# Patient Record
Sex: Male | Born: 1945 | Race: White | Hispanic: No | Marital: Married | Smoking: Current every day smoker
Health system: Southern US, Community
[De-identification: ages and names within clinical notes are randomized; demographics above are authoritative.]

## PROBLEM LIST (undated history)

## (undated) DIAGNOSIS — R160 Hepatomegaly, not elsewhere classified: Secondary | ICD-10-CM

## (undated) DIAGNOSIS — E785 Hyperlipidemia, unspecified: Secondary | ICD-10-CM

## (undated) DIAGNOSIS — F1910 Other psychoactive substance abuse, uncomplicated: Secondary | ICD-10-CM

## (undated) DIAGNOSIS — E119 Type 2 diabetes mellitus without complications: Secondary | ICD-10-CM

## (undated) DIAGNOSIS — I10 Essential (primary) hypertension: Secondary | ICD-10-CM

## (undated) HISTORY — DX: Other psychoactive substance abuse, uncomplicated: F19.10

## (undated) HISTORY — DX: Essential (primary) hypertension: I10

## (undated) HISTORY — DX: Hepatomegaly, not elsewhere classified: R16.0

## (undated) HISTORY — PX: CARDIAC SURGERY: SHX584

## (undated) HISTORY — DX: Type 2 diabetes mellitus without complications: E11.9

## (undated) HISTORY — DX: Hyperlipidemia, unspecified: E78.5

## (undated) HISTORY — PX: ANGIOPLASTY: SHX39

## (undated) HISTORY — PX: BONE MARROW TRANSPLANT: SHX200

## (undated) HISTORY — PX: APPENDECTOMY: SHX54

---

## 1980-05-05 HISTORY — PX: FRACTURE SURGERY: SHX138

## 2006-08-02 ENCOUNTER — Emergency Department: Payer: Self-pay | Admitting: Unknown Physician Specialty

## 2006-08-02 ENCOUNTER — Other Ambulatory Visit: Payer: Self-pay

## 2006-08-03 ENCOUNTER — Other Ambulatory Visit: Payer: Self-pay

## 2006-08-03 ENCOUNTER — Ambulatory Visit: Payer: Self-pay | Admitting: Unknown Physician Specialty

## 2008-12-05 ENCOUNTER — Emergency Department: Payer: Self-pay | Admitting: Emergency Medicine

## 2010-05-05 HISTORY — PX: COLONOSCOPY: SHX174

## 2010-10-11 ENCOUNTER — Ambulatory Visit: Payer: Self-pay | Admitting: Gastroenterology

## 2010-10-18 LAB — PATHOLOGY REPORT

## 2010-10-30 ENCOUNTER — Ambulatory Visit: Payer: Self-pay | Admitting: Gastroenterology

## 2013-12-27 ENCOUNTER — Ambulatory Visit: Payer: Self-pay | Admitting: Family Medicine

## 2014-06-05 DIAGNOSIS — Z125 Encounter for screening for malignant neoplasm of prostate: Secondary | ICD-10-CM | POA: Diagnosis not present

## 2014-06-05 DIAGNOSIS — Z0000 Encounter for general adult medical examination without abnormal findings: Secondary | ICD-10-CM | POA: Diagnosis not present

## 2014-06-05 DIAGNOSIS — I10 Essential (primary) hypertension: Secondary | ICD-10-CM | POA: Diagnosis not present

## 2014-06-05 DIAGNOSIS — E119 Type 2 diabetes mellitus without complications: Secondary | ICD-10-CM | POA: Diagnosis not present

## 2014-06-05 DIAGNOSIS — Z23 Encounter for immunization: Secondary | ICD-10-CM | POA: Diagnosis not present

## 2014-06-05 DIAGNOSIS — E785 Hyperlipidemia, unspecified: Secondary | ICD-10-CM | POA: Diagnosis not present

## 2014-10-23 ENCOUNTER — Other Ambulatory Visit: Payer: Self-pay | Admitting: Family Medicine

## 2014-11-20 DIAGNOSIS — E119 Type 2 diabetes mellitus without complications: Secondary | ICD-10-CM

## 2014-11-20 DIAGNOSIS — N183 Chronic kidney disease, stage 3 unspecified: Secondary | ICD-10-CM | POA: Insufficient documentation

## 2014-11-20 DIAGNOSIS — E1122 Type 2 diabetes mellitus with diabetic chronic kidney disease: Secondary | ICD-10-CM | POA: Insufficient documentation

## 2014-11-20 DIAGNOSIS — E785 Hyperlipidemia, unspecified: Secondary | ICD-10-CM | POA: Insufficient documentation

## 2014-11-20 DIAGNOSIS — I129 Hypertensive chronic kidney disease with stage 1 through stage 4 chronic kidney disease, or unspecified chronic kidney disease: Secondary | ICD-10-CM | POA: Insufficient documentation

## 2014-11-21 ENCOUNTER — Encounter: Payer: Self-pay | Admitting: Family Medicine

## 2014-11-21 ENCOUNTER — Ambulatory Visit (INDEPENDENT_AMBULATORY_CARE_PROVIDER_SITE_OTHER): Payer: Self-pay | Admitting: Family Medicine

## 2014-11-21 ENCOUNTER — Other Ambulatory Visit: Payer: Self-pay | Admitting: Family Medicine

## 2014-11-21 VITALS — BP 123/52 | HR 51 | Temp 98.2°F | Ht 70.0 in | Wt 182.0 lb

## 2014-11-21 DIAGNOSIS — E119 Type 2 diabetes mellitus without complications: Secondary | ICD-10-CM

## 2014-11-21 DIAGNOSIS — E785 Hyperlipidemia, unspecified: Secondary | ICD-10-CM | POA: Diagnosis not present

## 2014-11-21 DIAGNOSIS — I10 Essential (primary) hypertension: Secondary | ICD-10-CM

## 2014-11-21 DIAGNOSIS — Z0000 Encounter for general adult medical examination without abnormal findings: Secondary | ICD-10-CM | POA: Diagnosis not present

## 2014-11-21 MED ORDER — METFORMIN HCL 500 MG PO TABS: 250.0000 mg | ORAL_TABLET | Freq: Every day | ORAL | Status: DC

## 2014-11-21 MED ORDER — LISINOPRIL 40 MG PO TABS: 40.0000 mg | ORAL_TABLET | Freq: Every day | ORAL | Status: DC

## 2014-11-21 MED ORDER — METOPROLOL SUCCINATE ER 25 MG PO TB24: 25.0000 mg | ORAL_TABLET | Freq: Every day | ORAL | Status: DC

## 2014-11-21 MED ORDER — MELOXICAM 15 MG PO TABS: 15.0000 mg | ORAL_TABLET | Freq: Every day | ORAL | Status: DC

## 2014-11-21 MED ORDER — METFORMIN HCL ER (MOD) 500 MG PO TB24: 250.0000 mg | ORAL_TABLET | Freq: Every day | ORAL | Status: DC

## 2014-11-21 MED ORDER — CHLORTHALIDONE 25 MG PO TABS: 25.0000 mg | ORAL_TABLET | Freq: Every day | ORAL | Status: DC

## 2014-11-21 MED ORDER — AMLODIPINE BESYLATE 10 MG PO TABS: 10.0000 mg | ORAL_TABLET | Freq: Every day | ORAL | Status: DC

## 2014-11-21 NOTE — Assessment & Plan Note (Signed)
The current medical regimen is effective;  continue present plan and medications.  

## 2014-11-21 NOTE — Progress Notes (Signed)
BP 123/52 mmHg  Pulse 51  Temp(Src) 98.2 F (36.8 C)  Ht 5\' 10"  (1.778 m)  Wt 182 lb (82.555 kg)  BMI 26.11 kg/m2  SpO2 95%   Subjective:    Patient ID: William Ball, male    DOB: 19-Apr-1946, 69 y.o.   MRN: [id number]  HPI: William Ball is a 69 y.o. male  Chief Complaint  Patient presents with  . Diabetes  . Hypertension   patient recheck diabetes hypertension doing well no low blood sugar spells no issues with medications taking medications faithfully. Also checking lipids today A she is not on any medications Meloxicam doing okay for arthritis  Relevant past medical, surgical, family and social history reviewed and updated as indicated. Interim medical history since our last visit reviewed. Allergies and medications reviewed and updated.  Review of Systems  Constitutional: Negative.   Respiratory: Negative.   Cardiovascular: Negative.     Per HPI unless specifically indicated above     Objective:    BP 123/52 mmHg  Pulse 51  Temp(Src) 98.2 F (36.8 C)  Ht 5\' 10"  (1.778 m)  Wt 182 lb (82.555 kg)  BMI 26.11 kg/m2  SpO2 95%  Wt Readings from Last 3 Encounters:  11/21/14 182 lb (82.555 kg)  06/05/14 179 lb (81.194 kg)    Physical Exam  Constitutional: He is oriented to person, place, and time. He appears well-developed and well-nourished. No distress.  HENT:  Head: Normocephalic and atraumatic.  Right Ear: Hearing normal.  Left Ear: Hearing normal.  Nose: Nose normal.  Eyes: Conjunctivae and lids are normal. Right eye exhibits no discharge. Left eye exhibits no discharge. No scleral icterus.  Cardiovascular: Normal rate, regular rhythm and normal heart sounds.   Pulmonary/Chest: Effort normal and breath sounds normal. No respiratory distress.  Musculoskeletal: Normal range of motion.  Neurological: He is alert and oriented to person, place, and time.  Skin: Skin is intact. No rash noted.  Psychiatric: He has a normal mood and affect. His speech is  normal and behavior is normal. Judgment and thought content normal. Cognition and memory are normal.        Assessment & Plan:   Problem List Items Addressed This Visit      Cardiovascular and Mediastinum   Hypertension    The current medical regimen is effective;  continue present plan and medications.       Relevant Medications   amLODipine (NORVASC) 10 MG tablet   chlorthalidone (HYGROTON) 25 MG tablet   lisinopril (PRINIVIL,ZESTRIL) 40 MG tablet   metoprolol succinate (TOPROL-XL) 25 MG 24 hr tablet     Endocrine   Diabetes mellitus without complication - Primary    .....Marland KitchenMarland KitchenThe current medical regimen is effective;  continue present plan and medications.       Relevant Medications   lisinopril (PRINIVIL,ZESTRIL) 40 MG tablet   metFORMIN (GLUMETZA) 500 MG (MOD) 24 hr tablet   Other Relevant Orders   Bayer DCA Hb A1c Waived   Bayer DCA Hb A1c Waived     Other   Hyperlipidemia    The current medical regimen is effective;  continue present plan and medications.       Relevant Medications   amLODipine (NORVASC) 10 MG tablet   chlorthalidone (HYGROTON) 25 MG tablet   lisinopril (PRINIVIL,ZESTRIL) 40 MG tablet   metoprolol succinate (TOPROL-XL) 25 MG 24 hr tablet    Other Visit Diagnoses    Essential hypertension, benign  Relevant Medications    amLODipine (NORVASC) 10 MG tablet    chlorthalidone (HYGROTON) 25 MG tablet    lisinopril (PRINIVIL,ZESTRIL) 40 MG tablet    metoprolol succinate (TOPROL-XL) 25 MG 24 hr tablet    Other Relevant Orders    Basic metabolic panel    Hyperlipemia        Relevant Medications    amLODipine (NORVASC) 10 MG tablet    chlorthalidone (HYGROTON) 25 MG tablet    lisinopril (PRINIVIL,ZESTRIL) 40 MG tablet    metoprolol succinate (TOPROL-XL) 25 MG 24 hr tablet    Other Relevant Orders    Lipid Panel Piccolo, Waived    PE (physical exam), annual        Relevant Orders    Comprehensive metabolic panel    CBC with  Differential/Platelet    Urinalysis, Routine w reflex microscopic (not at [facility])    TSH    PSA    Lipid panel        Follow up plan: Return in about 6 months (around 05/24/2015) for Physical Exam.

## 2014-11-22 LAB — BASIC METABOLIC PANEL WITH GFR
BUN/Creatinine Ratio: 27 — ABNORMAL HIGH (ref 10–22)
BUN: 28 mg/dL — ABNORMAL HIGH (ref 8–27)
CO2: 21 mmol/L (ref 18–29)
Calcium: 9.9 mg/dL (ref 8.6–10.2)
Chloride: 101 mmol/L (ref 97–108)
Creatinine, Ser: 1.05 mg/dL (ref 0.76–1.27)
GFR calc Af Amer: 83 mL/min/1.73
GFR calc non Af Amer: 72 mL/min/1.73
Glucose: 147 mg/dL — ABNORMAL HIGH (ref 65–99)
Potassium: 4.5 mmol/L (ref 3.5–5.2)
Sodium: 140 mmol/L (ref 134–144)

## 2014-11-22 LAB — BAYER DCA HB A1C WAIVED: HB A1C (BAYER DCA - WAIVED): 6.4 %

## 2014-11-22 LAB — LIPID PANEL W/O CHOL/HDL RATIO
Cholesterol, Total: 150 mg/dL (ref 100–199)
HDL: 30 mg/dL — ABNORMAL LOW
LDL Calculated: 41 mg/dL (ref 0–99)
Triglycerides: 393 mg/dL — ABNORMAL HIGH (ref 0–149)
VLDL Cholesterol Cal: 79 mg/dL — ABNORMAL HIGH (ref 5–40)

## 2015-01-14 DIAGNOSIS — K469 Unspecified abdominal hernia without obstruction or gangrene: Secondary | ICD-10-CM | POA: Diagnosis not present

## 2015-01-14 DIAGNOSIS — R109 Unspecified abdominal pain: Secondary | ICD-10-CM | POA: Diagnosis not present

## 2015-01-22 ENCOUNTER — Ambulatory Visit (INDEPENDENT_AMBULATORY_CARE_PROVIDER_SITE_OTHER): Payer: Medicare Other | Admitting: Family Medicine

## 2015-01-22 ENCOUNTER — Encounter: Payer: Self-pay | Admitting: Family Medicine

## 2015-01-22 VITALS — BP 135/58 | HR 55 | Temp 98.2°F | Ht 70.0 in | Wt 178.0 lb

## 2015-01-22 DIAGNOSIS — K219 Gastro-esophageal reflux disease without esophagitis: Secondary | ICD-10-CM | POA: Diagnosis not present

## 2015-01-22 NOTE — Assessment & Plan Note (Signed)
Discuss with relief of symptoms with Prilosec and reflux is his diagnosis. Patient though may have some gallstone issues will observe for further abdominal pain complaints Discuss ventral hernia to leave it alone We'll continue Prilosec

## 2015-01-22 NOTE — Progress Notes (Signed)
BP 135/58 mmHg  Pulse 55  Temp(Src) 98.2 F (36.8 C)  Ht 5\' 10"  (1.778 m)  Wt 178 lb (80.74 kg)  BMI 25.54 kg/m2  SpO2 96%   Subjective:    Patient ID: William Ball, male    DOB: 02/06/1946, 69 y.o.   MRN: [id number]  HPI: William Ball is a 69 y.o. male  Chief Complaint  Patient presents with  . Hernia    Rt side    Patient for follow-up from fast med office visit for right upper quadrant abdominal pain is treated with Prilosec and is significantly better with no further symptoms. Reviewed patient's notes from past med indicating also a hernia. Patient with no nausea vomiting diarrhea no blood in stool or urine. Is feeling well at this point    Relevant past medical, surgical, family and social history reviewed and updated as indicated. Interim medical history since our last visit reviewed. Allergies and medications reviewed and updated.  Review of Systems  Constitutional: Negative.   Respiratory: Negative.   Cardiovascular: Negative.   Gastrointestinal: Negative for nausea, abdominal pain, diarrhea, constipation, blood in stool, abdominal distention and anal bleeding.    Per HPI unless specifically indicated above     Objective:    BP 135/58 mmHg  Pulse 55  Temp(Src) 98.2 F (36.8 C)  Ht 5\' 10"  (1.778 m)  Wt 178 lb (80.74 kg)  BMI 25.54 kg/m2  SpO2 96%  Wt Readings from Last 3 Encounters:  01/22/15 178 lb (80.74 kg)  11/21/14 182 lb (82.555 kg)  06/05/14 179 lb (81.194 kg)    Physical Exam  Constitutional: He is oriented to person, place, and time. He appears well-developed and well-nourished. No distress.  HENT:  Head: Normocephalic and atraumatic.  Right Ear: Hearing normal.  Left Ear: Hearing normal.  Nose: Nose normal.  Eyes: Conjunctivae and lids are normal. Right eye exhibits no discharge. Left eye exhibits no discharge. No scleral icterus.  Cardiovascular: Normal rate, regular rhythm and normal heart sounds.   Pulmonary/Chest: Effort normal  and breath sounds normal. No respiratory distress.  Abdominal: Soft. Bowel sounds are normal. He exhibits no distension and no mass. There is no tenderness. There is no rebound and no guarding.  Ventral hernia present painless only present with Valsalva maneuver  Musculoskeletal: Normal range of motion.  Neurological: He is alert and oriented to person, place, and time.  Skin: Skin is intact. No rash noted.  Psychiatric: He has a normal mood and affect. His speech is normal and behavior is normal. Judgment and thought content normal. Cognition and memory are normal.    Results for orders placed or performed in visit on 32/95/18  Basic metabolic panel  Result Value Ref Range   Glucose 147 (H) 65 - 99 mg/dL   BUN 28 (H) 8 - 27 mg/dL   Creatinine, Ser 1.05 0.76 - 1.27 mg/dL   GFR calc non Af Amer 72 >59 mL/min/1.73   GFR calc Af Amer 83 >59 mL/min/1.73   BUN/Creatinine Ratio 27 (H) 10 - 22   Sodium 140 134 - 144 mmol/L   Potassium 4.5 3.5 - 5.2 mmol/L   Chloride 101 97 - 108 mmol/L   CO2 21 18 - 29 mmol/L   Calcium 9.9 8.6 - 10.2 mg/dL  Bayer DCA Hb A1c Waived  Result Value Ref Range   Bayer DCA Hb A1c Waived 6.4 <7.0 %      Assessment & Plan:   Problem List Items Addressed This  Visit      Digestive   Gastroesophageal reflux - Primary    Discuss with relief of symptoms with Prilosec and reflux is his diagnosis. Patient though may have some gallstone issues will observe for further abdominal pain complaints Discuss ventral hernia to leave it alone We'll continue Prilosec          Follow up plan: Return if symptoms worsen or fail to improve, for For regularly scheduled visit.

## 2015-06-07 ENCOUNTER — Ambulatory Visit (INDEPENDENT_AMBULATORY_CARE_PROVIDER_SITE_OTHER): Payer: PPO | Admitting: Family Medicine

## 2015-06-07 ENCOUNTER — Encounter: Payer: Self-pay | Admitting: Family Medicine

## 2015-06-07 VITALS — BP 138/64 | HR 45 | Temp 97.7°F | Ht 69.4 in | Wt 176.0 lb

## 2015-06-07 DIAGNOSIS — M1610 Unilateral primary osteoarthritis, unspecified hip: Secondary | ICD-10-CM

## 2015-06-07 DIAGNOSIS — E119 Type 2 diabetes mellitus without complications: Secondary | ICD-10-CM | POA: Diagnosis not present

## 2015-06-07 DIAGNOSIS — Z0000 Encounter for general adult medical examination without abnormal findings: Secondary | ICD-10-CM

## 2015-06-07 DIAGNOSIS — I10 Essential (primary) hypertension: Secondary | ICD-10-CM

## 2015-06-07 DIAGNOSIS — N400 Enlarged prostate without lower urinary tract symptoms: Secondary | ICD-10-CM | POA: Insufficient documentation

## 2015-06-07 DIAGNOSIS — E785 Hyperlipidemia, unspecified: Secondary | ICD-10-CM | POA: Diagnosis not present

## 2015-06-07 DIAGNOSIS — Z113 Encounter for screening for infections with a predominantly sexual mode of transmission: Secondary | ICD-10-CM

## 2015-06-07 MED ORDER — AMLODIPINE BESYLATE 10 MG PO TABS: 10.0000 mg | ORAL_TABLET | Freq: Every day | ORAL | Status: DC

## 2015-06-07 MED ORDER — METOPROLOL SUCCINATE ER 25 MG PO TB24: 25.0000 mg | ORAL_TABLET | Freq: Every day | ORAL | Status: DC

## 2015-06-07 MED ORDER — LISINOPRIL 40 MG PO TABS: 40.0000 mg | ORAL_TABLET | Freq: Every day | ORAL | Status: DC

## 2015-06-07 MED ORDER — MELOXICAM 15 MG PO TABS: 15.0000 mg | ORAL_TABLET | Freq: Every day | ORAL | Status: DC

## 2015-06-07 MED ORDER — METFORMIN HCL 500 MG PO TABS: 250.0000 mg | ORAL_TABLET | Freq: Every day | ORAL | Status: DC

## 2015-06-07 NOTE — Assessment & Plan Note (Signed)
The current medical regimen is effective;  continue present plan and medications.  

## 2015-06-07 NOTE — Assessment & Plan Note (Signed)
The current medical regimen is effective;  continue present plan and medications. Discussed occasional holidays

## 2015-06-07 NOTE — Progress Notes (Signed)
BP 138/64 mmHg  Pulse 45  Temp(Src) 97.7 F (36.5 C)  Ht 5' 9.4" (1.763 m)  Wt 176 lb (79.833 kg)  BMI 25.68 kg/m2  SpO2 97%   Subjective:    Patient ID: William Ball, male    DOB: [birth year], 70 y.o.   MRN: [id number]  HPI: William Ball is a 70 y.o. male  Chief Complaint  Patient presents with  . Annual Exam   Patient doing well with blood pressure no complaints from medications having good control Blood sugar doing well with no low blood sugar spells no issues from metformin Patient with bilateral hip pain discomfort relieved by taking meloxicam which he takes every day no stomach distress or other issues. Relevant past medical, surgical, family and social history reviewed and updated as indicated. Interim medical history since our last visit reviewed. Allergies and medications reviewed and updated.  Review of Systems  Constitutional: Negative.   HENT: Negative.   Eyes: Negative.   Respiratory: Negative.   Cardiovascular: Negative.   Gastrointestinal: Negative.   Endocrine: Negative.   Genitourinary: Negative.   Musculoskeletal: Negative.   Skin: Negative.   Allergic/Immunologic: Negative.   Neurological: Negative.   Hematological: Negative.   Psychiatric/Behavioral: Negative.     Per HPI unless specifically indicated above     Objective:    BP 138/64 mmHg  Pulse 45  Temp(Src) 97.7 F (36.5 C)  Ht 5' 9.4" (1.763 m)  Wt 176 lb (79.833 kg)  BMI 25.68 kg/m2  SpO2 97%  Wt Readings from Last 3 Encounters:  06/07/15 176 lb (79.833 kg)  01/22/15 178 lb (80.74 kg)  11/21/14 182 lb (82.555 kg)    Physical Exam  Constitutional: He is oriented to person, place, and time. He appears well-developed and well-nourished.  HENT:  Head: Normocephalic and atraumatic.  Right Ear: External ear normal.  Left Ear: External ear normal.  Eyes: Conjunctivae and EOM are normal. Pupils are equal, round, and reactive to light.  Neck: Normal range of motion. Neck supple.   Cardiovascular: Normal rate, regular rhythm, normal heart sounds and intact distal pulses.   Pulmonary/Chest: Effort normal and breath sounds normal.  Abdominal: Soft. Bowel sounds are normal. There is no splenomegaly or hepatomegaly.  Genitourinary: Rectum normal and penis normal.  Enlarged prostate  Musculoskeletal: Normal range of motion.  Neurological: He is alert and oriented to person, place, and time. He has normal reflexes.  Skin: No rash noted. No erythema.  Psychiatric: He has a normal mood and affect. His behavior is normal. Judgment and thought content normal.    Results for orders placed or performed in visit on [id number]  Basic metabolic panel  Result Value Ref Range   Glucose 147 (H) 65 - 99 mg/dL   BUN 28 (H) 8 - 27 mg/dL   Creatinine, Ser 1.05 0.76 - 1.27 mg/dL   GFR calc non Af Amer 72 >59 mL/min/1.73   GFR calc Af Amer 83 >59 mL/min/1.73   BUN/Creatinine Ratio 27 (H) 10 - 22   Sodium 140 134 - 144 mmol/L   Potassium 4.5 3.5 - 5.2 mmol/L   Chloride 101 97 - 108 mmol/L   CO2 21 18 - 29 mmol/L   Calcium 9.9 8.6 - 10.2 mg/dL  Bayer DCA Hb A1c Waived  Result Value Ref Range   Bayer DCA Hb A1c Waived 6.4 <7.0 %      Assessment & Plan:   Problem List Items Addressed This Visit  Cardiovascular and Mediastinum   Hypertension    The current medical regimen is effective;  continue present plan and medications.       Relevant Medications   amLODipine (NORVASC) 10 MG tablet   lisinopril (PRINIVIL,ZESTRIL) 40 MG tablet   metoprolol succinate (TOPROL-XL) 25 MG 24 hr tablet     Endocrine   Diabetes mellitus without complication (HCC)    The current medical regimen is effective;  continue present plan and medications.       Relevant Medications   lisinopril (PRINIVIL,ZESTRIL) 40 MG tablet   metFORMIN (GLUCOPHAGE) 500 MG tablet   Other Relevant Orders   Bayer DCA Hb A1c Waived   Comprehensive metabolic panel   CBC with Differential/Platelet    Urinalysis, Routine w reflex microscopic (not at [facility])   TSH     Musculoskeletal and Integument   Hip arthritis    The current medical regimen is effective;  continue present plan and medications. Discussed occasional holidays      Relevant Medications   meloxicam (MOBIC) 15 MG tablet   Other Relevant Orders   TSH     Genitourinary   BPH (benign prostatic hyperplasia)   Relevant Orders   PSA     Other   Hyperlipidemia    The current medical regimen is effective;  continue present plan and medications.       Relevant Medications   amLODipine (NORVASC) 10 MG tablet   lisinopril (PRINIVIL,ZESTRIL) 40 MG tablet   metoprolol succinate (TOPROL-XL) 25 MG 24 hr tablet   Other Relevant Orders   Comprehensive metabolic panel   Lipid panel   CBC with Differential/Platelet   Urinalysis, Routine w reflex microscopic (not at [facility])   TSH    Other Visit Diagnoses    Routine screening for STI (sexually transmitted infection)    -  Primary    Relevant Orders    Hepatitis C Antibody    PE (physical exam), annual        Essential hypertension, benign        Relevant Medications    amLODipine (NORVASC) 10 MG tablet    lisinopril (PRINIVIL,ZESTRIL) 40 MG tablet    metoprolol succinate (TOPROL-XL) 25 MG 24 hr tablet    Other Relevant Orders    Comprehensive metabolic panel    CBC with Differential/Platelet    Urinalysis, Routine w reflex microscopic (not at [facility])    TSH        Follow up plan: Return in about 6 months (around 12/05/2015), or if symptoms worsen or fail to improve, for bmp and A1C.

## 2015-06-08 LAB — CBC WITH DIFFERENTIAL/PLATELET
Basophils Absolute: 0.1 x10E3/uL (ref 0.0–0.2)
Basos: 1 %
EOS (ABSOLUTE): 0.4 x10E3/uL (ref 0.0–0.4)
Eos: 6 %
Hematocrit: 43.2 % (ref 37.5–51.0)
Hemoglobin: 14.7 g/dL (ref 12.6–17.7)
Immature Grans (Abs): 0 x10E3/uL (ref 0.0–0.1)
Immature Granulocytes: 0 %
Lymphocytes Absolute: 2.9 x10E3/uL (ref 0.7–3.1)
Lymphs: 42 %
MCH: 31 pg (ref 26.6–33.0)
MCHC: 34 g/dL (ref 31.5–35.7)
MCV: 91 fL (ref 79–97)
Monocytes Absolute: 0.6 x10E3/uL (ref 0.1–0.9)
Monocytes: 9 %
Neutrophils Absolute: 2.9 x10E3/uL (ref 1.4–7.0)
Neutrophils: 42 %
Platelets: 281 x10E3/uL (ref 150–379)
RBC: 4.74 x10E6/uL (ref 4.14–5.80)
RDW: 13.3 % (ref 12.3–15.4)
WBC: 6.8 x10E3/uL (ref 3.4–10.8)

## 2015-06-08 LAB — URINALYSIS, ROUTINE W REFLEX MICROSCOPIC
Bilirubin, UA: NEGATIVE
Glucose, UA: NEGATIVE
Ketones, UA: NEGATIVE
Leukocytes, UA: NEGATIVE
Nitrite, UA: NEGATIVE
Protein, UA: NEGATIVE
RBC, UA: NEGATIVE
Specific Gravity, UA: 1.01 (ref 1.005–1.030)
Urobilinogen, Ur: 0.2 mg/dL (ref 0.2–1.0)
pH, UA: 7 (ref 5.0–7.5)

## 2015-06-08 LAB — COMPREHENSIVE METABOLIC PANEL WITH GFR
ALT: 48 IU/L — ABNORMAL HIGH (ref 0–44)
AST: 33 IU/L (ref 0–40)
Albumin/Globulin Ratio: 1.8 (ref 1.1–2.5)
Albumin: 4.7 g/dL (ref 3.5–4.8)
Alkaline Phosphatase: 75 IU/L (ref 39–117)
BUN/Creatinine Ratio: 20 (ref 10–22)
BUN: 23 mg/dL (ref 8–27)
Bilirubin Total: 0.4 mg/dL (ref 0.0–1.2)
CO2: 21 mmol/L (ref 18–29)
Calcium: 9.7 mg/dL (ref 8.6–10.2)
Chloride: 99 mmol/L (ref 96–106)
Creatinine, Ser: 1.13 mg/dL (ref 0.76–1.27)
GFR calc Af Amer: 76 mL/min/1.73
GFR calc non Af Amer: 65 mL/min/1.73
Globulin, Total: 2.6 g/dL (ref 1.5–4.5)
Glucose: 83 mg/dL (ref 65–99)
Potassium: 5 mmol/L (ref 3.5–5.2)
Sodium: 139 mmol/L (ref 134–144)
Total Protein: 7.3 g/dL (ref 6.0–8.5)

## 2015-06-08 LAB — LIPID PANEL
Chol/HDL Ratio: 3.9 ratio (ref 0.0–5.0)
Cholesterol, Total: 155 mg/dL (ref 100–199)
HDL: 40 mg/dL
LDL Calculated: 84 mg/dL (ref 0–99)
Triglycerides: 155 mg/dL — ABNORMAL HIGH (ref 0–149)
VLDL Cholesterol Cal: 31 mg/dL (ref 5–40)

## 2015-06-08 LAB — PSA: Prostate Specific Ag, Serum: 0.6 ng/mL (ref 0.0–4.0)

## 2015-06-08 LAB — HEPATITIS C ANTIBODY: Hep C Virus Ab: <0.1 {s_co_ratio} (ref 0.0–0.9)

## 2015-06-08 LAB — BAYER DCA HB A1C WAIVED: HB A1C (BAYER DCA - WAIVED): 6.5 %

## 2015-06-08 LAB — TSH: TSH: 4.51 u[IU]/mL — ABNORMAL HIGH (ref 0.450–4.500)

## 2015-06-11 ENCOUNTER — Encounter: Payer: Self-pay | Admitting: Family Medicine

## 2015-06-12 ENCOUNTER — Other Ambulatory Visit: Payer: Self-pay | Admitting: Family Medicine

## 2015-07-03 ENCOUNTER — Other Ambulatory Visit: Payer: Self-pay | Admitting: Family Medicine

## 2015-07-03 ENCOUNTER — Telehealth: Payer: Self-pay

## 2015-07-03 DIAGNOSIS — E119 Type 2 diabetes mellitus without complications: Secondary | ICD-10-CM

## 2015-07-03 MED ORDER — METFORMIN HCL ER 500 MG PO TB24: 250.0000 mg | ORAL_TABLET | Freq: Every day | ORAL | Status: DC

## 2015-07-03 NOTE — Telephone Encounter (Signed)
Apt in 6 mo

## 2015-07-03 NOTE — Telephone Encounter (Signed)
Pharmacy states historically patient has been on Metformin XR, please verify you wanted to change to regular Metformin  [facility]

## 2015-07-23 ENCOUNTER — Encounter: Payer: Self-pay | Admitting: Family Medicine

## 2015-07-23 NOTE — Telephone Encounter (Signed)
Letter sent for pt to call and schedule appt in September 2017.

## 2015-09-03 LAB — HM DIABETES EYE EXAM

## 2015-10-03 ENCOUNTER — Other Ambulatory Visit: Payer: Self-pay | Admitting: Family Medicine

## 2015-10-21 ENCOUNTER — Other Ambulatory Visit: Payer: Self-pay | Admitting: Family Medicine

## 2015-11-13 ENCOUNTER — Encounter: Payer: Self-pay | Admitting: *Deleted

## 2015-11-13 ENCOUNTER — Encounter
Admission: RE | Disposition: A | Discharge status: Home / Self Care | Payer: Self-pay | Source: Ambulatory Visit | Attending: Ophthalmology

## 2015-11-13 ENCOUNTER — Ambulatory Visit
Admission: RE | Admit: 2015-11-13 | Discharge: 2015-11-13 | Disposition: A | Discharge status: Home / Self Care | Payer: PPO | Source: Ambulatory Visit | Attending: Ophthalmology | Admitting: Ophthalmology

## 2015-11-13 ENCOUNTER — Ambulatory Visit: Payer: PPO | Admitting: Anesthesiology

## 2015-11-13 DIAGNOSIS — F17200 Nicotine dependence, unspecified, uncomplicated: Secondary | ICD-10-CM | POA: Insufficient documentation

## 2015-11-13 DIAGNOSIS — K219 Gastro-esophageal reflux disease without esophagitis: Secondary | ICD-10-CM | POA: Insufficient documentation

## 2015-11-13 DIAGNOSIS — E119 Type 2 diabetes mellitus without complications: Secondary | ICD-10-CM | POA: Diagnosis not present

## 2015-11-13 DIAGNOSIS — M1990 Unspecified osteoarthritis, unspecified site: Secondary | ICD-10-CM | POA: Diagnosis not present

## 2015-11-13 DIAGNOSIS — R05 Cough: Secondary | ICD-10-CM | POA: Insufficient documentation

## 2015-11-13 DIAGNOSIS — H2512 Age-related nuclear cataract, left eye: Secondary | ICD-10-CM | POA: Insufficient documentation

## 2015-11-13 HISTORY — PX: CATARACT EXTRACTION W/PHACO: SHX586

## 2015-11-13 LAB — GLUCOSE, CAPILLARY: Glucose-Capillary: 151 mg/dL — ABNORMAL HIGH (ref 65–99)

## 2015-11-13 SURGERY — CATARACT EXTRACTION PHACO AND INTRAOCULAR LENS PLACEMENT (IOC)
Anesthesia: Monitor Anesthesia Care | Site: Eye | Laterality: Left | Wound class: Clean

## 2015-11-13 MED ORDER — CARBACHOL 0.01 % IO SOLN
INTRAOCULAR | Status: DC | PRN
  Administered 2015-11-13: 0.5 mL via INTRAOCULAR

## 2015-11-13 MED ORDER — NA CHONDROIT SULF-NA HYALURON 40-17 MG/ML IO SOLN
INTRAOCULAR | Status: DC | PRN
  Administered 2015-11-13: 1 mL via INTRAOCULAR

## 2015-11-13 MED ORDER — MOXIFLOXACIN HCL 0.5 % OP SOLN: 1.0000 [drp] | OPHTHALMIC | Status: DC | PRN

## 2015-11-13 MED ORDER — ARMC OPHTHALMIC DILATING GEL
1.0000 "application " | OPHTHALMIC | Status: DC | PRN
  Administered 2015-11-13: 1 via OPHTHALMIC

## 2015-11-13 MED ORDER — EPINEPHRINE HCL 1 MG/ML IJ SOLN
INTRAMUSCULAR | Status: AC
  Filled 2015-11-13: qty 1

## 2015-11-13 MED ORDER — EPINEPHRINE HCL 1 MG/ML IJ SOLN
INTRAOCULAR | Status: DC | PRN
  Administered 2015-11-13: 07:00:00 via OPHTHALMIC

## 2015-11-13 MED ORDER — CEFUROXIME OPHTHALMIC INJECTION 1 MG/0.1 ML
INJECTION | OPHTHALMIC | Status: DC | PRN
  Administered 2015-11-13: 0.1 mL via INTRACAMERAL

## 2015-11-13 MED ORDER — FENTANYL CITRATE (PF) 100 MCG/2ML IJ SOLN
INTRAMUSCULAR | Status: DC | PRN
  Administered 2015-11-13: 50 ug via INTRAVENOUS

## 2015-11-13 MED ORDER — POVIDONE-IODINE 5 % OP SOLN
1.0000 "application " | Freq: Once | OPHTHALMIC | Status: AC
  Administered 2015-11-13: 1 via OPHTHALMIC

## 2015-11-13 MED ORDER — NA CHONDROIT SULF-NA HYALURON 40-17 MG/ML IO SOLN
INTRAOCULAR | Status: AC
  Filled 2015-11-13: qty 1

## 2015-11-13 MED ORDER — SODIUM CHLORIDE 0.9 % IV SOLN
INTRAVENOUS | Status: DC
  Administered 2015-11-13: 07:00:00 via INTRAVENOUS
  Administered 2015-11-13: 50 mL/h via INTRAVENOUS

## 2015-11-13 MED ORDER — FENTANYL CITRATE (PF) 100 MCG/2ML IJ SOLN: 25.0000 ug | INTRAMUSCULAR | Status: DC | PRN

## 2015-11-13 MED ORDER — CEFUROXIME OPHTHALMIC INJECTION 1 MG/0.1 ML
INJECTION | OPHTHALMIC | Status: AC
  Filled 2015-11-13: qty 0.1

## 2015-11-13 MED ORDER — ONDANSETRON HCL 4 MG/2ML IJ SOLN: 4.0000 mg | Freq: Once | INTRAMUSCULAR | Status: DC | PRN

## 2015-11-13 MED ORDER — MOXIFLOXACIN HCL 0.5 % OP SOLN
OPHTHALMIC | Status: DC | PRN
  Administered 2015-11-13: 1 [drp] via OPHTHALMIC

## 2015-11-13 MED ORDER — MIDAZOLAM HCL 2 MG/2ML IJ SOLN
INTRAMUSCULAR | Status: DC | PRN
  Administered 2015-11-13 (×2): 1 mg via INTRAVENOUS

## 2015-11-13 MED ORDER — POVIDONE-IODINE 5 % OP SOLN
OPHTHALMIC | Status: AC
  Administered 2015-11-13: 1 "application " via OPHTHALMIC
  Filled 2015-11-13: qty 30

## 2015-11-13 MED ORDER — ARMC OPHTHALMIC DILATING GEL
OPHTHALMIC | Status: AC
  Administered 2015-11-13: 1 "application " via OPHTHALMIC
  Filled 2015-11-13: qty 0.25

## 2015-11-13 MED ORDER — MOXIFLOXACIN HCL 0.5 % OP SOLN
OPHTHALMIC | Status: AC
  Filled 2015-11-13: qty 3

## 2015-11-13 MED ORDER — TETRACAINE HCL 0.5 % OP SOLN
OPHTHALMIC | Status: AC
  Administered 2015-11-13: 1 [drp] via OPHTHALMIC
  Filled 2015-11-13: qty 2

## 2015-11-13 MED ORDER — TETRACAINE HCL 0.5 % OP SOLN
1.0000 [drp] | Freq: Once | OPHTHALMIC | Status: AC
  Administered 2015-11-13: 1 [drp] via OPHTHALMIC

## 2015-11-13 SURGICAL SUPPLY — 19 items
CANNULA ANT/CHMB 27GA (MISCELLANEOUS) ×2 IMPLANT
CUP MEDICINE 2OZ PLAST GRAD ST (MISCELLANEOUS) ×2
GLOVE BIO SURGEON STRL SZ8 (GLOVE) ×2
GLOVE BIOGEL M 6.5 STRL (GLOVE) ×2
GLOVE SURG LX 8.0 MICRO (GLOVE) ×1
GLOVE SURG LX STRL 8.0 MICRO (GLOVE) ×1
GOWN STRL REUS W/ TWL LRG LVL3 (GOWN DISPOSABLE) ×2
GOWN STRL REUS W/TWL LRG LVL3 (GOWN DISPOSABLE) ×2
LENS IOL TECNIS ITEC 21.0 (Intraocular Lens) ×2 IMPLANT
PACK CATARACT (MISCELLANEOUS) ×2
PACK CATARACT BRASINGTON LX (MISCELLANEOUS) ×2
PACK EYE AFTER SURG (MISCELLANEOUS) ×2
SOL BSS BAG (MISCELLANEOUS) ×2
SOL PREP PVP 2OZ (MISCELLANEOUS) ×2
SYR 3ML LL SCALE MARK (SYRINGE) ×2
SYR 5ML LL (SYRINGE) ×2
SYR TB 1ML 27GX1/2 LL (SYRINGE) ×2
WATER STERILE IRR 1000ML POUR (IV SOLUTION) ×2
WIPE NON LINTING 3.25X3.25 (MISCELLANEOUS) ×2

## 2015-11-13 NOTE — Anesthesia Preprocedure Evaluation (Signed)
Anesthesia Evaluation  Patient identified by MRN, date of birth, ID band Patient awake    Reviewed: Allergy & Precautions, NPO status , Patient's Chart, lab work & pertinent test results  Airway Mallampati: II  TM Distance: >3 FB     Dental   Pulmonary Current Smoker,    Pulmonary exam normal        Cardiovascular hypertension, Pt. on medications Normal cardiovascular exam     Neuro/Psych negative neurological ROS  negative psych ROS   GI/Hepatic GERD  Medicated,  Endo/Other  diabetes, Well Controlled, Type 2  Renal/GU   negative genitourinary   Musculoskeletal  (+) Arthritis , Osteoarthritis,    Abdominal Normal abdominal exam  (+)   Peds negative pediatric ROS (+)  Hematology negative hematology ROS (+)   Anesthesia Other Findings   Reproductive/Obstetrics                             Anesthesia Physical Anesthesia Plan  ASA: II  Anesthesia Plan: MAC   Post-op Pain Management:    Induction: Intravenous  Airway Management Planned: Nasal Cannula  Additional Equipment:   Intra-op Plan:   Post-operative Plan:   Informed Consent: I have reviewed the patients History and Physical, chart, labs and discussed the procedure including the risks, benefits and alternatives for the proposed anesthesia with the patient or authorized representative who has indicated his/her understanding and acceptance.   Dental advisory given  Plan Discussed with: CRNA and Surgeon  Anesthesia Plan Comments:         Anesthesia Quick Evaluation

## 2015-11-13 NOTE — Transfer of Care (Signed)
Immediate Anesthesia Transfer of Care Note  Patient: William Ball  Procedure(s) Performed: Procedure(s) with comments: CATARACT EXTRACTION PHACO AND INTRAOCULAR LENS PLACEMENT (IOC) (Left) - [place] 1.06 AP% 23.9 CDE 15.81 Fluid pack lot # [id number]  Patient Location: PACU  Anesthesia Type:MAC  Level of Consciousness: awake, alert  and oriented  Airway & Oxygen Therapy: Patient Spontanous Breathing  Post-op Assessment: Report given to RN and Post -op Vital signs reviewed and stable  Post vital signs: Reviewed and stable  Last Vitals:  Filed Vitals:   11/13/15 0615  BP: 162/72  Pulse: 51  Temp: 35.6 C  Resp: 18    Last Pain: There were no vitals filed for this visit.    Patients Stated Pain Goal: 0 ([id number])  Complications: No apparent anesthesia complications

## 2015-11-13 NOTE — H&P (Signed)
  All labs reviewed. Abnormal studies sent to patients PCP when indicated.  Previous H&P reviewed, patient examined, there are NO CHANGES.  William Ball LOUIS7/11/20177:18 AM

## 2015-11-13 NOTE — Op Note (Signed)
PREOPERATIVE DIAGNOSIS:  Nuclear sclerotic cataract of the left eye.   POSTOPERATIVE DIAGNOSIS:  NUCLEAR SCLEROTIC CATARACT LEFT EYE   OPERATIVE PROCEDURE:  Procedure(s): CATARACT EXTRACTION PHACO AND INTRAOCULAR LENS PLACEMENT (IOC)   SURGEON:  [name], MD.   ANESTHESIA:   Anesthesiologist: Alvin Critchley, MD CRNA: Jenetta Downer, CRNA  1.      Managed anesthesia care. 2.      Topical tetracaine drops followed by 2% Xylocaine jelly applied in the preoperative holding area.   COMPLICATIONS:  None.   TECHNIQUE:   Stop and chop   DESCRIPTION OF PROCEDURE:  The patient was examined and consented in the preoperative holding area where the aforementioned topical anesthesia was applied to the left eye and then brought back to the Operating Room where the left eye was prepped and draped in the usual sterile ophthalmic fashion and a lid speculum was placed. A paracentesis was created with the side port blade and the anterior chamber was filled with viscoelastic. A near clear corneal incision was performed with the steel keratome. A continuous curvilinear capsulorrhexis was performed with a cystotome followed by the capsulorrhexis forceps. Hydrodissection and hydrodelineation were carried out with BSS on a blunt cannula. The lens was removed in a stop and chop  technique and the remaining cortical material was removed with the irrigation-aspiration handpiece. The capsular bag was inflated with viscoelastic and the Technis [id number] lens was placed in the capsular bag without complication. The remaining viscoelastic was removed from the eye with the irrigation-aspiration handpiece. The wounds were hydrated. The anterior chamber was flushed with Miostat and the eye was inflated to physiologic pressure. 0.1 mL of cefuroxime concentration 10 mg/mL was placed in the anterior chamber. The wounds were found to be water tight. The eye was dressed with Vigamox. The patient was given protective glasses  to wear throughout the day and a shield with which to sleep tonight. The patient was also given drops with which to begin a drop regimen today and will follow-up with me in one day.  Implant Name Type Inv. Item Serial No. Manufacturer Lot No. LRB No. Used  LENS IOL DIOP 21.0 - [id number] Intraocular Lens LENS IOL DIOP 21.0 [id number] AMO   Left 1   Procedure(s) with comments: CATARACT EXTRACTION PHACO AND INTRAOCULAR LENS PLACEMENT (IOC) (Left) - [place] 1.06 AP% 23.9 CDE 15.81 Fluid pack lot # [id number]  Electronically signed: [place] 11/13/2015 7:42 AM

## 2015-11-13 NOTE — Discharge Instructions (Signed)
Eye Surgery Discharge Instructions  Expect mild scratchy sensation or mild soreness. DO NOT RUB YOUR EYE!  The day of surgery:  Minimal physical activity, but bed rest is not required  No reading, computer work, or close hand work  No bending, lifting, or straining.  May watch TV  For 24 hours:  No driving, legal decisions, or alcoholic beverages  Safety precautions  Eat anything you prefer: It is better to start with liquids, then soup then solid foods.  _____ Eye patch should be worn until postoperative exam tomorrow.  ____ Solar shield eyeglasses should be worn for comfort in the sunlight/patch while sleeping  Resume all regular medications including aspirin or Coumadin if these were discontinued prior to surgery. You may shower, bathe, shave, or wash your hair. Tylenol may be taken for mild discomfort.  Call your doctor if you experience significant pain, nausea, or vomiting, fever > 101 or other signs of infection. [phone] or [phone] Specific instructions:  Follow-up Information    Follow up with Tim Lair, MD On 11/14/2015.   Specialty:  Ophthalmology   Why:  9:20   Contact information:   [address] [place] [address] [phone]

## 2015-11-14 NOTE — Anesthesia Postprocedure Evaluation (Signed)
Anesthesia Post Note  Patient: William Ball  Procedure(s) Performed: Procedure(s) (LRB): CATARACT EXTRACTION PHACO AND INTRAOCULAR LENS PLACEMENT (IOC) (Left)  Patient location during evaluation: PACU Anesthesia Type: MAC Level of consciousness: awake and alert and oriented Pain management: pain level controlled Vital Signs Assessment: post-procedure vital signs reviewed and stable Respiratory status: spontaneous breathing Cardiovascular status: blood pressure returned to baseline Anesthetic complications: no    Last Vitals:  Filed Vitals:   11/13/15 0755 11/13/15 0758  BP: 144/69 123/58  Pulse: 45 45  Temp: 36.6 C   Resp: 16 16    Last Pain:  Filed Vitals:   11/14/15 0814  PainSc: 0-No pain                 Deiondra Denley

## 2015-11-19 ENCOUNTER — Encounter: Payer: Self-pay | Admitting: *Deleted

## 2015-11-27 ENCOUNTER — Ambulatory Visit
Admission: RE | Admit: 2015-11-27 | Discharge: 2015-11-27 | Disposition: A | Discharge status: Home / Self Care | Payer: PPO | Source: Ambulatory Visit | Attending: Ophthalmology | Admitting: Ophthalmology

## 2015-11-27 ENCOUNTER — Ambulatory Visit: Payer: PPO | Admitting: Anesthesiology

## 2015-11-27 ENCOUNTER — Encounter
Admission: RE | Disposition: A | Discharge status: Home / Self Care | Payer: Self-pay | Source: Ambulatory Visit | Attending: Ophthalmology

## 2015-11-27 ENCOUNTER — Encounter: Payer: Self-pay | Admitting: *Deleted

## 2015-11-27 DIAGNOSIS — M1990 Unspecified osteoarthritis, unspecified site: Secondary | ICD-10-CM | POA: Diagnosis not present

## 2015-11-27 DIAGNOSIS — K219 Gastro-esophageal reflux disease without esophagitis: Secondary | ICD-10-CM | POA: Insufficient documentation

## 2015-11-27 DIAGNOSIS — F17200 Nicotine dependence, unspecified, uncomplicated: Secondary | ICD-10-CM | POA: Diagnosis not present

## 2015-11-27 DIAGNOSIS — E1136 Type 2 diabetes mellitus with diabetic cataract: Secondary | ICD-10-CM | POA: Insufficient documentation

## 2015-11-27 DIAGNOSIS — R05 Cough: Secondary | ICD-10-CM | POA: Diagnosis not present

## 2015-11-27 DIAGNOSIS — I10 Essential (primary) hypertension: Secondary | ICD-10-CM | POA: Diagnosis not present

## 2015-11-27 DIAGNOSIS — H2511 Age-related nuclear cataract, right eye: Secondary | ICD-10-CM | POA: Diagnosis present

## 2015-11-27 HISTORY — PX: CATARACT EXTRACTION W/PHACO: SHX586

## 2015-11-27 LAB — GLUCOSE, CAPILLARY: Glucose-Capillary: 114 mg/dL — ABNORMAL HIGH (ref 65–99)

## 2015-11-27 SURGERY — CATARACT EXTRACTION PHACO AND INTRAOCULAR LENS PLACEMENT (IOC)
Anesthesia: Monitor Anesthesia Care | Site: Eye | Laterality: Right | Wound class: Clean

## 2015-11-27 MED ORDER — CEFUROXIME OPHTHALMIC INJECTION 1 MG/0.1 ML
INJECTION | OPHTHALMIC | Status: AC
  Filled 2015-11-27: qty 0.1

## 2015-11-27 MED ORDER — ARMC OPHTHALMIC DILATING GEL
1.0000 "application " | OPHTHALMIC | Status: AC | PRN
  Administered 2015-11-27 (×2): 1 "application " via OPHTHALMIC

## 2015-11-27 MED ORDER — NA CHONDROIT SULF-NA HYALURON 40-17 MG/ML IO SOLN
INTRAOCULAR | Status: AC
  Filled 2015-11-27: qty 1

## 2015-11-27 MED ORDER — MOXIFLOXACIN HCL 0.5 % OP SOLN
OPHTHALMIC | Status: DC | PRN
  Administered 2015-11-27: 1 [drp] via OPHTHALMIC

## 2015-11-27 MED ORDER — FENTANYL CITRATE (PF) 100 MCG/2ML IJ SOLN
INTRAMUSCULAR | Status: DC | PRN
  Administered 2015-11-27: 50 ug via INTRAVENOUS

## 2015-11-27 MED ORDER — LIDOCAINE HCL (PF) 1 % IJ SOLN
INTRAMUSCULAR | Status: AC
  Filled 2015-11-27: qty 2

## 2015-11-27 MED ORDER — NA CHONDROIT SULF-NA HYALURON 40-17 MG/ML IO SOLN
INTRAOCULAR | Status: DC | PRN
  Administered 2015-11-27: 1 mL via INTRAOCULAR

## 2015-11-27 MED ORDER — POVIDONE-IODINE 5 % OP SOLN
1.0000 "application " | Freq: Once | OPHTHALMIC | Status: AC
  Administered 2015-11-27: 1 "application " via OPHTHALMIC

## 2015-11-27 MED ORDER — ATROPINE SULFATE 0.4 MG/ML IJ SOLN
INTRAMUSCULAR | Status: DC | PRN
  Administered 2015-11-27: .12 mg via INTRAVENOUS

## 2015-11-27 MED ORDER — MOXIFLOXACIN HCL 0.5 % OP SOLN: 1.0000 [drp] | OPHTHALMIC | Status: DC | PRN

## 2015-11-27 MED ORDER — CARBACHOL 0.01 % IO SOLN
INTRAOCULAR | Status: DC | PRN
  Administered 2015-11-27: 0.5 mL via INTRAOCULAR

## 2015-11-27 MED ORDER — POVIDONE-IODINE 5 % OP SOLN
OPHTHALMIC | Status: AC
  Filled 2015-11-27: qty 30

## 2015-11-27 MED ORDER — EPINEPHRINE HCL 1 MG/ML IJ SOLN
INTRAMUSCULAR | Status: AC
  Filled 2015-11-27: qty 1

## 2015-11-27 MED ORDER — TETRACAINE HCL 0.5 % OP SOLN
1.0000 [drp] | Freq: Once | OPHTHALMIC | Status: AC
  Administered 2015-11-27: 1 [drp] via OPHTHALMIC

## 2015-11-27 MED ORDER — MIDAZOLAM HCL 2 MG/2ML IJ SOLN
INTRAMUSCULAR | Status: DC | PRN
  Administered 2015-11-27: 1 mg via INTRAVENOUS

## 2015-11-27 MED ORDER — ARMC OPHTHALMIC DILATING GEL
OPHTHALMIC | Status: AC
  Filled 2015-11-27: qty 0.25

## 2015-11-27 MED ORDER — MOXIFLOXACIN HCL 0.5 % OP SOLN
OPHTHALMIC | Status: AC
  Filled 2015-11-27: qty 3

## 2015-11-27 MED ORDER — TETRACAINE HCL 0.5 % OP SOLN
OPHTHALMIC | Status: AC
  Filled 2015-11-27: qty 2

## 2015-11-27 MED ORDER — SODIUM CHLORIDE 0.9 % IV SOLN
INTRAVENOUS | Status: DC
  Administered 2015-11-27: 13:00:00 via INTRAVENOUS

## 2015-11-27 MED ORDER — CEFUROXIME OPHTHALMIC INJECTION 1 MG/0.1 ML
INJECTION | OPHTHALMIC | Status: DC | PRN
  Administered 2015-11-27: 0.1 mL via INTRACAMERAL

## 2015-11-27 MED ORDER — EPINEPHRINE HCL 1 MG/ML IJ SOLN
INTRAOCULAR | Status: DC | PRN
  Administered 2015-11-27: 1 mL via OPHTHALMIC

## 2015-11-27 SURGICAL SUPPLY — 19 items
CANNULA ANT/CHMB 27GA (MISCELLANEOUS) ×2 IMPLANT
CUP MEDICINE 2OZ PLAST GRAD ST (MISCELLANEOUS) ×2
GLOVE BIO SURGEON STRL SZ8 (GLOVE) ×2
GLOVE BIOGEL M 6.5 STRL (GLOVE) ×2
GLOVE SURG LX 8.0 MICRO (GLOVE) ×1
GLOVE SURG LX STRL 8.0 MICRO (GLOVE) ×1
GOWN STRL REUS W/ TWL LRG LVL3 (GOWN DISPOSABLE) ×2
GOWN STRL REUS W/TWL LRG LVL3 (GOWN DISPOSABLE) ×2
LENS IOL TECNIS ITEC 21.5 (Intraocular Lens) ×2 IMPLANT
PACK CATARACT (MISCELLANEOUS) ×2
PACK CATARACT BRASINGTON LX (MISCELLANEOUS) ×2
PACK EYE AFTER SURG (MISCELLANEOUS) ×2
SOL BSS BAG (MISCELLANEOUS) ×2
SOL PREP PVP 2OZ (MISCELLANEOUS) ×2
SYR 3ML LL SCALE MARK (SYRINGE) ×2
SYR 5ML LL (SYRINGE) ×2
SYR TB 1ML 27GX1/2 LL (SYRINGE) ×2
WATER STERILE IRR 1000ML POUR (IV SOLUTION) ×2
WIPE NON LINTING 3.25X3.25 (MISCELLANEOUS) ×2

## 2015-11-27 NOTE — H&P (Signed)
  All labs reviewed. Abnormal studies sent to patients PCP when indicated.  Previous H&P reviewed, patient examined, there are NO CHANGES.  William Ball LOUIS7/25/20172:36 PM

## 2015-11-27 NOTE — Anesthesia Postprocedure Evaluation (Signed)
Anesthesia Post Note  Patient: William Ball Anes Skeens  Procedure(s) Performed: Procedure(s) (LRB): CATARACT EXTRACTION PHACO AND INTRAOCULAR LENS PLACEMENT (IOC) (Right)  Patient location during evaluation: PACU Anesthesia Type: MAC Level of consciousness: awake, awake and alert, oriented and patient cooperative Pain management: pain level controlled Vital Signs Assessment: post-procedure vital signs reviewed and stable Respiratory status: spontaneous breathing Cardiovascular status: blood pressure returned to baseline Postop Assessment: no headache Anesthetic complications: no    Last Vitals:  Vitals:   11/27/15 1256  BP: (!) 157/67  Pulse: (!) 48  Resp: 16  Temp: 37.1 C    Last Pain:  Vitals:   11/27/15 1256  TempSrc: Oral                 Bernardo Heater

## 2015-11-27 NOTE — Discharge Instructions (Signed)

## 2015-11-27 NOTE — Transfer of Care (Signed)
Immediate Anesthesia Transfer of Care Note  Patient: Lillard Anes Marriott  Procedure(s) Performed: Procedure(s) with comments: CATARACT EXTRACTION PHACO AND INTRAOCULAR LENS PLACEMENT (IOC) (Right) - [place] 1.06 AP% 23.8 CDE 15.80 Fluid pack lot # [id number]  Patient Location: PACU  Anesthesia Type:MAC  Level of Consciousness: awake, alert , oriented and patient cooperative  Airway & Oxygen Therapy: Patient Spontanous Breathing  Post-op Assessment: Report given to RN and Post -op Vital signs reviewed and stable  Post vital signs: Reviewed and stable  Last Vitals:  Vitals:   11/27/15 1256  BP: (!) 157/67  Pulse: (!) 48  Resp: 16  Temp: 37.1 C    Last Pain:  Vitals:   11/27/15 1256  TempSrc: Oral         Complications: No apparent anesthesia complications

## 2015-11-27 NOTE — Anesthesia Preprocedure Evaluation (Signed)
Anesthesia Evaluation  Patient identified by MRN, date of birth, ID band Patient awake    Reviewed: Allergy & Precautions, NPO status , Patient's Chart, lab work & pertinent test results  Airway Mallampati: II  TM Distance: >3 FB Neck ROM: limited    Dental   Pulmonary Current Smoker,    Pulmonary exam normal breath sounds clear to auscultation       Cardiovascular hypertension, Pt. on medications Normal cardiovascular exam Rhythm:regular Rate:Normal     Neuro/Psych negative neurological ROS  negative psych ROS   GI/Hepatic GERD  Medicated,  Endo/Other  diabetes, Well Controlled, Type 2  Renal/GU   negative genitourinary   Musculoskeletal  (+) Arthritis , Osteoarthritis,    Abdominal Normal abdominal exam  (+)   Peds negative pediatric ROS (+)  Hematology negative hematology ROS (+)   Anesthesia Other Findings Past Medical History: No date: Cough     Comment: CHRONIC DRY No date: Diabetes mellitus without complication (HCC)     Comment: borderline No date: GERD (gastroesophageal reflux disease) No date: Hepatomegaly No date: Hyperlipidemia No date: Hypertension No date: Substance abuse   Reproductive/Obstetrics                             Anesthesia Physical  Anesthesia Plan  ASA: III  Anesthesia Plan: MAC   Post-op Pain Management:    Induction: Intravenous  Airway Management Planned: Nasal Cannula  Additional Equipment:   Intra-op Plan:   Post-operative Plan:   Informed Consent: I have reviewed the patients History and Physical, chart, labs and discussed the procedure including the risks, benefits and alternatives for the proposed anesthesia with the patient or authorized representative who has indicated his/her understanding and acceptance.   Dental advisory given  Plan Discussed with: CRNA and Surgeon  Anesthesia Plan Comments:         Anesthesia Quick  Evaluation

## 2015-11-27 NOTE — Op Note (Signed)
PREOPERATIVE DIAGNOSIS:  Nuclear sclerotic cataract of the right eye.   POSTOPERATIVE DIAGNOSIS: NUCLEAR SCLERTOIC CATARACT RIGHT EYE   OPERATIVE PROCEDURE:  Procedure(s): CATARACT EXTRACTION PHACO AND INTRAOCULAR LENS PLACEMENT (IOC)   SURGEON:  [name], MD.   ANESTHESIA:  Anesthesiologist: Andria Frames, MD CRNA: Bernardo Heater, CRNA  1.      Managed anesthesia care. 2.      Topical tetracaine drops followed by 2% Xylocaine jelly applied in the preoperative holding area.   COMPLICATIONS:  None.   TECHNIQUE:   Stop and chop   DESCRIPTION OF PROCEDURE:  The patient was examined and consented in the preoperative holding area where the aforementioned topical anesthesia was applied to the right eye and then brought back to the Operating Room where the right eye was prepped and draped in the usual sterile ophthalmic fashion and a lid speculum was placed. A paracentesis was created with the side port blade and the anterior chamber was filled with viscoelastic. A near clear corneal incision was performed with the steel keratome. A continuous curvilinear capsulorrhexis was performed with a cystotome followed by the capsulorrhexis forceps. Hydrodissection and hydrodelineation were carried out with BSS on a blunt cannula. The lens was removed in a stop and chop  technique and the remaining cortical material was removed with the irrigation-aspiration handpiece. The capsular bag was inflated with viscoelastic and the Technis [id number]  lens was placed in the capsular bag without complication. The remaining viscoelastic was removed from the eye with the irrigation-aspiration handpiece. The wounds were hydrated. The anterior chamber was flushed with Miostat and the eye was inflated to physiologic pressure. 0.1 mL of cefuroxime concentration 10 mg/mL was placed in the anterior chamber. The wounds were found to be water tight. The eye was dressed with Vigamox. The patient was given protective glasses  to wear throughout the day and a shield with which to sleep tonight. The patient was also given drops with which to begin a drop regimen today and will follow-up with me in one day.  Implant Name Type Inv. Item Serial No. Manufacturer Lot No. LRB No. Used  LENS IOL DIOP 21.5 - [id number] Intraocular Lens LENS IOL DIOP 21.5 [id number]  Right 1   Procedure(s) with comments: CATARACT EXTRACTION PHACO AND INTRAOCULAR LENS PLACEMENT (IOC) (Right) - [place] 1.06 AP% 23.8 CDE 15.80 Fluid pack lot # :[id number]  Electronically signed: [place] 11/27/2015 3:05 PM

## 2015-12-05 ENCOUNTER — Encounter: Payer: Self-pay | Admitting: Family Medicine

## 2015-12-05 ENCOUNTER — Ambulatory Visit (INDEPENDENT_AMBULATORY_CARE_PROVIDER_SITE_OTHER): Payer: PPO | Admitting: Family Medicine

## 2015-12-05 VITALS — BP 135/60 | HR 48 | Temp 97.9°F | Ht 70.7 in | Wt 181.0 lb

## 2015-12-05 DIAGNOSIS — M1610 Unilateral primary osteoarthritis, unspecified hip: Secondary | ICD-10-CM

## 2015-12-05 DIAGNOSIS — D692 Other nonthrombocytopenic purpura: Secondary | ICD-10-CM | POA: Insufficient documentation

## 2015-12-05 DIAGNOSIS — M1990 Unspecified osteoarthritis, unspecified site: Secondary | ICD-10-CM | POA: Diagnosis not present

## 2015-12-05 DIAGNOSIS — E119 Type 2 diabetes mellitus without complications: Secondary | ICD-10-CM

## 2015-12-05 DIAGNOSIS — I10 Essential (primary) hypertension: Secondary | ICD-10-CM | POA: Diagnosis not present

## 2015-12-05 DIAGNOSIS — I739 Peripheral vascular disease, unspecified: Secondary | ICD-10-CM

## 2015-12-05 DIAGNOSIS — Z23 Encounter for immunization: Secondary | ICD-10-CM

## 2015-12-05 DIAGNOSIS — F17210 Nicotine dependence, cigarettes, uncomplicated: Secondary | ICD-10-CM | POA: Insufficient documentation

## 2015-12-05 LAB — HEMOGLOBIN A1C: Hemoglobin A1C: 6.7

## 2015-12-05 LAB — BAYER DCA HB A1C WAIVED: HB A1C (BAYER DCA - WAIVED): 6.7 %

## 2015-12-05 NOTE — Assessment & Plan Note (Signed)
The current medical regimen is effective;  continue present plan and medications.  

## 2015-12-05 NOTE — Assessment & Plan Note (Signed)
Claudication symptoms left leg will refer to vascular to further evaluate Diminished pulses left foot but history of old trauma

## 2015-12-05 NOTE — Patient Instructions (Addendum)
Tdap Vaccine (Tetanus, Diphtheria and Pertussis): What You Need to Know 1. Why get vaccinated? Tetanus, diphtheria and pertussis are very serious diseases. Tdap vaccine can protect us from these diseases. And, Tdap vaccine given to pregnant women can protect newborn babies against pertussis. TETANUS (Lockjaw) is rare in the [place] today. It causes painful muscle tightening and stiffness, usually all over the body.  It can lead to tightening of muscles in the head and neck so you can't open your mouth, swallow, or sometimes even breathe. Tetanus kills about 1 out of 10 people who are infected even after receiving the best medical care. DIPHTHERIA is also rare in the [place] today. It can cause a thick coating to form in the back of the throat.  It can lead to breathing problems, heart failure, paralysis, and death. PERTUSSIS (Whooping Cough) causes severe coughing spells, which can cause difficulty breathing, vomiting and disturbed sleep.  It can also lead to weight loss, incontinence, and rib fractures. Up to 2 in 100 adolescents and 5 in 100 adults with pertussis are hospitalized or have complications, which could include pneumonia or death. These diseases are caused by bacteria. Diphtheria and pertussis are spread from person to person through secretions from coughing or sneezing. Tetanus enters the body through cuts, scratches, or wounds. Before vaccines, as many as 200,000 cases of diphtheria, 200,000 cases of pertussis, and hundreds of cases of tetanus, were reported in the [place] each year. Since vaccination began, reports of cases for tetanus and diphtheria have dropped by about 99% and for pertussis by about 80%. 2. Tdap vaccine Tdap vaccine can protect adolescents and adults from tetanus, diphtheria, and pertussis. One dose of Tdap is routinely given at age 11 or 12. People who did not get Tdap at that age should get it as soon as possible. Tdap is especially important  for healthcare professionals and anyone having close contact with a baby younger than 12 months. Pregnant women should get a dose of Tdap during every pregnancy, to protect the newborn from pertussis. Infants are most at risk for severe, life-threatening complications from pertussis. Another vaccine, called Td, protects against tetanus and diphtheria, but not pertussis. A Td booster should be given every 10 years. Tdap may be given as one of these boosters if you have never gotten Tdap before. Tdap may also be given after a severe cut or burn to prevent tetanus infection. Your doctor or the person giving you the vaccine can give you more information. Tdap may safely be given at the same time as other vaccines. 3. Some people should not get this vaccine  A person who has ever had a life-threatening allergic reaction after a previous dose of any diphtheria, tetanus or pertussis containing vaccine, OR has a severe allergy to any part of this vaccine, should not get Tdap vaccine. Tell the person giving the vaccine about any severe allergies.  Anyone who had coma or long repeated seizures within 7 days after a childhood dose of DTP or DTaP, or a previous dose of Tdap, should not get Tdap, unless a cause other than the vaccine was found. They can still get Td.  Talk to your doctor if you:  have seizures or another nervous system problem,  had severe pain or swelling after any vaccine containing diphtheria, tetanus or pertussis,  ever had a condition called Guillain-Barr Syndrome (GBS),  aren't feeling well on the day the shot is scheduled. 4. Risks With any medicine, including vaccines, there is   a chance of side effects. These are usually mild and go away on their own. Serious reactions are also possible but are rare. Most people who get Tdap vaccine do not have any problems with it. Mild problems following Tdap (Did not interfere with activities)  Pain where the shot was given (about 3 in 4  adolescents or 2 in 3 adults)  Redness or swelling where the shot was given (about 1 person in 5)  Mild fever of at least 100.4F (up to about 1 in 25 adolescents or 1 in 100 adults)  Headache (about 3 or 4 people in 10)  Tiredness (about 1 person in 3 or 4)  Nausea, vomiting, diarrhea, stomach ache (up to 1 in 4 adolescents or 1 in 10 adults)  Chills, sore joints (about 1 person in 10)  Body aches (about 1 person in 3 or 4)  Rash, swollen glands (uncommon) Moderate problems following Tdap (Interfered with activities, but did not require medical attention)  Pain where the shot was given (up to 1 in 5 or 6)  Redness or swelling where the shot was given (up to about 1 in 16 adolescents or 1 in 12 adults)  Fever over 102F (about 1 in 100 adolescents or 1 in 250 adults)  Headache (about 1 in 7 adolescents or 1 in 10 adults)  Nausea, vomiting, diarrhea, stomach ache (up to 1 or 3 people in 100)  Swelling of the entire arm where the shot was given (up to about 1 in 500). Severe problems following Tdap (Unable to perform usual activities; required medical attention)  Swelling, severe pain, bleeding and redness in the arm where the shot was given (rare). Problems that could happen after any vaccine:  People sometimes faint after a medical procedure, including vaccination. Sitting or lying down for about 15 minutes can help prevent fainting, and injuries caused by a fall. Tell your doctor if you feel dizzy, or have vision changes or ringing in the ears.  Some people get severe pain in the shoulder and have difficulty moving the arm where a shot was given. This happens very rarely.  Any medication can cause a severe allergic reaction. Such reactions from a vaccine are very rare, estimated at fewer than 1 in a million doses, and would happen within a few minutes to a few hours after the vaccination. As with any medicine, there is a very remote chance of a vaccine causing a serious  injury or death. The safety of vaccines is always being monitored. For more information, visit: www.cdc.gov/vaccinesafety/ 5. What if there is a serious problem? What should I look for?  Look for anything that concerns you, such as signs of a severe allergic reaction, very high fever, or unusual behavior.  Signs of a severe allergic reaction can include hives, swelling of the face and throat, difficulty breathing, a fast heartbeat, dizziness, and weakness. These would usually start a few minutes to a few hours after the vaccination. What should I do?  If you think it is a severe allergic reaction or other emergency that can't wait, call 9-1-1 or get the person to the nearest hospital. Otherwise, call your doctor.  Afterward, the reaction should be reported to the Vaccine Adverse Event Reporting System (VAERS). Your doctor might file this report, or you can do it yourself through the VAERS web site at www.vaers.hhs.gov, or by calling 1-800-822-7967. VAERS does not give medical advice.  6. The National Vaccine Injury Compensation Program The National Vaccine Injury Compensation Program (  VICP) is a federal program that was created to compensate people who may have been injured by certain vaccines. Persons who believe they may have been injured by a vaccine can learn about the program and about filing a claim by calling 1-800-338-2382 or visiting the VICP website at www.hrsa.gov/vaccinecompensation. There is a time limit to file a claim for compensation. 7. How can I learn more?  Ask your doctor. He or she can give you the vaccine package insert or suggest other sources of information.  Call your local or state health department.  Contact the Centers for Disease Control and Prevention (CDC):  Call 1-800-232-4636 (1-800-CDC-INFO) or  Visit CDC's website at www.cdc.gov/vaccines CDC Tdap Vaccine VIS (06/28/13)   This information is not intended to replace advice given to you by your health care  provider. Make sure you discuss any questions you have with your health care provider.   Document Released: 10/21/2011 Document Revised: 05/12/2014 Document Reviewed: 08/03/2013 Elsevier Interactive Patient Education 2016 Elsevier Inc.  

## 2015-12-05 NOTE — Progress Notes (Signed)
BP 135/60 (BP Location: Left Arm, Patient Position: Sitting, Cuff Size: Normal)   Pulse (!) 48   Temp 97.9 F (36.6 C)   Ht 5' 10.7" (1.796 m) Comment: with shoes  Wt 181 lb (82.1 kg) Comment: with shoes  SpO2 97%   BMI 25.46 kg/m    Subjective:    Patient ID: William Ball, male    DOB: 16-Feb-1946, 70 y.o.   MRN: [id number]  HPI: William Ball is a 70 y.o. male  Chief Complaint  Patient presents with  . Diabetes  Doing well with diabetes no complaints no problems noted low blood sugar spells or issues. Takes medications faithfully  Patient's biggest problem today is with walking even short distances gets bilateral hip pain but also significant discomfort in left quadriceps and hamstring muscles to the point he has to stop walking and rest then is able to go again. No leg cramping symptoms. This problems been ongoing for 10 years or so but getting worse to the point it's bothering him every day now.  Chronic senile purpura changes right forearm comes and goes but mostly comes no known trauma or irritation  Blood pressure doing good with no complaints from medications good control no side effects taking meds every day  Relevant past medical, surgical, family and social history reviewed and updated as indicated. Interim medical history since our last visit reviewed. Allergies and medications reviewed and updated.  Review of Systems  Constitutional: Negative.   Respiratory: Negative.   Cardiovascular: Negative.     Per HPI unless specifically indicated above     Objective:    BP 135/60 (BP Location: Left Arm, Patient Position: Sitting, Cuff Size: Normal)   Pulse (!) 48   Temp 97.9 F (36.6 C)   Ht 5' 10.7" (1.796 m) Comment: with shoes  Wt 181 lb (82.1 kg) Comment: with shoes  SpO2 97%   BMI 25.46 kg/m   Wt Readings from Last 3 Encounters:  12/05/15 181 lb (82.1 kg)  11/27/15 174 lb (78.9 kg)  11/13/15 173 lb (78.5 kg)    Physical Exam  Constitutional: He is  oriented to person, place, and time. He appears well-developed and well-nourished. No distress.  HENT:  Head: Normocephalic and atraumatic.  Right Ear: Hearing normal.  Left Ear: Hearing normal.  Nose: Nose normal.  Eyes: Conjunctivae and lids are normal. Right eye exhibits no discharge. Left eye exhibits no discharge. No scleral icterus.  Cardiovascular: Normal rate, regular rhythm and normal heart sounds.   Pulmonary/Chest: Effort normal and breath sounds normal. No respiratory distress.  Musculoskeletal: Normal range of motion.  Right foot strong anterior tibial diminished posterior tibial left foot no pulses palpated patient with multiple old trauma to his left leg from accident  Neurological: He is alert and oriented to person, place, and time.  Skin: Skin is intact. No rash noted.  Psychiatric: He has a normal mood and affect. His speech is normal and behavior is normal. Judgment and thought content normal. Cognition and memory are normal.    Results for orders placed or performed in visit on 12/05/15  Bayer DCA Hb A1c Waived  Result Value Ref Range   Bayer DCA Hb A1c Waived 6.7 <7.0 %  Hemoglobin A1c  Result Value Ref Range   Hemoglobin A1C 6.7       Assessment & Plan:   Problem List Items Addressed This Visit      Cardiovascular and Mediastinum   Hypertension    The current  medical regimen is effective;  continue present plan and medications.       Senile purpura ([place])     Endocrine   Diabetes mellitus without complication ([place]) - Primary    The current medical regimen is effective;  continue present plan and medications.       Relevant Orders   Bayer DCA Hb A1c Waived (Completed)     Musculoskeletal and Integument   Hip arthritis    Hip arthritis wants to try some arthritis medicine Reviewed patient already taking meloxicam Will add on Tylenol discuss if problems persist and after vascular workup will consider orthopedic referral.        Other    Claudication [facility])    Claudication symptoms left leg will refer to vascular to further evaluate Diminished pulses left foot but history of old trauma      Relevant Orders   Ambulatory referral to Vascular Surgery   Smoking greater than 40 pack years    Other Visit Diagnoses    Need for Tdap vaccination       Relevant Orders   Tdap vaccine greater than or equal to 7yo IM (Completed)       Follow up plan: Return in about 3 months (around 03/06/2016) for A1C, LIPIDS, ALT, AST, BMP.

## 2015-12-05 NOTE — Assessment & Plan Note (Addendum)
Hip arthritis wants to try some arthritis medicine Reviewed patient already taking meloxicam Will add on Tylenol discuss if problems persist and after vascular workup will consider orthopedic referral.

## 2015-12-20 ENCOUNTER — Other Ambulatory Visit: Payer: Self-pay | Admitting: Family Medicine

## 2015-12-25 ENCOUNTER — Other Ambulatory Visit: Payer: Self-pay | Admitting: Vascular Surgery

## 2015-12-26 ENCOUNTER — Other Ambulatory Visit
Admission: RE | Admit: 2015-12-26 | Discharge: 2015-12-26 | Disposition: A | Discharge status: Home / Self Care | Payer: PPO | Source: Ambulatory Visit | Attending: Vascular Surgery | Admitting: Vascular Surgery

## 2015-12-26 DIAGNOSIS — I739 Peripheral vascular disease, unspecified: Secondary | ICD-10-CM | POA: Diagnosis present

## 2015-12-26 LAB — CREATININE, SERUM
Creatinine, Ser: 1.15 mg/dL (ref 0.61–1.24)
GFR calc Af Amer: >60 mL/min
GFR calc non Af Amer: >60 mL/min

## 2015-12-26 LAB — BUN: BUN: 28 mg/dL — ABNORMAL HIGH (ref 6–20)

## 2015-12-27 ENCOUNTER — Ambulatory Visit
Admission: RE | Admit: 2015-12-27 | Discharge: 2015-12-27 | Disposition: A | Discharge status: Home / Self Care | Payer: PPO | Source: Ambulatory Visit | Attending: Vascular Surgery | Admitting: Vascular Surgery

## 2015-12-27 ENCOUNTER — Encounter: Payer: Self-pay | Admitting: *Deleted

## 2015-12-27 ENCOUNTER — Encounter
Admission: RE | Disposition: A | Discharge status: Home / Self Care | Payer: Self-pay | Source: Ambulatory Visit | Attending: Vascular Surgery

## 2015-12-27 DIAGNOSIS — Z7984 Long term (current) use of oral hypoglycemic drugs: Secondary | ICD-10-CM | POA: Diagnosis not present

## 2015-12-27 DIAGNOSIS — F1010 Alcohol abuse, uncomplicated: Secondary | ICD-10-CM | POA: Diagnosis not present

## 2015-12-27 DIAGNOSIS — Z7982 Long term (current) use of aspirin: Secondary | ICD-10-CM | POA: Diagnosis not present

## 2015-12-27 DIAGNOSIS — E119 Type 2 diabetes mellitus without complications: Secondary | ICD-10-CM | POA: Insufficient documentation

## 2015-12-27 DIAGNOSIS — Z809 Family history of malignant neoplasm, unspecified: Secondary | ICD-10-CM | POA: Diagnosis not present

## 2015-12-27 DIAGNOSIS — F17210 Nicotine dependence, cigarettes, uncomplicated: Secondary | ICD-10-CM | POA: Insufficient documentation

## 2015-12-27 DIAGNOSIS — I10 Essential (primary) hypertension: Secondary | ICD-10-CM | POA: Insufficient documentation

## 2015-12-27 DIAGNOSIS — I70213 Atherosclerosis of native arteries of extremities with intermittent claudication, bilateral legs: Secondary | ICD-10-CM | POA: Diagnosis present

## 2015-12-27 HISTORY — PX: PERIPHERAL VASCULAR CATHETERIZATION: SHX172C

## 2015-12-27 LAB — GLUCOSE, CAPILLARY: Glucose-Capillary: 135 mg/dL — ABNORMAL HIGH (ref 65–99)

## 2015-12-27 SURGERY — LOWER EXTREMITY ANGIOGRAPHY
Anesthesia: Moderate Sedation | Laterality: Left

## 2015-12-27 MED ORDER — FENTANYL CITRATE (PF) 100 MCG/2ML IJ SOLN
INTRAMUSCULAR | Status: AC
  Filled 2015-12-27: qty 2

## 2015-12-27 MED ORDER — HEPARIN (PORCINE) IN NACL 2-0.9 UNIT/ML-% IJ SOLN
INTRAMUSCULAR | Status: AC
  Filled 2015-12-27: qty 1000

## 2015-12-27 MED ORDER — FENTANYL CITRATE (PF) 100 MCG/2ML IJ SOLN
INTRAMUSCULAR | Status: DC | PRN
  Administered 2015-12-27 (×3): 50 ug via INTRAVENOUS

## 2015-12-27 MED ORDER — HEPARIN SODIUM (PORCINE) 1000 UNIT/ML IJ SOLN
INTRAMUSCULAR | Status: DC | PRN
  Administered 2015-12-27 (×2): 2500 [IU] via INTRAVENOUS

## 2015-12-27 MED ORDER — FAMOTIDINE 20 MG PO TABS: 40.0000 mg | ORAL_TABLET | ORAL | Status: DC | PRN

## 2015-12-27 MED ORDER — DEXTROSE 5 % IV SOLN: 1.5000 g | INTRAVENOUS | Status: DC

## 2015-12-27 MED ORDER — CLOPIDOGREL BISULFATE 75 MG PO TABS: 75.0000 mg | ORAL_TABLET | Freq: Every day | ORAL | 11 refills | Status: DC

## 2015-12-27 MED ORDER — ATORVASTATIN CALCIUM 20 MG PO TABS: 20.0000 mg | ORAL_TABLET | Freq: Every day | ORAL | 5 refills | Status: DC

## 2015-12-27 MED ORDER — MIDAZOLAM HCL 5 MG/5ML IJ SOLN
INTRAMUSCULAR | Status: AC
  Filled 2015-12-27: qty 5

## 2015-12-27 MED ORDER — MIDAZOLAM HCL 2 MG/2ML IJ SOLN
INTRAMUSCULAR | Status: DC | PRN
  Administered 2015-12-27: 2 mg via INTRAVENOUS
  Administered 2015-12-27: 1 mg via INTRAVENOUS
  Administered 2015-12-27: 2 mg via INTRAVENOUS

## 2015-12-27 MED ORDER — IOPAMIDOL (ISOVUE-300) INJECTION 61%
INTRAVENOUS | Status: DC | PRN
  Administered 2015-12-27: 85 mL via INTRAVENOUS

## 2015-12-27 MED ORDER — HEPARIN SODIUM (PORCINE) 1000 UNIT/ML IJ SOLN
INTRAMUSCULAR | Status: AC
  Filled 2015-12-27: qty 1

## 2015-12-27 MED ORDER — ONDANSETRON HCL 4 MG/2ML IJ SOLN: 4.0000 mg | Freq: Four times a day (QID) | INTRAMUSCULAR | Status: DC | PRN

## 2015-12-27 MED ORDER — METHYLPREDNISOLONE SODIUM SUCC 125 MG IJ SOLR: 125.0000 mg | INTRAMUSCULAR | Status: DC | PRN

## 2015-12-27 MED ORDER — LIDOCAINE-EPINEPHRINE (PF) 1 %-1:200000 IJ SOLN
INTRAMUSCULAR | Status: AC
  Filled 2015-12-27: qty 30

## 2015-12-27 MED ORDER — HYDROMORPHONE HCL 1 MG/ML IJ SOLN: 1.0000 mg | Freq: Once | INTRAMUSCULAR | Status: DC

## 2015-12-27 MED ORDER — SODIUM CHLORIDE 0.9 % IV SOLN
INTRAVENOUS | Status: DC
  Administered 2015-12-27: 12:00:00 via INTRAVENOUS

## 2015-12-27 SURGICAL SUPPLY — 17 items
BALLN DORADO 7X80X80 (BALLOONS) ×2
BALLN LUTONIX DCB 7X60X130 (BALLOONS) ×4
BALLN ULTRVRSE 5X60X75C (BALLOONS) ×2
CATH 5FR REUT (CATHETERS) ×2
CATH PIG 70CM (CATHETERS) ×2
CATH TORCON 5FR 0.38 (CATHETERS) ×2
DEVICE PRESTO INFLATION (MISCELLANEOUS) ×2
DEVICE STARCLOSE SE CLOSURE (Vascular Products) ×2 IMPLANT
GLIDECATH 4FR STR (CATHETERS) ×2
GLIDEWIRE ADV .035X260CM (WIRE) ×2
PACK ANGIOGRAPHY (CUSTOM PROCEDURE TRAY) ×2
SHEATH BRITE TIP 5FRX11 (SHEATH) ×2
SHEATH BRITE TIP 6FR X 23 (SHEATH) ×2
STENT LIFESTAR 8X80X80 (Permanent Stent) ×2 IMPLANT
SYR MEDRAD MARK V 150ML (SYRINGE) ×2
TUBING CONTRAST HIGH PRESS 72 (TUBING) ×2
WIRE J 3MM .035X145CM (WIRE) ×2

## 2015-12-27 NOTE — Op Note (Signed)
[facility] Percutaneous Study/Intervention Procedural Note   Date of Surgery: 12/27/2015  Surgeon(s):Rendon Howell   Assistants:none  Pre-operative Diagnosis: PAD with claudication  Post-operative diagnosis: Same  Procedure(s) Performed: 1. Ultrasound guidance for vascular access right femoral artery 2. Catheter placement into left SFA from right femoral approach 3. Aortogram and selective left lower extremity angiogram 4. Percutaneous transluminal angioplasty of right common iliac artery and proximal external iliac artery with 7 mm diameter x 6 cm length Lutonix drug-coated balloon 5. Percutaneous transluminal angioplasty of left distal common iliac artery and external iliac artery with 7 mm diameter by 6 cm length Lutonix drug-coated balloon  6.  Self-expanding stent placement to the left iliac arteries with 88m diameter by 8 cm length life Star stent 7. StarClose closure device right femoral artery  EBL: 25 cc  Contrast: 85 cc  Fluoro Time: 9.9 minutes  Moderate Conscious Sedation Time: approximately 40 minutes using 5 mg of Versed and 150 mcg of Fentanyl  Indications: Patient is a 70y.o.male with lifestyle limiting claudication. The patient has noninvasive study showing mildly reduced right ABI and a severely reduced left ABI. His left leg was worse than his right.. The patient is brought in for angiography for further evaluation and potential treatment. Risks and benefits are discussed and informed consent is obtained  Procedure: The patient was identified and appropriate procedural time out was performed. The patient was then placed supine on the table and prepped and draped in the usual sterile fashion.Moderate conscious sedation was administered during a face to face encounter with the patient throughout the procedure with my supervision of the RN  administering medicines and monitoring the patient's vital signs, pulse oximetry, telemetry and mental status throughout from the start of the procedure until the patient was taken to the recovery room. Ultrasound was used to evaluate the right common femoral artery. It was patent . A digital ultrasound image was acquired. A Seldinger needle was used to access the right common femoral artery under direct ultrasound guidance and a permanent image was performed. A 0.035 J wire was advanced without resistance and a 5Fr sheath was placed. Pigtail catheter was placed into the aorta and an AP aortogram was performed. This demonstrated normal left renal artery with mild stenosis of the right renal artery, and normal aorta and iliac segments with significant stenosis bilaterally. The right common iliac artery had about a 70-75% stenosis. The left common iliac artery had what appeared to be a moderate stenosis in the 60% range just above the bifurcation, but a few centimeters into the left external iliac artery was a high-grade near occlusive stenosis that was highly calcific. I then crossed the aortic bifurcation with a VS 1 catheter with a mild amount of difficulty and advanced to the left femoral head. With significant stored energy I had to advance a wire down into the distal SFA and was able to advance a 4 French glide catheter into the proximal SFA to evaluate the left lower extremity. Selective left lower extremity angiogram was then performed. This demonstrated normal common femoral artery, profunda femoris artery, and an SFA with only mild stenosis in the distal segment of about 20%. Distal runoff was difficult to opacify but there appeared to be two-vessel runoff. The high-grade near occlusive inflow stenosis made it difficult to see the distal runoff.. The patient was systemically heparinized and a 6 French 23 cm sheath was then placed over the Terumo Advantage wire. I treated the right common iliac artery  with a 7 mm diameter by 6 cm length Lutonix drug-coated angioplasty balloon. The distal extent of the balloon went in the first centimeter or 2 of the external iliac artery and the proximal extent of the balloon did not reach the aortic bifurcation. This was inflated to 10 atm for 1 minute. Completion angiogram showed about a 20-25% residual stenosis that was not flow limiting. I then addressed the left iliac artery. Again, a 7 mm diameter by 6 cm length Lutonix drug-coated angioplasty balloon was used to treat the iliac disease. 2 inflations were required to encompass the entirety of the disease. I actually predilated the highly calcific lesion with a 5 mm balloon initially but then inflated the 7 mm diameter drug-coated balloon to 12 atm for 1 minute. The balloon was pulled back to encompass the common iliac artery stenosis and inflated to 10 atm for 1 minute. There was still a greater than 50% residual stenosis after angioplasty in the left iliac system. I elected to place a self-expanding stent. I used an 8 mm diameter by 8 cm length self-expanding stent to cover the entirety of the area. This was postdilated with a 7 mm diameter high pressure balloon inflated to over 20 atm. Completion angiogram following this showed about a 20% residual stenosis in the common iliac artery and about a 30-35% residual stenosis in the external iliac artery, neither of which appeared flow limiting. I elected to terminate the procedure. The sheath was removed and StarClose closure device was deployed in the right femoral artery with excellent hemostatic result. The patient was taken to the recovery room in stable condition having tolerated the procedure well.  Findings:  Aortogram: This demonstrated normal left renal artery with mild stenosis of the right renal artery, and normal aorta and iliac segments with significant stenosis bilaterally. The right common iliac artery had about a 70-75% stenosis. The left  common iliac artery had what appeared to be a moderate stenosis in the 60% range just above the bifurcation, but a few centimeters into the left external iliac artery was a high-grade near occlusive stenosis that was highly calcific. Left Lower Extremity: Normal common femoral artery, profunda femoris artery, and an SFA with only mild stenosis in the distal segment of about 20%. Distal runoff was difficult to opacify but there appeared to be two-vessel runoff. The high-grade near occlusive inflow stenosis made it difficult to see the distal runoff.   Disposition: Patient was taken to the recovery room in stable condition having tolerated the procedure well.  Complications: None  Raylei Losurdo 12/27/2015 12:36 PM

## 2015-12-27 NOTE — H&P (Signed)
  [facility] History & Physical Update  The patient was interviewed and re-examined.  The patient's previous History and Physical has been reviewed and is unchanged.  There is no change in the plan of care. We plan to proceed with the scheduled procedure.  DEW,JASON, MD  12/27/2015, 9:42 AM

## 2015-12-27 NOTE — Progress Notes (Signed)
Patient tolerated PO liquids and solid without complication. Site is soft, warm with no swelling or bleeding noted.

## 2015-12-28 ENCOUNTER — Encounter: Payer: Self-pay | Admitting: Vascular Surgery

## 2016-01-07 ENCOUNTER — Other Ambulatory Visit: Payer: Self-pay | Admitting: Family Medicine

## 2016-03-10 ENCOUNTER — Encounter: Payer: Self-pay | Admitting: Family Medicine

## 2016-03-10 ENCOUNTER — Ambulatory Visit (INDEPENDENT_AMBULATORY_CARE_PROVIDER_SITE_OTHER): Payer: PPO | Admitting: Family Medicine

## 2016-03-10 VITALS — BP 136/68 | HR 64 | Temp 98.7°F | Wt 185.7 lb

## 2016-03-10 DIAGNOSIS — E785 Hyperlipidemia, unspecified: Secondary | ICD-10-CM

## 2016-03-10 DIAGNOSIS — E119 Type 2 diabetes mellitus without complications: Secondary | ICD-10-CM

## 2016-03-10 DIAGNOSIS — Z23 Encounter for immunization: Secondary | ICD-10-CM | POA: Diagnosis not present

## 2016-03-10 DIAGNOSIS — I10 Essential (primary) hypertension: Secondary | ICD-10-CM

## 2016-03-10 LAB — LP+ALT+AST PICCOLO, WAIVED
ALT (SGPT) Piccolo, Waived: 37 U/L (ref 10–47)
AST (SGOT) Piccolo, Waived: 42 U/L — ABNORMAL HIGH (ref 11–38)
Chol/HDL Ratio Piccolo,Waive: 2.9 mg/dL
Cholesterol Piccolo, Waived: 111 mg/dL
HDL Chol Piccolo, Waived: 39 mg/dL — ABNORMAL LOW
LDL Chol Calc Piccolo Waived: 2 mg/dL
Triglycerides Piccolo,Waived: 350 mg/dL — ABNORMAL HIGH
VLDL Chol Calc Piccolo,Waive: 70 mg/dL — ABNORMAL HIGH

## 2016-03-10 LAB — BAYER DCA HB A1C WAIVED: HB A1C (BAYER DCA - WAIVED): 7 % — ABNORMAL HIGH

## 2016-03-10 NOTE — Assessment & Plan Note (Signed)
The current medical regimen is effective;  continue present plan and medications.  

## 2016-03-10 NOTE — Progress Notes (Signed)
BP 136/68 (BP Location: Left Arm)   Pulse 64   Temp 98.7 F (37.1 C)   Wt 185 lb 11.2 oz (84.2 kg)   SpO2 95%   BMI 25.90 kg/m    Subjective:    Patient ID: William Ball, male    DOB: Jan 11, 1946, 70 y.o.   MRN: [id number]  HPI: William Ball is a 70 y.o. male  Chief Complaint  Patient presents with  . Diabetes  . Hyperlipidemia  . Hypertension   Patient follow-up has been switched to third shift over the last 4 days has been really tired and not sleeping well trying to adjust to the new change. had a tremor in his right hand yesterday never had tremor before hasn't had any since.  Diabetes no complaints of low blood sugar spells Blood pressure cholesterol no complaints from medications or conditions Takes medications faithfully with no side effects.  Relevant past medical, surgical, family and social history reviewed and updated as indicated. Interim medical history since our last visit reviewed. Allergies and medications reviewed and updated.  Review of Systems  Constitutional: Negative.   Respiratory: Negative.   Cardiovascular: Negative.     Per HPI unless specifically indicated above     Objective:    BP 136/68 (BP Location: Left Arm)   Pulse 64   Temp 98.7 F (37.1 C)   Wt 185 lb 11.2 oz (84.2 kg)   SpO2 95%   BMI 25.90 kg/m   Wt Readings from Last 3 Encounters:  03/10/16 185 lb 11.2 oz (84.2 kg)  12/27/15 181 lb (82.1 kg)  12/05/15 181 lb (82.1 kg)    Physical Exam  Constitutional: He is oriented to person, place, and time. He appears well-developed and well-nourished. No distress.  HENT:  Head: Normocephalic and atraumatic.  Right Ear: Hearing normal.  Left Ear: Hearing normal.  Nose: Nose normal.  Eyes: Conjunctivae and lids are normal. Right eye exhibits no discharge. Left eye exhibits no discharge. No scleral icterus.  Cardiovascular: Normal rate, regular rhythm and normal heart sounds.   Pulmonary/Chest: Effort normal and breath sounds  normal. No respiratory distress.  Musculoskeletal: Normal range of motion.  Neurological: He is alert and oriented to person, place, and time.  Skin: Skin is intact. No rash noted.  Psychiatric: He has a normal mood and affect. His speech is normal and behavior is normal. Judgment and thought content normal. Cognition and memory are normal.    Results for orders placed or performed during the hospital encounter of 12/27/15  Glucose, capillary  Result Value Ref Range   Glucose-Capillary 135 (H) 65 - 99 mg/dL      Assessment & Plan:   Problem List Items Addressed This Visit      Cardiovascular and Mediastinum   Hypertension    The current medical regimen is effective;  continue present plan and medications.         Endocrine   Diabetes mellitus without complication ([facility])    The current medical regimen is effective;  continue present plan and medications.       Relevant Orders   Bayer DCA Hb A1c Waived   Basic metabolic panel     Other   Hyperlipidemia - Primary    The current medical regimen is effective;  continue present plan and medications.       Relevant Orders   LP+ALT+AST Piccolo, Waived   Basic metabolic panel    Other Visit Diagnoses    Immunization due  Relevant Orders   Flu vaccine HIGH DOSE PF (Fluzone High dose) (Completed)       Follow up plan: Return in about 3 months (around 06/10/2016) for Physical Exam, Hemoglobin A1c.

## 2016-03-10 NOTE — Patient Instructions (Signed)

## 2016-03-11 LAB — BASIC METABOLIC PANEL WITH GFR
BUN/Creatinine Ratio: 16 (ref 10–24)
BUN: 22 mg/dL (ref 8–27)
CO2: 22 mmol/L (ref 18–29)
Calcium: 9.6 mg/dL (ref 8.6–10.2)
Chloride: 97 mmol/L (ref 96–106)
Creatinine, Ser: 1.39 mg/dL — ABNORMAL HIGH (ref 0.76–1.27)
GFR calc Af Amer: 59 mL/min/1.73 — ABNORMAL LOW
GFR calc non Af Amer: 51 mL/min/1.73 — ABNORMAL LOW
Glucose: 150 mg/dL — ABNORMAL HIGH (ref 65–99)
Potassium: 4.6 mmol/L (ref 3.5–5.2)
Sodium: 137 mmol/L (ref 134–144)

## 2016-03-20 ENCOUNTER — Encounter: Payer: Self-pay | Admitting: Family Medicine

## 2016-03-20 ENCOUNTER — Telehealth: Payer: Self-pay | Admitting: Family Medicine

## 2016-03-20 DIAGNOSIS — I10 Essential (primary) hypertension: Secondary | ICD-10-CM

## 2016-03-20 DIAGNOSIS — E119 Type 2 diabetes mellitus without complications: Secondary | ICD-10-CM

## 2016-03-20 NOTE — Telephone Encounter (Signed)
Phone call Discussed with patient's wife declining renal function patient will stop aspirin recheck BMP next office visit.

## 2016-03-20 NOTE — Telephone Encounter (Signed)
-----   Message from Moss Mc, [place] sent at 03/20/2016 11:44 AM EST ----- Phone call with wife

## 2016-06-09 ENCOUNTER — Encounter: Payer: PPO | Admitting: Family Medicine

## 2016-06-12 ENCOUNTER — Encounter: Payer: Self-pay | Admitting: Family Medicine

## 2016-06-12 ENCOUNTER — Ambulatory Visit (INDEPENDENT_AMBULATORY_CARE_PROVIDER_SITE_OTHER): Payer: Medicare HMO | Admitting: Family Medicine

## 2016-06-12 VITALS — BP 156/66 | HR 60 | Ht 70.87 in | Wt 184.0 lb

## 2016-06-12 DIAGNOSIS — R69 Illness, unspecified: Secondary | ICD-10-CM | POA: Diagnosis not present

## 2016-06-12 DIAGNOSIS — N400 Enlarged prostate without lower urinary tract symptoms: Secondary | ICD-10-CM

## 2016-06-12 DIAGNOSIS — F17210 Nicotine dependence, cigarettes, uncomplicated: Secondary | ICD-10-CM | POA: Diagnosis not present

## 2016-06-12 DIAGNOSIS — Z1211 Encounter for screening for malignant neoplasm of colon: Secondary | ICD-10-CM | POA: Diagnosis not present

## 2016-06-12 DIAGNOSIS — Z0000 Encounter for general adult medical examination without abnormal findings: Secondary | ICD-10-CM | POA: Diagnosis not present

## 2016-06-12 DIAGNOSIS — E119 Type 2 diabetes mellitus without complications: Secondary | ICD-10-CM | POA: Diagnosis not present

## 2016-06-12 DIAGNOSIS — Z1329 Encounter for screening for other suspected endocrine disorder: Secondary | ICD-10-CM

## 2016-06-12 DIAGNOSIS — M1610 Unilateral primary osteoarthritis, unspecified hip: Secondary | ICD-10-CM

## 2016-06-12 DIAGNOSIS — I739 Peripheral vascular disease, unspecified: Secondary | ICD-10-CM | POA: Diagnosis not present

## 2016-06-12 DIAGNOSIS — D692 Other nonthrombocytopenic purpura: Secondary | ICD-10-CM

## 2016-06-12 DIAGNOSIS — E785 Hyperlipidemia, unspecified: Secondary | ICD-10-CM

## 2016-06-12 DIAGNOSIS — I10 Essential (primary) hypertension: Secondary | ICD-10-CM | POA: Diagnosis not present

## 2016-06-12 LAB — BAYER DCA HB A1C WAIVED: HB A1C (BAYER DCA - WAIVED): 7 % — ABNORMAL HIGH

## 2016-06-12 LAB — URINALYSIS, ROUTINE W REFLEX MICROSCOPIC
Bilirubin, UA: NEGATIVE
Glucose, UA: NEGATIVE
Ketones, UA: NEGATIVE
Leukocytes, UA: NEGATIVE
Nitrite, UA: NEGATIVE
Protein, UA: NEGATIVE
RBC, UA: NEGATIVE
Specific Gravity, UA: 1.02 (ref 1.005–1.030)
Urobilinogen, Ur: 0.2 mg/dL (ref 0.2–1.0)
pH, UA: 5 (ref 5.0–7.5)

## 2016-06-12 MED ORDER — ATORVASTATIN CALCIUM 20 MG PO TABS: 20.0000 mg | ORAL_TABLET | Freq: Every day | ORAL | 4 refills | Status: DC

## 2016-06-12 MED ORDER — AMLODIPINE BESYLATE 10 MG PO TABS: 10.0000 mg | ORAL_TABLET | Freq: Every day | ORAL | 4 refills | Status: DC

## 2016-06-12 MED ORDER — METFORMIN HCL ER 500 MG PO TB24: 250.0000 mg | ORAL_TABLET | Freq: Every day | ORAL | 4 refills | Status: DC

## 2016-06-12 MED ORDER — MELOXICAM 15 MG PO TABS: 15.0000 mg | ORAL_TABLET | Freq: Every day | ORAL | 4 refills | Status: DC | PRN

## 2016-06-12 MED ORDER — CHLORTHALIDONE 25 MG PO TABS: 25.0000 mg | ORAL_TABLET | Freq: Every day | ORAL | 4 refills | Status: DC

## 2016-06-12 MED ORDER — LISINOPRIL 40 MG PO TABS: 40.0000 mg | ORAL_TABLET | Freq: Every day | ORAL | 4 refills | Status: DC

## 2016-06-12 MED ORDER — METOPROLOL SUCCINATE ER 50 MG PO TB24: 50.0000 mg | ORAL_TABLET | Freq: Every day | ORAL | 4 refills | Status: DC

## 2016-06-12 NOTE — Progress Notes (Signed)
BP (!) 156/66   Pulse 60   Ht 5' 10.87" (1.8 m)   Wt 184 lb (83.5 kg)   SpO2 98%   BMI 25.76 kg/m    Subjective:    Patient ID: William Ball, male    DOB: 14-Dec-1945, 71 y.o.   MRN: [id number]  HPI: William MATHIASON is a 71 y.o. male  Chief Complaint  Patient presents with  . Annual Exam  Patient's biggest concern today is having swelling of both legs feet which goes on during the day the longer he stands on the worse it gets. Is down overnight. Patient's working third shift. Is pretty much standing all night. This happens only on his right foot. Not his left. This is been ongoing pretty much all fall and winter. Was not going on prior to this. On review this problem started after having vascular surgery on his legs. And improve circulation especially in his right side.  Patient's blood pressure elevated today is been taking his medications faithfully and is taken today. Initially concerned about amlodipine and leg swelling but it's only his right leg.  Patient doing well with his cholesterol medicine taking faithfully.  Patient takes meloxicam for arthralgias which does okay. Metformin for diabetes seems to be doing okay no issues.  Patient's biggest problem is continues to smoke. He does remember problems with his legs and smoking cessation discussions from previous doctors and visits. Patient thinks it may be old. Here later this winter or early spring.  Relevant past medical, surgical, family and social history reviewed and updated as indicated. Interim medical history since our last visit reviewed. Allergies and medications reviewed and updated.  Other than above Review of Systems  Constitutional: Negative.   HENT: Negative.   Eyes: Negative.   Respiratory: Negative.   Cardiovascular: Negative.   Gastrointestinal: Negative.   Endocrine: Negative.   Genitourinary: Negative.   Musculoskeletal: Negative.   Skin: Negative.   Allergic/Immunologic: Negative.     Neurological: Negative.   Hematological: Negative.   Psychiatric/Behavioral: Negative.     Per HPI unless specifically indicated above     Objective:    BP (!) 156/66   Pulse 60   Ht 5' 10.87" (1.8 m)   Wt 184 lb (83.5 kg)   SpO2 98%   BMI 25.76 kg/m   Wt Readings from Last 3 Encounters:  06/12/16 184 lb (83.5 kg)  03/10/16 185 lb 11.2 oz (84.2 kg)  12/27/15 181 lb (82.1 kg)    Physical Exam  Constitutional: He is oriented to person, place, and time. He appears well-developed and well-nourished.  HENT:  Head: Normocephalic and atraumatic.  Right Ear: External ear normal.  Left Ear: External ear normal.  Eyes: Conjunctivae and EOM are normal. Pupils are equal, round, and reactive to light.  Neck: Normal range of motion. Neck supple.  Cardiovascular: Normal rate, regular rhythm, normal heart sounds and intact distal pulses.   Pulmonary/Chest: Effort normal and breath sounds normal.  Abdominal: Soft. Bowel sounds are normal. There is no splenomegaly or hepatomegaly.  Genitourinary: Rectum normal and penis normal.  Genitourinary Comments: BPH   Musculoskeletal: Normal range of motion.  Neurological: He is alert and oriented to person, place, and time. He has normal reflexes.  Skin: No rash noted. No erythema.  Psychiatric: He has a normal mood and affect. His behavior is normal. Judgment and thought content normal.    Results for orders placed or performed in visit on 03/10/16  Bayer DCA Hb A1c  Waived  Result Value Ref Range   Bayer DCA Hb A1c Waived 7.0 (H) <7.0 %  LP+ALT+AST Piccolo, Waived  Result Value Ref Range   ALT (SGPT) Piccolo, Waived 37 10 - 47 U/L   AST (SGOT) Piccolo, Waived 42 (H) 11 - 38 U/L   Cholesterol Piccolo, Waived 111 <200 mg/dL   HDL Chol Piccolo, Waived 39 (L) >59 mg/dL   Triglycerides Piccolo,Waived 350 (H) <150 mg/dL   Chol/HDL Ratio Piccolo,Waive 2.9 mg/dL   LDL Chol Calc Piccolo Waived 2 <100 mg/dL   VLDL Chol Calc Piccolo,Waive 70 (H)  <30 mg/dL  Basic metabolic panel  Result Value Ref Range   Glucose 150 (H) 65 - 99 mg/dL   BUN 22 8 - 27 mg/dL   Creatinine, Ser 1.39 (H) 0.76 - 1.27 mg/dL   GFR calc non Af Amer 51 (L) >59 mL/min/1.73   GFR calc Af Amer 59 (L) >59 mL/min/1.73   BUN/Creatinine Ratio 16 10 - 24   Sodium 137 134 - 144 mmol/L   Potassium 4.6 3.5 - 5.2 mmol/L   Chloride 97 96 - 106 mmol/L   CO2 22 18 - 29 mmol/L   Calcium 9.6 8.6 - 10.2 mg/dL      Assessment & Plan:   Problem List Items Addressed This Visit      Cardiovascular and Mediastinum   Hypertension    Discussed hypertension poor control patient taking medications faithfully Will increase metoprolol from 25 to 50 mg with cautions about low pulse rate. Will follow-up in about a month or so to reassess blood pressure control and pulse rate side effects.      Relevant Medications   metoprolol succinate (TOPROL-XL) 50 MG 24 hr tablet   amLODipine (NORVASC) 10 MG tablet   atorvastatin (LIPITOR) 20 MG tablet   chlorthalidone (HYGROTON) 25 MG tablet   lisinopril (PRINIVIL,ZESTRIL) 40 MG tablet   Other Relevant Orders   CBC with Differential/Platelet   Comprehensive metabolic panel   Lipid panel   Urinalysis, Routine w reflex microscopic   Bayer DCA Hb A1c Waived   Senile purpura (HCC)    Still on going but not worse      Relevant Medications   metoprolol succinate (TOPROL-XL) 50 MG 24 hr tablet   amLODipine (NORVASC) 10 MG tablet   atorvastatin (LIPITOR) 20 MG tablet   chlorthalidone (HYGROTON) 25 MG tablet   lisinopril (PRINIVIL,ZESTRIL) 40 MG tablet     Endocrine   Diabetes mellitus without complication (HCC)    The current medical regimen is effective;  continue present plan and medications.       Relevant Medications   atorvastatin (LIPITOR) 20 MG tablet   lisinopril (PRINIVIL,ZESTRIL) 40 MG tablet   metFORMIN (GLUCOPHAGE-XR) 500 MG 24 hr tablet   Other Relevant Orders   CBC with Differential/Platelet   Comprehensive  metabolic panel   Lipid panel   Urinalysis, Routine w reflex microscopic   Bayer DCA Hb A1c Waived     Musculoskeletal and Integument   Hip arthritis   Relevant Medications   meloxicam (MOBIC) 15 MG tablet     Genitourinary   BPH (benign prostatic hyperplasia)   Relevant Orders   PSA     Other   Hyperlipidemia    The current medical regimen is effective;  continue present plan and medications.       Relevant Medications   metoprolol succinate (TOPROL-XL) 50 MG 24 hr tablet   amLODipine (NORVASC) 10 MG tablet   atorvastatin (LIPITOR) 20  MG tablet   chlorthalidone (HYGROTON) 25 MG tablet   lisinopril (PRINIVIL,ZESTRIL) 40 MG tablet   Other Relevant Orders   CBC with Differential/Platelet   Comprehensive metabolic panel   Lipid panel   Urinalysis, Routine w reflex microscopic   Bayer DCA Hb A1c Waived   [facility])    Doing better after vascular intervention now with edema right leg discussed support hose and compression elevation etc.      Smoking greater than 40 pack years    Discussed CT screening will make referral.      Relevant Orders   CT CHEST LUNG CA SCREEN LOW DOSE W/O CM    Other Visit Diagnoses    Annual physical exam    -  Primary   Relevant Orders   CBC with Differential/Platelet   Comprehensive metabolic panel   Lipid panel   PSA   TSH   Urinalysis, Routine w reflex microscopic   Bayer DCA Hb A1c Waived   Thyroid disorder screen       Relevant Orders   TSH   Colon cancer screening       Relevant Orders   Ambulatory referral to Gastroenterology   Essential hypertension, benign       Relevant Medications   metoprolol succinate (TOPROL-XL) 50 MG 24 hr tablet   amLODipine (NORVASC) 10 MG tablet   atorvastatin (LIPITOR) 20 MG tablet   chlorthalidone (HYGROTON) 25 MG tablet   lisinopril (PRINIVIL,ZESTRIL) 40 MG tablet       Follow up plan: Return for BP check.

## 2016-06-12 NOTE — Assessment & Plan Note (Signed)
Discussed CT screening will make referral.

## 2016-06-12 NOTE — Assessment & Plan Note (Signed)
Doing better after vascular intervention now with edema right leg discussed support hose and compression elevation etc.

## 2016-06-12 NOTE — Assessment & Plan Note (Signed)
Discussed hypertension poor control patient taking medications faithfully Will increase metoprolol from 25 to 50 mg with cautions about low pulse rate. Will follow-up in about a month or so to reassess blood pressure control and pulse rate side effects.

## 2016-06-12 NOTE — Assessment & Plan Note (Signed)
Still on going but not worse

## 2016-06-12 NOTE — Assessment & Plan Note (Signed)
The current medical regimen is effective;  continue present plan and medications.  

## 2016-06-13 ENCOUNTER — Other Ambulatory Visit (INDEPENDENT_AMBULATORY_CARE_PROVIDER_SITE_OTHER): Payer: Self-pay | Admitting: Vascular Surgery

## 2016-06-13 DIAGNOSIS — E785 Hyperlipidemia, unspecified: Secondary | ICD-10-CM

## 2016-06-13 LAB — LIPID PANEL
Chol/HDL Ratio: 3.5 ratio (ref 0.0–5.0)
Cholesterol, Total: 111 mg/dL (ref 100–199)
HDL: 32 mg/dL — ABNORMAL LOW
LDL Calculated: 18 mg/dL (ref 0–99)
Triglycerides: 306 mg/dL — ABNORMAL HIGH (ref 0–149)
VLDL Cholesterol Cal: 61 mg/dL — ABNORMAL HIGH (ref 5–40)

## 2016-06-13 LAB — CBC WITH DIFFERENTIAL/PLATELET
Basophils Absolute: 0.1 x10E3/uL (ref 0.0–0.2)
Basos: 1 %
EOS (ABSOLUTE): 0.5 x10E3/uL — ABNORMAL HIGH (ref 0.0–0.4)
Eos: 8 %
Hematocrit: 41.1 % (ref 37.5–51.0)
Hemoglobin: 13.5 g/dL (ref 13.0–17.7)
Immature Grans (Abs): 0 x10E3/uL (ref 0.0–0.1)
Immature Granulocytes: 1 %
Lymphocytes Absolute: 2.2 x10E3/uL (ref 0.7–3.1)
Lymphs: 33 %
MCH: 30.7 pg (ref 26.6–33.0)
MCHC: 32.8 g/dL (ref 31.5–35.7)
MCV: 93 fL (ref 79–97)
Monocytes Absolute: 0.7 x10E3/uL (ref 0.1–0.9)
Monocytes: 11 %
Neutrophils Absolute: 3.1 x10E3/uL (ref 1.4–7.0)
Neutrophils: 46 %
Platelets: 243 x10E3/uL (ref 150–379)
RBC: 4.4 x10E6/uL (ref 4.14–5.80)
RDW: 13 % (ref 12.3–15.4)
WBC: 6.6 x10E3/uL (ref 3.4–10.8)

## 2016-06-13 LAB — COMPREHENSIVE METABOLIC PANEL WITH GFR
ALT: 32 IU/L (ref 0–44)
AST: 28 IU/L (ref 0–40)
Albumin/Globulin Ratio: 1.6 (ref 1.2–2.2)
Albumin: 4.5 g/dL (ref 3.5–4.8)
Alkaline Phosphatase: 95 IU/L (ref 39–117)
BUN/Creatinine Ratio: 28 — ABNORMAL HIGH (ref 10–24)
BUN: 29 mg/dL — ABNORMAL HIGH (ref 8–27)
Bilirubin Total: 0.3 mg/dL (ref 0.0–1.2)
CO2: 20 mmol/L (ref 18–29)
Calcium: 9.4 mg/dL (ref 8.6–10.2)
Chloride: 100 mmol/L (ref 96–106)
Creatinine, Ser: 1.05 mg/dL (ref 0.76–1.27)
GFR calc Af Amer: 82 mL/min/1.73
GFR calc non Af Amer: 71 mL/min/1.73
Globulin, Total: 2.8 g/dL (ref 1.5–4.5)
Glucose: 135 mg/dL — ABNORMAL HIGH (ref 65–99)
Potassium: 4.3 mmol/L (ref 3.5–5.2)
Sodium: 139 mmol/L (ref 134–144)
Total Protein: 7.3 g/dL (ref 6.0–8.5)

## 2016-06-13 LAB — PSA: Prostate Specific Ag, Serum: 0.5 ng/mL (ref 0.0–4.0)

## 2016-06-13 LAB — TSH: TSH: 4.75 u[IU]/mL — ABNORMAL HIGH (ref 0.450–4.500)

## 2016-06-16 ENCOUNTER — Telehealth: Payer: Self-pay | Admitting: *Deleted

## 2016-06-16 DIAGNOSIS — Z87891 Personal history of nicotine dependence: Secondary | ICD-10-CM

## 2016-06-16 NOTE — Telephone Encounter (Signed)
Received referral for initial lung cancer screening scan. Contacted patient and obtained smoking history,(current, 82.5 pack year) as well as answering questions related to screening process. Patient denies signs of lung cancer such as weight loss or hemoptysis. Patient denies comorbidity that would prevent curative treatment if lung cancer were found. Patient is tentatively scheduled for shared decision making visit and CT scan on 06/25/16, pending insurance approval from business office.

## 2016-06-17 ENCOUNTER — Telehealth: Payer: Self-pay | Admitting: Family Medicine

## 2016-06-17 DIAGNOSIS — R7989 Other specified abnormal findings of blood chemistry: Secondary | ICD-10-CM

## 2016-06-17 NOTE — Telephone Encounter (Signed)
Phone call Discussed with patient slight elevation TSH patient will come back in 2-3 months to recheck TSH.

## 2016-06-19 ENCOUNTER — Other Ambulatory Visit: Payer: Self-pay

## 2016-06-24 ENCOUNTER — Telehealth: Payer: Self-pay

## 2016-06-24 NOTE — Telephone Encounter (Signed)
Gastroenterology Pre-Procedure Review  Request Date:  Requesting Physician: Dr.   PATIENT REVIEW QUESTIONS: The patient responded to the following health history questions as indicated:    1. Are you having any GI issues? no 2. Do you have a personal history of Polyps? no 3. Do you have a family history of Colon Cancer or Polyps? no 4. Diabetes Mellitus? yes (Type 2) 5. Joint replacements in the past 12 months?no 6. Major health problems in the past 3 months?no 7. Any artificial heart valves, MVP, or defibrillator?yes (angioplasty by Dr. Lucky Cowboy on 12/2015)    MEDICATIONS & ALLERGIES:    Patient reports the following regarding taking any anticoagulation/antiplatelet therapy:   Plavix, Coumadin, Eliquis, Xarelto, Lovenox, Pradaxa, Brilinta, or Effient? yes (plavix 75mg ) Aspirin? no  Patient confirms/reports the following medications:  Current Outpatient Prescriptions  Medication Sig Dispense Refill  . amLODipine (NORVASC) 10 MG tablet Take 1 tablet (10 mg total) by mouth daily. 90 tablet 4  . atorvastatin (LIPITOR) 20 MG tablet Take 1 tablet (20 mg total) by mouth daily. 90 tablet 4  . chlorthalidone (HYGROTON) 25 MG tablet Take 1 tablet (25 mg total) by mouth daily. 90 tablet 4  . clopidogrel (PLAVIX) 75 MG tablet Take 1 tablet (75 mg total) by mouth daily. 30 tablet 11  . lisinopril (PRINIVIL,ZESTRIL) 40 MG tablet Take 1 tablet (40 mg total) by mouth daily. 90 tablet 4  . meloxicam (MOBIC) 15 MG tablet Take 1 tablet (15 mg total) by mouth daily as needed for pain. 90 tablet 4  . metFORMIN (GLUCOPHAGE-XR) 500 MG 24 hr tablet Take 0.5 tablets (250 mg total) by mouth daily. 45 tablet 4  . metoprolol succinate (TOPROL-XL) 50 MG 24 hr tablet Take 1 tablet (50 mg total) by mouth daily. 90 tablet 4   No current facility-administered medications for this visit.     Patient confirms/reports the following allergies:  No Known Allergies  No orders of the defined types were placed in this  encounter.   AUTHORIZATION INFORMATION Primary Insurance: 1D#: Group #:  Secondary Insurance: 1D#: Group #:  SCHEDULE INFORMATION: Date: 07/08/16 Time: Location: [place]

## 2016-06-25 ENCOUNTER — Ambulatory Visit
Admission: RE | Admit: 2016-06-25 | Discharge: 2016-06-25 | Disposition: A | Discharge status: Home / Self Care | Payer: Medicare HMO | Source: Ambulatory Visit | Attending: Oncology | Admitting: Oncology

## 2016-06-25 ENCOUNTER — Inpatient Hospital Stay: Payer: Medicare HMO | Attending: Oncology | Admitting: Oncology

## 2016-06-25 DIAGNOSIS — R69 Illness, unspecified: Secondary | ICD-10-CM | POA: Diagnosis not present

## 2016-06-25 DIAGNOSIS — I700 Atherosclerosis of aorta: Secondary | ICD-10-CM | POA: Insufficient documentation

## 2016-06-25 DIAGNOSIS — J439 Emphysema, unspecified: Secondary | ICD-10-CM | POA: Insufficient documentation

## 2016-06-25 DIAGNOSIS — I2510 Atherosclerotic heart disease of native coronary artery without angina pectoris: Secondary | ICD-10-CM | POA: Insufficient documentation

## 2016-06-25 DIAGNOSIS — Z87891 Personal history of nicotine dependence: Secondary | ICD-10-CM | POA: Diagnosis not present

## 2016-06-25 DIAGNOSIS — K760 Fatty (change of) liver, not elsewhere classified: Secondary | ICD-10-CM | POA: Insufficient documentation

## 2016-06-25 NOTE — Assessment & Plan Note (Signed)
In accordance with CMS guidelines, patient has met eligibility criteria including age, absence of signs or symptoms of lung cancer.  Social History  Substance Use Topics  . Smoking status: Current Every Day Smoker    Packs/day: 1.50    Years: 55.00    Types: Cigarettes  . Smokeless tobacco: Never Used  . Alcohol use Yes     Comment: 4+ daily beer     A shared decision-making session was conducted prior to the performance of CT scan. This includes one or more decision aids, includes benefits and harms of screening, follow-up diagnostic testing, over-diagnosis, false positive rate, and total radiation exposure.  Counseling on the importance of adherence to annual lung cancer LDCT screening, impact of co-morbidities, and ability or willingness to undergo diagnosis and treatment is imperative for compliance of the program.  Counseling on the importance of continued smoking cessation for former smokers; the importance of smoking cessation for current smokers, and information about tobacco cessation interventions have been given to patient including [place] and [facility] quit [place] programs.  Written order for lung cancer screening with LDCT has been given to the patient and any and all questions have been answered to the best of my abilities.   Yearly follow up will be coordinated by Burgess Estelle, Thoracic Navigator.

## 2016-06-25 NOTE — Progress Notes (Signed)
Personal history of tobacco use, presenting hazards to health In accordance with CMS guidelines, patient has met eligibility criteria including age, absence of signs or symptoms of lung cancer.  Social History  Substance Use Topics  . Smoking status: Current Every Day Smoker    Packs/day: 1.50    Years: 55.00    Types: Cigarettes  . Smokeless tobacco: Never Used  . Alcohol use Yes     Comment: 4+ daily beer     A shared decision-making session was conducted prior to the performance of CT scan. This includes one or more decision aids, includes benefits and harms of screening, follow-up diagnostic testing, over-diagnosis, false positive rate, and total radiation exposure.  Counseling on the importance of adherence to annual lung cancer LDCT screening, impact of co-morbidities, and ability or willingness to undergo diagnosis and treatment is imperative for compliance of the program.  Counseling on the importance of continued smoking cessation for former smokers; the importance of smoking cessation for current smokers, and information about tobacco cessation interventions have been given to patient including [place] and [facility] quit [place] programs.  Written order for lung cancer screening with LDCT has been given to the patient and any and all questions have been answered to the best of my abilities.   Yearly follow up will be coordinated by Burgess Estelle, Thoracic Navigator.  Lucendia Herrlich, NP  06/25/16 3:15 PM

## 2016-06-30 ENCOUNTER — Encounter: Payer: Self-pay | Admitting: *Deleted

## 2016-07-08 ENCOUNTER — Telehealth: Payer: Self-pay

## 2016-07-08 NOTE — Telephone Encounter (Signed)
Called to advise pt of blood thinner release instructions.   Pt cleared to stop blood thinner 5 days prior to procedure: 07/10/16.  Unable to leave voicemail.

## 2016-07-15 ENCOUNTER — Ambulatory Visit: Admission: RE | Admit: 2016-07-15 | Payer: Medicare HMO | Source: Ambulatory Visit | Admitting: Gastroenterology

## 2016-07-15 ENCOUNTER — Other Ambulatory Visit: Payer: Self-pay

## 2016-07-15 ENCOUNTER — Encounter: Admission: RE | Payer: Self-pay | Source: Ambulatory Visit

## 2016-07-15 DIAGNOSIS — Z1211 Encounter for screening for malignant neoplasm of colon: Secondary | ICD-10-CM

## 2016-07-15 SURGERY — COLONOSCOPY WITH PROPOFOL
Anesthesia: General

## 2016-07-17 ENCOUNTER — Ambulatory Visit
Admission: RE | Admit: 2016-07-17 | Discharge: 2016-07-17 | Disposition: A | Discharge status: Home / Self Care | Payer: Medicare HMO | Source: Ambulatory Visit | Attending: Gastroenterology | Admitting: Gastroenterology

## 2016-07-17 ENCOUNTER — Ambulatory Visit: Payer: Medicare HMO | Admitting: Anesthesiology

## 2016-07-17 ENCOUNTER — Encounter: Payer: Self-pay | Admitting: *Deleted

## 2016-07-17 ENCOUNTER — Encounter
Admission: RE | Disposition: A | Discharge status: Home / Self Care | Payer: Self-pay | Source: Ambulatory Visit | Attending: Gastroenterology

## 2016-07-17 DIAGNOSIS — K640 First degree hemorrhoids: Secondary | ICD-10-CM | POA: Diagnosis not present

## 2016-07-17 DIAGNOSIS — D124 Benign neoplasm of descending colon: Secondary | ICD-10-CM | POA: Diagnosis not present

## 2016-07-17 DIAGNOSIS — E785 Hyperlipidemia, unspecified: Secondary | ICD-10-CM | POA: Insufficient documentation

## 2016-07-17 DIAGNOSIS — Z7984 Long term (current) use of oral hypoglycemic drugs: Secondary | ICD-10-CM | POA: Insufficient documentation

## 2016-07-17 DIAGNOSIS — J449 Chronic obstructive pulmonary disease, unspecified: Secondary | ICD-10-CM | POA: Diagnosis not present

## 2016-07-17 DIAGNOSIS — Z79899 Other long term (current) drug therapy: Secondary | ICD-10-CM | POA: Insufficient documentation

## 2016-07-17 DIAGNOSIS — Z955 Presence of coronary angioplasty implant and graft: Secondary | ICD-10-CM | POA: Diagnosis not present

## 2016-07-17 DIAGNOSIS — I252 Old myocardial infarction: Secondary | ICD-10-CM | POA: Diagnosis not present

## 2016-07-17 DIAGNOSIS — Z1211 Encounter for screening for malignant neoplasm of colon: Secondary | ICD-10-CM | POA: Diagnosis not present

## 2016-07-17 DIAGNOSIS — D122 Benign neoplasm of ascending colon: Secondary | ICD-10-CM | POA: Diagnosis not present

## 2016-07-17 DIAGNOSIS — I10 Essential (primary) hypertension: Secondary | ICD-10-CM | POA: Insufficient documentation

## 2016-07-17 DIAGNOSIS — D125 Benign neoplasm of sigmoid colon: Secondary | ICD-10-CM

## 2016-07-17 DIAGNOSIS — E119 Type 2 diabetes mellitus without complications: Secondary | ICD-10-CM | POA: Diagnosis not present

## 2016-07-17 DIAGNOSIS — K649 Unspecified hemorrhoids: Secondary | ICD-10-CM | POA: Diagnosis not present

## 2016-07-17 DIAGNOSIS — K219 Gastro-esophageal reflux disease without esophagitis: Secondary | ICD-10-CM | POA: Diagnosis not present

## 2016-07-17 DIAGNOSIS — K635 Polyp of colon: Secondary | ICD-10-CM | POA: Diagnosis not present

## 2016-07-17 DIAGNOSIS — R69 Illness, unspecified: Secondary | ICD-10-CM | POA: Diagnosis not present

## 2016-07-17 DIAGNOSIS — Z7189 Other specified counseling: Secondary | ICD-10-CM

## 2016-07-17 DIAGNOSIS — F17210 Nicotine dependence, cigarettes, uncomplicated: Secondary | ICD-10-CM | POA: Insufficient documentation

## 2016-07-17 HISTORY — PX: COLONOSCOPY WITH PROPOFOL: SHX5780

## 2016-07-17 LAB — GLUCOSE, CAPILLARY: Glucose-Capillary: 131 mg/dL — ABNORMAL HIGH (ref 65–99)

## 2016-07-17 SURGERY — COLONOSCOPY WITH PROPOFOL
Anesthesia: General

## 2016-07-17 MED ORDER — SODIUM CHLORIDE 0.9 % IV SOLN
INTRAVENOUS | Status: DC
  Administered 2016-07-17: 10:00:00 via INTRAVENOUS

## 2016-07-17 MED ORDER — PROPOFOL 500 MG/50ML IV EMUL
INTRAVENOUS | Status: DC | PRN
  Administered 2016-07-17: 125 ug/kg/min via INTRAVENOUS

## 2016-07-17 MED ORDER — PROPOFOL 10 MG/ML IV BOLUS
INTRAVENOUS | Status: DC | PRN
  Administered 2016-07-17: 20 mg via INTRAVENOUS
  Administered 2016-07-17: 50 mg via INTRAVENOUS

## 2016-07-17 NOTE — Transfer of Care (Signed)
Immediate Anesthesia Transfer of Care Note  Patient: William Ball  Procedure(s) Performed: Procedure(s): COLONOSCOPY WITH PROPOFOL (N/A)  Patient Location: PACU  Anesthesia Type:General  Level of Consciousness: sedated  Airway & Oxygen Therapy: Patient Spontanous Breathing and Patient connected to nasal cannula oxygen  Post-op Assessment: Report given to RN and Post -op Vital signs reviewed and stable  Post vital signs: Reviewed and stable  Last Vitals:  Vitals:   07/17/16 0925 07/17/16 1115  BP: (!) 151/57 128/62  Pulse: (!) 56 60  Resp: 14 16  Temp: 36.8 C     Last Pain:  Vitals:   07/17/16 0925  TempSrc: Tympanic         Complications: No apparent anesthesia complications

## 2016-07-17 NOTE — Anesthesia Preprocedure Evaluation (Signed)
Anesthesia Evaluation  Patient identified by MRN, date of birth, ID band Patient awake    Reviewed: Allergy & Precautions, H&P , NPO status , Patient's Chart, lab work & pertinent test results, reviewed documented beta blocker date and time   History of Anesthesia Complications Negative for: history of anesthetic complications  Airway Mallampati: II  TM Distance: >3 FB Neck ROM: full    Dental  (+) Edentulous Upper, Edentulous Lower, Upper Dentures   Pulmonary neg shortness of breath, neg sleep apnea, COPD (mild), neg recent URI, Current Smoker,           Cardiovascular Exercise Tolerance: Good hypertension, (-) angina(-) CAD, (-) Past MI, (-) Cardiac Stents and (-) CABG (-) dysrhythmias (-) Valvular Problems/Murmurs     Neuro/Psych negative neurological ROS  negative psych ROS   GI/Hepatic Neg liver ROS, GERD  ,  Endo/Other  diabetes (borderline)  Renal/GU negative Renal ROS  negative genitourinary   Musculoskeletal   Abdominal   Peds  Hematology negative hematology ROS (+)   Anesthesia Other Findings Past Medical History: No date: Cough     Comment: CHRONIC DRY No date: Diabetes mellitus without complication (HCC)     Comment: borderline No date: GERD (gastroesophageal reflux disease) No date: Hepatomegaly No date: Hyperlipidemia No date: Hypertension No date: Substance abuse   Reproductive/Obstetrics negative OB ROS                             Anesthesia Physical Anesthesia Plan  ASA: II  Anesthesia Plan: General   Post-op Pain Management:    Induction:   Airway Management Planned:   Additional Equipment:   Intra-op Plan:   Post-operative Plan:   Informed Consent: I have reviewed the patients History and Physical, chart, labs and discussed the procedure including the risks, benefits and alternatives for the proposed anesthesia with the patient or authorized  representative who has indicated his/her understanding and acceptance.   Dental Advisory Given  Plan Discussed with: Anesthesiologist, CRNA and Surgeon  Anesthesia Plan Comments:         Anesthesia Quick Evaluation

## 2016-07-17 NOTE — H&P (Signed)
[name] MD [address] [place], [place] [address] Phone: [phone] Fax : [phone]  Primary Care Physician:  Golden Pop, MD Primary Gastroenterologist:  Dr. [name]   Pre-Procedure History & Physical: HPI:  William Ball is a 71 y.o. male is here for an colonoscopy.   Past Medical History:  Diagnosis Date  . Cough    CHRONIC DRY  . Diabetes mellitus without complication (HCC)    borderline  . GERD (gastroesophageal reflux disease)   . Hepatomegaly   . Hyperlipidemia   . Hypertension   . Substance abuse     Past Surgical History:  Procedure Laterality Date  . APPENDECTOMY    . BONE MARROW TRANSPLANT Left    ankle.  patient unsure of this but thinks this is what happened  . CATARACT EXTRACTION W/PHACO Left 11/13/2015   Procedure: CATARACT EXTRACTION PHACO AND INTRAOCULAR LENS PLACEMENT (IOC);  Surgeon: [name], MD;  Location: [facility];  Service: Ophthalmology;  Laterality: Left;  [place] 1.06 AP% 23.9 CDE 15.81 Fluid pack lot # [id number]  . CATARACT EXTRACTION W/PHACO Right 11/27/2015   Procedure: CATARACT EXTRACTION PHACO AND INTRAOCULAR LENS PLACEMENT (IOC);  Surgeon: [name], MD;  Location: [facility] OR[facility];  Service: Ophthalmology;  Laterality: Right;  [place] 1.06 AP% 23.8 CDE 15.80 Fluid pack lot # [id number]  . COLONOSCOPY  2012   polyps, diverticulosis , 98yr repeat  . FRACTURE SURGERY Left 1982   foot, leg.unsure if metal in these parts.possibly replaced ankle  . PERIPHERAL VASCULAR CATHETERIZATION Left 12/27/2015   Procedure: Lower Extremity Angiography;  Surgeon: Algernon Huxley, MD;  Location: [facility];  Service: Cardiovascular;  Laterality: Left;    Prior to Admission medications   Medication Sig Start Date End Date Taking? Authorizing Provider  amLODipine (NORVASC) 10 MG tablet Take 1 tablet (10 mg total) by mouth daily. 06/12/16  Yes Guadalupe Maple, MD  atorvastatin (LIPITOR) 20 MG tablet Take 1 tablet (20 mg total) by mouth  daily. 06/12/16  Yes Guadalupe Maple, MD  chlorthalidone (HYGROTON) 25 MG tablet Take 1 tablet (25 mg total) by mouth daily. 06/12/16  Yes Guadalupe Maple, MD  lisinopril (PRINIVIL,ZESTRIL) 40 MG tablet Take 1 tablet (40 mg total) by mouth daily. 06/12/16  Yes Guadalupe Maple, MD  meloxicam (MOBIC) 15 MG tablet Take 1 tablet (15 mg total) by mouth daily as needed for pain. 06/12/16  Yes Guadalupe Maple, MD  metFORMIN (GLUCOPHAGE-XR) 500 MG 24 hr tablet Take 0.5 tablets (250 mg total) by mouth daily. 06/12/16  Yes Guadalupe Maple, MD  metoprolol succinate (TOPROL-XL) 25 MG 24 hr tablet 50 mg.  05/18/16  Yes Historical Provider, MD  clopidogrel (PLAVIX) 75 MG tablet Take 1 tablet (75 mg total) by mouth daily. 12/27/15   Algernon Huxley, MD    Allergies as of 07/15/2016  . (No Known Allergies)    Family History  Problem Relation Age of Onset  . Cancer Mother   . Heart disease Mother   . Hyperlipidemia Mother   . Heart disease Father   . Hyperlipidemia Father     Social History   Social History  . Marital status: Married    Spouse name: N/A  . Number of children: N/A  . Years of education: N/A   Occupational History  . Not on file.   Social History Main Topics  . Smoking status: Current Every Day Smoker    Packs/day: 1.50    Years: 55.00  Types: Cigarettes  . Smokeless tobacco: Never Used  . Alcohol use Yes     Comment: 4+ daily beer  . Drug use: No  . Sexual activity: Not on file   Other Topics Concern  . Not on file   Social History Narrative  . No narrative on file    Review of Systems: See HPI, otherwise negative ROS  Physical Exam: BP (!) 151/57   Pulse (!) 56   Temp 98.3 F (36.8 C) (Tympanic)   Resp 14   Ht 5\' 11"  (1.803 m)   Wt 183 lb (83 kg)   SpO2 99%   BMI 25.52 kg/m  General:   Alert,  pleasant and cooperative in NAD Head:  Normocephalic and atraumatic. Neck:  Supple; no masses or thyromegaly. Lungs:  Clear throughout to auscultation.    Heart:  Regular  rate and rhythm. Abdomen:  Soft, nontender and nondistended. Normal bowel sounds, without guarding, and without rebound.   Neurologic:  Alert and  oriented x4;  grossly normal neurologically.  Impression/Plan: William Ball is here for an colonoscopy to be performed for Screening colonoscopy average risk    Risks, benefits, limitations, and alternatives regarding  colonoscopy have been reviewed with the patient.  Questions have been answered.  All parties agreeable.   [name], MD  07/17/2016, 10:43 AM

## 2016-07-17 NOTE — Anesthesia Postprocedure Evaluation (Signed)
Anesthesia Post Note  Patient: William Ball  Procedure(s) Performed: Procedure(s) (LRB): COLONOSCOPY WITH PROPOFOL (N/A)  Patient location during evaluation: Endoscopy Anesthesia Type: General Level of consciousness: awake and alert Pain management: pain level controlled Vital Signs Assessment: post-procedure vital signs reviewed and stable Respiratory status: spontaneous breathing, nonlabored ventilation, respiratory function stable and patient connected to nasal cannula oxygen Cardiovascular status: blood pressure returned to baseline and stable Postop Assessment: no signs of nausea or vomiting Anesthetic complications: no     Last Vitals:  Vitals:   07/17/16 1125 07/17/16 1135  BP: (!) 134/58 (!) 145/67  Pulse: (!) 45   Resp: (!) 23 16  Temp:      Last Pain:  Vitals:   07/17/16 1114  TempSrc: Tympanic                 Martha Clan

## 2016-07-17 NOTE — Anesthesia Procedure Notes (Signed)
Performed by: Geanine Vandekamp Pre-anesthesia Checklist: Patient identified, Emergency Drugs available, Suction available, Patient being monitored and Timeout performed Patient Re-evaluated:Patient Re-evaluated prior to inductionOxygen Delivery Method: Nasal cannula Preoxygenation: Pre-oxygenation with 100% oxygen Intubation Type: IV induction       

## 2016-07-17 NOTE — Anesthesia Post-op Follow-up Note (Cosign Needed)
Anesthesia QCDR form completed.        

## 2016-07-17 NOTE — Op Note (Signed)
[facility] Gastroenterology Patient Name: William Ball Procedure Date: 07/17/2016 10:35 AM MRN: [id number] Account #: [id number] Date of Birth: 1945-11-15 Admit Type: Outpatient Age: 71 Room: [facility] ENDO ROOM 4 Gender: Male Note Status: Finalized Procedure:            Colonoscopy Indications:          Screening for colorectal malignant neoplasm Providers:            [name] MD, MD Referring MD:         Guadalupe Maple, MD (Referring MD) Medicines:            Monitored Anesthesia Care Complications:        No immediate complications. Procedure:            Pre-Anesthesia Assessment:                       - Prior to the procedure, a History and Physical was                        performed, and patient medications, allergies and                        sensitivities were reviewed. The patient's tolerance of                        previous anesthesia was reviewed.                       - The risks and benefits of the procedure and the                        sedation options and risks were discussed with the                        patient. All questions were answered and informed                        consent was obtained.                       - ASA Grade Assessment: II - A patient with mild                        systemic disease.                       After obtaining informed consent, the colonoscope was                        passed under direct vision. Throughout the procedure,                        the patient's blood pressure, pulse, and oxygen                        saturations were monitored continuously. The                        Colonoscope was introduced through the anus and  advanced to the the cecum, identified by the                        appendiceal orifice, IC valve and transillumination.                        The colonoscopy was performed with ease. The patient                        tolerated the procedure well. The  quality of the bowel                        preparation was good. Findings:      The perianal and digital rectal examinations were normal.      Non-bleeding internal hemorrhoids were found during retroflexion. The       hemorrhoids were small and Grade I (internal hemorrhoids that do not       prolapse).      Four sessile polyps were found in the sigmoid colon and descending       colon. The polyps were 5 to 6 mm in size. These polyps were removed with       a cold snare. Resection and retrieval were complete.      A 6 mm polyp was found in the ascending colon. The polyp was sessile.       The polyp was removed with a cold snare. Resection and retrieval were       complete.      The exam was otherwise without abnormality on direct and retroflexion       views. Impression:           - Non-bleeding internal hemorrhoids.                       - Four 5 to 6 mm polyps in the sigmoid colon and in the                        descending colon, removed with a cold snare. Resected                        and retrieved.                       - One 6 mm polyp in the ascending colon, removed with a                        cold snare. Resected and retrieved.                       - The examination was otherwise normal on direct and                        retroflexion views. Recommendation:       - Discharge patient to home (with escort).                       - Resume previous diet.                       - Continue present medications.                       -  Await pathology results.                       - Repeat colonoscopy for surveillance based on                        pathology results. Procedure Code(s):    --- Professional ---                       [phone], Colonoscopy, flexible; with removal of tumor(s),                        polyp(s), or other lesion(s) by snare technique Diagnosis Code(s):    --- Professional ---                       Z12.11, Encounter for screening for malignant neoplasm                         of colon                       D12.5, Benign neoplasm of sigmoid colon                       D12.4, Benign neoplasm of descending colon                       D12.2, Benign neoplasm of ascending colon                       K64.0, First degree hemorrhoids CPT copyright 2016 American Medical Association. All rights reserved. The codes documented in this report are preliminary and upon coder review may  be revised to meet current compliance requirements. [name], MD [name] MD, MD 07/17/2016 11:13:40 AM This report has been signed electronically. Number of Addenda: 0 Note Initiated On: 07/17/2016 10:35 AM Scope Withdrawal Time: 0 hours 17 minutes 42 seconds  Total Procedure Duration: 0 hours 20 minutes 45 seconds       [facility]

## 2016-07-18 ENCOUNTER — Encounter: Payer: Self-pay | Admitting: Gastroenterology

## 2016-07-18 LAB — SURGICAL PATHOLOGY

## 2016-07-25 ENCOUNTER — Encounter (INDEPENDENT_AMBULATORY_CARE_PROVIDER_SITE_OTHER): Payer: Self-pay

## 2016-07-25 ENCOUNTER — Ambulatory Visit (INDEPENDENT_AMBULATORY_CARE_PROVIDER_SITE_OTHER): Payer: Self-pay | Admitting: Vascular Surgery

## 2016-07-28 ENCOUNTER — Encounter: Payer: Self-pay | Admitting: Family Medicine

## 2016-07-28 ENCOUNTER — Ambulatory Visit (INDEPENDENT_AMBULATORY_CARE_PROVIDER_SITE_OTHER): Payer: Medicare HMO | Admitting: Family Medicine

## 2016-07-28 DIAGNOSIS — F17210 Nicotine dependence, cigarettes, uncomplicated: Secondary | ICD-10-CM | POA: Diagnosis not present

## 2016-07-28 DIAGNOSIS — I10 Essential (primary) hypertension: Secondary | ICD-10-CM

## 2016-07-28 DIAGNOSIS — I700 Atherosclerosis of aorta: Secondary | ICD-10-CM | POA: Diagnosis not present

## 2016-07-28 DIAGNOSIS — I739 Peripheral vascular disease, unspecified: Secondary | ICD-10-CM | POA: Diagnosis not present

## 2016-07-28 DIAGNOSIS — R69 Illness, unspecified: Secondary | ICD-10-CM | POA: Diagnosis not present

## 2016-07-28 MED ORDER — METOPROLOL SUCCINATE ER 50 MG PO TB24: 50.0000 mg | ORAL_TABLET | Freq: Every day | ORAL | 4 refills | Status: DC

## 2016-07-28 NOTE — Assessment & Plan Note (Signed)
Discuss screening CT for lung cancer for smoking risk patient will be on yearly schedule for CT screening.

## 2016-07-28 NOTE — Assessment & Plan Note (Signed)
Claudication improved after surgery with continued right leg edema patient will use support hose when he is ready discussed that that's the main treatment for now.

## 2016-07-28 NOTE — Progress Notes (Signed)
   BP 139/62   Pulse (!) 58   Ht 5\' 11"  (1.803 m)   Wt 185 lb 9.6 oz (84.2 kg)   SpO2 98%   BMI 25.89 kg/m    Subjective:    Patient ID: William Ball, male    DOB: 1945/10/04, 71 y.o.   MRN: [id number]  HPI: William Ball is a 71 y.o. male  Chief Complaint  Patient presents with  . Follow-up  . Hypertension  Patient follow-up with changes in metoprolol from 25-50 blood pressure much better with no side effects. No complaints from medications. Is complaining that right leg still swells but not wearing support hose as recommended. On review and discussion it's doubtful patient will do that unless it becomes more bothersome. Reviewed CT scan which was normal and recommending yearly CT scan of note aortic atherosclerosis was noted.  Relevant past medical, surgical, family and social history reviewed and updated as indicated. Interim medical history since our last visit reviewed. Allergies and medications reviewed and updated.  Review of Systems  Constitutional: Negative.   Respiratory: Negative.   Cardiovascular: Negative.     Per HPI unless specifically indicated above     Objective:    BP 139/62   Pulse (!) 58   Ht 5\' 11"  (1.803 m)   Wt 185 lb 9.6 oz (84.2 kg)   SpO2 98%   BMI 25.89 kg/m   Wt Readings from Last 3 Encounters:  07/28/16 185 lb 9.6 oz (84.2 kg)  07/17/16 183 lb (83 kg)  06/25/16 183 lb (83 kg)    Physical Exam  Constitutional: He is oriented to person, place, and time. He appears well-developed and well-nourished.  HENT:  Head: Normocephalic and atraumatic.  Eyes: Conjunctivae and EOM are normal.  Neck: Normal range of motion.  Cardiovascular: Normal rate, regular rhythm and normal heart sounds.   Pulmonary/Chest: Effort normal and breath sounds normal.  Musculoskeletal: Normal range of motion.  Neurological: He is alert and oriented to person, place, and time.  Skin: No erythema.  Psychiatric: He has a normal mood and affect. His behavior is  normal. Judgment and thought content normal.        Assessment & Plan:   Problem List Items Addressed This Visit      Cardiovascular and Mediastinum   Hypertension    The current medical regimen is effective;  continue present plan and medications.       Relevant Medications   metoprolol succinate (TOPROL-XL) 50 MG 24 hr tablet   Aortic atherosclerosis (HCC)   Relevant Medications   metoprolol succinate (TOPROL-XL) 50 MG 24 hr tablet     Other   Claudication ([place])    Claudication improved after surgery with continued right leg edema patient will use support hose when he is ready discussed that that's the main treatment for now.      Smoking greater than 40 pack years    Discuss screening CT for lung cancer for smoking risk patient will be on yearly schedule for CT screening.          Follow up plan: Return in about 2 months (around 09/27/2016) for Hemoglobin A1c.

## 2016-07-28 NOTE — Assessment & Plan Note (Signed)
The current medical regimen is effective;  continue present plan and medications.  

## 2016-08-12 ENCOUNTER — Other Ambulatory Visit: Payer: Self-pay | Admitting: Family Medicine

## 2016-08-12 DIAGNOSIS — I10 Essential (primary) hypertension: Secondary | ICD-10-CM

## 2016-08-12 NOTE — Telephone Encounter (Signed)
Last OV: 07/28/16 Next OV: 10/30/16  BMP Latest Ref Rng & Units 06/12/2016 03/10/2016 12/26/2015  Glucose 65 - 99 mg/dL 135(H) 150(H) -  BUN 8 - 27 mg/dL 29(H) 22 28(H)  Creatinine 0.76 - 1.27 mg/dL 1.05 1.39(H) 1.15  BUN/Creat Ratio 10 - 24 28(H) 16 -  Sodium 134 - 144 mmol/L 139 137 -  Potassium 3.5 - 5.2 mmol/L 4.3 4.6 -  Chloride 96 - 106 mmol/L 100 97 -  CO2 18 - 29 mmol/L 20 22 -  Calcium 8.6 - 10.2 mg/dL 9.4 9.6 -

## 2016-09-05 DIAGNOSIS — E119 Type 2 diabetes mellitus without complications: Secondary | ICD-10-CM | POA: Diagnosis not present

## 2016-09-05 LAB — HM DIABETES EYE EXAM

## 2016-09-16 ENCOUNTER — Other Ambulatory Visit (INDEPENDENT_AMBULATORY_CARE_PROVIDER_SITE_OTHER): Payer: Self-pay | Admitting: Vascular Surgery

## 2016-09-16 DIAGNOSIS — I739 Peripheral vascular disease, unspecified: Secondary | ICD-10-CM

## 2016-09-16 DIAGNOSIS — Z95828 Presence of other vascular implants and grafts: Secondary | ICD-10-CM

## 2016-09-17 ENCOUNTER — Ambulatory Visit (INDEPENDENT_AMBULATORY_CARE_PROVIDER_SITE_OTHER): Payer: Medicare HMO

## 2016-09-17 ENCOUNTER — Encounter (INDEPENDENT_AMBULATORY_CARE_PROVIDER_SITE_OTHER): Payer: Self-pay

## 2016-09-17 ENCOUNTER — Encounter (INDEPENDENT_AMBULATORY_CARE_PROVIDER_SITE_OTHER): Payer: Medicare HMO

## 2016-09-17 ENCOUNTER — Encounter (INDEPENDENT_AMBULATORY_CARE_PROVIDER_SITE_OTHER): Payer: Self-pay | Admitting: Vascular Surgery

## 2016-09-17 ENCOUNTER — Ambulatory Visit (INDEPENDENT_AMBULATORY_CARE_PROVIDER_SITE_OTHER): Payer: Medicare HMO | Admitting: Vascular Surgery

## 2016-09-17 VITALS — BP 161/74 | HR 53 | Resp 16 | Wt 184.0 lb

## 2016-09-17 DIAGNOSIS — E119 Type 2 diabetes mellitus without complications: Secondary | ICD-10-CM

## 2016-09-17 DIAGNOSIS — I739 Peripheral vascular disease, unspecified: Secondary | ICD-10-CM

## 2016-09-17 DIAGNOSIS — Z95828 Presence of other vascular implants and grafts: Secondary | ICD-10-CM

## 2016-09-17 DIAGNOSIS — E785 Hyperlipidemia, unspecified: Secondary | ICD-10-CM

## 2016-09-17 DIAGNOSIS — I700 Atherosclerosis of aorta: Secondary | ICD-10-CM | POA: Diagnosis not present

## 2016-09-17 NOTE — Progress Notes (Signed)
Subjective:    Patient ID: William Ball, male    DOB: [birth year], 71 y.o.   MRN: [id number] Chief Complaint  Patient presents with  . Follow-up   Patient presents for a six month PAD follow up. The patient underwent an ABI which showed no significant lower extremity PAD, toe-brachial indicis are normal (stable when compared to last exam ). An aorto-iliac arterial duplex is notable for patent left stent with biphasic flow. The patient denies any claudication like symptoms, rest pain or ulcers to his feet / toes.    Review of Systems  Constitutional: Negative.   HENT: Negative.   Eyes: Negative.   Respiratory: Negative.   Cardiovascular: Negative.   Gastrointestinal: Negative.   Endocrine: Negative.   Genitourinary: Negative.   Musculoskeletal: Negative.   Skin: Negative.   Allergic/Immunologic: Negative.   Neurological: Negative.   Hematological: Negative.   Psychiatric/Behavioral: Negative.       Objective:   Physical Exam  Constitutional: He is oriented to person, place, and time. He appears well-developed and well-nourished. No distress.  HENT:  Head: Normocephalic and atraumatic.  Eyes: Conjunctivae are normal. Pupils are equal, round, and reactive to light.  Neck: Normal range of motion.  Cardiovascular: Normal rate, regular rhythm, normal heart sounds and intact distal pulses.   Pulses:      Radial pulses are 2+ on the right side, and 2+ on the left side.       Dorsalis pedis pulses are 2+ on the right side, and 2+ on the left side.       Posterior tibial pulses are 2+ on the right side, and 2+ on the left side.  Pulmonary/Chest: Effort normal.  Musculoskeletal: Normal range of motion. He exhibits no edema.  Neurological: He is alert and oriented to person, place, and time.  Skin: Skin is warm and dry. He is not diaphoretic.  Psychiatric: He has a normal mood and affect. His behavior is normal. Judgment and thought content normal.  Vitals reviewed.  BP (!) 161/74    Pulse (!) 53   Resp 16   Wt 184 lb (83.5 kg)   BMI 25.66 kg/m   Past Medical History:  Diagnosis Date  . Diabetes mellitus without complication (HCC)    borderline  . Hepatomegaly   . Hyperlipidemia   . Hypertension   . Substance abuse    Social History   Social History  . Marital status: Married    Spouse name: N/A  . Number of children: N/A  . Years of education: N/A   Occupational History  . Not on file.   Social History Main Topics  . Smoking status: Current Every Day Smoker    Packs/day: 1.50    Years: 55.00    Types: Cigarettes  . Smokeless tobacco: Never Used  . Alcohol use Yes     Comment: 4+ daily beer  . Drug use: No  . Sexual activity: Not on file   Other Topics Concern  . Not on file   Social History Narrative  . No narrative on file   Past Surgical History:  Procedure Laterality Date  . APPENDECTOMY    . BONE MARROW TRANSPLANT Left    ankle.  patient unsure of this but thinks this is what happened  . CATARACT EXTRACTION W/PHACO Left 11/13/2015   Procedure: CATARACT EXTRACTION PHACO AND INTRAOCULAR LENS PLACEMENT (IOC);  Surgeon: [name], MD;  Location: [facility] OR[facility];  Service: Ophthalmology;  Laterality: Left;  [place] 1.06 AP% 23.9  CDE 15.81 Fluid pack lot # [id number]  . CATARACT EXTRACTION W/PHACO Right 11/27/2015   Procedure: CATARACT EXTRACTION PHACO AND INTRAOCULAR LENS PLACEMENT (IOC);  Surgeon: [name], MD;  Location: [facility];  Service: Ophthalmology;  Laterality: Right;  [place] 1.06 AP% 23.8 CDE 15.80 Fluid pack lot # [id number]  . COLONOSCOPY  2012   polyps, diverticulosis , 24yr repeat  . COLONOSCOPY WITH PROPOFOL N/A 07/17/2016   Procedure: COLONOSCOPY WITH PROPOFOL;  Surgeon: [name], MD;  Location: [facility] END[facility];  Service: Endoscopy;  Laterality: N/A;  . FRACTURE SURGERY Left 1982   foot, leg.unsure if metal in these parts.possibly replaced ankle  . PERIPHERAL VASCULAR CATHETERIZATION Left 12/27/2015   Procedure: Lower  Extremity Angiography;  Surgeon: Algernon Huxley, MD;  Location: [facility] [place] [facility];  Service: Cardiovascular;  Laterality: Left;   Family History  Problem Relation Age of Onset  . Cancer Mother   . Heart disease Mother   . Hyperlipidemia Mother   . Heart disease Father   . Hyperlipidemia Father    No Known Allergies     Assessment & Plan:  Patient presents for a six month PAD follow up. The patient underwent an ABI which showed no significant lower extremity PAD, toe-brachial indicis are normal (stable when compared to last exam ). An aorto-iliac arterial duplex is notable for patent left stent with biphasic flow. The patient denies any claudication like symptoms, rest pain or ulcers to his feet / toes.   1. Aortic atherosclerosis (HCC) - Stable Duplex and ABI stable Patient without complaint. No intervention indicated at this time. I have discussed with the patient at length the risk factors for and pathogenesis of atherosclerotic disease and encouraged a healthy diet, regular exercise regimen and blood pressure / glucose control.  The patient was encouraged to call the office in the interim if he experiences any claudication like symptoms, rest pain or ulcers to his feet / toes.  - VAS US AORTA/IVC/ILIACS; Future - VAS [place] ABI WITH/WO TBI; Future  2. Hyperlipidemia, unspecified hyperlipidemia type - Stable Encouraged good control as its slows the progression of atherosclerotic disease  3. Diabetes mellitus without complication ([place]) - Stable Encouraged good control as its slows the progression of atherosclerotic disease  Current Outpatient Prescriptions on File Prior to Visit  Medication Sig Dispense Refill  . amLODipine (NORVASC) 10 MG tablet Take 1 tablet (10 mg total) by mouth daily. 90 tablet 4  . atorvastatin (LIPITOR) 20 MG tablet Take 1 tablet (20 mg total) by mouth daily. 90 tablet 4  . chlorthalidone (HYGROTON) 25 MG tablet Take 1 tablet (25 mg total) by mouth daily. 90  tablet 4  . clopidogrel (PLAVIX) 75 MG tablet Take 1 tablet (75 mg total) by mouth daily. 30 tablet 11  . lisinopril (PRINIVIL,ZESTRIL) 40 MG tablet Take 1 tablet (40 mg total) by mouth daily. 90 tablet 4  . meloxicam (MOBIC) 15 MG tablet Take 1 tablet (15 mg total) by mouth daily as needed for pain. 90 tablet 4  . metFORMIN (GLUCOPHAGE-XR) 500 MG 24 hr tablet Take 0.5 tablets (250 mg total) by mouth daily. 45 tablet 4  . metoprolol succinate (TOPROL-XL) 50 MG 24 hr tablet Take 1 tablet (50 mg total) by mouth daily. 90 tablet 4  . Omega-3 Fatty Acids (FISH OIL) 1000 MG CAPS Take by mouth.     No current facility-administered medications on file prior to visit.     There are no Patient Instructions on file for this visit.  No Follow-up on file.   Asma Boldon A Jhase Creppel, PA-C

## 2016-10-30 ENCOUNTER — Encounter: Payer: Self-pay | Admitting: Family Medicine

## 2016-10-30 ENCOUNTER — Ambulatory Visit (INDEPENDENT_AMBULATORY_CARE_PROVIDER_SITE_OTHER): Payer: Medicare HMO | Admitting: Family Medicine

## 2016-10-30 VITALS — BP 136/67 | HR 60 | Ht 69.0 in | Wt 181.6 lb

## 2016-10-30 DIAGNOSIS — E119 Type 2 diabetes mellitus without complications: Secondary | ICD-10-CM | POA: Diagnosis not present

## 2016-10-30 DIAGNOSIS — I10 Essential (primary) hypertension: Secondary | ICD-10-CM

## 2016-10-30 DIAGNOSIS — I739 Peripheral vascular disease, unspecified: Secondary | ICD-10-CM | POA: Diagnosis not present

## 2016-10-30 DIAGNOSIS — E785 Hyperlipidemia, unspecified: Secondary | ICD-10-CM | POA: Diagnosis not present

## 2016-10-30 LAB — BAYER DCA HB A1C WAIVED: HB A1C (BAYER DCA - WAIVED): 7 % — ABNORMAL HIGH

## 2016-10-30 NOTE — Assessment & Plan Note (Signed)
The current medical regimen is effective;  continue present plan and medications.  

## 2016-10-30 NOTE — Progress Notes (Signed)
   BP 136/67   Pulse 60   Ht 5\' 9"  (1.753 m)   Wt 181 lb 9.6 oz (82.4 kg)   SpO2 97%   BMI 26.82 kg/m    Subjective:    Patient ID: William Ball, male    DOB: 25-Jan-1946, 71 y.o.   MRN: [id number]  HPI: William Ball is a 71 y.o. male  Diabetes follow-up Patient doing well with no complaints from diabetes noted low blood sugar spells. Good report from vascular about leg swelling with no abnormalities good circulation to his legs. Patient's going back to work next week will be doing a lot of walking discussed support hose. Other medications doing well with no complaints taking faithfully without problems.  Relevant past medical, surgical, family and social history reviewed and updated as indicated. Interim medical history since our last visit reviewed. Allergies and medications reviewed and updated.  Review of Systems  Constitutional: Negative.   Respiratory: Negative.   Cardiovascular: Negative.     Per HPI unless specifically indicated above     Objective:    BP 136/67   Pulse 60   Ht 5\' 9"  (1.753 m)   Wt 181 lb 9.6 oz (82.4 kg)   SpO2 97%   BMI 26.82 kg/m   Wt Readings from Last 3 Encounters:  10/30/16 181 lb 9.6 oz (82.4 kg)  09/17/16 184 lb (83.5 kg)  07/28/16 185 lb 9.6 oz (84.2 kg)    Physical Exam  Constitutional: He is oriented to person, place, and time. He appears well-developed and well-nourished.  HENT:  Head: Normocephalic and atraumatic.  Eyes: Conjunctivae and EOM are normal.  Neck: Normal range of motion.  Cardiovascular: Normal rate, regular rhythm and normal heart sounds.   Pulmonary/Chest: Effort normal and breath sounds normal.  Musculoskeletal: Normal range of motion.  Neurological: He is alert and oriented to person, place, and time.  Skin: No erythema.  Psychiatric: He has a normal mood and affect. His behavior is normal. Judgment and thought content normal.    Results for orders placed or performed in visit on 09/10/16  HM DIABETES  EYE EXAM  Result Value Ref Range   HM Diabetic Eye Exam No Retinopathy No Retinopathy      Assessment & Plan:   Problem List Items Addressed This Visit      Cardiovascular and Mediastinum   Hypertension    The current medical regimen is effective;  continue present plan and medications.         Endocrine   Diabetes mellitus without complication ([place]) - Primary    The current medical regimen is effective;  continue present plan and medications.       Relevant Orders   Bayer DCA Hb A1c Waived     Other   Hyperlipidemia    The current medical regimen is effective;  continue present plan and medications.       Relevant Orders   Bayer DCA Hb A1c Waived   Claudication [facility])    The current medical regimen is effective;  continue present plan and medications.        Other Visit Diagnoses    Essential hypertension, benign       Relevant Orders   Bayer DCA Hb A1c Waived       Follow up plan: Return in about 3 months (around 01/30/2017) for Hemoglobin A1c, BMP,  Lipids, ALT, AST.

## 2016-11-12 ENCOUNTER — Other Ambulatory Visit (INDEPENDENT_AMBULATORY_CARE_PROVIDER_SITE_OTHER): Payer: Self-pay | Admitting: Vascular Surgery

## 2016-11-12 ENCOUNTER — Encounter (INDEPENDENT_AMBULATORY_CARE_PROVIDER_SITE_OTHER): Payer: Self-pay

## 2016-11-20 DIAGNOSIS — Z0000 Encounter for general adult medical examination without abnormal findings: Secondary | ICD-10-CM | POA: Diagnosis not present

## 2016-11-20 DIAGNOSIS — M130 Polyarthritis, unspecified: Secondary | ICD-10-CM | POA: Diagnosis not present

## 2016-11-20 DIAGNOSIS — Z972 Presence of dental prosthetic device (complete) (partial): Secondary | ICD-10-CM | POA: Diagnosis not present

## 2016-11-20 DIAGNOSIS — I70299 Other atherosclerosis of native arteries of extremities, unspecified extremity: Secondary | ICD-10-CM | POA: Diagnosis not present

## 2016-11-20 DIAGNOSIS — I10 Essential (primary) hypertension: Secondary | ICD-10-CM | POA: Diagnosis not present

## 2016-11-20 DIAGNOSIS — R69 Illness, unspecified: Secondary | ICD-10-CM | POA: Diagnosis not present

## 2016-11-20 DIAGNOSIS — E7800 Pure hypercholesterolemia, unspecified: Secondary | ICD-10-CM | POA: Diagnosis not present

## 2016-11-20 DIAGNOSIS — R7303 Prediabetes: Secondary | ICD-10-CM | POA: Diagnosis not present

## 2016-11-20 DIAGNOSIS — Z6827 Body mass index (BMI) 27.0-27.9, adult: Secondary | ICD-10-CM | POA: Diagnosis not present

## 2017-01-27 ENCOUNTER — Encounter: Payer: Self-pay | Admitting: Emergency Medicine

## 2017-01-27 ENCOUNTER — Emergency Department: Payer: Medicare HMO

## 2017-01-27 ENCOUNTER — Emergency Department
Admission: EM | Admit: 2017-01-27 | Discharge: 2017-01-27 | Disposition: A | Discharge status: Home / Self Care | Payer: Medicare HMO | Attending: Emergency Medicine | Admitting: Emergency Medicine

## 2017-01-27 ENCOUNTER — Ambulatory Visit (INDEPENDENT_AMBULATORY_CARE_PROVIDER_SITE_OTHER)
Admission: EM | Admit: 2017-01-27 | Discharge: 2017-01-27 | Disposition: A | Discharge status: Other Acute Inpatient Hospital | Payer: Medicare HMO | Source: Home / Self Care | Attending: Emergency Medicine | Admitting: Emergency Medicine

## 2017-01-27 DIAGNOSIS — Z7984 Long term (current) use of oral hypoglycemic drugs: Secondary | ICD-10-CM

## 2017-01-27 DIAGNOSIS — Z8249 Family history of ischemic heart disease and other diseases of the circulatory system: Secondary | ICD-10-CM | POA: Insufficient documentation

## 2017-01-27 DIAGNOSIS — Z9842 Cataract extraction status, left eye: Secondary | ICD-10-CM | POA: Insufficient documentation

## 2017-01-27 DIAGNOSIS — I10 Essential (primary) hypertension: Secondary | ICD-10-CM | POA: Insufficient documentation

## 2017-01-27 DIAGNOSIS — R0781 Pleurodynia: Secondary | ICD-10-CM

## 2017-01-27 DIAGNOSIS — Z9841 Cataract extraction status, right eye: Secondary | ICD-10-CM | POA: Insufficient documentation

## 2017-01-27 DIAGNOSIS — Z79899 Other long term (current) drug therapy: Secondary | ICD-10-CM | POA: Insufficient documentation

## 2017-01-27 DIAGNOSIS — R1011 Right upper quadrant pain: Secondary | ICD-10-CM | POA: Diagnosis not present

## 2017-01-27 DIAGNOSIS — F17210 Nicotine dependence, cigarettes, uncomplicated: Secondary | ICD-10-CM | POA: Diagnosis not present

## 2017-01-27 DIAGNOSIS — K852 Alcohol induced acute pancreatitis without necrosis or infection: Secondary | ICD-10-CM | POA: Insufficient documentation

## 2017-01-27 DIAGNOSIS — R7303 Prediabetes: Secondary | ICD-10-CM

## 2017-01-27 DIAGNOSIS — Z961 Presence of intraocular lens: Secondary | ICD-10-CM

## 2017-01-27 DIAGNOSIS — K219 Gastro-esophageal reflux disease without esophagitis: Secondary | ICD-10-CM

## 2017-01-27 DIAGNOSIS — R109 Unspecified abdominal pain: Secondary | ICD-10-CM

## 2017-01-27 DIAGNOSIS — Z7902 Long term (current) use of antithrombotics/antiplatelets: Secondary | ICD-10-CM

## 2017-01-27 DIAGNOSIS — R69 Illness, unspecified: Secondary | ICD-10-CM | POA: Diagnosis not present

## 2017-01-27 DIAGNOSIS — R1010 Upper abdominal pain, unspecified: Secondary | ICD-10-CM

## 2017-01-27 DIAGNOSIS — E119 Type 2 diabetes mellitus without complications: Secondary | ICD-10-CM | POA: Insufficient documentation

## 2017-01-27 DIAGNOSIS — R001 Bradycardia, unspecified: Secondary | ICD-10-CM | POA: Insufficient documentation

## 2017-01-27 DIAGNOSIS — Z9862 Peripheral vascular angioplasty status: Secondary | ICD-10-CM

## 2017-01-27 DIAGNOSIS — I700 Atherosclerosis of aorta: Secondary | ICD-10-CM

## 2017-01-27 DIAGNOSIS — K760 Fatty (change of) liver, not elsewhere classified: Secondary | ICD-10-CM | POA: Diagnosis not present

## 2017-01-27 DIAGNOSIS — E785 Hyperlipidemia, unspecified: Secondary | ICD-10-CM

## 2017-01-27 DIAGNOSIS — R079 Chest pain, unspecified: Secondary | ICD-10-CM

## 2017-01-27 DIAGNOSIS — J439 Emphysema, unspecified: Secondary | ICD-10-CM | POA: Insufficient documentation

## 2017-01-27 LAB — URINALYSIS, COMPLETE (UACMP) WITH MICROSCOPIC
Bacteria, UA: NONE SEEN
Bilirubin Urine: NEGATIVE
Glucose, UA: NEGATIVE mg/dL
Hgb urine dipstick: NEGATIVE
Ketones, ur: NEGATIVE mg/dL
Leukocytes, UA: NEGATIVE
Nitrite: NEGATIVE
Protein, ur: NEGATIVE mg/dL
Specific Gravity, Urine: 1.009 (ref 1.005–1.030)
Squamous Epithelial / HPF: NONE SEEN
pH: 5 (ref 5.0–8.0)

## 2017-01-27 LAB — CBC
HCT: 41 % (ref 40.0–52.0)
Hemoglobin: 14 g/dL (ref 13.0–18.0)
MCH: 30.8 pg (ref 26.0–34.0)
MCHC: 34 g/dL (ref 32.0–36.0)
MCV: 90.7 fL (ref 80.0–100.0)
Platelets: 245 K/uL (ref 150–440)
RBC: 4.53 MIL/uL (ref 4.40–5.90)
RDW: 13.1 % (ref 11.5–14.5)
WBC: 7.3 K/uL (ref 3.8–10.6)

## 2017-01-27 LAB — LIPASE, BLOOD: Lipase: 130 U/L — ABNORMAL HIGH (ref 11–51)

## 2017-01-27 LAB — COMPREHENSIVE METABOLIC PANEL WITH GFR
ALT: 28 U/L (ref 17–63)
AST: 31 U/L (ref 15–41)
Albumin: 4.1 g/dL (ref 3.5–5.0)
Alkaline Phosphatase: 84 U/L (ref 38–126)
Anion gap: 10 (ref 5–15)
BUN: 36 mg/dL — ABNORMAL HIGH (ref 6–20)
CO2: 20 mmol/L — ABNORMAL LOW (ref 22–32)
Calcium: 9.7 mg/dL (ref 8.9–10.3)
Chloride: 105 mmol/L (ref 101–111)
Creatinine, Ser: 1.24 mg/dL (ref 0.61–1.24)
GFR calc Af Amer: >60 mL/min
GFR calc non Af Amer: 56 mL/min — ABNORMAL LOW
Glucose, Bld: 126 mg/dL — ABNORMAL HIGH (ref 65–99)
Potassium: 4.5 mmol/L (ref 3.5–5.1)
Sodium: 135 mmol/L (ref 135–145)
Total Bilirubin: 0.4 mg/dL (ref 0.3–1.2)
Total Protein: 7.1 g/dL (ref 6.5–8.1)

## 2017-01-27 LAB — TROPONIN I: Troponin I: <0.03 ng/mL

## 2017-01-27 MED ORDER — IOPAMIDOL (ISOVUE-300) INJECTION 61%
30.0000 mL | Freq: Once | INTRAVENOUS | Status: AC | PRN
  Administered 2017-01-27: 30 mL via ORAL

## 2017-01-27 MED ORDER — ONDANSETRON 4 MG PO TBDP: 4.0000 mg | ORAL_TABLET | Freq: Four times a day (QID) | ORAL | 0 refills | Status: DC | PRN

## 2017-01-27 MED ORDER — IOPAMIDOL (ISOVUE-300) INJECTION 61%
100.0000 mL | Freq: Once | INTRAVENOUS | Status: AC | PRN
  Administered 2017-01-27: 100 mL via INTRAVENOUS

## 2017-01-27 NOTE — ED Notes (Signed)
Patient transported to Ultrasound 

## 2017-01-27 NOTE — ED Notes (Signed)
Patient returned from radiology

## 2017-01-27 NOTE — ED Provider Notes (Signed)
HPI  SUBJECTIVE:  William Ball is a 71 y.o. male who presents with 5 days of midline upper abdominal/lower rib pain that was intermittent and became constant yesterday. Patient states it is becoming more intense. He has trouble describing what the pain feels like but says that it is like a pressure, heaviness. It is worse with bending forward, and temporarily better with belching. He tried Gas-X, coke. No radiation of his neck, through to his back or down his arm. No nausea, vomiting, diaphoresis. He is denies a change in his baseline cough. No wheezing, shortness of breath. No other abdominal pain, back pain. It does not seem to be associated with exertion, lying down, eating. He denies abdominal distention, belching. States he is water brash but this is not new. He denies trauma to his chest. No symptoms like this before. No change in his meds recently. Past medical history of diabetes, hypertension, smoking, emphysema, hypercholesterolemia, GERD, aortic atherosclerosis,, status post angioplasty on his left leg and is on Plavix. No history of cancer, bradycardia, known coronary artery disease. Family history significant for fatal MI in his father at age 23 and fatal MI in his mother at age 7. PMD: Dr. Janae Bridgeman.    Past Medical History:  Diagnosis Date  . Diabetes mellitus without complication (HCC)    borderline  . Hepatomegaly   . Hyperlipidemia   . Hypertension   . Substance abuse     Past Surgical History:  Procedure Laterality Date  . ANGIOPLASTY    . APPENDECTOMY    . BONE MARROW TRANSPLANT Left    ankle.  patient unsure of this but thinks this is what happened  . CARDIAC SURGERY    . CATARACT EXTRACTION W/PHACO Left 11/13/2015   Procedure: CATARACT EXTRACTION PHACO AND INTRAOCULAR LENS PLACEMENT (IOC);  Surgeon: [name], MD;  Location: [facility];  Service: Ophthalmology;  Laterality: Left;  [place] 1.06 AP% 23.9 CDE 15.81 Fluid pack lot # [id number]  . CATARACT EXTRACTION  W/PHACO Right 11/27/2015   Procedure: CATARACT EXTRACTION PHACO AND INTRAOCULAR LENS PLACEMENT (IOC);  Surgeon: [name], MD;  Location: [facility];  Service: Ophthalmology;  Laterality: Right;  [place] 1.06 AP% 23.8 CDE 15.80 Fluid pack lot # [id number]  . COLONOSCOPY  2012   polyps, diverticulosis , 47yr repeat  . COLONOSCOPY WITH PROPOFOL N/A 07/17/2016   Procedure: COLONOSCOPY WITH PROPOFOL;  Surgeon: [name], MD;  Location: [facility];  Service: Endoscopy;  Laterality: N/A;  . FRACTURE SURGERY Left 1982   foot, leg.unsure if metal in these parts.possibly replaced ankle  . PERIPHERAL VASCULAR CATHETERIZATION Left 12/27/2015   Procedure: Lower Extremity Angiography;  Surgeon: Algernon Huxley, MD;  Location: [place] [facility];  Service: Cardiovascular;  Laterality: Left;    Family History  Problem Relation Age of Onset  . Cancer Mother   . Heart disease Mother   . Hyperlipidemia Mother   . Heart disease Father   . Hyperlipidemia Father     Social History  Substance Use Topics  . Smoking status: Current Every Day Smoker    Packs/day: 1.50    Years: 55.00    Types: Cigarettes  . Smokeless tobacco: Never Used  . Alcohol use Yes     Comment: 4+ daily beer    No current facility-administered medications for this encounter.   Current Outpatient Prescriptions:  .  amLODipine (NORVASC) 10 MG tablet, Take 1 tablet (10 mg total) by mouth daily., Disp: 90 tablet, Rfl: 4 .  atorvastatin (LIPITOR)  20 MG tablet, Take 1 tablet (20 mg total) by mouth daily., Disp: 90 tablet, Rfl: 4 .  chlorthalidone (HYGROTON) 25 MG tablet, Take 1 tablet (25 mg total) by mouth daily., Disp: 90 tablet, Rfl: 4 .  clopidogrel (PLAVIX) 75 MG tablet, TAKE 1 TABLET BY MOUTH EVERY DAY, Disp: 30 tablet, Rfl: 6 .  lisinopril (PRINIVIL,ZESTRIL) 40 MG tablet, Take 1 tablet (40 mg total) by mouth daily., Disp: 90 tablet, Rfl: 4 .  meloxicam (MOBIC) 15 MG tablet, Take 1 tablet (15 mg total) by mouth daily as needed  for pain., Disp: 90 tablet, Rfl: 4 .  metFORMIN (GLUCOPHAGE-XR) 500 MG 24 hr tablet, Take 0.5 tablets (250 mg total) by mouth daily., Disp: 45 tablet, Rfl: 4 .  metoprolol succinate (TOPROL-XL) 50 MG 24 hr tablet, Take 1 tablet (50 mg total) by mouth daily., Disp: 90 tablet, Rfl: 4  No Known Allergies   ROS  As noted in HPI.   Physical Exam  BP (!) 164/61 (BP Location: Left Arm)   Pulse (!) 52   Temp 97.9 F (36.6 C) (Oral)   Resp 16   Ht 6' (1.829 m)   Wt 186 lb (84.4 kg)   SpO2 98%   BMI 25.23 kg/m   Wt Readings from Last 3 Encounters:  01/27/17 186 lb (84.4 kg)  10/30/16 181 lb 9.6 oz (82.4 kg)  09/17/16 184 lb (83.5 kg)   Temp Readings from Last 3 Encounters:  01/27/17 97.9 F (36.6 C) (Oral)  07/17/16 97.6 F (36.4 C) (Tympanic)  03/10/16 98.7 F (37.1 C)   BP Readings from Last 3 Encounters:  01/27/17 (!) 164/61  10/30/16 136/67  09/17/16 (!) 161/74   Pulse Readings from Last 3 Encounters:  01/27/17 (!) 52  10/30/16 60  09/17/16 (!) 53    Constitutional: Well developed, well nourished, no acute distress Eyes: PERRL, EOMI, conjunctiva normal bilaterally HENT: Normocephalic, atraumatic,mucus membranes moist Respiratory: Clear to auscultation bilaterally, no rales, no wheezing, no rhonchi Cardiovascular: Normal appearance, regular bradycardia  no murmurs, no gallops, no rubs GI: Normal appearance , mild subxiphoid/epigastric tenderness. Negative Murphy. Soft, nondistended, normal bowel sounds, otherwise nontender, no rebound, no guarding positive ventral hernia. Back: no CVAT skin: No rash, skin intact Musculoskeletal: No edema, no tenderness, no deformities RP , PT 2+ equal bilaterally Neurologic: Alert & oriented x 3, CN II-XII grossly intact, no motor deficits, sensation grossly intact Psychiatric: Speech and behavior appropriate   ED Course   Medications - No data to display  Orders Placed This Encounter  Procedures  . EKG 12-Lead     Standing Status:   Standing    Number of Occurrences:   1  . ED EKG    Standing Status:   Standing    Number of Occurrences:   1    Order Specific Question:   Reason for Exam    Answer:   Chest Pain   No results found for this or any previous visit (from the past 24 hour(s)). No results found.  ED Clinical Impression  Chest pain, unspecified type  Pain of upper abdomen  ED Assessment/Plan  Bradycardia is not new.  EKG: Rate 53. Normal axis, first-degree AV block, no hypertrophy. No ischemic ST-T wave changes. Unable to obtain previous EKG from 2008.  While patient does have some upper abdominal tenderness which could be gastritis, patient has multiple cardiac risk factors, so I believe that the patient would benefit from a comprehensive workup, serial EKGs and observation. Transferring to  the [place] ED via EMS. He is already on Plavix so aspirin was not given. Discussed medical decision-making, rationale for transfer with patient. He agrees with plan.   notified Materials engineer at [facility].     No orders of the defined types were placed in this encounter.   *This clinic note was created using Dragon dictation software. Therefore, there may be occasional mistakes despite careful proofreading.  ?   Melynda Ripple, MD 01/27/17 [id number]

## 2017-01-27 NOTE — Discharge Instructions (Signed)
You were seen in the emergency room for abdominal pain. It is important that you follow up closely with your primary care doctor in the next couple of days. ° ° °Please return to the emergency room right away if you are to develop a fever, severe nausea, your pain becomes severe or worsens, you are unable to keep food down, begin vomiting any dark or bloody fluid, you develop any dark or bloody stools, feel dehydrated, or other new concerns or symptoms arise. ° ° °

## 2017-01-27 NOTE — ED Triage Notes (Signed)
Pt in via [facility] from [facility] with complaints of RUQ abdominal pain x approximately 4-5 days.  Pt reports taking gas-x with some releif but with worsening pain today.  Pt denies any N/V/D.  NAD noted at this time.

## 2017-01-27 NOTE — ED Provider Notes (Signed)
[facility] Emergency Department Provider Note   ____________________________________________   First MD Initiated Contact with Patient 01/27/17 1022     (approximate)  I have reviewed the triage vital signs and the nursing notes.   HISTORY  Chief Complaint Abdominal Pain    HPI William Ball is a 71 y.o. male here for evaluation of pain in his upper abdomen.  Patient reports that for about 4-5 days been experiencing fairly steady discomfort in the right upper abdomen. It sometimes feels like it's just underneath the ribs on the right. No shortness of breath. Denies any chest pain. No nausea or vomiting. Continues to move his bowels normally. This morning he reports the pain seeming to steadily intensify, but then after taking Gas-X and belching his pain has now been completely relieved.  Patient reports he is no longer having any discomfort. Not associated with any exertion. Denies that the pain never even entered his chest. It is not radiating. No moving pain. All pain is presently resolved.   Past Medical History:  Diagnosis Date  . Diabetes mellitus without complication (HCC)    borderline  . Hepatomegaly   . Hyperlipidemia   . Hypertension   . Substance abuse     Patient Active Problem List   Diagnosis Date Noted  . Aortic atherosclerosis ([place]) 07/28/2016  . First degree hemorrhoids   . Personal history of tobacco use, presenting hazards to health 06/25/2016  . Claudication ([place]) 12/05/2015  . Smoking greater than 40 pack years 12/05/2015  . Senile purpura ([place]) 12/05/2015  . Hip arthritis 06/07/2015  . BPH (benign prostatic hyperplasia) 06/07/2015  . Gastroesophageal reflux 01/22/2015  . Hyperlipidemia   . Diabetes mellitus without complication ([place])   . Hypertension     Past Surgical History:  Procedure Laterality Date  . ANGIOPLASTY    . APPENDECTOMY    . BONE MARROW TRANSPLANT Left    ankle.  patient unsure of this but  thinks this is what happened  . CARDIAC SURGERY    . CATARACT EXTRACTION W/PHACO Left 11/13/2015   Procedure: CATARACT EXTRACTION PHACO AND INTRAOCULAR LENS PLACEMENT (IOC);  Surgeon: [name], MD;  Location: [facility];  Service: Ophthalmology;  Laterality: Left;  [place] 1.06 AP% 23.9 CDE 15.81 Fluid pack lot # [id number]  . CATARACT EXTRACTION W/PHACO Right 11/27/2015   Procedure: CATARACT EXTRACTION PHACO AND INTRAOCULAR LENS PLACEMENT (IOC);  Surgeon: [name], MD;  Location: [facility];  Service: Ophthalmology;  Laterality: Right;  [place] 1.06 AP% 23.8 CDE 15.80 Fluid pack lot # [id number]  . COLONOSCOPY  2012   polyps, diverticulosis , 68yr repeat  . COLONOSCOPY WITH PROPOFOL N/A 07/17/2016   Procedure: COLONOSCOPY WITH PROPOFOL;  Surgeon: [name], MD;  Location: [facility];  Service: Endoscopy;  Laterality: N/A;  . FRACTURE SURGERY Left 1982   foot, leg.unsure if metal in these parts.possibly replaced ankle  . PERIPHERAL VASCULAR CATHETERIZATION Left 12/27/2015   Procedure: Lower Extremity Angiography;  Surgeon: Algernon Huxley, MD;  Location: [place] [facility];  Service: Cardiovascular;  Laterality: Left;    Prior to Admission medications   Medication Sig Start Date End Date Taking? Authorizing Provider  amLODipine (NORVASC) 10 MG tablet Take 1 tablet (10 mg total) by mouth daily. 06/12/16   Guadalupe Maple, MD  atorvastatin (LIPITOR) 20 MG tablet Take 1 tablet (20 mg total) by mouth daily. 06/12/16   Guadalupe Maple, MD  chlorthalidone (HYGROTON) 25 MG tablet Take 1 tablet (25 mg  total) by mouth daily. 06/12/16   Guadalupe Maple, MD  clopidogrel (PLAVIX) 75 MG tablet TAKE 1 TABLET BY MOUTH EVERY DAY 11/12/16   Algernon Huxley, MD  lisinopril (PRINIVIL,ZESTRIL) 40 MG tablet Take 1 tablet (40 mg total) by mouth daily. 06/12/16   Guadalupe Maple, MD  meloxicam (MOBIC) 15 MG tablet Take 1 tablet (15 mg total) by mouth daily as needed for pain. 06/12/16   Guadalupe Maple, MD  metFORMIN  (GLUCOPHAGE-XR) 500 MG 24 hr tablet Take 0.5 tablets (250 mg total) by mouth daily. 06/12/16   Guadalupe Maple, MD  metoprolol succinate (TOPROL-XL) 50 MG 24 hr tablet Take 1 tablet (50 mg total) by mouth daily. 07/28/16   Guadalupe Maple, MD  ondansetron (ZOFRAN ODT) 4 MG disintegrating tablet Take 1 tablet (4 mg total) by mouth every 6 (six) hours as needed for nausea or vomiting. 01/27/17   Delman Kitten, MD    Allergies Patient has no known allergies.  Family History  Problem Relation Age of Onset  . Cancer Mother   . Heart disease Mother   . Hyperlipidemia Mother   . Heart disease Father   . Hyperlipidemia Father     Social History Social History  Substance Use Topics  . Smoking status: Current Every Day Smoker    Packs/day: 1.50    Years: 55.00    Types: Cigarettes  . Smokeless tobacco: Never Used  . Alcohol use Yes     Comment: 4+ daily beer    Review of Systems Constitutional: No fever/chills Eyes: No visual changes. ENT: No sore throat. Cardiovascular: Denies chest pain. Respiratory: Denies shortness of breath. Gastrointestinal:   No diarrhea.  No constipation.no pain in the lower abdomen. Reports his previously as appendix removed. No pain on the left. No pain in the back. Genitourinary: Negative for dysuria. Musculoskeletal: Negative for back pain. Skin: Negative for rash. Neurological: Negative for headaches, focal weakness or numbness.    ____________________________________________   PHYSICAL EXAM:  VITAL SIGNS: ED Triage Vitals  Enc Vitals Group     BP 01/27/17 1018 (!) 159/92     Pulse Rate 01/27/17 1018 (!) 52     Resp 01/27/17 1018 16     Temp 01/27/17 1018 97.8 F (36.6 C)     Temp Source 01/27/17 1018 Oral     SpO2 01/27/17 1018 98 %     Weight 01/27/17 1018 186 lb (84.4 kg)     Height 01/27/17 1018 6' (1.829 m)     Head Circumference --      Peak Flow --      Pain Score 01/27/17 1025 0     Pain Loc --      Pain Edu? --      Excl. in  [place]? --     Constitutional: Alert and oriented. Well appearing and in no acute distress. Eyes: Conjunctivae are normal. Head: Atraumatic. Nose: No congestion/rhinnorhea. Mouth/Throat: Mucous membranes are moist. Neck: No stridor.   Cardiovascular: Normal rate, regular rhythm. Grossly normal heart sounds.  Good peripheral circulation. Respiratory: Normal respiratory effort.  No retractions. Lungs CTAB. Gastrointestinal: Soft and nontenderexcept for minimal discomfort in the right upper quadrant without rebound or guarding. No distention. Musculoskeletal: No lower extremity tenderness nor edema. Neurologic:  Normal speech and language. No gross focal neurologic deficits are appreciated.  Skin:  Skin is warm, dry and intact. No rash noted. Psychiatric: Mood and affect are normal. Speech and behavior are normal.  ____________________________________________  LABS (all labs ordered are listed, but only abnormal results are displayed)  Labs Reviewed  LIPASE, BLOOD - Abnormal; Notable for the following:       Result Value   Lipase 130 (*)    All other components within normal limits  COMPREHENSIVE METABOLIC PANEL - Abnormal; Notable for the following:    CO2 20 (*)    Glucose, Bld 126 (*)    BUN 36 (*)    GFR calc non Af Amer 56 (*)    All other components within normal limits  URINALYSIS, COMPLETE (UACMP) WITH MICROSCOPIC - Abnormal; Notable for the following:    Color, Urine YELLOW (*)    APPearance CLEAR (*)    All other components within normal limits  CBC  TROPONIN I   ____________________________________________  EKG  ED ECG REPORT I, Theresia Pree, the attending physician, personally viewed and interpreted this ECG.  Date: 01/27/2017 EKG Time: 1030 Rate: 50 Rhythm: normal sinus rhythm QRS Axis: normal Intervals: normal ST/T Wave abnormalities: normal Narrative Interpretation: no evidence of acute  ischemia  ____________________________________________  RADIOLOGY  Dg Chest 2 View  Result Date: 01/27/2017 CLINICAL DATA:  Chest pain. EXAM: CHEST  2 VIEW COMPARISON:  Radiographs of December 27, 2013. CT scan of June 25, 2016. FINDINGS: The heart size and mediastinal contours are within normal limits. Atherosclerosis of thoracic aorta is noted. No pneumothorax or pleural effusion is noted. Right lung is clear. Stable calcified granuloma is noted in left lung base. Interval development of nodular density seen in left costophrenic sulcus. The visualized skeletal structures are unremarkable. IMPRESSION: Aortic atherosclerosis. Interval development of nodular density seen in left costophrenic sulcus which may represent overlying nipple shadow. Repeat radiograph with nipple markers is recommended for confirmation and to rule out the possibility of pulmonary nodule or mass. Electronically Signed   By: Marijo Conception, M.D.   On: 01/27/2017 11:28   Ct Abdomen Pelvis W Contrast  Result Date: 01/27/2017 CLINICAL DATA:  Worsening right upper quadrant pain for 5 days. EXAM: CT ABDOMEN AND PELVIS WITH CONTRAST TECHNIQUE: Multidetector CT imaging of the abdomen and pelvis was performed using the standard protocol following bolus administration of intravenous contrast. CONTRAST:  165mL ISOVUE-300 IOPAMIDOL (ISOVUE-300) INJECTION 61% COMPARISON:  08/02/2006 FINDINGS: Lower Chest: No acute findings. Hepatobiliary: No hepatic masses identified. Mild hepatic steatosis shows decrease since previous study. Gallbladder is unremarkable. Pancreas:  No mass or inflammatory changes. Spleen: Within normal limits in size and appearance. Adrenals/Urinary Tract: No masses identified. Tiny right renal cyst again noted. No evidence of hydronephrosis. Stomach/Bowel: No evidence of obstruction, inflammatory process or abnormal fluid collections. Vascular/Lymphatic: No pathologically enlarged lymph nodes. No abdominal aortic  aneurysm. Aortic atherosclerosis. Reproductive:  No mass or other significant abnormality. Other:  None. Musculoskeletal:  No suspicious bone lesions identified. IMPRESSION: No acute findings. Mild hepatic steatosis. Aortic atherosclerosis. Electronically Signed   By: Earle Gell M.D.   On: 01/27/2017 12:36   US Abdomen Limited Ruq  Result Date: 01/27/2017 CLINICAL DATA:  Abdominal pain for several days EXAM: ULTRASOUND ABDOMEN LIMITED RIGHT UPPER QUADRANT COMPARISON:  10/30/2010 FINDINGS: Gallbladder: No gallstones or wall thickening visualized. No sonographic Murphy sign noted by sonographer. Common bile duct: Diameter: 3 mm Liver: Mild fatty infiltration of the liver is noted. No focal mass lesion is seen. Some focal fatty sparing is noted adjacent to the gallbladder fossa. Portal vein is patent on color Doppler imaging with normal direction of blood flow towards the liver. IMPRESSION: Fatty liver. No  acute abnormality noted. Electronically Signed   By: Inez Catalina M.D.   On: 01/27/2017 11:15     CT reviewed, no acute. Right upper quadrant reviewed, no acute. ____________________________________________   PROCEDURES  Procedure(s) performed: None  Procedures  Critical Care performed: No  ____________________________________________   INITIAL IMPRESSION / ASSESSMENT AND PLAN / ED COURSE  Pertinent labs & imaging results that were available during my care of the patient were reviewed by me and considered in my medical decision making (see chart for details).  Differential diagnosis includes but is not limited to, abdominal perforation, aortic dissection, cholecystitis, appendicitis, diverticulitis, colitis, esophagitis/gastritis, kidney stone, pyelonephritis, urinary tract infection, aortic aneurysm. All are considered in decision and treatment plan. he denies any chest pain, his EKG and troponin are very reassuring. I doubt acute coronary syndrome based on the descriptor and clinical  examBased upon the patient's presentation and risk factors, proceed with right upper quadrant ultrasound to further evaluate for pain right upper abdomen. Denies any pulmonary symptoms.   ----------------------------------------- 1:23 PM on 01/27/2017 -----------------------------------------  CT negative for acute. Patient ambulatory, reports he feels well without pain or discomfort. Discussed the recommended that he reduce or discontinue his alcohol            alcohol use for which he states he drinks about 2-3 beers a day, and is not a history of withdrawals.     Also advised him very strict and careful return precautions  Return precautions and treatment recommendations and follow-up discussed with the patient who is agreeable with the plan.        ____________________________________________   FINAL CLINICAL IMPRESSION(S) / ED DIAGNOSES  Final diagnoses:  Abdominal pain  Alcohol-induced acute pancreatitis without infection or necrosis      NEW MEDICATIONS STARTED DURING THIS VISIT:  New Prescriptions   ONDANSETRON (ZOFRAN ODT) 4 MG DISINTEGRATING TABLET    Take 1 tablet (4 mg total) by mouth every 6 (six) hours as needed for nausea or vomiting.     Note:  This document was prepared using Dragon voice recognition software and may include unintentional dictation errors.     Delman Kitten, MD 01/27/17 1324

## 2017-01-27 NOTE — ED Notes (Signed)
EMS called to transport patient to [facility] ED 

## 2017-01-27 NOTE — ED Triage Notes (Signed)
Patient c/o chest pain that started Thursday.  Patient denies SOB.  Patient denies N/V.

## 2017-02-09 ENCOUNTER — Ambulatory Visit (INDEPENDENT_AMBULATORY_CARE_PROVIDER_SITE_OTHER): Payer: Medicare HMO | Admitting: Family Medicine

## 2017-02-09 ENCOUNTER — Encounter: Payer: Self-pay | Admitting: Family Medicine

## 2017-02-09 VITALS — BP 119/66 | HR 54 | Wt 179.0 lb

## 2017-02-09 DIAGNOSIS — E785 Hyperlipidemia, unspecified: Secondary | ICD-10-CM | POA: Diagnosis not present

## 2017-02-09 DIAGNOSIS — E119 Type 2 diabetes mellitus without complications: Secondary | ICD-10-CM

## 2017-02-09 DIAGNOSIS — R69 Illness, unspecified: Secondary | ICD-10-CM | POA: Diagnosis not present

## 2017-02-09 DIAGNOSIS — K852 Alcohol induced acute pancreatitis without necrosis or infection: Secondary | ICD-10-CM | POA: Insufficient documentation

## 2017-02-09 DIAGNOSIS — Z23 Encounter for immunization: Secondary | ICD-10-CM | POA: Diagnosis not present

## 2017-02-09 DIAGNOSIS — I700 Atherosclerosis of aorta: Secondary | ICD-10-CM | POA: Diagnosis not present

## 2017-02-09 DIAGNOSIS — I10 Essential (primary) hypertension: Secondary | ICD-10-CM

## 2017-02-09 NOTE — Assessment & Plan Note (Signed)
Still present.

## 2017-02-09 NOTE — Assessment & Plan Note (Signed)
The current medical regimen is effective;  continue present plan and medications.  

## 2017-02-09 NOTE — Assessment & Plan Note (Signed)
Patient's pancreatitis apparently resolved patient's largely cut back drinking and doing well. No further abdominal pain

## 2017-02-09 NOTE — Assessment & Plan Note (Signed)
The current medical regimen is effective;  continue present plan and medications.  

## 2017-02-09 NOTE — Progress Notes (Signed)
BP 119/66   Pulse (!) 54   Wt 179 lb (81.2 kg)   SpO2 98%   BMI 24.28 kg/m    Subjective:    Patient ID: William Ball, male    DOB: 1945-09-10, 71 y.o.   MRN: [id number]  HPI: William Ball is a 71 y.o. male  F/U pancreatitis Patient feeling better is largely quit drinking still has an occasional beer. Reviewed notes and x-rays and imaging studies showing stable atherosclerosis of aorta and otherwise stable. Patient's lipase was markedly elevated. Patient's stop metformin for a few days during pancreatitis spells and is back on it blood sugars been doing okay. This is presumed as patient hasn't been checking his blood sugars. Otherwise seems to be doing well. Feeling better and energy and strength back.  Relevant past medical, surgical, family and social history reviewed and updated as indicated. Interim medical history since our last visit reviewed. Allergies and medications reviewed and updated.  Review of Systems  Constitutional: Negative.   Respiratory: Negative.   Cardiovascular: Negative.     Per HPI unless specifically indicated above     Objective:    BP 119/66   Pulse (!) 54   Wt 179 lb (81.2 kg)   SpO2 98%   BMI 24.28 kg/m   Wt Readings from Last 3 Encounters:  02/09/17 179 lb (81.2 kg)  01/27/17 186 lb (84.4 kg)  01/27/17 186 lb (84.4 kg)    Physical Exam  Constitutional: He is oriented to person, place, and time. He appears well-developed and well-nourished.  HENT:  Head: Normocephalic and atraumatic.  Eyes: Conjunctivae and EOM are normal.  Neck: Normal range of motion.  Cardiovascular: Normal rate, regular rhythm and normal heart sounds.   Pulmonary/Chest: Effort normal and breath sounds normal.  Musculoskeletal: Normal range of motion.  Neurological: He is alert and oriented to person, place, and time.  Skin: No erythema.  Psychiatric: He has a normal mood and affect. His behavior is normal. Judgment and thought content normal.    Results  for orders placed or performed during the hospital encounter of 01/27/17  Lipase, blood  Result Value Ref Range   Lipase 130 (H) 11 - 51 U/L  Comprehensive metabolic panel  Result Value Ref Range   Sodium 135 135 - 145 mmol/L   Potassium 4.5 3.5 - 5.1 mmol/L   Chloride 105 101 - 111 mmol/L   CO2 20 (L) 22 - 32 mmol/L   Glucose, Bld 126 (H) 65 - 99 mg/dL   BUN 36 (H) 6 - 20 mg/dL   Creatinine, Ser 1.24 0.61 - 1.24 mg/dL   Calcium 9.7 8.9 - 10.3 mg/dL   Total Protein 7.1 6.5 - 8.1 g/dL   Albumin 4.1 3.5 - 5.0 g/dL   AST 31 15 - 41 U/L   ALT 28 17 - 63 U/L   Alkaline Phosphatase 84 38 - 126 U/L   Total Bilirubin 0.4 0.3 - 1.2 mg/dL   GFR calc non Af Amer 56 (L) >60 mL/min   GFR calc Af Amer >60 >60 mL/min   Anion gap 10 5 - 15  CBC  Result Value Ref Range   WBC 7.3 3.8 - 10.6 K/uL   RBC 4.53 4.40 - 5.90 MIL/uL   Hemoglobin 14.0 13.0 - 18.0 g/dL   HCT 41.0 40.0 - 52.0 %   MCV 90.7 80.0 - 100.0 fL   MCH 30.8 26.0 - 34.0 pg   MCHC 34.0 32.0 - 36.0 g/dL  RDW 13.1 11.5 - 14.5 %   Platelets 245 150 - 440 K/uL  Urinalysis, Complete w Microscopic  Result Value Ref Range   Color, Urine YELLOW (A) YELLOW   APPearance CLEAR (A) CLEAR   Specific Gravity, Urine 1.009 1.005 - 1.030   pH 5.0 5.0 - 8.0   Glucose, UA NEGATIVE NEGATIVE mg/dL   Hgb urine dipstick NEGATIVE NEGATIVE   Bilirubin Urine NEGATIVE NEGATIVE   Ketones, ur NEGATIVE NEGATIVE mg/dL   Protein, ur NEGATIVE NEGATIVE mg/dL   Nitrite NEGATIVE NEGATIVE   Leukocytes, UA NEGATIVE NEGATIVE   RBC / HPF 0-5 0 - 5 RBC/hpf   WBC, UA 0-5 0 - 5 WBC/hpf   Bacteria, UA NONE SEEN NONE SEEN   Squamous Epithelial / LPF NONE SEEN NONE SEEN   Mucus PRESENT    Hyaline Casts, UA PRESENT   Troponin I  Result Value Ref Range   Troponin I <0.03 <0.03 ng/mL      Assessment & Plan:   Problem List Items Addressed This Visit      Cardiovascular and Mediastinum   Hypertension - Primary    The current medical regimen is effective;   continue present plan and medications.       Relevant Orders   Basic metabolic panel   Bayer DCA Hb A1c Waived   LP+ALT+AST Piccolo, Waived   Aortic atherosclerosis ([place])    Still present        Digestive   Pancreatitis, alcoholic, acute    Patient's pancreatitis apparently resolved patient's largely cut back drinking and doing well. No further abdominal pain        Endocrine   Diabetes mellitus without complication ([place])    The current medical regimen is effective;  continue present plan and medications.       Relevant Orders   Basic metabolic panel   Bayer DCA Hb A1c Waived   LP+ALT+AST Piccolo, Waived     Other   Hyperlipidemia    The current medical regimen is effective;  continue present plan and medications.       Relevant Orders   Basic metabolic panel   Bayer DCA Hb A1c Waived   LP+ALT+AST Piccolo, [place]    Other Visit Diagnoses    Needs flu shot       Relevant Orders   Flu vaccine HIGH DOSE PF (Fluzone High dose) (Completed)       Follow up plan: Return in about 3 months (around 05/12/2017) for Hemoglobin A1c.

## 2017-02-10 LAB — BASIC METABOLIC PANEL WITH GFR
BUN/Creatinine Ratio: 23 (ref 10–24)
BUN: 52 mg/dL — ABNORMAL HIGH (ref 8–27)
CO2: 18 mmol/L — ABNORMAL LOW (ref 20–29)
Calcium: 9.5 mg/dL (ref 8.6–10.2)
Chloride: 104 mmol/L (ref 96–106)
Creatinine, Ser: 2.25 mg/dL — ABNORMAL HIGH (ref 0.76–1.27)
GFR calc Af Amer: 32 mL/min/1.73 — ABNORMAL LOW
GFR calc non Af Amer: 28 mL/min/1.73 — ABNORMAL LOW
Glucose: 158 mg/dL — ABNORMAL HIGH (ref 65–99)
Potassium: 5.3 mmol/L — ABNORMAL HIGH (ref 3.5–5.2)
Sodium: 138 mmol/L (ref 134–144)

## 2017-02-10 LAB — LP+ALT+AST PICCOLO, WAIVED
ALT (SGPT) Piccolo, Waived: 30 U/L (ref 10–47)
AST (SGOT) Piccolo, Waived: 33 U/L (ref 11–38)
Chol/HDL Ratio Piccolo,Waive: 3.5 mg/dL
Cholesterol Piccolo, Waived: 97 mg/dL
HDL Chol Piccolo, Waived: 28 mg/dL — ABNORMAL LOW
LDL Chol Calc Piccolo Waived: 30 mg/dL
Triglycerides Piccolo,Waived: 194 mg/dL — ABNORMAL HIGH
VLDL Chol Calc Piccolo,Waive: 39 mg/dL — ABNORMAL HIGH

## 2017-02-10 LAB — BAYER DCA HB A1C WAIVED: HB A1C (BAYER DCA - WAIVED): 6.5 %

## 2017-05-10 ENCOUNTER — Other Ambulatory Visit (INDEPENDENT_AMBULATORY_CARE_PROVIDER_SITE_OTHER): Payer: Self-pay | Admitting: Vascular Surgery

## 2017-05-12 ENCOUNTER — Ambulatory Visit: Payer: Medicare HMO | Admitting: Family Medicine

## 2017-05-12 ENCOUNTER — Encounter: Payer: Self-pay | Admitting: Family Medicine

## 2017-05-12 VITALS — BP 136/60 | HR 58 | Wt 180.0 lb

## 2017-05-12 DIAGNOSIS — E785 Hyperlipidemia, unspecified: Secondary | ICD-10-CM

## 2017-05-12 DIAGNOSIS — I10 Essential (primary) hypertension: Secondary | ICD-10-CM

## 2017-05-12 DIAGNOSIS — D692 Other nonthrombocytopenic purpura: Secondary | ICD-10-CM | POA: Diagnosis not present

## 2017-05-12 DIAGNOSIS — E119 Type 2 diabetes mellitus without complications: Secondary | ICD-10-CM

## 2017-05-12 LAB — BAYER DCA HB A1C WAIVED: HB A1C (BAYER DCA - WAIVED): 6.5 %

## 2017-05-12 NOTE — Assessment & Plan Note (Signed)
The current medical regimen is effective;  continue present plan and medications.  

## 2017-05-12 NOTE — Progress Notes (Signed)
BP 136/60 (BP Location: Left Arm)   Pulse (!) 58   Wt 180 lb (81.6 kg)   SpO2 99%   BMI 24.41 kg/m    Subjective:    Patient ID: William Ball, male    DOB: 11-18-1945, 72 y.o.   MRN: [id number]  HPI: William Ball is a 72 y.o. male  Patient follow-up doing well with medications blood pressure good control no issues from blood pressure meds.cholesterol doing well with Lipitor. Diabetes also doing well with metformin no low blood sugar spells.   Relevant past medical, surgical, family and social history reviewed and updated as indicated. Interim medical history since our last visit reviewed. Allergies and medications reviewed and updated.  Review of Systems  Constitutional: Negative.   Respiratory: Negative.   Cardiovascular: Negative.     Per HPI unless specifically indicated above     Objective:    BP 136/60 (BP Location: Left Arm)   Pulse (!) 58   Wt 180 lb (81.6 kg)   SpO2 99%   BMI 24.41 kg/m   Wt Readings from Last 3 Encounters:  05/12/17 180 lb (81.6 kg)  02/09/17 179 lb (81.2 kg)  01/27/17 186 lb (84.4 kg)    Physical Exam  Constitutional: He is oriented to person, place, and time. He appears well-developed and well-nourished.  HENT:  Head: Normocephalic and atraumatic.  Eyes: Conjunctivae and EOM are normal.  Neck: Normal range of motion.  Cardiovascular: Normal rate, regular rhythm and normal heart sounds.  Pulmonary/Chest: Effort normal and breath sounds normal.  Musculoskeletal: Normal range of motion.  Neurological: He is alert and oriented to person, place, and time.  Skin: No erythema.  Psychiatric: He has a normal mood and affect. His behavior is normal. Judgment and thought content normal.    Results for orders placed or performed in visit on 92/42/68  Basic metabolic panel  Result Value Ref Range   Glucose 158 (H) 65 - 99 mg/dL   BUN 52 (H) 8 - 27 mg/dL   Creatinine, Ser 2.25 (H) 0.76 - 1.27 mg/dL   GFR calc non Af Amer 28 (L) >59  mL/min/1.73   GFR calc Af Amer 32 (L) >59 mL/min/1.73   BUN/Creatinine Ratio 23 10 - 24   Sodium 138 134 - 144 mmol/L   Potassium 5.3 (H) 3.5 - 5.2 mmol/L   Chloride 104 96 - 106 mmol/L   CO2 18 (L) 20 - 29 mmol/L   Calcium 9.5 8.6 - 10.2 mg/dL  Bayer DCA Hb A1c Waived  Result Value Ref Range   Bayer DCA Hb A1c Waived 6.5 <7.0 %  LP+ALT+AST Piccolo, Waived  Result Value Ref Range   ALT (SGPT) Piccolo, Waived 30 10 - 47 U/L   AST (SGOT) Piccolo, Waived 33 11 - 38 U/L   Cholesterol Piccolo, Waived 97 <200 mg/dL   HDL Chol Piccolo, Waived 28 (L) >59 mg/dL   Triglycerides Piccolo,Waived 194 (H) <150 mg/dL   Chol/HDL Ratio Piccolo,Waive 3.5 mg/dL   LDL Chol Calc Piccolo Waived 30 <100 mg/dL   VLDL Chol Calc Piccolo,Waive 39 (H) <30 mg/dL      Assessment & Plan:   Problem List Items Addressed This Visit      Cardiovascular and Mediastinum   Hypertension - Primary    The current medical regimen is effective;  continue present plan and medications.       Relevant Orders   Bayer DCA Hb A1c Waived   Senile purpura ([place])  The current medical regimen is effective;  continue present plan and medications.         Endocrine   Diabetes mellitus without complication ([place])    The current medical regimen is effective;  continue present plan and medications.       Relevant Orders   Bayer DCA Hb A1c Waived     Other   Hyperlipidemia    The current medical regimen is effective;  continue present plan and medications.           Follow up plan: Return in about 3 months (around 08/10/2017) for Physical Exam, Hemoglobin A1c.

## 2017-06-03 ENCOUNTER — Telehealth: Payer: Self-pay

## 2017-06-03 NOTE — Telephone Encounter (Signed)
Copied from [place] [id number]. Topic: Medicare AWV >> Jun 03, 2017  1:41 PM [place], [place] A, LPN wrote: Reason for CRM: Called to schedule medicare annual wellness visit with [facility] at [facility]. Can be scheduled anytime between 06/04/17 and 08/11/2017. Patient can have labs and awv done prior to CPE with Dr.Crissman if patient prefers- please schedule between 4/3 and 4/8 with NHA if patient has labs drawn. Any questions please transder call to Tiffany Hill,LPN at [facility].

## 2017-06-19 ENCOUNTER — Other Ambulatory Visit: Payer: Self-pay | Admitting: Family Medicine

## 2017-06-19 DIAGNOSIS — I10 Essential (primary) hypertension: Secondary | ICD-10-CM

## 2017-06-23 ENCOUNTER — Telehealth: Payer: Self-pay | Admitting: *Deleted

## 2017-06-23 NOTE — Telephone Encounter (Signed)
Attempted to leave message for patient to notify them that it is time to schedule annual low dose lung cancer screening CT scan. However, this option is not available. Will attempt at a later date.

## 2017-06-25 ENCOUNTER — Other Ambulatory Visit: Payer: Self-pay | Admitting: Family Medicine

## 2017-06-25 DIAGNOSIS — I10 Essential (primary) hypertension: Secondary | ICD-10-CM

## 2017-06-27 ENCOUNTER — Other Ambulatory Visit: Payer: Self-pay | Admitting: Family Medicine

## 2017-06-27 DIAGNOSIS — E785 Hyperlipidemia, unspecified: Secondary | ICD-10-CM

## 2017-06-27 DIAGNOSIS — M1610 Unilateral primary osteoarthritis, unspecified hip: Secondary | ICD-10-CM

## 2017-07-01 ENCOUNTER — Telehealth: Payer: Self-pay | Admitting: *Deleted

## 2017-07-01 DIAGNOSIS — Z122 Encounter for screening for malignant neoplasm of respiratory organs: Secondary | ICD-10-CM

## 2017-07-01 DIAGNOSIS — Z87891 Personal history of nicotine dependence: Secondary | ICD-10-CM

## 2017-07-01 NOTE — Telephone Encounter (Signed)
Notified patient that annual lung cancer screening low dose CT scan is due currently or will be in near future. Confirmed that patient is within the age range of 55-77, and asymptomatic, (no signs or symptoms of lung cancer). Patient denies illness that would prevent curative treatment for lung cancer if found. Verified smoking history, (current, 83.5 pack year). The shared decision making visit was done 06/25/16. Patient is agreeable for CT scan being scheduled.

## 2017-07-08 ENCOUNTER — Ambulatory Visit
Admission: RE | Admit: 2017-07-08 | Discharge: 2017-07-08 | Disposition: A | Discharge status: Home / Self Care | Payer: Medicare HMO | Source: Ambulatory Visit | Attending: Oncology | Admitting: Oncology

## 2017-07-08 DIAGNOSIS — J439 Emphysema, unspecified: Secondary | ICD-10-CM | POA: Diagnosis not present

## 2017-07-08 DIAGNOSIS — R69 Illness, unspecified: Secondary | ICD-10-CM | POA: Diagnosis not present

## 2017-07-08 DIAGNOSIS — Z122 Encounter for screening for malignant neoplasm of respiratory organs: Secondary | ICD-10-CM | POA: Insufficient documentation

## 2017-07-08 DIAGNOSIS — I2510 Atherosclerotic heart disease of native coronary artery without angina pectoris: Secondary | ICD-10-CM | POA: Diagnosis not present

## 2017-07-08 DIAGNOSIS — I700 Atherosclerosis of aorta: Secondary | ICD-10-CM | POA: Diagnosis not present

## 2017-07-08 DIAGNOSIS — Z87891 Personal history of nicotine dependence: Secondary | ICD-10-CM | POA: Insufficient documentation

## 2017-07-10 ENCOUNTER — Telehealth: Payer: Self-pay | Admitting: Family Medicine

## 2017-07-10 MED ORDER — DOXYCYCLINE HYCLATE 100 MG PO TABS: 100.0000 mg | ORAL_TABLET | Freq: Two times a day (BID) | ORAL | 0 refills | Status: DC

## 2017-07-10 NOTE — Telephone Encounter (Signed)
-----   Message from Lieutenant Diego, RN sent at 07/09/2017  3:12 PM EST ----- Regarding: lung screening results Dr. Jeananne Rama, Please see results noted below on Mr. Gehrig annual lung screening scan. I'll coordinate the recommended follow up imaging, but I wanted to bring your attention to the possibility of bronchopneumonia so that you could assess the need for antibiotics. Please let me know if there is any way I can be of assistance. Shawn  IMPRESSION: 1. New areas of peribronchovascular ground-glass and/or consolidation in the upper lobes, right middle lobe and left lower lobe, most indicative of bronchopneumonia. Lung-RADS 0, incomplete. Additional lung cancer screening CT in 3-4 weeks is recommended in further evaluation. These results will be called to the ordering clinician or representative by the Radiologist Assistant, and communication documented in the PACS or zVision Dashboard. 2. Aortic atherosclerosis (ICD10-170.0). Coronary artery calcifications. 3.  Emphysema (ICD10-J43.9).

## 2017-07-10 NOTE — Telephone Encounter (Signed)
Was really sick last week. Has been feeling better since then. Still coughing up green stuff. Has not been running a fever. Based on CT from yesterday, will treat with doxycycline. Call if not getting better or getting worse. He is aware.

## 2017-07-10 NOTE — Telephone Encounter (Signed)
Called to check in on how he's feeling and possibly treat for bronchopneumonia. No way to leave message. Will try him again later.

## 2017-07-23 DIAGNOSIS — Z955 Presence of coronary angioplasty implant and graft: Secondary | ICD-10-CM | POA: Diagnosis not present

## 2017-07-23 DIAGNOSIS — E1151 Type 2 diabetes mellitus with diabetic peripheral angiopathy without gangrene: Secondary | ICD-10-CM | POA: Diagnosis not present

## 2017-07-23 DIAGNOSIS — Z803 Family history of malignant neoplasm of breast: Secondary | ICD-10-CM | POA: Diagnosis not present

## 2017-07-23 DIAGNOSIS — I10 Essential (primary) hypertension: Secondary | ICD-10-CM | POA: Diagnosis not present

## 2017-07-23 DIAGNOSIS — Z7902 Long term (current) use of antithrombotics/antiplatelets: Secondary | ICD-10-CM | POA: Diagnosis not present

## 2017-07-23 DIAGNOSIS — E785 Hyperlipidemia, unspecified: Secondary | ICD-10-CM | POA: Diagnosis not present

## 2017-07-23 DIAGNOSIS — Z8249 Family history of ischemic heart disease and other diseases of the circulatory system: Secondary | ICD-10-CM | POA: Diagnosis not present

## 2017-07-23 DIAGNOSIS — Z7984 Long term (current) use of oral hypoglycemic drugs: Secondary | ICD-10-CM | POA: Diagnosis not present

## 2017-07-23 DIAGNOSIS — R69 Illness, unspecified: Secondary | ICD-10-CM | POA: Diagnosis not present

## 2017-07-26 ENCOUNTER — Other Ambulatory Visit: Payer: Self-pay | Admitting: Family Medicine

## 2017-07-26 DIAGNOSIS — I10 Essential (primary) hypertension: Secondary | ICD-10-CM

## 2017-07-26 DIAGNOSIS — Z122 Encounter for screening for malignant neoplasm of respiratory organs: Secondary | ICD-10-CM

## 2017-07-26 DIAGNOSIS — R918 Other nonspecific abnormal finding of lung field: Secondary | ICD-10-CM

## 2017-07-26 DIAGNOSIS — Z87891 Personal history of nicotine dependence: Secondary | ICD-10-CM

## 2017-08-03 ENCOUNTER — Telehealth: Payer: Self-pay | Admitting: *Deleted

## 2017-08-03 NOTE — Telephone Encounter (Signed)
Attempted to leave message for patient to notify them that it is time to schedule annual low dose lung cancer screening CT scan. However, this option is not available.  

## 2017-08-03 NOTE — Telephone Encounter (Signed)
Notified patient that lung cancer screening low dose CT scan is due currently or will be in near future. Confirmed that patient is within the age range of 55-77, and asymptomatic, (no signs or symptoms of lung cancer). Patient denies illness that would prevent curative treatment for lung cancer if found. Verified smoking history, (current, 84.5 pack year). The shared decision making visit was done 06/25/16. Patient is agreeable for CT scan being scheduled.

## 2017-08-06 ENCOUNTER — Ambulatory Visit (INDEPENDENT_AMBULATORY_CARE_PROVIDER_SITE_OTHER): Payer: Medicare HMO

## 2017-08-06 VITALS — BP 146/60 | HR 62 | Temp 98.4°F | Resp 16 | Ht 69.5 in | Wt 175.1 lb

## 2017-08-06 DIAGNOSIS — E785 Hyperlipidemia, unspecified: Secondary | ICD-10-CM

## 2017-08-06 DIAGNOSIS — Z0000 Encounter for general adult medical examination without abnormal findings: Secondary | ICD-10-CM

## 2017-08-06 DIAGNOSIS — E119 Type 2 diabetes mellitus without complications: Secondary | ICD-10-CM | POA: Diagnosis not present

## 2017-08-06 DIAGNOSIS — R7989 Other specified abnormal findings of blood chemistry: Secondary | ICD-10-CM | POA: Diagnosis not present

## 2017-08-06 DIAGNOSIS — I10 Essential (primary) hypertension: Secondary | ICD-10-CM

## 2017-08-06 DIAGNOSIS — N400 Enlarged prostate without lower urinary tract symptoms: Secondary | ICD-10-CM

## 2017-08-06 LAB — URINALYSIS, ROUTINE W REFLEX MICROSCOPIC
Bilirubin, UA: NEGATIVE
Glucose, UA: NEGATIVE
Ketones, UA: NEGATIVE
Leukocytes, UA: NEGATIVE
Nitrite, UA: NEGATIVE
Protein, UA: NEGATIVE
RBC, UA: NEGATIVE
Specific Gravity, UA: 1.02 (ref 1.005–1.030)
Urobilinogen, Ur: 0.2 mg/dL (ref 0.2–1.0)
pH, UA: 5 (ref 5.0–7.5)

## 2017-08-06 LAB — BAYER DCA HB A1C WAIVED: HB A1C (BAYER DCA - WAIVED): 6.5 %

## 2017-08-06 NOTE — Patient Instructions (Addendum)
Mr. Lemmons , Thank you for taking time to come for your Medicare Wellness Visit. I appreciate your ongoing commitment to your health goals. Please review the following plan we discussed and let me know if I can assist you in the future.   Screening recommendations/referrals: Colonoscopy: completed 07/17/2016 Recommended yearly ophthalmology/optometry visit for glaucoma screening and checkup Recommended yearly dental visit for hygiene and checkup  Vaccinations: Influenza vaccine: up to date Pneumococcal vaccine: up to date Tdap vaccine: up to date Shingles vaccine: eligible, check with your insurance company for coverage information     Advanced directives: Advance directive discussed with you today. I have provided a copy for you to complete at home and have notarized. Once this is complete please bring a copy in to our office so we can scan it into your chart.  Conditions/risks identified: Smoking cessation discussed  Next appointment: Follow up on 08/11/2017 at [facility] with Dr.Crissman, Follow up in one year for your annual wellness exam.   Preventive Care 65 Years and Older, Male Preventive care refers to lifestyle choices and visits with your health care provider that can promote health and wellness. What does preventive care include?  A yearly physical exam. This is also called an annual well check.  Dental exams once or twice a year.  Routine eye exams. Ask your health care provider how often you should have your eyes checked.  Personal lifestyle choices, including:  Daily care of your teeth and gums.  Regular physical activity.  Eating a healthy diet.  Avoiding tobacco and drug use.  Limiting alcohol use.  Practicing safe sex.  Taking low doses of aspirin every day.  Taking vitamin and mineral supplements as recommended by your health care provider. What happens during an annual well check? The services and screenings done by your health care provider during your annual  well check will depend on your age, overall health, lifestyle risk factors, and family history of disease. Counseling  Your health care provider may ask you questions about your:  Alcohol use.  Tobacco use.  Drug use.  Emotional well-being.  Home and relationship well-being.  Sexual activity.  Eating habits.  History of falls.  Memory and ability to understand (cognition).  Work and work Statistician. Screening  You may have the following tests or measurements:  Height, weight, and BMI.  Blood pressure.  Lipid and cholesterol levels. These may be checked every 5 years, or more frequently if you are over 27 years old.  Skin check.  Lung cancer screening. You may have this screening every year starting at age 33 if you have a 30-pack-year history of smoking and currently smoke or have quit within the past 15 years.  Fecal occult blood test (FOBT) of the stool. You may have this test every year starting at age 41.  Flexible sigmoidoscopy or colonoscopy. You may have a sigmoidoscopy every 5 years or a colonoscopy every 10 years starting at age 46.  Prostate cancer screening. Recommendations will vary depending on your family history and other risks.  Hepatitis C blood test.  Hepatitis B blood test.  Sexually transmitted disease (STD) testing.  Diabetes screening. This is done by checking your blood sugar (glucose) after you have not eaten for a while (fasting). You may have this done every 1-3 years.  Abdominal aortic aneurysm (AAA) screening. You may need this if you are a current or former smoker.  Osteoporosis. You may be screened starting at age 46 if you are at high risk. Talk  with your health care provider about your test results, treatment options, and if necessary, the need for more tests. Vaccines  Your health care provider may recommend certain vaccines, such as:  Influenza vaccine. This is recommended every year.  Tetanus, diphtheria, and acellular  pertussis (Tdap, Td) vaccine. You may need a Td booster every 10 years.  Zoster vaccine. You may need this after age 18.  Pneumococcal 13-valent conjugate (PCV13) vaccine. One dose is recommended after age 25.  Pneumococcal polysaccharide (PPSV23) vaccine. One dose is recommended after age 89. Talk to your health care provider about which screenings and vaccines you need and how often you need them. This information is not intended to replace advice given to you by your health care provider. Make sure you discuss any questions you have with your health care provider. Document Released: 05/18/2015 Document Revised: 01/09/2016 Document Reviewed: 02/20/2015 Elsevier Interactive Patient Education  2017 [place] Prevention in the Home Falls can cause injuries. They can happen to people of all ages. There are many things you can do to make your home safe and to help prevent falls. What can I do on the outside of my home?  Regularly fix the edges of walkways and driveways and fix any cracks.  Remove anything that might make you trip as you walk through a door, such as a raised step or threshold.  Trim any bushes or trees on the path to your home.  Use bright outdoor lighting.  Clear any walking paths of anything that might make someone trip, such as rocks or tools.  Regularly check to see if handrails are loose or broken. Make sure that both sides of any steps have handrails.  Any raised decks and porches should have guardrails on the edges.  Have any leaves, snow, or ice cleared regularly.  Use sand or salt on walking paths during winter.  Clean up any spills in your garage right away. This includes oil or grease spills. What can I do in the bathroom?  Use night lights.  Install grab bars by the toilet and in the tub and shower. Do not use towel bars as grab bars.  Use non-skid mats or decals in the tub or shower.  If you need to sit down in the shower, use a plastic,  non-slip stool.  Keep the floor dry. Clean up any water that spills on the floor as soon as it happens.  Remove soap buildup in the tub or shower regularly.  Attach bath mats securely with double-sided non-slip rug tape.  Do not have throw rugs and other things on the floor that can make you trip. What can I do in the bedroom?  Use night lights.  Make sure that you have a light by your bed that is easy to reach.  Do not use any sheets or blankets that are too big for your bed. They should not hang down onto the floor.  Have a firm chair that has side arms. You can use this for support while you get dressed.  Do not have throw rugs and other things on the floor that can make you trip. What can I do in the kitchen?  Clean up any spills right away.  Avoid walking on wet floors.  Keep items that you use a lot in easy-to-reach places.  If you need to reach something above you, use a strong step stool that has a grab bar.  Keep electrical cords out of the way.  Do  not use floor polish or wax that makes floors slippery. If you must use wax, use non-skid floor wax.  Do not have throw rugs and other things on the floor that can make you trip. What can I do with my stairs?  Do not leave any items on the stairs.  Make sure that there are handrails on both sides of the stairs and use them. Fix handrails that are broken or loose. Make sure that handrails are as long as the stairways.  Check any carpeting to make sure that it is firmly attached to the stairs. Fix any carpet that is loose or worn.  Avoid having throw rugs at the top or bottom of the stairs. If you do have throw rugs, attach them to the floor with carpet tape.  Make sure that you have a light switch at the top of the stairs and the bottom of the stairs. If you do not have them, ask someone to add them for you. What else can I do to help prevent falls?  Wear shoes that:  Do not have high heels.  Have rubber  bottoms.  Are comfortable and fit you well.  Are closed at the toe. Do not wear sandals.  If you use a stepladder:  Make sure that it is fully opened. Do not climb a closed stepladder.  Make sure that both sides of the stepladder are locked into place.  Ask someone to hold it for you, if possible.  Clearly mark and make sure that you can see:  Any grab bars or handrails.  First and last steps.  Where the edge of each step is.  Use tools that help you move around (mobility aids) if they are needed. These include:  Canes.  Walkers.  Scooters.  Crutches.  Turn on the lights when you go into a dark area. Replace any light bulbs as soon as they burn out.  Set up your furniture so you have a clear path. Avoid moving your furniture around.  If any of your floors are uneven, fix them.  If there are any pets around you, be aware of where they are.  Review your medicines with your doctor. Some medicines can make you feel dizzy. This can increase your chance of falling. Ask your doctor what other things that you can do to help prevent falls. This information is not intended to replace advice given to you by your health care provider. Make sure you discuss any questions you have with your health care provider. Document Released: 02/15/2009 Document Revised: 09/27/2015 Document Reviewed: 05/26/2014 Elsevier Interactive Patient Education  2017 [employer].   Steps to Quit Smoking Smoking tobacco can be bad for your health. It can also affect almost every organ in your body. Smoking puts you and people around you at risk for many serious long-lasting (chronic) diseases. Quitting smoking is hard, but it is one of the best things that you can do for your health. It is never too late to quit. What are the benefits of quitting smoking? When you quit smoking, you lower your risk for getting serious diseases and conditions. They can include:  Lung cancer or lung disease.  Heart  disease.  Stroke.  Heart attack.  Not being able to have children (infertility).  Weak bones (osteoporosis) and broken bones (fractures).  If you have coughing, wheezing, and shortness of breath, those symptoms may get better when you quit. You may also get sick less often. If you are pregnant, quitting smoking  can help to lower your chances of having a baby of low birth weight. What can I do to help me quit smoking? Talk with your doctor about what can help you quit smoking. Some things you can do (strategies) include:  Quitting smoking totally, instead of slowly cutting back how much you smoke over a period of time.  Going to in-person counseling. You are more likely to quit if you go to many counseling sessions.  Using resources and support systems, such as: ? Database administrator with a Social worker. ? Phone quitlines. ? Careers information officer. ? Support groups or group counseling. ? Text messaging programs. ? Mobile phone apps or applications.  Taking medicines. Some of these medicines may have nicotine in them. If you are pregnant or breastfeeding, do not take any medicines to quit smoking unless your doctor says it is okay. Talk with your doctor about counseling or other things that can help you.  Talk with your doctor about using more than one strategy at the same time, such as taking medicines while you are also going to in-person counseling. This can help make quitting easier. What things can I do to make it easier to quit? Quitting smoking might feel very hard at first, but there is a lot that you can do to make it easier. Take these steps:  Talk to your family and friends. Ask them to support and encourage you.  Call phone quitlines, reach out to support groups, or work with a Social worker.  Ask people who smoke to not smoke around you.  Avoid places that make you want (trigger) to smoke, such as: ? Bars. ? Parties. ? Smoke-break areas at work.  Spend time with people  who do not smoke.  Lower the stress in your life. Stress can make you want to smoke. Try these things to help your stress: ? Getting regular exercise. ? Deep-breathing exercises. ? Yoga. ? Meditating. ? Doing a body scan. To do this, close your eyes, focus on one area of your body at a time from head to toe, and notice which parts of your body are tense. Try to relax the muscles in those areas.  Download or buy apps on your mobile phone or tablet that can help you stick to your quit plan. There are many free apps, such as QuitGuide from the [employer] Office manager for Disease Control and Prevention). You can find more support from smokefree.gov and other websites.  This information is not intended to replace advice given to you by your health care provider. Make sure you discuss any questions you have with your health care provider. Document Released: 02/15/2009 Document Revised: 12/18/2015 Document Reviewed: 09/05/2014 Elsevier Interactive Patient Education  2018 [employer].

## 2017-08-06 NOTE — Progress Notes (Signed)
Subjective:   William Ball is a 72 y.o. male who presents for Medicare Annual/Subsequent preventive examination.  Review of Systems:   Cardiac Risk Factors include: hypertension;advanced age (>34men, >26 women);dyslipidemia;smoking/ tobacco exposure     Objective:    Vitals: BP (!) 146/60 (BP Location: Left Arm, Patient Position: Sitting)   Pulse 62   Temp 98.4 F (36.9 C) (Temporal)   Resp 16   Ht 5' 9.5" (1.765 m)   Wt 175 lb 1.6 oz (79.4 kg)   BMI 25.49 kg/m   Body mass index is 25.49 kg/m.  Advanced Directives 08/06/2017 01/27/2017 01/27/2017 09/17/2016 11/13/2015  Does Patient Have a Medical Advance Directive? No No No No No  Would patient like information on creating a medical advance directive? Yes (MAU/Ambulatory/Procedural Areas - Information given) No - Patient declined - - -    Tobacco Social History   Tobacco Use  Smoking Status Current Every Day Smoker  . Packs/day: 1.50  . Years: 55.00  . Pack years: 82.50  . Types: Cigarettes  Smokeless Tobacco Never Used     Ready to quit: Yes Counseling given: Yes   Clinical Intake:  Pre-visit preparation completed: Yes  Pain : No/denies pain     Nutritional Status: BMI 25 -29 Overweight Nutritional Risks: None Diabetes: Yes CBG done?: No Did pt. bring in CBG monitor from home?: No  How often do you need to have someone help you when you read instructions, pamphlets, or other written materials from your doctor or pharmacy?: 3 - Sometimes(due to vision ) What is the last grade level you completed in school?: 8th grade  Interpreter Needed?: No  Information entered by :: Mahad Newstrom,LPN   Past Medical History:  Diagnosis Date  . Diabetes mellitus without complication (HCC)    borderline  . Hepatomegaly   . Hyperlipidemia   . Hypertension   . Substance abuse [facility])    Past Surgical History:  Procedure Laterality Date  . ANGIOPLASTY    . APPENDECTOMY    . BONE MARROW TRANSPLANT Left    ankle.   patient unsure of this but thinks this is what happened  . CARDIAC SURGERY    . CATARACT EXTRACTION W/PHACO Left 11/13/2015   Procedure: CATARACT EXTRACTION PHACO AND INTRAOCULAR LENS PLACEMENT (IOC);  Surgeon: [name], MD;  Location: [facility] ORS;  Service: Ophthalmology;  Laterality: Left;  [place] 1.06 AP% 23.9 CDE 15.81 Fluid pack lot # [id number]  . CATARACT EXTRACTION W/PHACO Right 11/27/2015   Procedure: CATARACT EXTRACTION PHACO AND INTRAOCULAR LENS PLACEMENT (IOC);  Surgeon: [name], MD;  Location: [facility] ORS;  Service: Ophthalmology;  Laterality: Right;  [place] 1.06 AP% 23.8 CDE 15.80 Fluid pack lot # [id number]  . COLONOSCOPY  2012   polyps, diverticulosis , 48yr repeat  . COLONOSCOPY WITH PROPOFOL N/A 07/17/2016   Procedure: COLONOSCOPY WITH PROPOFOL;  Surgeon: [name], MD;  Location: [facility] ENDOSCOPY;  Service: Endoscopy;  Laterality: N/A;  . FRACTURE SURGERY Left 1982   foot, leg.unsure if metal in these parts.possibly replaced ankle  . PERIPHERAL VASCULAR CATHETERIZATION Left 12/27/2015   Procedure: Lower Extremity Angiography;  Surgeon: Algernon Huxley, MD;  Location: [facility];  Service: Cardiovascular;  Laterality: Left;   Family History  Problem Relation Age of Onset  . Cancer Mother   . Heart disease Mother   . Hyperlipidemia Mother   . Heart disease Father   . Hyperlipidemia Father    Social History   Socioeconomic History  . Marital  status: Married    Spouse name: Not on file  . Number of children: Not on file  . Years of education: Not on file  . Highest education level: Not on file  Occupational History  . Not on file  Social Needs  . Financial resource strain: Not hard at all  . Food insecurity:    Worry: Never true    Inability: Never true  . Transportation needs:    Medical: No    Non-medical: No  Tobacco Use  . Smoking status: Current Every Day Smoker    Packs/day: 1.50    Years: 55.00    Pack years: 82.50    Types: Cigarettes  .  Smokeless tobacco: Never Used  Substance and Sexual Activity  . Alcohol use: Yes    Alcohol/week: 16.8 oz    Types: 28 Cans of beer per week    Comment: 4+ daily beer  . Drug use: No  . Sexual activity: Not on file  Lifestyle  . Physical activity:    Days per week: 0 days    Minutes per session: 0 min  . Stress: Not at all  Relationships  . Social connections:    Talks on phone: Never    Gets together: Once a week    Attends religious service: Never    Active member of club or organization: No    Attends meetings of clubs or organizations: Never    Relationship status: Married  Other Topics Concern  . Not on file  Social History Narrative   Works 5 days a week 8 hour days     Outpatient Encounter Medications as of 08/06/2017  Medication Sig  . amLODipine (NORVASC) 10 MG tablet TAKE 1 TABLET (10 MG TOTAL) BY MOUTH DAILY.  [name] atorvastatin (LIPITOR) 20 MG tablet TAKE 1 TABLET BY MOUTH DAILY  . chlorthalidone (HYGROTON) 25 MG tablet TAKE 1 TABLET (25 MG TOTAL) BY MOUTH DAILY.  [name] clopidogrel (PLAVIX) 75 MG tablet TAKE 1 TABLET BY MOUTH EVERY DAY  . lisinopril (PRINIVIL,ZESTRIL) 40 MG tablet TAKE 1 TABLET (40 MG TOTAL) BY MOUTH DAILY.  . meloxicam (MOBIC) 15 MG tablet TAKE 1 TABLET (15 MG TOTAL) BY MOUTH DAILY AS NEEDED FOR PAIN.  . metFORMIN (GLUCOPHAGE-XR) 500 MG 24 hr tablet Take 0.5 tablets (250 mg total) by mouth daily.  . metoprolol succinate (TOPROL-XL) 50 MG 24 hr tablet Take 1 tablet (50 mg total) by mouth daily.  . [DISCONTINUED] doxycycline (VIBRA-TABS) 100 MG tablet Take 1 tablet (100 mg total) by mouth 2 (two) times daily. TAKE WITH FOOD (Patient not taking: Reported on 08/06/2017)   No facility-administered encounter medications on file as of 08/06/2017.     Activities of Daily Living In your present state of health, do you have any difficulty performing the following activities: 08/06/2017  Hearing? N  Vision? Y  Comment close up   Difficulty concentrating or making  decisions? N  Walking or climbing stairs? Y  Comment pain in hips   Dressing or bathing? N  Doing errands, shopping? N  Preparing Food and eating ? N  Using the Toilet? N  In the past six months, have you accidently leaked urine? N  Do you have problems with loss of bowel control? N  Managing your Medications? N  Managing your Finances? N  Housekeeping or managing your Housekeeping? N  Some recent data might be hidden    Patient Care Team: Guadalupe Maple, MD as PCP - General (Family Medicine) Loistine Simas  U, MD as Consulting Physician (Gastroenterology)   Assessment:   This is a routine wellness examination for [place].  Exercise Activities and Dietary recommendations Current Exercise Habits: The patient has a physically strenous job, but has no regular exercise apart from work.(a lot of walking on the job), Exercise limited by: None identified  Goals    . Quit Smoking     Smoking cessation discussed       Fall Risk Fall Risk  08/06/2017 02/09/2017 07/28/2016 06/12/2016 12/05/2015  Falls in the past year? No No No No No   Is the patient's home free of loose throw rugs in walkways, pet beds, electrical cords, etc?   yes      Grab bars in the bathroom? no      Handrails on the stairs?   yes      Adequate lighting?   yes  Timed Get Up and Go Performed: Completed in 9 seconds with no use of assistive devices, steady gait. No intervention needed at this time.   Depression Screen PHQ 2/9 Scores 08/06/2017 02/09/2017 06/12/2016 12/05/2015  PHQ - 2 Score 0 0 0 0    Cognitive Function     6CIT Screen 08/06/2017  What Year? 0 points  What month? 0 points  What time? 0 points  Count back from 20 0 points  Months in reverse 0 points  Repeat phrase 0 points  Total Score 0    Immunization History  Administered Date(s) Administered  . Influenza, High Dose Seasonal PF 03/10/2016, 02/09/2017  . Influenza-Unspecified 06/05/2014  . Pneumococcal Conjugate-13 06/05/2014  .  Pneumococcal-Unspecified 12/24/2009  . Td 05/05/2005  . Tdap 12/05/2015    Qualifies for Shingles Vaccine? Yes, discussed shingrix vaccine.   Screening Tests Health Maintenance  Topic Date Due  . OPHTHALMOLOGY EXAM  09/05/2017  . FOOT EXAM  10/30/2017  . HEMOGLOBIN A1C  11/09/2017  . INFLUENZA VACCINE  12/03/2017  . COLONOSCOPY  07/17/2021  . TETANUS/TDAP  12/04/2025  . Hepatitis C Screening  Completed  . PNA vac Low Risk Adult  Completed   Cancer Screenings: Lung: Low Dose CT Chest recommended if Age 50-80 years, 30 pack-year currently smoking OR have quit w/in 15years. Patient does qualify.completed- and having repeat scan soon.  Colorectal: completed 07/17/2016  Additional Screenings:  Hepatitis C Screening: completed 06/07/2015      Plan:    I have personally reviewed and addressed the Medicare Annual Wellness questionnaire and have noted the following in the patient's chart:  A. Medical and social history B. Use of alcohol, tobacco or illicit drugs  C. Current medications and supplements D. Functional ability and status E.  Nutritional status F.  Physical activity G. Advance directives H. List of other physicians I.  Hospitalizations, surgeries, and ER visits in previous 12 months J.  [place] such as hearing and vision if needed, cognitive and depression L. Referrals and appointments   In addition, I have reviewed and discussed with patient certain preventive protocols, quality metrics, and best practice recommendations. A written personalized care plan for preventive services as well as general preventive health recommendations were provided to patient.   Signed,  Tyler Aas, LPN Nurse Health Advisor   Nurse Notes:none

## 2017-08-07 ENCOUNTER — Encounter: Payer: Self-pay | Admitting: Family Medicine

## 2017-08-07 LAB — COMPREHENSIVE METABOLIC PANEL WITH GFR
ALT: 34 IU/L (ref 0–44)
AST: 27 IU/L (ref 0–40)
Albumin/Globulin Ratio: 2.1 (ref 1.2–2.2)
Albumin: 4.7 g/dL (ref 3.5–4.8)
Alkaline Phosphatase: 87 IU/L (ref 39–117)
BUN/Creatinine Ratio: 24 (ref 10–24)
BUN: 35 mg/dL — ABNORMAL HIGH (ref 8–27)
Bilirubin Total: 0.3 mg/dL (ref 0.0–1.2)
CO2: 17 mmol/L — ABNORMAL LOW (ref 20–29)
Calcium: 9.6 mg/dL (ref 8.6–10.2)
Chloride: 109 mmol/L — ABNORMAL HIGH (ref 96–106)
Creatinine, Ser: 1.44 mg/dL — ABNORMAL HIGH (ref 0.76–1.27)
GFR calc Af Amer: 56 mL/min/1.73 — ABNORMAL LOW
GFR calc non Af Amer: 48 mL/min/1.73 — ABNORMAL LOW
Globulin, Total: 2.2 g/dL (ref 1.5–4.5)
Glucose: 98 mg/dL (ref 65–99)
Potassium: 4.8 mmol/L (ref 3.5–5.2)
Sodium: 143 mmol/L (ref 134–144)
Total Protein: 6.9 g/dL (ref 6.0–8.5)

## 2017-08-07 LAB — LIPID PANEL W/O CHOL/HDL RATIO
Cholesterol, Total: 112 mg/dL (ref 100–199)
HDL: 37 mg/dL — ABNORMAL LOW
LDL Calculated: 42 mg/dL (ref 0–99)
Triglycerides: 167 mg/dL — ABNORMAL HIGH (ref 0–149)
VLDL Cholesterol Cal: 33 mg/dL (ref 5–40)

## 2017-08-07 LAB — CBC WITH DIFFERENTIAL/PLATELET
Basophils Absolute: 0 x10E3/uL (ref 0.0–0.2)
Basos: 0 %
EOS (ABSOLUTE): 0.5 x10E3/uL — ABNORMAL HIGH (ref 0.0–0.4)
Eos: 8 %
Hematocrit: 40.2 % (ref 37.5–51.0)
Hemoglobin: 13.2 g/dL (ref 13.0–17.7)
Immature Grans (Abs): 0 x10E3/uL (ref 0.0–0.1)
Immature Granulocytes: 0 %
Lymphocytes Absolute: 2.2 x10E3/uL (ref 0.7–3.1)
Lymphs: 32 %
MCH: 30.3 pg (ref 26.6–33.0)
MCHC: 32.8 g/dL (ref 31.5–35.7)
MCV: 92 fL (ref 79–97)
Monocytes Absolute: 0.6 x10E3/uL (ref 0.1–0.9)
Monocytes: 8 %
Neutrophils Absolute: 3.4 x10E3/uL (ref 1.4–7.0)
Neutrophils: 52 %
Platelets: 248 x10E3/uL (ref 150–379)
RBC: 4.36 x10E6/uL (ref 4.14–5.80)
RDW: 13.5 % (ref 12.3–15.4)
WBC: 6.7 x10E3/uL (ref 3.4–10.8)

## 2017-08-07 LAB — PSA: Prostate Specific Ag, Serum: 0.7 ng/mL (ref 0.0–4.0)

## 2017-08-07 LAB — TSH: TSH: 2.64 u[IU]/mL (ref 0.450–4.500)

## 2017-08-10 ENCOUNTER — Ambulatory Visit
Admission: RE | Admit: 2017-08-10 | Discharge: 2017-08-10 | Disposition: A | Discharge status: Home / Self Care | Payer: Medicare HMO | Source: Ambulatory Visit | Attending: Oncology | Admitting: Oncology

## 2017-08-10 DIAGNOSIS — Z87891 Personal history of nicotine dependence: Secondary | ICD-10-CM

## 2017-08-10 DIAGNOSIS — J432 Centrilobular emphysema: Secondary | ICD-10-CM | POA: Diagnosis not present

## 2017-08-10 DIAGNOSIS — R918 Other nonspecific abnormal finding of lung field: Secondary | ICD-10-CM | POA: Diagnosis not present

## 2017-08-10 DIAGNOSIS — I2510 Atherosclerotic heart disease of native coronary artery without angina pectoris: Secondary | ICD-10-CM | POA: Insufficient documentation

## 2017-08-10 DIAGNOSIS — Z122 Encounter for screening for malignant neoplasm of respiratory organs: Secondary | ICD-10-CM | POA: Insufficient documentation

## 2017-08-10 DIAGNOSIS — I700 Atherosclerosis of aorta: Secondary | ICD-10-CM | POA: Insufficient documentation

## 2017-08-10 DIAGNOSIS — J439 Emphysema, unspecified: Secondary | ICD-10-CM | POA: Diagnosis not present

## 2017-08-11 ENCOUNTER — Encounter: Payer: Self-pay | Admitting: Family Medicine

## 2017-08-11 ENCOUNTER — Ambulatory Visit (INDEPENDENT_AMBULATORY_CARE_PROVIDER_SITE_OTHER): Payer: Medicare HMO | Admitting: Family Medicine

## 2017-08-11 VITALS — BP 128/60 | HR 62 | Ht 70.0 in | Wt 177.9 lb

## 2017-08-11 DIAGNOSIS — N400 Enlarged prostate without lower urinary tract symptoms: Secondary | ICD-10-CM

## 2017-08-11 DIAGNOSIS — R7989 Other specified abnormal findings of blood chemistry: Secondary | ICD-10-CM | POA: Diagnosis not present

## 2017-08-11 DIAGNOSIS — E7849 Other hyperlipidemia: Secondary | ICD-10-CM

## 2017-08-11 DIAGNOSIS — I700 Atherosclerosis of aorta: Secondary | ICD-10-CM | POA: Diagnosis not present

## 2017-08-11 DIAGNOSIS — I10 Essential (primary) hypertension: Secondary | ICD-10-CM | POA: Diagnosis not present

## 2017-08-11 DIAGNOSIS — D692 Other nonthrombocytopenic purpura: Secondary | ICD-10-CM | POA: Diagnosis not present

## 2017-08-11 DIAGNOSIS — E785 Hyperlipidemia, unspecified: Secondary | ICD-10-CM

## 2017-08-11 DIAGNOSIS — E119 Type 2 diabetes mellitus without complications: Secondary | ICD-10-CM | POA: Diagnosis not present

## 2017-08-11 DIAGNOSIS — Z7189 Other specified counseling: Secondary | ICD-10-CM

## 2017-08-11 MED ORDER — ATORVASTATIN CALCIUM 20 MG PO TABS: 20.0000 mg | ORAL_TABLET | Freq: Every day | ORAL | 4 refills | Status: DC

## 2017-08-11 MED ORDER — LISINOPRIL 40 MG PO TABS: 40.0000 mg | ORAL_TABLET | Freq: Every day | ORAL | 4 refills | Status: DC

## 2017-08-11 MED ORDER — AMLODIPINE BESYLATE 10 MG PO TABS: 10.0000 mg | ORAL_TABLET | Freq: Every day | ORAL | 4 refills | Status: DC

## 2017-08-11 MED ORDER — CLOPIDOGREL BISULFATE 75 MG PO TABS: 75.0000 mg | ORAL_TABLET | Freq: Every day | ORAL | 4 refills | Status: DC

## 2017-08-11 MED ORDER — CHLORTHALIDONE 25 MG PO TABS: 25.0000 mg | ORAL_TABLET | Freq: Every day | ORAL | 4 refills | Status: DC

## 2017-08-11 MED ORDER — METOPROLOL SUCCINATE ER 50 MG PO TB24: 50.0000 mg | ORAL_TABLET | Freq: Every day | ORAL | 4 refills | Status: DC

## 2017-08-11 MED ORDER — METFORMIN HCL ER 500 MG PO TB24: 250.0000 mg | ORAL_TABLET | Freq: Every day | ORAL | 4 refills | Status: DC

## 2017-08-11 NOTE — Assessment & Plan Note (Signed)
The current medical regimen is effective;  continue present plan and medications.  

## 2017-08-11 NOTE — Assessment & Plan Note (Signed)
stable °

## 2017-08-11 NOTE — Assessment & Plan Note (Signed)
A voluntary discussion about advance care planning including the explanation and discussion of advance directives was extensively discussed  with the patient.  Explanation about the health care proxy and Living will was reviewed and packet with forms with explanation of how to fill them out was given.  .  Time spent:16+ min       Individuals present. Pt

## 2017-08-11 NOTE — Progress Notes (Signed)
BP 128/60 (BP Location: Left Arm)   Pulse 62   Ht 5' 10"  (1.778 m)   Wt 177 lb 14.4 oz (80.7 kg)   SpO2 98%   BMI 25.53 kg/m    Subjective:    Patient ID: William Ball, male    DOB: 01/15/1946, 72 y.o.   MRN: [id number]  HPI: William Ball is a 72 y.o. male  Chief Complaint  Patient presents with  . Annual Exam  Patient follow-up had chest CT for high risk smoking which was essentially normal except for atherosclerosis changes. Diabetes doing well with no complaints no low blood sugar spells. Blood pressure also doing well without problems. Taking Lipitor without issues. Claudication stable.   Relevant past medical, surgical, family and social history reviewed and updated as indicated. Interim medical history since our last visit reviewed. Allergies and medications reviewed and updated.  Review of Systems  Constitutional: Negative.   HENT: Negative.   Eyes: Negative.   Respiratory: Negative.   Cardiovascular: Negative.   Gastrointestinal: Negative.   Endocrine: Negative.   Genitourinary: Negative.   Musculoskeletal: Negative.   Skin: Negative.   Allergic/Immunologic: Negative.   Neurological: Negative.   Hematological: Negative.   Psychiatric/Behavioral: Negative.     Per HPI unless specifically indicated above     Objective:    BP 128/60 (BP Location: Left Arm)   Pulse 62   Ht 5' 10"  (1.778 m)   Wt 177 lb 14.4 oz (80.7 kg)   SpO2 98%   BMI 25.53 kg/m   Wt Readings from Last 3 Encounters:  08/11/17 177 lb 14.4 oz (80.7 kg)  08/06/17 175 lb 1.6 oz (79.4 kg)  07/08/17 181 lb (82.1 kg)    Physical Exam  Constitutional: He is oriented to person, place, and time. He appears well-developed and well-nourished.  HENT:  Head: Normocephalic and atraumatic.  Right Ear: External ear normal.  Left Ear: External ear normal.  Eyes: Pupils are equal, round, and reactive to light. Conjunctivae and EOM are normal.  Neck: Normal range of motion. Neck supple.    Cardiovascular: Normal rate, regular rhythm, normal heart sounds and intact distal pulses.  Pulmonary/Chest: Effort normal and breath sounds normal.  Abdominal: Soft. Bowel sounds are normal. There is no splenomegaly or hepatomegaly.  Genitourinary: Rectum normal and penis normal.  Genitourinary Comments: BPH changes  Musculoskeletal: Normal range of motion.  Neurological: He is alert and oriented to person, place, and time. He has normal reflexes.  Skin: No rash noted. No erythema.  Psychiatric: He has a normal mood and affect. His behavior is normal. Judgment and thought content normal.    Results for orders placed or performed in visit on 08/06/17  Urinalysis, Routine w reflex microscopic  Result Value Ref Range   Specific Gravity, UA 1.020 1.005 - 1.030   pH, UA 5.0 5.0 - 7.5   Color, UA Yellow Yellow   Appearance Ur Clear Clear   Leukocytes, UA Negative Negative   Protein, UA Negative Negative/Trace   Glucose, UA Negative Negative   Ketones, UA Negative Negative   RBC, UA Negative Negative   Bilirubin, UA Negative Negative   Urobilinogen, Ur 0.2 0.2 - 1.0 mg/dL   Nitrite, UA Negative Negative  TSH  Result Value Ref Range   TSH 2.640 0.450 - 4.500 uIU/mL  PSA  Result Value Ref Range   Prostate Specific Ag, Serum 0.7 0.0 - 4.0 ng/mL  Lipid Panel w/o Chol/HDL Ratio  Result Value Ref Range  Cholesterol, Total 112 100 - 199 mg/dL   Triglycerides 167 (H) 0 - 149 mg/dL   HDL 37 (L) >39 mg/dL   VLDL Cholesterol Cal 33 5 - 40 mg/dL   LDL Calculated 42 0 - 99 mg/dL  Comp Met (CMET)  Result Value Ref Range   Glucose 98 65 - 99 mg/dL   BUN 35 (H) 8 - 27 mg/dL   Creatinine, Ser 1.44 (H) 0.76 - 1.27 mg/dL   GFR calc non Af Amer 48 (L) >59 mL/min/1.73   GFR calc Af Amer 56 (L) >59 mL/min/1.73   BUN/Creatinine Ratio 24 10 - 24   Sodium 143 134 - 144 mmol/L   Potassium 4.8 3.5 - 5.2 mmol/L   Chloride 109 (H) 96 - 106 mmol/L   CO2 17 (L) 20 - 29 mmol/L   Calcium 9.6 8.6 -  10.2 mg/dL   Total Protein 6.9 6.0 - 8.5 g/dL   Albumin 4.7 3.5 - 4.8 g/dL   Globulin, Total 2.2 1.5 - 4.5 g/dL   Albumin/Globulin Ratio 2.1 1.2 - 2.2   Bilirubin Total 0.3 0.0 - 1.2 mg/dL   Alkaline Phosphatase 87 39 - 117 IU/L   AST 27 0 - 40 IU/L   ALT 34 0 - 44 IU/L  CBC with Differential  Result Value Ref Range   WBC 6.7 3.4 - 10.8 x10E3/uL   RBC 4.36 4.14 - 5.80 x10E6/uL   Hemoglobin 13.2 13.0 - 17.7 g/dL   Hematocrit 40.2 37.5 - 51.0 %   MCV 92 79 - 97 fL   MCH 30.3 26.6 - 33.0 pg   MCHC 32.8 31.5 - 35.7 g/dL   RDW 13.5 12.3 - 15.4 %   Platelets 248 150 - 379 x10E3/uL   Neutrophils 52 Not Estab. %   Lymphs 32 Not Estab. %   Monocytes 8 Not Estab. %   Eos 8 Not Estab. %   Basos 0 Not Estab. %   Neutrophils Absolute 3.4 1.4 - 7.0 x10E3/uL   Lymphocytes Absolute 2.2 0.7 - 3.1 x10E3/uL   Monocytes Absolute 0.6 0.1 - 0.9 x10E3/uL   EOS (ABSOLUTE) 0.5 (H) 0.0 - 0.4 x10E3/uL   Basophils Absolute 0.0 0.0 - 0.2 x10E3/uL   Immature Granulocytes 0 Not Estab. %   Immature Grans (Abs) 0.0 0.0 - 0.1 x10E3/uL  Bayer DCA Hb A1c Waived (STAT)  Result Value Ref Range   Bayer DCA Hb A1c Waived 6.5 <7.0 %      Assessment & Plan:   Problem List Items Addressed This Visit      Cardiovascular and Mediastinum   Hypertension - Primary    The current medical regimen is effective;  continue present plan and medications.       Relevant Medications   metoprolol succinate (TOPROL-XL) 50 MG 24 hr tablet   lisinopril (PRINIVIL,ZESTRIL) 40 MG tablet   chlorthalidone (HYGROTON) 25 MG tablet   atorvastatin (LIPITOR) 20 MG tablet   amLODipine (NORVASC) 10 MG tablet   Senile purpura (HCC)    stable      Relevant Medications   metoprolol succinate (TOPROL-XL) 50 MG 24 hr tablet   lisinopril (PRINIVIL,ZESTRIL) 40 MG tablet   chlorthalidone (HYGROTON) 25 MG tablet   atorvastatin (LIPITOR) 20 MG tablet   amLODipine (NORVASC) 10 MG tablet   Aortic atherosclerosis (HCC)    stable       Relevant Medications   metoprolol succinate (TOPROL-XL) 50 MG 24 hr tablet   lisinopril (PRINIVIL,ZESTRIL) 40 MG tablet  chlorthalidone (HYGROTON) 25 MG tablet   atorvastatin (LIPITOR) 20 MG tablet   amLODipine (NORVASC) 10 MG tablet     Endocrine   Diabetes mellitus without complication (HCC)    The current medical regimen is effective;  continue present plan and medications.       Relevant Medications   metFORMIN (GLUCOPHAGE-XR) 500 MG 24 hr tablet   lisinopril (PRINIVIL,ZESTRIL) 40 MG tablet   atorvastatin (LIPITOR) 20 MG tablet     Genitourinary   BPH (benign prostatic hyperplasia)    The current medical regimen is effective;  continue present plan and medications.         Other   Hyperlipidemia    The current medical regimen is effective;  continue present plan and medications.       Relevant Medications   metoprolol succinate (TOPROL-XL) 50 MG 24 hr tablet   lisinopril (PRINIVIL,ZESTRIL) 40 MG tablet   chlorthalidone (HYGROTON) 25 MG tablet   atorvastatin (LIPITOR) 20 MG tablet   amLODipine (NORVASC) 10 MG tablet   Advanced care planning/counseling discussion    A voluntary discussion about advance care planning including the explanation and discussion of advance directives was extensively discussed  with the patient.  Explanation about the health care proxy and Living will was reviewed and packet with forms with explanation of how to fill them out was given.  .  Time spent:16+ min       Individuals present. Pt        Other Visit Diagnoses    Elevated TSH       Essential hypertension, benign       Relevant Medications   metoprolol succinate (TOPROL-XL) 50 MG 24 hr tablet   lisinopril (PRINIVIL,ZESTRIL) 40 MG tablet   chlorthalidone (HYGROTON) 25 MG tablet   atorvastatin (LIPITOR) 20 MG tablet   amLODipine (NORVASC) 10 MG tablet       Follow up plan: Return in about 6 months (around 02/10/2018) for Hemoglobin A1c, BMP,  Lipids, ALT, AST.

## 2017-08-17 ENCOUNTER — Encounter: Payer: Self-pay | Admitting: Family Medicine

## 2017-08-17 ENCOUNTER — Encounter: Payer: Self-pay | Admitting: *Deleted

## 2017-08-17 DIAGNOSIS — I359 Nonrheumatic aortic valve disorder, unspecified: Secondary | ICD-10-CM | POA: Insufficient documentation

## 2017-08-17 DIAGNOSIS — J439 Emphysema, unspecified: Secondary | ICD-10-CM | POA: Insufficient documentation

## 2017-09-01 ENCOUNTER — Other Ambulatory Visit: Payer: Self-pay | Admitting: Family Medicine

## 2017-09-01 DIAGNOSIS — E119 Type 2 diabetes mellitus without complications: Secondary | ICD-10-CM

## 2017-09-18 ENCOUNTER — Ambulatory Visit (INDEPENDENT_AMBULATORY_CARE_PROVIDER_SITE_OTHER): Payer: Medicare HMO | Admitting: Vascular Surgery

## 2017-09-18 ENCOUNTER — Encounter (INDEPENDENT_AMBULATORY_CARE_PROVIDER_SITE_OTHER): Payer: Medicare HMO

## 2017-09-23 ENCOUNTER — Encounter (INDEPENDENT_AMBULATORY_CARE_PROVIDER_SITE_OTHER): Payer: Self-pay | Admitting: Vascular Surgery

## 2017-09-23 ENCOUNTER — Ambulatory Visit (INDEPENDENT_AMBULATORY_CARE_PROVIDER_SITE_OTHER): Payer: Medicare HMO | Admitting: Vascular Surgery

## 2017-09-23 ENCOUNTER — Ambulatory Visit (INDEPENDENT_AMBULATORY_CARE_PROVIDER_SITE_OTHER): Payer: Medicare HMO

## 2017-09-23 VITALS — BP 137/68 | HR 50 | Resp 16 | Ht 69.0 in | Wt 178.0 lb

## 2017-09-23 DIAGNOSIS — E119 Type 2 diabetes mellitus without complications: Secondary | ICD-10-CM | POA: Diagnosis not present

## 2017-09-23 DIAGNOSIS — E785 Hyperlipidemia, unspecified: Secondary | ICD-10-CM

## 2017-09-23 DIAGNOSIS — I700 Atherosclerosis of aorta: Secondary | ICD-10-CM | POA: Diagnosis not present

## 2017-09-23 DIAGNOSIS — I739 Peripheral vascular disease, unspecified: Secondary | ICD-10-CM | POA: Insufficient documentation

## 2017-09-23 NOTE — Progress Notes (Signed)
Subjective:    Patient ID: William Ball, male    DOB: 07-19-1945, 72 y.o.   MRN: [id number] Chief Complaint  Patient presents with  . Follow-up    40yr abi   Patient presents for a yearly peripheral artery disease follow-up.  The patient is status post a right CIA/EIA PTA and left CIA/EIA stent on 12/27/2015.  The patient presents today denying any claudication-like symptoms, rest pain or ulceration to the bilateral lower extremity.  The patient underwent a bilateral ABI which was notable for triphasic tibials bilaterally and normal great toe waveforms bilaterally.  Right ABI: 1.15 / Left ABI: 1.12.  No evidence of lower extremity arterial disease noted.  The patient does note some intermittent right lower extremity swelling.  The patient states that he walks on cement floors and stands for long periods of time. The patient notes that towards the end of the day his right lower extremity becomes edematous.  At this time, the patient does not engage in conservative therapy including wearing medical grade 1 compression socks or elevating his legs.  The patient denies any discomfort associated with the edema however does find it edema a nuisance.  Patient denies any fever, nausea vomiting.  Review of Systems  Constitutional: Negative.   HENT: Negative.   Eyes: Negative.   Respiratory: Negative.   Cardiovascular: Positive for leg swelling.       PAD  Gastrointestinal: Negative.   Endocrine: Negative.   Genitourinary: Negative.   Musculoskeletal: Negative.   Skin: Negative.   Allergic/Immunologic: Negative.   Neurological: Negative.   Hematological: Negative.   Psychiatric/Behavioral: Negative.       Objective:   Physical Exam  Constitutional: He is oriented to person, place, and time. He appears well-developed and well-nourished. No distress.  HENT:  Head: Normocephalic and atraumatic.  Right Ear: External ear normal.  Left Ear: External ear normal.  Eyes: Pupils are equal, round, and  reactive to light. Conjunctivae and EOM are normal.  Neck: Normal range of motion.  Cardiovascular: Normal rate, regular rhythm, normal heart sounds and intact distal pulses.  Pulses:      Radial pulses are 2+ on the right side, and 2+ on the left side.       Dorsalis pedis pulses are 2+ on the right side, and 2+ on the left side.       Posterior tibial pulses are 2+ on the right side, and 2+ on the left side.  Pulmonary/Chest: Effort normal and breath sounds normal.  Musculoskeletal: Normal range of motion. He exhibits no edema (No edema noted on today's exam).  Neurological: He is alert and oriented to person, place, and time.  Skin: Skin is warm and dry. He is not diaphoretic.  Psychiatric: He has a normal mood and affect. His behavior is normal. Judgment and thought content normal.  Vitals reviewed.  BP 137/68 (BP Location: Right Arm)   Pulse (!) 50   Resp 16   Ht 5\' 9"  (1.753 m)   Wt 178 lb (80.7 kg)   BMI 26.29 kg/m   Past Medical History:  Diagnosis Date  . Diabetes mellitus without complication (HCC)    borderline  . Hepatomegaly   . Hyperlipidemia   . Hypertension   . Substance abuse ([place])    Social History   Socioeconomic History  . Marital status: Married    Spouse name: Not on file  . Number of children: Not on file  . Years of education: Not on file  .  Highest education level: Not on file  Occupational History  . Not on file  Social Needs  . Financial resource strain: Not hard at all  . Food insecurity:    Worry: Never true    Inability: Never true  . Transportation needs:    Medical: No    Non-medical: No  Tobacco Use  . Smoking status: Current Every Day Smoker    Packs/day: 1.50    Years: 55.00    Pack years: 82.50    Types: Cigarettes  . Smokeless tobacco: Never Used  Substance and Sexual Activity  . Alcohol use: Yes    Alcohol/week: 16.8 oz    Types: 28 Cans of beer per week    Comment: 4+ daily beer  . Drug use: No  . Sexual activity:  Not on file  Lifestyle  . Physical activity:    Days per week: 0 days    Minutes per session: 0 min  . Stress: Not at all  Relationships  . Social connections:    Talks on phone: Never    Gets together: Once a week    Attends religious service: Never    Active member of club or organization: No    Attends meetings of clubs or organizations: Never    Relationship status: Married  . Intimate partner violence:    Fear of current or ex partner: No    Emotionally abused: No    Physically abused: No    Forced sexual activity: No  Other Topics Concern  . Not on file  Social History Narrative   Works 5 days a week 8 hour days    Past Surgical History:  Procedure Laterality Date  . ANGIOPLASTY    . APPENDECTOMY    . BONE MARROW TRANSPLANT Left    ankle.  patient unsure of this but thinks this is what happened  . CARDIAC SURGERY    . CATARACT EXTRACTION W/PHACO Left 11/13/2015   Procedure: CATARACT EXTRACTION PHACO AND INTRAOCULAR LENS PLACEMENT (IOC);  Surgeon: [name], MD;  Location: [facility];  Service: Ophthalmology;  Laterality: Left;  [place] 1.06 AP% 23.9 CDE 15.81 Fluid pack lot # [id number]  . CATARACT EXTRACTION W/PHACO Right 11/27/2015   Procedure: CATARACT EXTRACTION PHACO AND INTRAOCULAR LENS PLACEMENT (IOC);  Surgeon: [name], MD;  Location: [facility];  Service: Ophthalmology;  Laterality: Right;  [place] 1.06 AP% 23.8 CDE 15.80 Fluid pack lot # [id number]  . COLONOSCOPY  2012   polyps, diverticulosis , 9yr repeat  . COLONOSCOPY WITH PROPOFOL N/A 07/17/2016   Procedure: COLONOSCOPY WITH PROPOFOL;  Surgeon: [name], MD;  Location: [facility];  Service: Endoscopy;  Laterality: N/A;  . FRACTURE SURGERY Left 1982   foot, leg.unsure if metal in these parts.possibly replaced ankle  . PERIPHERAL VASCULAR CATHETERIZATION Left 12/27/2015   Procedure: Lower Extremity Angiography;  Surgeon: Algernon Huxley, MD;  Location: [facility];  Service: Cardiovascular;   Laterality: Left;   Family History  Problem Relation Age of Onset  . Cancer Mother   . Heart disease Mother   . Hyperlipidemia Mother   . Heart disease Father   . Hyperlipidemia Father    No Known Allergies     Assessment & Plan:  Patient presents for a yearly peripheral artery disease follow-up.  The patient is status post a right CIA/EIA PTA and left CIA/EIA stent on 12/27/2015.  The patient presents today denying any claudication-like symptoms, rest pain or ulceration to the bilateral lower extremity.  The  patient underwent a bilateral ABI which was notable for triphasic tibials bilaterally and normal great toe waveforms bilaterally.  Right ABI: 1.15 / Left ABI: 1.12.  No evidence of lower extremity arterial disease noted.  The patient does note some intermittent right lower extremity swelling.  The patient states that he walks on cement floors and stands for long periods of time. The patient notes that towards the end of the day his right lower extremity becomes edematous.  At this time, the patient does not engage in conservative therapy including wearing medical grade 1 compression socks or elevating his legs.  The patient denies any discomfort associated with the edema however does find it edema a nuisance.  Patient denies any fever, nausea vomiting.  1. Diabetes mellitus without complication ([place]) - Stable Encouraged good control as its slows the progression of atherosclerotic disease  2. Hyperlipidemia, unspecified hyperlipidemia type - Stable Encouraged good control as its slows the progression of atherosclerotic disease  3. PAD (peripheral artery disease) (HCC) - Stable Patient presents for yearly peripheral artery disease follow-up Presents asymptomatically Unremarkable physical exam Normal ABIs No indication for intervention at this time Patient to follow-up in 1 year for continued surveillance due to stent placement.  - VAS [place] ABI WITH/WO TBI; Future  4. Lower Extremity  Edema - New The patient was encouraged to wear graduated compression stockings (20-30 mmHg) on a daily basis. The patient was instructed to begin wearing the stockings first thing in the morning and removing them in the evening. The patient was instructed specifically not to sleep in the stockings. Prescription given.  In addition, behavioral modification including elevation during the day will be initiated. At this time, the patient is not interested in moving forward with a venous duplex.  If he changes his mind he was advised to call the office and we will be happy to perform this for him. The patient was instructed to call the office in the interim if any worsening edema or ulcerations to the legs, feet or toes occurs. The patient expresses their understanding.  Current Outpatient Medications on File Prior to Visit  Medication Sig Dispense Refill  . amLODipine (NORVASC) 10 MG tablet Take 1 tablet (10 mg total) by mouth daily. 90 tablet 4  . atorvastatin (LIPITOR) 20 MG tablet Take 1 tablet (20 mg total) by mouth daily. 90 tablet 4  . chlorthalidone (HYGROTON) 25 MG tablet Take 1 tablet (25 mg total) by mouth daily. 90 tablet 4  . clopidogrel (PLAVIX) 75 MG tablet Take 1 tablet (75 mg total) by mouth daily. 90 tablet 4  . lisinopril (PRINIVIL,ZESTRIL) 40 MG tablet Take 1 tablet (40 mg total) by mouth daily. 90 tablet 4  . meloxicam (MOBIC) 15 MG tablet TAKE 1 TABLET (15 MG TOTAL) BY MOUTH DAILY AS NEEDED FOR PAIN. 90 tablet 3  . metFORMIN (GLUCOPHAGE-XR) 500 MG 24 hr tablet Take 0.5 tablets (250 mg total) by mouth daily. 45 tablet 4  . metFORMIN (GLUCOPHAGE-XR) 500 MG 24 hr tablet TAKE 0.5 TABLETS (250 MG TOTAL) BY MOUTH DAILY. 45 tablet 4  . metoprolol succinate (TOPROL-XL) 50 MG 24 hr tablet Take 1 tablet (50 mg total) by mouth daily. 90 tablet 4   No current facility-administered medications on file prior to visit.    There are no Patient Instructions on file for this visit. No follow-ups  on file.  Mahlon Gabrielle A Tijuan Dantes, PA-C

## 2017-11-08 ENCOUNTER — Other Ambulatory Visit: Payer: Self-pay | Admitting: Family Medicine

## 2017-11-08 DIAGNOSIS — E119 Type 2 diabetes mellitus without complications: Secondary | ICD-10-CM

## 2017-11-10 NOTE — Telephone Encounter (Signed)
Pt has 2 prescriptions for Metformin HCL 500 mg tab at [id number]. One has come across at a refill (dated 08/11/17) Juluis Rainier

## 2017-11-16 ENCOUNTER — Ambulatory Visit: Payer: Self-pay | Admitting: Family Medicine

## 2017-11-16 NOTE — Telephone Encounter (Signed)
Pt called to report that he has been unable to urinate since yesterday pm. Pt stated that he usually goes without problem but when he goes to urinate he estimated that he is voiding only a tablespoon at a time. After he goes he feels the urge again and voids a tablespoon at a time. Pt states he has been drinking a lot of fluids and he feels it may be his prostate. He stated that he does have burning as the flow of urine stops. Advised pt to go to the ED for evaluation. Unsure pt will go. He stated if he goes it will be to [facility] Pt encouraged to get evaluated. Routing note to office for provider to review.  Reason for Disposition . [1] Unable to urinate (or only a few drops) > 4 hours AND     [2] bladder feels very full (e.g., palpable bladder or strong urge to urinate)  Answer Assessment - Initial Assessment Questions 1. SYMPTOM: "What's the main symptom you're concerned about?" (e.g., frequency, incontinence)     Urinary retention 2. ONSET: "When did the urinary retention  start?"     today 3. PAIN: "Is there any pain?" If so, ask: "How bad is it?" (Scale: 1-10; mild, moderate, severe)     no 4. CAUSE: "What do you think is causing the symptoms?"     Prostate enlargement 5. OTHER SYMPTOMS: "Do you have any other symptoms?" (e.g., fever, flank pain, blood in urine, pain with urination)     Burning with urination when flow is stopped 6. PREGNANCY: "Is there any chance you are pregnant?" "When was your last menstrual period?"     n/a  Protocols used: URINARY [facility]

## 2018-02-16 ENCOUNTER — Ambulatory Visit: Payer: Medicare HMO | Admitting: Family Medicine

## 2018-02-18 ENCOUNTER — Ambulatory Visit (INDEPENDENT_AMBULATORY_CARE_PROVIDER_SITE_OTHER): Payer: Medicare HMO | Admitting: Family Medicine

## 2018-02-18 ENCOUNTER — Encounter: Payer: Self-pay | Admitting: Family Medicine

## 2018-02-18 VITALS — BP 132/70 | HR 56 | Wt 181.0 lb

## 2018-02-18 DIAGNOSIS — Z23 Encounter for immunization: Secondary | ICD-10-CM

## 2018-02-18 DIAGNOSIS — N183 Chronic kidney disease, stage 3 unspecified: Secondary | ICD-10-CM

## 2018-02-18 DIAGNOSIS — I10 Essential (primary) hypertension: Secondary | ICD-10-CM | POA: Diagnosis not present

## 2018-02-18 DIAGNOSIS — J439 Emphysema, unspecified: Secondary | ICD-10-CM

## 2018-02-18 DIAGNOSIS — E119 Type 2 diabetes mellitus without complications: Secondary | ICD-10-CM | POA: Diagnosis not present

## 2018-02-18 DIAGNOSIS — E785 Hyperlipidemia, unspecified: Secondary | ICD-10-CM | POA: Diagnosis not present

## 2018-02-18 DIAGNOSIS — I129 Hypertensive chronic kidney disease with stage 1 through stage 4 chronic kidney disease, or unspecified chronic kidney disease: Secondary | ICD-10-CM

## 2018-02-18 LAB — LP+ALT+AST PICCOLO, WAIVED
ALT (SGPT) Piccolo, Waived: 44 U/L (ref 10–47)
AST (SGOT) Piccolo, Waived: 36 U/L (ref 11–38)
Cholesterol Piccolo, Waived: 117 mg/dL
Triglycerides Piccolo,Waived: 477 mg/dL — ABNORMAL HIGH

## 2018-02-18 NOTE — Assessment & Plan Note (Signed)
Stable checking bmp today

## 2018-02-18 NOTE — Progress Notes (Signed)
BP 132/70 (BP Location: Left Arm)   Pulse (!) 56   Wt 181 lb (82.1 kg)   SpO2 97%   BMI 26.73 kg/m    Subjective:    Patient ID: William Ball, male    DOB: 14-Apr-1946, 72 y.o.   MRN: [id number]  HPI: William Ball is a 72 y.o. male  Chief Complaint  Patient presents with  . Follow-up  . Hypertension  . Hyperlipidemia  . Diabetes   Patient all in all doing well no complaints taking blood pressure medicines without problems and good control cholesterol no issues or concerns. Diabetes seemingly good control without problems or issues taking metformin 250 a day without problems.  Relevant past medical, surgical, family and social history reviewed and updated as indicated. Interim medical history since our last visit reviewed. Allergies and medications reviewed and updated.  Review of Systems  Constitutional: Negative.   Respiratory: Negative.   Cardiovascular: Negative.     Per HPI unless specifically indicated above     Objective:    BP 132/70 (BP Location: Left Arm)   Pulse (!) 56   Wt 181 lb (82.1 kg)   SpO2 97%   BMI 26.73 kg/m   Wt Readings from Last 3 Encounters:  02/18/18 181 lb (82.1 kg)  09/23/17 178 lb (80.7 kg)  08/11/17 177 lb 14.4 oz (80.7 kg)    Physical Exam  Constitutional: He is oriented to person, place, and time. He appears well-developed and well-nourished.  HENT:  Head: Normocephalic and atraumatic.  Eyes: Conjunctivae and EOM are normal.  Neck: Normal range of motion.  Cardiovascular: Normal rate, regular rhythm and normal heart sounds.  Pulmonary/Chest: Effort normal and breath sounds normal.  Musculoskeletal: Normal range of motion.  Neurological: He is alert and oriented to person, place, and time.  Skin: No erythema.  Psychiatric: He has a normal mood and affect. His behavior is normal. Judgment and thought content normal.    Results for orders placed or performed in visit on 08/06/17  Urinalysis, Routine w reflex microscopic    Result Value Ref Range   Specific Gravity, UA 1.020 1.005 - 1.030   pH, UA 5.0 5.0 - 7.5   Color, UA Yellow Yellow   Appearance Ur Clear Clear   Leukocytes, UA Negative Negative   Protein, UA Negative Negative/Trace   Glucose, UA Negative Negative   Ketones, UA Negative Negative   RBC, UA Negative Negative   Bilirubin, UA Negative Negative   Urobilinogen, Ur 0.2 0.2 - 1.0 mg/dL   Nitrite, UA Negative Negative  TSH  Result Value Ref Range   TSH 2.640 0.450 - 4.500 uIU/mL  PSA  Result Value Ref Range   Prostate Specific Ag, Serum 0.7 0.0 - 4.0 ng/mL  Lipid Panel w/o Chol/HDL Ratio  Result Value Ref Range   Cholesterol, Total 112 100 - 199 mg/dL   Triglycerides 167 (H) 0 - 149 mg/dL   HDL 37 (L) >39 mg/dL   VLDL Cholesterol Cal 33 5 - 40 mg/dL   LDL Calculated 42 0 - 99 mg/dL  Comp Met (CMET)  Result Value Ref Range   Glucose 98 65 - 99 mg/dL   BUN 35 (H) 8 - 27 mg/dL   Creatinine, Ser 1.44 (H) 0.76 - 1.27 mg/dL   GFR calc non Af Amer 48 (L) >59 mL/min/1.73   GFR calc Af Amer 56 (L) >59 mL/min/1.73   BUN/Creatinine Ratio 24 10 - 24   Sodium 143 134 - 144  mmol/L   Potassium 4.8 3.5 - 5.2 mmol/L   Chloride 109 (H) 96 - 106 mmol/L   CO2 17 (L) 20 - 29 mmol/L   Calcium 9.6 8.6 - 10.2 mg/dL   Total Protein 6.9 6.0 - 8.5 g/dL   Albumin 4.7 3.5 - 4.8 g/dL   Globulin, Total 2.2 1.5 - 4.5 g/dL   Albumin/Globulin Ratio 2.1 1.2 - 2.2   Bilirubin Total 0.3 0.0 - 1.2 mg/dL   Alkaline Phosphatase 87 39 - 117 IU/L   AST 27 0 - 40 IU/L   ALT 34 0 - 44 IU/L  CBC with Differential  Result Value Ref Range   WBC 6.7 3.4 - 10.8 x10E3/uL   RBC 4.36 4.14 - 5.80 x10E6/uL   Hemoglobin 13.2 13.0 - 17.7 g/dL   Hematocrit 40.2 37.5 - 51.0 %   MCV 92 79 - 97 fL   MCH 30.3 26.6 - 33.0 pg   MCHC 32.8 31.5 - 35.7 g/dL   RDW 13.5 12.3 - 15.4 %   Platelets 248 150 - 379 x10E3/uL   Neutrophils 52 Not Estab. %   Lymphs 32 Not Estab. %   Monocytes 8 Not Estab. %   Eos 8 Not Estab. %   Basos 0  Not Estab. %   Neutrophils Absolute 3.4 1.4 - 7.0 x10E3/uL   Lymphocytes Absolute 2.2 0.7 - 3.1 x10E3/uL   Monocytes Absolute 0.6 0.1 - 0.9 x10E3/uL   EOS (ABSOLUTE) 0.5 (H) 0.0 - 0.4 x10E3/uL   Basophils Absolute 0.0 0.0 - 0.2 x10E3/uL   Immature Granulocytes 0 Not Estab. %   Immature Grans (Abs) 0.0 0.0 - 0.1 x10E3/uL  Bayer DCA Hb A1c Waived (STAT)  Result Value Ref Range   HB A1C (BAYER DCA - WAIVED) 6.5 <7.0 %      Assessment & Plan:   Problem List Items Addressed This Visit      Respiratory   Emphysema lung ([place])    Stable Stop smoking        Endocrine   Diabetes mellitus without complication ([place])    The current medical regimen is effective;  continue present plan and medications.       Relevant Orders   Basic metabolic panel   Bayer DCA Hb A1c Waived   LP+ALT+AST Piccolo, Waived     Genitourinary   Hypertensive nephropathy - Primary    The current medical regimen is effective;  continue present plan and medications.       CKD (chronic kidney disease) stage 3, GFR 30-59 ml/min (HCC)    Stable checking bmp today        Other   Hyperlipidemia    Reviewed elevated triglycerides encouraged to start taking fish oil at least 3 a day.  Patient will start.      Relevant Orders   Basic metabolic panel   Bayer DCA Hb A1c Waived   LP+ALT+AST Piccolo, [place]    Other Visit Diagnoses    Needs flu shot       Relevant Orders   Flu vaccine HIGH DOSE PF (Fluzone High dose) (Completed)       Follow up plan: Return in about 6 months (around 08/20/2018) for Physical Exam, Hemoglobin A1c.

## 2018-02-18 NOTE — Assessment & Plan Note (Signed)
Reviewed elevated triglycerides encouraged to start taking fish oil at least 3 a day.  Patient will start.

## 2018-02-18 NOTE — Assessment & Plan Note (Signed)
Stable Stop smoking

## 2018-02-18 NOTE — Assessment & Plan Note (Signed)
The current medical regimen is effective;  continue present plan and medications.  

## 2018-02-19 LAB — BASIC METABOLIC PANEL WITH GFR
BUN/Creatinine Ratio: 23 (ref 10–24)
BUN: 29 mg/dL — ABNORMAL HIGH (ref 8–27)
CO2: 19 mmol/L — ABNORMAL LOW (ref 20–29)
Calcium: 9.9 mg/dL (ref 8.6–10.2)
Chloride: 101 mmol/L (ref 96–106)
Creatinine, Ser: 1.27 mg/dL (ref 0.76–1.27)
GFR calc Af Amer: 64 mL/min/1.73
GFR calc non Af Amer: 56 mL/min/1.73 — ABNORMAL LOW
Glucose: 138 mg/dL — ABNORMAL HIGH (ref 65–99)
Potassium: 4.4 mmol/L (ref 3.5–5.2)
Sodium: 138 mmol/L (ref 134–144)

## 2018-02-22 ENCOUNTER — Encounter: Payer: Self-pay | Admitting: Family Medicine

## 2018-02-25 LAB — BAYER DCA HB A1C WAIVED: HB A1C (BAYER DCA - WAIVED): 6.9 %

## 2018-05-17 DIAGNOSIS — B301 Conjunctivitis due to adenovirus: Secondary | ICD-10-CM | POA: Diagnosis not present

## 2018-06-12 ENCOUNTER — Other Ambulatory Visit: Payer: Self-pay | Admitting: Family Medicine

## 2018-06-12 DIAGNOSIS — M1610 Unilateral primary osteoarthritis, unspecified hip: Secondary | ICD-10-CM

## 2018-06-24 DIAGNOSIS — H0101A Ulcerative blepharitis right eye, upper and lower eyelids: Secondary | ICD-10-CM | POA: Diagnosis not present

## 2018-06-30 DIAGNOSIS — H0101A Ulcerative blepharitis right eye, upper and lower eyelids: Secondary | ICD-10-CM | POA: Diagnosis not present

## 2018-07-22 ENCOUNTER — Encounter: Payer: Self-pay | Admitting: *Deleted

## 2018-07-25 ENCOUNTER — Encounter: Payer: Self-pay | Admitting: *Deleted

## 2018-08-09 ENCOUNTER — Ambulatory Visit: Payer: Self-pay

## 2018-08-22 ENCOUNTER — Other Ambulatory Visit: Payer: Self-pay | Admitting: Family Medicine

## 2018-08-22 DIAGNOSIS — I10 Essential (primary) hypertension: Secondary | ICD-10-CM

## 2018-08-25 ENCOUNTER — Encounter: Payer: Medicare HMO | Admitting: Family Medicine

## 2018-09-09 ENCOUNTER — Other Ambulatory Visit: Payer: Self-pay | Admitting: Family Medicine

## 2018-09-13 ENCOUNTER — Other Ambulatory Visit: Payer: Self-pay | Admitting: Family Medicine

## 2018-09-13 DIAGNOSIS — E119 Type 2 diabetes mellitus without complications: Secondary | ICD-10-CM

## 2018-09-13 NOTE — Telephone Encounter (Signed)
Requested medication (s) are due for refill today -yes  Requested medication (s) are on the active medication list- yes  Future visit scheduled- yes  Last refill: 08/11/17  Notes to clinic: Patient is requesting a medication that requires 6 month follow up- he does not meet critria- but he does have an upcoming appointment in July.  Requested Prescriptions  Pending Prescriptions Disp Refills   metFORMIN (GLUCOPHAGE-XR) 500 MG 24 hr tablet [Pharmacy Med Name: METFORMIN HCL ER 500 MG TABLET] 135 tablet 1    Sig: Take 0.5 tablets (250 mg total) by mouth daily.     Endocrinology:  Diabetes - Biguanides Failed - 09/13/2018  3:10 PM      Failed - HBA1C is between 0 and 7.9 and within 180 days    Hemoglobin A1C  Date Value Ref Range Status  12/05/2015 6.7  Final   HB A1C (BAYER DCA - WAIVED)  Date Value Ref Range Status  02/18/2018 6.9 <7.0 % Final    Comment:                                          Diabetic Adult            <7.0                                       Healthy Adult        4.3 - 5.7                                                           (DCCT/NGSP) American Diabetes Association's Summary of Glycemic Recommendations for Adults with Diabetes: Hemoglobin A1c <7.0%. More stringent glycemic goals (A1c <6.0%) may further reduce complications at the cost of increased risk of hypoglycemia.          Failed - Valid encounter within last 6 months    Recent Outpatient Visits          6 months ago Essential hypertension   [place] Crissman, Jeannette How, MD   1 year ago Hypertension, unspecified type   [facility] Crissman, Jeannette How, MD   1 year ago Essential hypertension   [place] Crissman, Jeannette How, MD   1 year ago Essential hypertension   [place] Crissman, Jeannette How, MD   1 year ago Diabetes mellitus without complication [facility])   [place], MD      Future Appointments            In 2  months  [place]   In 2 months Crissman, Jeannette How, MD [place], [facility]           Passed - Cr in normal range and within 360 days    Creatinine, Ser  Date Value Ref Range Status  02/18/2018 1.27 0.76 - 1.27 mg/dL Final         Passed - eGFR in normal range and within 360 days    GFR calc Af Amer  Date Value Ref Range Status  02/18/2018 64 >59 mL/min/1.73 Final  GFR calc non Af Amer  Date Value Ref Range Status  02/18/2018 56 (L) >59 mL/min/1.73 Final          Requested Prescriptions  Pending Prescriptions Disp Refills   metFORMIN (GLUCOPHAGE-XR) 500 MG 24 hr tablet [Pharmacy Med Name: METFORMIN HCL ER 500 MG TABLET] 135 tablet 1    Sig: Take 0.5 tablets (250 mg total) by mouth daily.     Endocrinology:  Diabetes - Biguanides Failed - 09/13/2018  3:10 PM      Failed - HBA1C is between 0 and 7.9 and within 180 days    Hemoglobin A1C  Date Value Ref Range Status  12/05/2015 6.7  Final   HB A1C (BAYER DCA - WAIVED)  Date Value Ref Range Status  02/18/2018 6.9 <7.0 % Final    Comment:                                          Diabetic Adult            <7.0                                       Healthy Adult        4.3 - 5.7                                                           (DCCT/NGSP) American Diabetes Association's Summary of Glycemic Recommendations for Adults with Diabetes: Hemoglobin A1c <7.0%. More stringent glycemic goals (A1c <6.0%) may further reduce complications at the cost of increased risk of hypoglycemia.          Failed - Valid encounter within last 6 months    Recent Outpatient Visits          6 months ago Essential hypertension   [place] Crissman, Jeannette How, MD   1 year ago Hypertension, unspecified type   [facility] Crissman, Jeannette How, MD   1 year ago Essential hypertension   [place] Crissman, Jeannette How, MD   1 year ago Essential hypertension   [place] Crissman, Jeannette How, MD   1 year ago Diabetes mellitus without complication [facility])   [place] Crissman, Jeannette How, MD      Future Appointments            In 2 months  [place], [place]   In 2 months Crissman, Jeannette How, MD [place], [facility]           Passed - Cr in normal range and within 360 days    Creatinine, Ser  Date Value Ref Range Status  02/18/2018 1.27 0.76 - 1.27 mg/dL Final         Passed - eGFR in normal range and within 360 days    GFR calc Af Amer  Date Value Ref Range Status  02/18/2018 64 >59 mL/min/1.73 Final   GFR calc non Af Amer  Date Value Ref Range Status  02/18/2018 56 (L) >59 mL/min/1.73 Final

## 2018-09-14 ENCOUNTER — Other Ambulatory Visit: Payer: Self-pay | Admitting: Family Medicine

## 2018-09-14 DIAGNOSIS — E785 Hyperlipidemia, unspecified: Secondary | ICD-10-CM

## 2018-09-24 ENCOUNTER — Other Ambulatory Visit: Payer: Self-pay

## 2018-09-24 ENCOUNTER — Ambulatory Visit (INDEPENDENT_AMBULATORY_CARE_PROVIDER_SITE_OTHER): Payer: Medicare HMO | Admitting: Vascular Surgery

## 2018-09-24 ENCOUNTER — Encounter (INDEPENDENT_AMBULATORY_CARE_PROVIDER_SITE_OTHER): Payer: Self-pay | Admitting: Vascular Surgery

## 2018-09-24 ENCOUNTER — Ambulatory Visit (INDEPENDENT_AMBULATORY_CARE_PROVIDER_SITE_OTHER): Payer: Medicare HMO

## 2018-09-24 VITALS — BP 156/70 | HR 52 | Resp 10 | Ht 72.0 in | Wt 188.0 lb

## 2018-09-24 DIAGNOSIS — E785 Hyperlipidemia, unspecified: Secondary | ICD-10-CM | POA: Diagnosis not present

## 2018-09-24 DIAGNOSIS — I739 Peripheral vascular disease, unspecified: Secondary | ICD-10-CM

## 2018-09-24 DIAGNOSIS — Z7984 Long term (current) use of oral hypoglycemic drugs: Secondary | ICD-10-CM | POA: Diagnosis not present

## 2018-09-24 DIAGNOSIS — E1151 Type 2 diabetes mellitus with diabetic peripheral angiopathy without gangrene: Secondary | ICD-10-CM | POA: Diagnosis not present

## 2018-09-24 DIAGNOSIS — E119 Type 2 diabetes mellitus without complications: Secondary | ICD-10-CM

## 2018-09-24 NOTE — Assessment & Plan Note (Signed)
His ABIs today are 1.12 bilaterally with triphasic waveforms consistent with no arterial insufficiency and patent previous interventions. We will continue the current medical regimen.  Recheck in 1 year.

## 2018-09-24 NOTE — Patient Instructions (Signed)

## 2018-09-24 NOTE — Assessment & Plan Note (Signed)
blood glucose control important in reducing the progression of atherosclerotic disease. Also, involved in wound healing. On appropriate medications.  

## 2018-09-24 NOTE — Progress Notes (Signed)
MRN : [id number]  William Ball is a 73 y.o. (1945-11-16) male who presents with chief complaint of  Chief Complaint  Patient presents with  . Follow-up  .  History of Present Illness: Patient returns today in follow up of his peripheral arterial disease.  He is almost 3 years status post aortoiliac intervention for claudication symptoms.  Since that time, he has done well from his PAD standpoint.  No recurrent claudication, ischemic rest pain, or ulceration that is concerning.  His ABIs today are 1.12 bilaterally with triphasic waveforms consistent with no arterial insufficiency and patent previous interventions.  Current Outpatient Medications  Medication Sig Dispense Refill  . amLODipine (NORVASC) 10 MG tablet Take 1 tablet (10 mg total) by mouth daily. 90 tablet 4  . atorvastatin (LIPITOR) 20 MG tablet TAKE 1 TABLET BY MOUTH DAILY 90 tablet 1  . chlorthalidone (HYGROTON) 25 MG tablet Take 1 tablet (25 mg total) by mouth daily. 90 tablet 4  . clopidogrel (PLAVIX) 75 MG tablet TAKE 1 TABLET BY MOUTH EVERY DAY 30 tablet 0  . lisinopril (ZESTRIL) 40 MG tablet TAKE 1 TABLET BY MOUTH EVERY DAY 90 tablet 0  . meloxicam (MOBIC) 15 MG tablet TAKE 1 TABLET (15 MG TOTAL) BY MOUTH DAILY AS NEEDED FOR PAIN. 90 tablet 3  . metFORMIN (GLUCOPHAGE-XR) 500 MG 24 hr tablet TAKE 0.5 TABLETS (250 MG TOTAL) BY MOUTH DAILY. 45 tablet 4  . metoprolol succinate (TOPROL-XL) 50 MG 24 hr tablet Take 1 tablet (50 mg total) by mouth daily. 90 tablet 4  . metFORMIN (GLUCOPHAGE-XR) 500 MG 24 hr tablet TAKE 0.5 TABLETS (250 MG TOTAL) BY MOUTH DAILY. 135 tablet 1   No current facility-administered medications for this visit.     Past Medical History:  Diagnosis Date  . Diabetes mellitus without complication (HCC)    borderline  . Hepatomegaly   . Hyperlipidemia   . Hypertension   . Substance abuse [facility])     Past Surgical History:  Procedure Laterality Date  . ANGIOPLASTY    . APPENDECTOMY    . BONE  MARROW TRANSPLANT Left    ankle.  patient unsure of this but thinks this is what happened  . CARDIAC SURGERY    . CATARACT EXTRACTION W/PHACO Left 11/13/2015   Procedure: CATARACT EXTRACTION PHACO AND INTRAOCULAR LENS PLACEMENT (IOC);  Surgeon: [name], MD;  Location: [facility] ORS;  Service: Ophthalmology;  Laterality: Left;  [place] 1.06 AP% 23.9 CDE 15.81 Fluid pack lot # [id number]  . CATARACT EXTRACTION W/PHACO Right 11/27/2015   Procedure: CATARACT EXTRACTION PHACO AND INTRAOCULAR LENS PLACEMENT (IOC);  Surgeon: [name], MD;  Location: [facility] ORS;  Service: Ophthalmology;  Laterality: Right;  [place] 1.06 AP% 23.8 CDE 15.80 Fluid pack lot # [id number]  . COLONOSCOPY  2012   polyps, diverticulosis , 69yr repeat  . COLONOSCOPY WITH PROPOFOL N/A 07/17/2016   Procedure: COLONOSCOPY WITH PROPOFOL;  Surgeon: [name], MD;  Location: [facility] ENDOSCOPY;  Service: Endoscopy;  Laterality: N/A;  . FRACTURE SURGERY Left 1982   foot, leg.unsure if metal in these parts.possibly replaced ankle  . PERIPHERAL VASCULAR CATHETERIZATION Left 12/27/2015   Procedure: Lower Extremity Angiography;  Surgeon: Algernon Huxley, MD;  Location: [place] CV LAB;  Service: Cardiovascular;  Laterality: Left;    Social History Social History   Tobacco Use  . Smoking status: Current Every Day Smoker    Packs/day: 1.50    Years: 55.00    Pack years: 82.50  Types: Cigarettes  . Smokeless tobacco: Never Used  Substance Use Topics  . Alcohol use: Yes    Alcohol/week: 28.0 standard drinks    Types: 28 Cans of beer per week    Comment: 4+ daily beer  . Drug use: No    Family History Family History  Problem Relation Age of Onset  . Cancer Mother   . Heart disease Mother   . Hyperlipidemia Mother   . Heart disease Father   . Hyperlipidemia Father     No Known Allergies   REVIEW OF SYSTEMS (Negative unless checked)  Constitutional: [] Weight loss  [] Fever  [] Chills Cardiac: [] Chest pain   [] Chest  pressure   [] Palpitations   [] Shortness of breath when laying flat   [] Shortness of breath at rest   [] Shortness of breath with exertion. Vascular:  [] Pain in legs with walking   [] Pain in legs at rest   [] Pain in legs when laying flat   [] Claudication   [] Pain in feet when walking  [] Pain in feet at rest  [] Pain in feet when laying flat   [] History of DVT   [] Phlebitis   [] Swelling in legs   [] Varicose veins   [] Non-healing ulcers Pulmonary:   [] Uses home oxygen   [] Productive cough   [] Hemoptysis   [] Wheeze  [] COPD   [] Asthma Neurologic:  [] Dizziness  [] Blackouts   [] Seizures   [] History of stroke   [] History of TIA  [] Aphasia   [] Temporary blindness   [] Dysphagia   [] Weakness or numbness in arms   [] Weakness or numbness in legs Musculoskeletal:  [x] Arthritis   [] Joint swelling   [] Joint pain   [] Low back pain Hematologic:  [] Easy bruising  [] Easy bleeding   [] Hypercoagulable state   [] Anemic   Gastrointestinal:  [] Blood in stool   [] Vomiting blood  [x] Gastroesophageal reflux/heartburn   [] Abdominal pain Genitourinary:  [] Chronic kidney disease   [] Difficult urination  [] Frequent urination  [] Burning with urination   [] Hematuria Skin:  [] Rashes   [] Ulcers   [] Wounds Psychological:  [] History of anxiety   []  History of major depression.  Physical Examination  BP (!) 156/70 (BP Location: Left Arm, Patient Position: Sitting, Cuff Size: Large)   Pulse (!) 52   Resp 10   Ht 6' (1.829 m)   Wt 188 lb (85.3 kg)   BMI 25.50 kg/m  Gen:  WD/WN, NAD Head: [place]/AT, No temporalis wasting. Ear/Nose/Throat: Hearing grossly intact, nares w/o erythema or drainage Eyes: Conjunctiva clear. Sclera non-icteric Neck: Supple.  Trachea midline Pulmonary:  Good air movement, no use of accessory muscles.  Cardiac: RRR, no JVD Vascular:  Vessel Right Left  Radial Palpable Palpable                          PT Palpable Palpable  DP Palpable Palpable    Musculoskeletal: M/S 5/5 throughout.  No deformity or  atrophy.  No edema. Neurologic: Sensation grossly intact in extremities.  Symmetrical.  Speech is fluent.  Psychiatric: Judgment intact, Mood & affect appropriate for pt's clinical situation. Dermatologic: No rashes or ulcers noted.  No cellulitis or open wounds.       Labs No results found for this or any previous visit (from the past 2160 hour(s)).  Radiology No results found.  Assessment/Plan  Diabetes mellitus without complication blood glucose control important in reducing the progression of atherosclerotic disease. Also, involved in wound healing. On appropriate medications.   Hyperlipidemia lipid control important in reducing the progression  of atherosclerotic disease. Continue statin therapy   PAD (peripheral artery disease) (HCC) His ABIs today are 1.12 bilaterally with triphasic waveforms consistent with no arterial insufficiency and patent previous interventions. We will continue the current medical regimen.  Recheck in 1 year.    Leotis Pain, MD  09/24/2018 11:12 AM    This note was created with Dragon medical transcription system.  Any errors from dictation are purely unintentional

## 2018-09-24 NOTE — Assessment & Plan Note (Signed)
lipid control important in reducing the progression of atherosclerotic disease. Continue statin therapy  

## 2018-09-28 ENCOUNTER — Other Ambulatory Visit: Payer: Self-pay | Admitting: Family Medicine

## 2018-09-28 DIAGNOSIS — I10 Essential (primary) hypertension: Secondary | ICD-10-CM

## 2018-09-29 DIAGNOSIS — E119 Type 2 diabetes mellitus without complications: Secondary | ICD-10-CM | POA: Diagnosis not present

## 2018-09-29 LAB — HM DIABETES EYE EXAM

## 2018-10-01 ENCOUNTER — Other Ambulatory Visit: Payer: Self-pay | Admitting: Family Medicine

## 2018-10-01 NOTE — Telephone Encounter (Signed)
Requested medication (s) are due for refill today: yes  Requested medication (s) are on the active medication list: yes  Last refill: 09/09/18  Future visit scheduled: yes  Notes to clinic: Needs labs    Requested Prescriptions  Pending Prescriptions Disp Refills   clopidogrel (PLAVIX) 75 MG tablet [Pharmacy Med Name: CLOPIDOGREL 75 MG TABLET] 30 tablet 0    Sig: TAKE 1 TABLET BY [place]     Hematology: Antiplatelets - clopidogrel Failed - 10/01/2018  1:31 PM      Failed - Evaluate AST, ALT within 2 months of therapy initiation.      Failed - AST in normal range and within 360 days    AST (SGOT) Piccolo, Waived  Date Value Ref Range Status  02/18/2018 36 11 - 38 U/L Final         Failed - HCT in normal range and within 180 days    Hematocrit  Date Value Ref Range Status  08/06/2017 40.2 37.5 - 51.0 % Final         Failed - HGB in normal range and within 180 days    Hemoglobin  Date Value Ref Range Status  08/06/2017 13.2 13.0 - 17.7 g/dL Final         Failed - PLT in normal range and within 180 days    Platelets  Date Value Ref Range Status  08/06/2017 248 150 - 379 x10E3/uL Final         Failed - Valid encounter within last 6 months    Recent Outpatient Visits          7 months ago Essential hypertension   [place] Crissman, Jeannette How, MD   1 year ago Hypertension, unspecified type   [facility] Crissman, Jeannette How, MD   1 year ago Essential hypertension   [place] Crissman, Jeannette How, MD   1 year ago Essential hypertension   [facility] Crissman, Jeannette How, MD   1 year ago Diabetes mellitus without complication [facility])   [place], Jeannette How, MD      Future Appointments            In 1 month  [place]   In 1 month Crissman, Jeannette How, MD [place], [facility]           Passed - ALT in normal range and within 360 days    ALT (SGPT) Piccolo, Waived   Date Value Ref Range Status  02/18/2018 44 10 - 47 U/L Final

## 2018-10-04 ENCOUNTER — Telehealth: Payer: Self-pay | Admitting: *Deleted

## 2018-10-04 DIAGNOSIS — Z87891 Personal history of nicotine dependence: Secondary | ICD-10-CM

## 2018-10-04 DIAGNOSIS — Z122 Encounter for screening for malignant neoplasm of respiratory organs: Secondary | ICD-10-CM

## 2018-10-04 NOTE — Telephone Encounter (Signed)
Patient has been notified that annual lung cancer screening low dose CT scan is due currently or will be in near future. Confirmed that patient is within the age range of 55-77, and asymptomatic, (no signs or symptoms of lung cancer). Patient denies illness that would prevent curative treatment for lung cancer if found. Verified smoking history, (current, 86.5 pack year). The shared decision making visit was done 06/25/16. Patient is agreeable for CT scan being scheduled.

## 2018-10-08 ENCOUNTER — Other Ambulatory Visit: Payer: Self-pay

## 2018-10-08 ENCOUNTER — Ambulatory Visit
Admission: RE | Admit: 2018-10-08 | Discharge: 2018-10-08 | Disposition: A | Discharge status: Home / Self Care | Payer: Medicare HMO | Source: Ambulatory Visit | Attending: Oncology | Admitting: Oncology

## 2018-10-08 DIAGNOSIS — Z87891 Personal history of nicotine dependence: Secondary | ICD-10-CM | POA: Insufficient documentation

## 2018-10-08 DIAGNOSIS — Z122 Encounter for screening for malignant neoplasm of respiratory organs: Secondary | ICD-10-CM | POA: Insufficient documentation

## 2018-10-13 ENCOUNTER — Encounter: Payer: Self-pay | Admitting: *Deleted

## 2018-10-14 ENCOUNTER — Inpatient Hospital Stay: Admission: RE | Admit: 2018-10-14 | Payer: Medicare HMO | Source: Ambulatory Visit

## 2018-11-03 ENCOUNTER — Other Ambulatory Visit: Payer: Self-pay | Admitting: Family Medicine

## 2018-11-03 DIAGNOSIS — I10 Essential (primary) hypertension: Secondary | ICD-10-CM

## 2018-11-05 ENCOUNTER — Other Ambulatory Visit: Payer: Self-pay | Admitting: Family Medicine

## 2018-11-05 DIAGNOSIS — I10 Essential (primary) hypertension: Secondary | ICD-10-CM

## 2018-11-05 NOTE — Telephone Encounter (Signed)
Forwarding medication refill to PCP for review. 

## 2018-11-19 ENCOUNTER — Other Ambulatory Visit: Payer: Self-pay | Admitting: Family Medicine

## 2018-11-19 DIAGNOSIS — I10 Essential (primary) hypertension: Secondary | ICD-10-CM

## 2018-11-19 NOTE — Telephone Encounter (Signed)
Forwarding medication refill to PCP for review. 

## 2018-11-24 ENCOUNTER — Other Ambulatory Visit: Payer: Self-pay

## 2018-11-24 ENCOUNTER — Encounter: Payer: Self-pay | Admitting: Family Medicine

## 2018-11-24 ENCOUNTER — Ambulatory Visit (INDEPENDENT_AMBULATORY_CARE_PROVIDER_SITE_OTHER): Payer: Medicare HMO

## 2018-11-24 ENCOUNTER — Ambulatory Visit (INDEPENDENT_AMBULATORY_CARE_PROVIDER_SITE_OTHER): Payer: Medicare HMO | Admitting: Family Medicine

## 2018-11-24 VITALS — BP 138/62 | HR 57 | Temp 98.2°F | Resp 17 | Ht 69.5 in | Wt 188.7 lb

## 2018-11-24 DIAGNOSIS — I700 Atherosclerosis of aorta: Secondary | ICD-10-CM

## 2018-11-24 DIAGNOSIS — R7989 Other specified abnormal findings of blood chemistry: Secondary | ICD-10-CM

## 2018-11-24 DIAGNOSIS — E1122 Type 2 diabetes mellitus with diabetic chronic kidney disease: Secondary | ICD-10-CM

## 2018-11-24 DIAGNOSIS — E119 Type 2 diabetes mellitus without complications: Secondary | ICD-10-CM

## 2018-11-24 DIAGNOSIS — I739 Peripheral vascular disease, unspecified: Secondary | ICD-10-CM

## 2018-11-24 DIAGNOSIS — E785 Hyperlipidemia, unspecified: Secondary | ICD-10-CM | POA: Diagnosis not present

## 2018-11-24 DIAGNOSIS — Z7189 Other specified counseling: Secondary | ICD-10-CM | POA: Diagnosis not present

## 2018-11-24 DIAGNOSIS — Z0000 Encounter for general adult medical examination without abnormal findings: Secondary | ICD-10-CM

## 2018-11-24 DIAGNOSIS — I10 Essential (primary) hypertension: Secondary | ICD-10-CM

## 2018-11-24 DIAGNOSIS — N183 Chronic kidney disease, stage 3 unspecified: Secondary | ICD-10-CM

## 2018-11-24 DIAGNOSIS — N400 Enlarged prostate without lower urinary tract symptoms: Secondary | ICD-10-CM

## 2018-11-24 DIAGNOSIS — I129 Hypertensive chronic kidney disease with stage 1 through stage 4 chronic kidney disease, or unspecified chronic kidney disease: Secondary | ICD-10-CM

## 2018-11-24 DIAGNOSIS — M1610 Unilateral primary osteoarthritis, unspecified hip: Secondary | ICD-10-CM

## 2018-11-24 DIAGNOSIS — D692 Other nonthrombocytopenic purpura: Secondary | ICD-10-CM | POA: Diagnosis not present

## 2018-11-24 LAB — URINALYSIS, ROUTINE W REFLEX MICROSCOPIC
Bilirubin, UA: NEGATIVE
Glucose, UA: NEGATIVE
Ketones, UA: NEGATIVE
Leukocytes,UA: NEGATIVE
Nitrite, UA: NEGATIVE
Protein,UA: NEGATIVE
RBC, UA: NEGATIVE
Specific Gravity, UA: <1.005 — ABNORMAL LOW (ref 1.005–1.030)
Urobilinogen, Ur: 0.2 mg/dL (ref 0.2–1.0)
pH, UA: 5 (ref 5.0–7.5)

## 2018-11-24 LAB — BAYER DCA HB A1C WAIVED: HB A1C (BAYER DCA - WAIVED): 7.8 % — ABNORMAL HIGH

## 2018-11-24 MED ORDER — METOPROLOL SUCCINATE ER 50 MG PO TB24: 50.0000 mg | ORAL_TABLET | Freq: Every day | ORAL | 4 refills | Status: DC

## 2018-11-24 MED ORDER — LISINOPRIL 40 MG PO TABS: 40.0000 mg | ORAL_TABLET | Freq: Every day | ORAL | 4 refills | Status: DC

## 2018-11-24 MED ORDER — CLOPIDOGREL BISULFATE 75 MG PO TABS: 75.0000 mg | ORAL_TABLET | Freq: Every day | ORAL | 4 refills | Status: DC

## 2018-11-24 MED ORDER — METFORMIN HCL ER 500 MG PO TB24: 500.0000 mg | ORAL_TABLET | Freq: Two times a day (BID) | ORAL | 4 refills | Status: DC

## 2018-11-24 MED ORDER — MELOXICAM 15 MG PO TABS: 15.0000 mg | ORAL_TABLET | Freq: Every day | ORAL | 3 refills | Status: DC | PRN

## 2018-11-24 MED ORDER — CHLORTHALIDONE 25 MG PO TABS: 25.0000 mg | ORAL_TABLET | Freq: Every day | ORAL | 4 refills | Status: DC

## 2018-11-24 MED ORDER — ATORVASTATIN CALCIUM 20 MG PO TABS: 20.0000 mg | ORAL_TABLET | Freq: Every day | ORAL | 4 refills | Status: DC

## 2018-11-24 NOTE — Assessment & Plan Note (Signed)
The current medical regimen is effective;  continue present plan and medications.  

## 2018-11-24 NOTE — Patient Instructions (Signed)
William Ball , Thank you for taking time to come for your Medicare Wellness Visit. I appreciate your ongoing commitment to your health goals. Please review the following plan we discussed and let me know if I can assist you in the future.   Screening recommendations/referrals: Colonoscopy: completed 07/2016 Recommended yearly ophthalmology/optometry visit for glaucoma screening and checkup Recommended yearly dental visit for hygiene and checkup  Vaccinations: Influenza vaccine: up to date Pneumococcal vaccine: up to date Tdap vaccine: up to date Shingles vaccine: shingrix eligible, check with your insurance company for coverage     Advanced directives: Advance directive discussed with you today. I have provided a copy for you to complete at home and have notarized. Once this is complete please bring a copy in to our office so we can scan it into your chart.  Conditions/risks identified: diabetic, please schedule your diabetic eye exam.   If you wish to quit smoking, help is available. For free tobacco cessation program offerings call the [facility] at [phone] or Live Well Line at 9190143236. You may also visit [email or web address] or email [email or web address] for more information on other programs.   Next appointment: Follow up in one year for your annual wellness exam.   Preventive Care 74 Years and Older, Male Preventive care refers to lifestyle choices and visits with your health care provider that can promote health and wellness. What does preventive care include?  A yearly physical exam. This is also called an annual well check.  Dental exams once or twice a year.  Routine eye exams. Ask your health care provider how often you should have your eyes checked.  Personal lifestyle choices, including:  Daily care of your teeth and gums.  Regular physical activity.  Eating a healthy diet.  Avoiding tobacco and drug use.  Limiting alcohol use.   Practicing safe sex.  Taking low doses of aspirin every day.  Taking vitamin and mineral supplements as recommended by your health care provider. What happens during an annual well check? The services and screenings done by your health care provider during your annual well check will depend on your age, overall health, lifestyle risk factors, and family history of disease. Counseling  Your health care provider may ask you questions about your:  Alcohol use.  Tobacco use.  Drug use.  Emotional well-being.  Home and relationship well-being.  Sexual activity.  Eating habits.  History of falls.  Memory and ability to understand (cognition).  Work and work Statistician. Screening  You may have the following tests or measurements:  Height, weight, and BMI.  Blood pressure.  Lipid and cholesterol levels. These may be checked every 5 years, or more frequently if you are over 74 years old.  Skin check.  Lung cancer screening. You may have this screening every year starting at age 74 if you have a 30-pack-year history of smoking and currently smoke or have quit within the past 15 years.  Fecal occult blood test (FOBT) of the stool. You may have this test every year starting at age 74.  Flexible sigmoidoscopy or colonoscopy. You may have a sigmoidoscopy every 5 years or a colonoscopy every 10 years starting at age 74.  Prostate cancer screening. Recommendations will vary depending on your family history and other risks.  Hepatitis C blood test.  Hepatitis B blood test.  Sexually transmitted disease (STD) testing.  Diabetes screening. This is done by checking your blood sugar (glucose) after you have not eaten for a while (  fasting). You may have this done every 1-3 years.  Abdominal aortic aneurysm (AAA) screening. You may need this if you are a current or former smoker.  Osteoporosis. You may be screened starting at age 74 if you are at high risk. Talk with your health  care provider about your test results, treatment options, and if necessary, the need for more tests. Vaccines  Your health care provider may recommend certain vaccines, such as:  Influenza vaccine. This is recommended every year.  Tetanus, diphtheria, and acellular pertussis (Tdap, Td) vaccine. You may need a Td booster every 10 years.  Zoster vaccine. You may need this after age 74.  Pneumococcal 13-valent conjugate (PCV13) vaccine. One dose is recommended after age 74.  Pneumococcal polysaccharide (PPSV23) vaccine. One dose is recommended after age 65. Talk to your health care provider about which screenings and vaccines you need and how often you need them. This information is not intended to replace advice given to you by your health care provider. Make sure you discuss any questions you have with your health care provider. Document Released: 05/18/2015 Document Revised: 01/09/2016 Document Reviewed: 02/20/2015 Elsevier Interactive Patient Education  2017 [place] Prevention in the Home Falls can cause injuries. They can happen to people of all ages. There are many things you can do to make your home safe and to help prevent falls. What can I do on the outside of my home?  Regularly fix the edges of walkways and driveways and fix any cracks.  Remove anything that might make you trip as you walk through a door, such as a raised step or threshold.  Trim any bushes or trees on the path to your home.  Use bright outdoor lighting.  Clear any walking paths of anything that might make someone trip, such as rocks or tools.  Regularly check to see if handrails are loose or broken. Make sure that both sides of any steps have handrails.  Any raised decks and porches should have guardrails on the edges.  Have any leaves, snow, or ice cleared regularly.  Use sand or salt on walking paths during winter.  Clean up any spills in your garage right away. This includes oil or  grease spills. What can I do in the bathroom?  Use night lights.  Install grab bars by the toilet and in the tub and shower. Do not use towel bars as grab bars.  Use non-skid mats or decals in the tub or shower.  If you need to sit down in the shower, use a plastic, non-slip stool.  Keep the floor dry. Clean up any water that spills on the floor as soon as it happens.  Remove soap buildup in the tub or shower regularly.  Attach bath mats securely with double-sided non-slip rug tape.  Do not have throw rugs and other things on the floor that can make you trip. What can I do in the bedroom?  Use night lights.  Make sure that you have a light by your bed that is easy to reach.  Do not use any sheets or blankets that are too big for your bed. They should not hang down onto the floor.  Have a firm chair that has side arms. You can use this for support while you get dressed.  Do not have throw rugs and other things on the floor that can make you trip. What can I do in the kitchen?  Clean up any spills right away.  Avoid  walking on wet floors.  Keep items that you use a lot in easy-to-reach places.  If you need to reach something above you, use a strong step stool that has a grab bar.  Keep electrical cords out of the way.  Do not use floor polish or wax that makes floors slippery. If you must use wax, use non-skid floor wax.  Do not have throw rugs and other things on the floor that can make you trip. What can I do with my stairs?  Do not leave any items on the stairs.  Make sure that there are handrails on both sides of the stairs and use them. Fix handrails that are broken or loose. Make sure that handrails are as long as the stairways.  Check any carpeting to make sure that it is firmly attached to the stairs. Fix any carpet that is loose or worn.  Avoid having throw rugs at the top or bottom of the stairs. If you do have throw rugs, attach them to the floor with  carpet tape.  Make sure that you have a light switch at the top of the stairs and the bottom of the stairs. If you do not have them, ask someone to add them for you. What else can I do to help prevent falls?  Wear shoes that:  Do not have high heels.  Have rubber bottoms.  Are comfortable and fit you well.  Are closed at the toe. Do not wear sandals.  If you use a stepladder:  Make sure that it is fully opened. Do not climb a closed stepladder.  Make sure that both sides of the stepladder are locked into place.  Ask someone to hold it for you, if possible.  Clearly mark and make sure that you can see:  Any grab bars or handrails.  First and last steps.  Where the edge of each step is.  Use tools that help you move around (mobility aids) if they are needed. These include:  Canes.  Walkers.  Scooters.  Crutches.  Turn on the lights when you go into a dark area. Replace any light bulbs as soon as they burn out.  Set up your furniture so you have a clear path. Avoid moving your furniture around.  If any of your floors are uneven, fix them.  If there are any pets around you, be aware of where they are.  Review your medicines with your doctor. Some medicines can make you feel dizzy. This can increase your chance of falling. Ask your doctor what other things that you can do to help prevent falls. This information is not intended to replace advice given to you by your health care provider. Make sure you discuss any questions you have with your health care provider. Document Released: 02/15/2009 Document Revised: 09/27/2015 Document Reviewed: 05/26/2014 Elsevier Interactive Patient Education  2017 [employer].

## 2018-11-24 NOTE — Progress Notes (Addendum)
BP 136/76    Subjective:    Patient ID: William Ball, male    DOB: [birth year], 73 y.o.   MRN: [id number]  HPI: William Ball is a 73 y.o. male  Med check Discussed with patient medication having jock itch changes discussed hygiene better care discussed diabetes evidently poor control and all review hemoglobin A1c of 7.8 today.  Not bothered by his legs with peripheral artery disease had lung CT couple months ago with follow-up planned. Cholesterol stable. Still smoking unfortunately   Relevant past medical, surgical, family and social history reviewed and updated as indicated. Interim medical history since our last visit reviewed. Allergies and medications reviewed and updated.  Review of Systems  Constitutional: Negative.   HENT: Negative.   Eyes: Negative.   Respiratory: Negative.   Cardiovascular: Negative.   Gastrointestinal: Negative.   Endocrine: Negative.   Genitourinary: Negative.   Musculoskeletal: Negative.   Skin: Negative.   Allergic/Immunologic: Negative.   Neurological: Negative.   Hematological: Negative.   Psychiatric/Behavioral: Negative.     Per HPI unless specifically indicated above     Objective:    BP 136/76   Wt Readings from Last 3 Encounters:  11/24/18 188 lb 11.2 oz (85.6 kg)  10/08/18 199 lb (90.3 kg)  09/24/18 188 lb (85.3 kg)    Physical Exam  Results for orders placed or performed in visit on 40/34/74  Basic metabolic panel  Result Value Ref Range   Glucose 138 (H) 65 - 99 mg/dL   BUN 29 (H) 8 - 27 mg/dL   Creatinine, Ser 1.27 0.76 - 1.27 mg/dL   GFR calc non Af Amer 56 (L) >59 mL/min/1.73   GFR calc Af Amer 64 >59 mL/min/1.73   BUN/Creatinine Ratio 23 10 - 24   Sodium 138 134 - 144 mmol/L   Potassium 4.4 3.5 - 5.2 mmol/L   Chloride 101 96 - 106 mmol/L   CO2 19 (L) 20 - 29 mmol/L   Calcium 9.9 8.6 - 10.2 mg/dL  Bayer DCA Hb A1c Waived  Result Value Ref Range   HB A1C (BAYER DCA - WAIVED) 6.9 <7.0 %  LP+ALT+AST Piccolo,  Waived  Result Value Ref Range   ALT (SGPT) Piccolo, Waived 44 10 - 47 U/L   AST (SGOT) Piccolo, Waived 36 11 - 38 U/L   Cholesterol Piccolo, Waived 117 <200 mg/dL   HDL Chol Piccolo, Waived CANCELED    Triglycerides Piccolo,Waived 477 (H) <150 mg/dL      Assessment & Plan:   Problem List Items Addressed This Visit      Cardiovascular and Mediastinum   Type 2 DM with CKD stage 3 and hypertension (HCC)    Taking 250 mg of metformin daily having poor control with jock itch changes. Recommended over-the-counter jock itch spray better hygiene and increase metformin to 500 twice daily      Senile purpura [facility])    Discussed care      Aortic atherosclerosis ([place])    The current medical regimen is effective;  continue present plan and medications.       PAD (peripheral artery disease) (HCC)    The current medical regimen is effective;  continue present plan and medications.         Musculoskeletal and Integument   Hip arthritis     Genitourinary   BPH (benign prostatic hyperplasia)    The current medical regimen is effective;  continue present plan and medications.  Other   Hyperlipidemia    The current medical regimen is effective;  continue present plan and medications.       Advanced care planning/counseling discussion    A voluntary discussion about advanced care planning including explanation and discussion of advanced directives was extentively discussed with the patient.  Explained about the healthcare proxy and living will was reviewed and packet with forms with expiration of how to fill them out was given.  Time spent: Encounter 16+ min individuals present: Patient       Other Visit Diagnoses    Essential hypertension, benign       Essential hypertension       Diabetes mellitus without complication ([place])          Telemedicine using audio/video telecommunications for a synchronous communication visit. Today's visit due to COVID-19 isolation  precautions I connected with and verified that I am speaking with the correct person using two identifiers.   I discussed the limitations, risks, security and privacy concerns of performing an evaluation and management service by telecommunication and the availability of in person appointments. I also discussed with the patient that there may be a patient responsible charge related to this service. The patient expressed understanding and agreed to proceed. The patient's location is home. I am at home.   I discussed the assessment and treatment plan with the patient. The patient was provided an opportunity to ask questions and all were answered. The patient agreed with the plan and demonstrated an understanding of the instructions.   The patient was advised to call back or seek an in-person evaluation if the symptoms worsen or if the condition fails to improve as anticipated.   I provided 21+ minutes of time during this encounter. Follow up plan: Return for Physical Exam.

## 2018-11-24 NOTE — Assessment & Plan Note (Signed)
Taking 250 mg of metformin daily having poor control with jock itch changes. Recommended over-the-counter jock itch spray better hygiene and increase metformin to 500 twice daily

## 2018-11-24 NOTE — Progress Notes (Signed)
Subjective:   William Ball is a 73 y.o. male who presents for Medicare Annual/Subsequent preventive examination.  Review of Systems:   Cardiac Risk Factors include: advanced age (>58men, >4 women);hypertension;diabetes mellitus;dyslipidemia;smoking/ tobacco exposure;male gender     Objective:    Vitals: BP 138/62 (BP Location: Right Arm, Patient Position: Sitting, Cuff Size: Normal)   Pulse (!) 57   Temp 98.2 F (36.8 C) (Temporal)   Resp 17   Ht 5' 9.5" (1.765 m)   Wt 188 lb 11.2 oz (85.6 kg)   SpO2 98%   BMI 27.47 kg/m   Body mass index is 27.47 kg/m.  Advanced Directives 08/06/2017 01/27/2017 01/27/2017 09/17/2016 11/13/2015  Does Patient Have a Medical Advance Directive? No No No No No  Would patient like information on creating a medical advance directive? Yes (MAU/Ambulatory/Procedural Areas - Information given) No - Patient declined - - -    Tobacco Social History   Tobacco Use  Smoking Status Current Every Day Smoker  . Packs/day: 1.50  . Years: 55.00  . Pack years: 82.50  . Types: Cigarettes  Smokeless Tobacco Never Used     Ready to quit: Yes Counseling given: Yes   Clinical Intake:  Pre-visit preparation completed: Yes  Pain : No/denies pain     Nutritional Status: BMI 25 -29 Overweight Nutritional Risks: None Diabetes: Yes CBG done?: No Did pt. bring in CBG monitor from home?: No  How often do you need to have someone help you when you read instructions, pamphlets, or other written materials from your doctor or pharmacy?: 1 - Never  Nutrition Risk Assessment:  Has the patient had any N/V/D within the last 2 months?  No  Does the patient have any non-healing wounds?  No  Has the patient had any unintentional weight loss or weight gain?  No   Diabetes:  Is the patient diabetic?  Yes  If diabetic, was a CBG obtained today?  No  Did the patient bring in their glucometer from home?  No  How often do you monitor your CBG's? Doesn't monitor   Financial Strains and Diabetes Management:  Are you having any financial strains with the device, your supplies or your medication? No .  Does the patient want to be seen by Chronic Care Management for management of their diabetes?  No  Would the patient like to be referred to a Nutritionist or for Diabetic Management?  No   Diabetic Exams:  Diabetic Eye Exam: Overdue for diabetic eye exam. Pt has been advised about the importance in completing this exam.   Diabetic Foot Exam: will due next time patient is in office for physical.   Interpreter Needed?: No  Information entered by :: Tiffany Hill,LPN  Past Medical History:  Diagnosis Date  . Diabetes mellitus without complication (HCC)    borderline  . Hepatomegaly   . Hyperlipidemia   . Hypertension   . Substance abuse [facility])    Past Surgical History:  Procedure Laterality Date  . ANGIOPLASTY    . APPENDECTOMY    . BONE MARROW TRANSPLANT Left    ankle.  patient unsure of this but thinks this is what happened  . CARDIAC SURGERY    . CATARACT EXTRACTION W/PHACO Left 11/13/2015   Procedure: CATARACT EXTRACTION PHACO AND INTRAOCULAR LENS PLACEMENT (IOC);  Surgeon: [name], MD;  Location: [facility] ORS;  Service: Ophthalmology;  Laterality: Left;  [place] 1.06 AP% 23.9 CDE 15.81 Fluid pack lot # [id number]  . CATARACT EXTRACTION W/PHACO  Right 11/27/2015   Procedure: CATARACT EXTRACTION PHACO AND INTRAOCULAR LENS PLACEMENT (IOC);  Surgeon: [name], MD;  Location: [facility] ORS;  Service: Ophthalmology;  Laterality: Right;  [place] 1.06 AP% 23.8 CDE 15.80 Fluid pack lot # [id number]  . COLONOSCOPY  2012   polyps, diverticulosis , 5yr repeat  . COLONOSCOPY WITH PROPOFOL N/A 07/17/2016   Procedure: COLONOSCOPY WITH PROPOFOL;  Surgeon: [name], MD;  Location: [facility] ENDOSCOPY;  Service: Endoscopy;  Laterality: N/A;  . FRACTURE SURGERY Left 1982   foot, leg.unsure if metal in these parts.possibly replaced ankle  . PERIPHERAL VASCULAR  CATHETERIZATION Left 12/27/2015   Procedure: Lower Extremity Angiography;  Surgeon: Algernon Huxley, MD;  Location: [place] [facility];  Service: Cardiovascular;  Laterality: Left;   Family History  Problem Relation Age of Onset  . Cancer Mother   . Heart disease Mother   . Hyperlipidemia Mother   . Heart disease Father   . Hyperlipidemia Father    Social History   Socioeconomic History  . Marital status: Married    Spouse name: Not on file  . Number of children: Not on file  . Years of education: Not on file  . Highest education level: 9th grade  Occupational History  . Occupation: working full time   Scientific laboratory technician  . Financial resource strain: Not hard at all  . Food insecurity    Worry: Never true    Inability: Never true  . Transportation needs    Medical: No    Non-medical: No  Tobacco Use  . Smoking status: Current Every Day Smoker    Packs/day: 1.50    Years: 55.00    Pack years: 82.50    Types: Cigarettes  . Smokeless tobacco: Never Used  Substance and Sexual Activity  . Alcohol use: Yes    Alcohol/week: 28.0 standard drinks    Types: 28 Cans of beer per week    Comment: 4-6 daily beer  . Drug use: No  . Sexual activity: Not on file  Lifestyle  . Physical activity    Days per week: 0 days    Minutes per session: 0 min  . Stress: Not at all  Relationships  . Social Herbalist on phone: Never    Gets together: Once a week    Attends religious service: Never    Active member of club or organization: No    Attends meetings of clubs or organizations: Never    Relationship status: Married  Other Topics Concern  . Not on file  Social History Narrative   Works 5 days a week 8 hour days     Outpatient Encounter Medications as of 11/24/2018  Medication Sig  . amLODipine (NORVASC) 10 MG tablet TAKE 1 TABLET BY MOUTH EVERY DAY  . atorvastatin (LIPITOR) 20 MG tablet TAKE 1 TABLET BY MOUTH DAILY  . chlorthalidone (HYGROTON) 25 MG tablet TAKE 1 TABLET  (25 MG TOTAL) BY MOUTH DAILY.  [name] clopidogrel (PLAVIX) 75 MG tablet TAKE 1 TABLET BY MOUTH EVERY DAY  . lisinopril (ZESTRIL) 40 MG tablet TAKE 1 TABLET BY MOUTH EVERY DAY  . meloxicam (MOBIC) 15 MG tablet TAKE 1 TABLET (15 MG TOTAL) BY MOUTH DAILY AS NEEDED FOR PAIN.  . metFORMIN (GLUCOPHAGE-XR) 500 MG 24 hr tablet TAKE 0.5 TABLETS (250 MG TOTAL) BY MOUTH DAILY.  . metoprolol succinate (TOPROL-XL) 50 MG 24 hr tablet Take 1 tablet (50 mg total) by mouth daily. KEEP UPCOMING APPT  . [DISCONTINUED] metFORMIN (  GLUCOPHAGE-XR) 500 MG 24 hr tablet TAKE 0.5 TABLETS (250 MG TOTAL) BY MOUTH DAILY.   No facility-administered encounter medications on file as of 11/24/2018.     Activities of Daily Living In your present state of health, do you have any difficulty performing the following activities: 11/24/2018  Hearing? N  Vision? N  Difficulty concentrating or making decisions? N  Walking or climbing stairs? N  Dressing or bathing? N  Doing errands, shopping? N  Preparing Food and eating ? N  Using the Toilet? N  In the past six months, have you accidently leaked urine? N  Do you have problems with loss of bowel control? N  Managing your Medications? N  Managing your Finances? N  Housekeeping or managing your Housekeeping? N  Some recent data might be hidden    Patient Care Team: Guadalupe Maple, MD as PCP - General (Family Medicine) Lollie Sails, MD as Consulting Physician (Gastroenterology)   Assessment:   This is a routine wellness examination for [place].  Exercise Activities and Dietary recommendations Current Exercise Habits: The patient does not participate in regular exercise at present, Exercise limited by: respiratory conditions(s)  Goals    . Quit Smoking     Smoking cessation discussed       Fall Risk Fall Risk  11/24/2018 08/11/2017 08/06/2017 02/09/2017 07/28/2016  Falls in the past year? 0 No No No No   FALL RISK PREVENTION PERTAINING TO THE HOME:  Any stairs in or  around the home? Yes  If so, are there any without handrails? No   Home free of loose throw rugs in walkways, pet beds, electrical cords, etc? Yes  Adequate lighting in your home to reduce risk of falls? No   ASSISTIVE DEVICES UTILIZED TO PREVENT FALLS:  Life alert? No  Use of a cane, walker or w/c? No  Grab bars in the bathroom? No  Shower chair or bench in shower? No  Elevated toilet seat or a handicapped toilet? No    TIMED UP AND GO:  Was the test performed? Yes .  Length of time to ambulate 10 feet: 10 sec.   GAIT:  Appearance of gait: Gait steady and fast without the use of an assistive device.  Education: Fall risk prevention has been discussed.  Intervention(s) required? no DME/home health order needed?  No    Depression Screen PHQ 2/9 Scores 11/24/2018 08/11/2017 08/06/2017 02/09/2017  PHQ - 2 Score 0 0 0 0    Cognitive Function     6CIT Screen 08/06/2017  What Year? 0 points  What month? 0 points  What time? 0 points  Count back from 20 0 points  Months in reverse 0 points  Repeat phrase 0 points  Total Score 0    Immunization History  Administered Date(s) Administered  . Influenza, High Dose Seasonal PF 03/10/2016, 02/09/2017, 02/18/2018  . Influenza-Unspecified 06/05/2014  . Pneumococcal Conjugate-13 06/05/2014  . Pneumococcal-Unspecified 12/24/2009  . Td 05/05/2005  . Tdap 12/05/2015    Qualifies for Shingles Vaccine? Yes  Zostavax completed n/a. Due for Shingrix. Education has been provided regarding the importance of this vaccine. Pt has been advised to call insurance company to determine out of pocket expense. Advised may also receive vaccine at local pharmacy or Health Dept. Verbalized acceptance and understanding.  Tdap: up to date   Flu Vaccine: up to date   Pneumococcal Vaccine: up to date   Screening Tests Health Maintenance  Topic Date Due  . OPHTHALMOLOGY EXAM  09/05/2017  . FOOT EXAM  10/30/2017  . HEMOGLOBIN A1C  08/20/2018  .  INFLUENZA VACCINE  12/04/2018  . COLONOSCOPY  07/17/2021  . TETANUS/TDAP  12/04/2025  . Hepatitis C Screening  Completed  . PNA vac Low Risk Adult  Completed   Cancer Screenings:  Colorectal Screening: Completed 07/17/2016. Repeat every 5 years  Lung Cancer Screening: (Low Dose CT Chest recommended if Age 61-80 years, 30 pack-year currently smoking OR have quit w/in 15years.) does qualify.   Completed 10/08/2018  Additional Screening:  Hepatitis C Screening: does qualify; Completed 06/07/2015  Dental Screening: Recommended annual dental exams for proper oral hygiene  Community Resource Referral:  CRR required this visit?  No        Plan:  I have personally reviewed and addressed the Medicare Annual Wellness questionnaire and have noted the following in the patient's chart:  A. Medical and social history B. Use of alcohol, tobacco or illicit drugs  C. Current medications and supplements D. Functional ability and status E.  Nutritional status F.  Physical activity G. Advance directives H. List of other physicians I.  Hospitalizations, surgeries, and ER visits in previous 12 months J.  [place] such as hearing and vision if needed, cognitive and depression L. Referrals and appointments   In addition, I have reviewed and discussed with patient certain preventive protocols, quality metrics, and best practice recommendations. A written personalized care plan for preventive services as well as general preventive health recommendations were provided to patient.   Signed,   Bevelyn Ngo, LPN  3/70/4888 Nurse Health Advisor  Nurse Notes: none

## 2018-11-24 NOTE — Assessment & Plan Note (Signed)
A voluntary discussion about advanced care planning including explanation and discussion of advanced directives was extentively discussed with the patient.  Explained about the healthcare proxy and living will was reviewed and packet with forms with expiration of how to fill them out was given.  Time spent: Encounter 16+ min individuals present: Patient 

## 2018-11-24 NOTE — Assessment & Plan Note (Signed)
Discussed care

## 2018-11-25 LAB — LIPID PANEL W/O CHOL/HDL RATIO
Cholesterol, Total: 100 mg/dL (ref 100–199)
HDL: 35 mg/dL — ABNORMAL LOW
LDL Calculated: 27 mg/dL (ref 0–99)
Triglycerides: 190 mg/dL — ABNORMAL HIGH (ref 0–149)
VLDL Cholesterol Cal: 38 mg/dL (ref 5–40)

## 2018-11-25 LAB — COMPREHENSIVE METABOLIC PANEL WITH GFR
ALT: 42 IU/L (ref 0–44)
AST: 37 IU/L (ref 0–40)
Albumin/Globulin Ratio: 2.3 — ABNORMAL HIGH (ref 1.2–2.2)
Albumin: 4.8 g/dL — ABNORMAL HIGH (ref 3.7–4.7)
Alkaline Phosphatase: 84 IU/L (ref 39–117)
BUN/Creatinine Ratio: 27 — ABNORMAL HIGH (ref 10–24)
BUN: 30 mg/dL — ABNORMAL HIGH (ref 8–27)
Bilirubin Total: 0.3 mg/dL (ref 0.0–1.2)
CO2: 17 mmol/L — ABNORMAL LOW (ref 20–29)
Calcium: 9.4 mg/dL (ref 8.6–10.2)
Chloride: 103 mmol/L (ref 96–106)
Creatinine, Ser: 1.12 mg/dL (ref 0.76–1.27)
GFR calc Af Amer: 75 mL/min/1.73
GFR calc non Af Amer: 65 mL/min/1.73
Globulin, Total: 2.1 g/dL (ref 1.5–4.5)
Glucose: 174 mg/dL — ABNORMAL HIGH (ref 65–99)
Potassium: 4.7 mmol/L (ref 3.5–5.2)
Sodium: 137 mmol/L (ref 134–144)
Total Protein: 6.9 g/dL (ref 6.0–8.5)

## 2018-11-25 LAB — CBC WITH DIFFERENTIAL/PLATELET
Basophils Absolute: 0.1 x10E3/uL (ref 0.0–0.2)
Basos: 1 %
EOS (ABSOLUTE): 0.6 x10E3/uL — ABNORMAL HIGH (ref 0.0–0.4)
Eos: 8 %
Hematocrit: 38.4 % (ref 37.5–51.0)
Hemoglobin: 12.8 g/dL — ABNORMAL LOW (ref 13.0–17.7)
Immature Grans (Abs): 0 x10E3/uL (ref 0.0–0.1)
Immature Granulocytes: 1 %
Lymphocytes Absolute: 2.4 x10E3/uL (ref 0.7–3.1)
Lymphs: 31 %
MCH: 31.5 pg (ref 26.6–33.0)
MCHC: 33.3 g/dL (ref 31.5–35.7)
MCV: 95 fL (ref 79–97)
Monocytes Absolute: 0.7 x10E3/uL (ref 0.1–0.9)
Monocytes: 9 %
Neutrophils Absolute: 4 x10E3/uL (ref 1.4–7.0)
Neutrophils: 50 %
Platelets: 258 x10E3/uL (ref 150–450)
RBC: 4.06 x10E6/uL — ABNORMAL LOW (ref 4.14–5.80)
RDW: 12.3 % (ref 11.6–15.4)
WBC: 7.8 x10E3/uL (ref 3.4–10.8)

## 2018-11-25 LAB — TSH: TSH: 3.11 u[IU]/mL (ref 0.450–4.500)

## 2018-11-25 LAB — PSA: Prostate Specific Ag, Serum: 0.6 ng/mL (ref 0.0–4.0)

## 2018-11-27 DIAGNOSIS — T383X5A Adverse effect of insulin and oral hypoglycemic [antidiabetic] drugs, initial encounter: Secondary | ICD-10-CM | POA: Diagnosis not present

## 2018-11-27 DIAGNOSIS — R05 Cough: Secondary | ICD-10-CM | POA: Diagnosis not present

## 2018-11-27 DIAGNOSIS — R9431 Abnormal electrocardiogram [ECG] [EKG]: Secondary | ICD-10-CM | POA: Diagnosis not present

## 2018-11-27 DIAGNOSIS — B356 Tinea cruris: Secondary | ICD-10-CM | POA: Diagnosis not present

## 2018-11-27 DIAGNOSIS — Z20828 Contact with and (suspected) exposure to other viral communicable diseases: Secondary | ICD-10-CM | POA: Diagnosis not present

## 2018-11-27 DIAGNOSIS — R748 Abnormal levels of other serum enzymes: Secondary | ICD-10-CM | POA: Diagnosis not present

## 2018-11-27 DIAGNOSIS — R197 Diarrhea, unspecified: Secondary | ICD-10-CM | POA: Diagnosis not present

## 2018-11-27 DIAGNOSIS — R6883 Chills (without fever): Secondary | ICD-10-CM | POA: Diagnosis not present

## 2018-11-27 DIAGNOSIS — E1122 Type 2 diabetes mellitus with diabetic chronic kidney disease: Secondary | ICD-10-CM | POA: Diagnosis not present

## 2018-11-27 DIAGNOSIS — E875 Hyperkalemia: Secondary | ICD-10-CM | POA: Diagnosis not present

## 2018-11-27 DIAGNOSIS — R110 Nausea: Secondary | ICD-10-CM | POA: Diagnosis not present

## 2018-11-27 DIAGNOSIS — I129 Hypertensive chronic kidney disease with stage 1 through stage 4 chronic kidney disease, or unspecified chronic kidney disease: Secondary | ICD-10-CM | POA: Diagnosis not present

## 2018-11-27 DIAGNOSIS — T50905A Adverse effect of unspecified drugs, medicaments and biological substances, initial encounter: Secondary | ICD-10-CM | POA: Diagnosis not present

## 2018-11-27 DIAGNOSIS — R109 Unspecified abdominal pain: Secondary | ICD-10-CM | POA: Diagnosis not present

## 2018-11-29 ENCOUNTER — Telehealth: Payer: Self-pay | Admitting: Family Medicine

## 2018-11-29 NOTE — Telephone Encounter (Signed)
Pt called in to schedule hosp fu scheduled him for Friday with William Ball. He mentioned that [facility] is wanting him to stop taking his metformin until he see's someone. Please advise.

## 2018-11-29 NOTE — Telephone Encounter (Signed)
Will discuss at upcoming OV

## 2018-11-30 NOTE — Telephone Encounter (Signed)
Tried calling pt to let him know that Apolonio Schneiders will discuss at upcoming appt about him stopping metformin, no answer, no vm

## 2018-12-03 ENCOUNTER — Other Ambulatory Visit: Payer: Self-pay

## 2018-12-03 ENCOUNTER — Encounter: Payer: Self-pay | Admitting: Family Medicine

## 2018-12-03 ENCOUNTER — Ambulatory Visit (INDEPENDENT_AMBULATORY_CARE_PROVIDER_SITE_OTHER): Payer: Medicare HMO | Admitting: Family Medicine

## 2018-12-03 VITALS — BP 142/66 | HR 55 | Temp 98.8°F

## 2018-12-03 DIAGNOSIS — N183 Chronic kidney disease, stage 3 unspecified: Secondary | ICD-10-CM

## 2018-12-03 DIAGNOSIS — I10 Essential (primary) hypertension: Secondary | ICD-10-CM

## 2018-12-03 DIAGNOSIS — E1122 Type 2 diabetes mellitus with diabetic chronic kidney disease: Secondary | ICD-10-CM

## 2018-12-03 DIAGNOSIS — I129 Hypertensive chronic kidney disease with stage 1 through stage 4 chronic kidney disease, or unspecified chronic kidney disease: Secondary | ICD-10-CM | POA: Diagnosis not present

## 2018-12-03 DIAGNOSIS — I152 Hypertension secondary to endocrine disorders: Secondary | ICD-10-CM

## 2018-12-03 DIAGNOSIS — E1159 Type 2 diabetes mellitus with other circulatory complications: Secondary | ICD-10-CM

## 2018-12-03 MED ORDER — EMPAGLIFLOZIN 10 MG PO TABS: 10.0000 mg | ORAL_TABLET | Freq: Every day | ORAL | 0 refills | Status: DC

## 2018-12-03 MED ORDER — AMLODIPINE-VALSARTAN-HCTZ 10-160-25 MG PO TABS: 1.0000 | ORAL_TABLET | Freq: Every day | ORAL | 0 refills | Status: DC

## 2018-12-03 NOTE — Patient Instructions (Addendum)
If the new blood pressure medicine is covered by your insurance, you may stop all of your other blood pressure medicines aside from metoprolol as they are otherwise all included in the new combination tablet (so stop lisinopril, chlorthalidone, and amlodipine if you start the combination tablet)

## 2018-12-03 NOTE — Progress Notes (Signed)
BP (!) 142/66   Pulse (!) 55   Temp 98.8 F (37.1 C) (Oral)   SpO2 95%    Subjective:    Patient ID: William Ball, male    DOB: Feb 16, 1946, 73 y.o.   MRN: [id number]  HPI: William Ball is a 73 y.o. male  Chief Complaint  Patient presents with  . Hospitalization Follow-up    seen at [facility], has not taken Metforming due to stmach issues. Took last dose Saturday morning   Patient presenting today for ER follow up for significant diarrhea that was thought to be related to the increase in his metformin dose. Also had a short period of nausea and chills associated. On review of [facility] ED notes, his labs were notable for an elevated potassium level and lipase level and neg COVID 19 testing. Patient has held metformin since this ER visit and states his sxs are resolving well. Denies further nausea, no vomiting, fevers, chills, cough, melena, sick contacts, recent travel, new foods.   Relevant past medical, surgical, family and social history reviewed and updated as indicated. Interim medical history since our last visit reviewed. Allergies and medications reviewed and updated.  Review of Systems  Per HPI unless specifically indicated above     Objective:    BP (!) 142/66   Pulse (!) 55   Temp 98.8 F (37.1 C) (Oral)   SpO2 95%   Wt Readings from Last 3 Encounters:  11/24/18 188 lb 11.2 oz (85.6 kg)  10/08/18 199 lb (90.3 kg)  09/24/18 188 lb (85.3 kg)    Physical Exam Vitals signs and nursing note reviewed.  Constitutional:      Appearance: Normal appearance.  HENT:     Head: Atraumatic.  Eyes:     Extraocular Movements: Extraocular movements intact.     Conjunctiva/sclera: Conjunctivae normal.  Neck:     Musculoskeletal: Normal range of motion and neck supple.  Cardiovascular:     Rate and Rhythm: Normal rate and regular rhythm.  Pulmonary:     Effort: Pulmonary effort is normal.     Breath sounds: Normal breath sounds.  Abdominal:     General:  Bowel sounds are normal. There is no distension.     Palpations: Abdomen is soft.     Tenderness: There is no abdominal tenderness. There is no right CVA tenderness, left CVA tenderness or guarding.  Musculoskeletal: Normal range of motion.  Skin:    General: Skin is warm and dry.  Neurological:     General: No focal deficit present.     Mental Status: He is oriented to person, place, and time.  Psychiatric:        Mood and Affect: Mood normal.        Thought Content: Thought content normal.        Judgment: Judgment normal.     Results for orders placed or performed in visit on 11/24/18  CBC with Differential  Result Value Ref Range   WBC 7.8 3.4 - 10.8 x10E3/uL   RBC 4.06 (L) 4.14 - 5.80 x10E6/uL   Hemoglobin 12.8 (L) 13.0 - 17.7 g/dL   Hematocrit 38.4 37.5 - 51.0 %   MCV 95 79 - 97 fL   MCH 31.5 26.6 - 33.0 pg   MCHC 33.3 31.5 - 35.7 g/dL   RDW 12.3 11.6 - 15.4 %   Platelets 258 150 - 450 x10E3/uL   Neutrophils 50 Not Estab. %   Lymphs 31 Not Estab. %  Monocytes 9 Not Estab. %   Eos 8 Not Estab. %   Basos 1 Not Estab. %   Neutrophils Absolute 4.0 1.4 - 7.0 x10E3/uL   Lymphocytes Absolute 2.4 0.7 - 3.1 x10E3/uL   Monocytes Absolute 0.7 0.1 - 0.9 x10E3/uL   EOS (ABSOLUTE) 0.6 (H) 0.0 - 0.4 x10E3/uL   Basophils Absolute 0.1 0.0 - 0.2 x10E3/uL   Immature Granulocytes 1 Not Estab. %   Immature Grans (Abs) 0.0 0.0 - 0.1 x10E3/uL  Comp Met (CMET)  Result Value Ref Range   Glucose 174 (H) 65 - 99 mg/dL   BUN 30 (H) 8 - 27 mg/dL   Creatinine, Ser 1.12 0.76 - 1.27 mg/dL   GFR calc non Af Amer 65 >59 mL/min/1.73   GFR calc Af Amer 75 >59 mL/min/1.73   BUN/Creatinine Ratio 27 (H) 10 - 24   Sodium 137 134 - 144 mmol/L   Potassium 4.7 3.5 - 5.2 mmol/L   Chloride 103 96 - 106 mmol/L   CO2 17 (L) 20 - 29 mmol/L   Calcium 9.4 8.6 - 10.2 mg/dL   Total Protein 6.9 6.0 - 8.5 g/dL   Albumin 4.8 (H) 3.7 - 4.7 g/dL   Globulin, Total 2.1 1.5 - 4.5 g/dL   Albumin/Globulin Ratio 2.3  (H) 1.2 - 2.2   Bilirubin Total 0.3 0.0 - 1.2 mg/dL   Alkaline Phosphatase 84 39 - 117 IU/L   AST 37 0 - 40 IU/L   ALT 42 0 - 44 IU/L  Lipid Panel w/o Chol/HDL Ratio  Result Value Ref Range   Cholesterol, Total 100 100 - 199 mg/dL   Triglycerides 190 (H) 0 - 149 mg/dL   HDL 35 (L) >39 mg/dL   VLDL Cholesterol Cal 38 5 - 40 mg/dL   LDL Calculated 27 0 - 99 mg/dL  PSA  Result Value Ref Range   Prostate Specific Ag, Serum 0.6 0.0 - 4.0 ng/mL  TSH  Result Value Ref Range   TSH 3.110 0.450 - 4.500 uIU/mL  Bayer DCA Hb A1c Waived (STAT)  Result Value Ref Range   HB A1C (BAYER DCA - WAIVED) 7.8 (H) <7.0 %  Urinalysis, Routine w reflex microscopic  Result Value Ref Range   Specific Gravity, UA <1.005 (L) 1.005 - 1.030   pH, UA 5.0 5.0 - 7.5   Color, UA Yellow Yellow   Appearance Ur Clear Clear   Leukocytes,UA Negative Negative   Protein,UA Negative Negative/Trace   Glucose, UA Negative Negative   Ketones, UA Negative Negative   RBC, UA Negative Negative   Bilirubin, UA Negative Negative   Urobilinogen, Ur 0.2 0.2 - 1.0 mg/dL   Nitrite, UA Negative Negative      Assessment & Plan:   Problem List Items Addressed This Visit      Cardiovascular and Mediastinum   Type 2 DM with CKD stage 3 and hypertension ([place]) - Primary    Continue d/c of metformin, start jardiance both due to potential side effects with metformin and need for greater A1C reduction. Continue working on diet and exercise modifications as well. Recheck in 1 month      Relevant Medications   empagliflozin (JARDIANCE) 10 MG TABS tablet   amLODIPine-Valsartan-HCTZ 10-160-25 MG TABS   Hypertension associated with diabetes ([place])    During visit, patient asks if there's a way to reduce his pill burden with his BP medications. Will try getting a combination pill covered to cut out 3 separate components allowing him to only  have 2 BP medications to take. Directions given with explicit instruction on which separate  medications to stop and which to continue if able to get the combination pill covered (to be taking ONLY 3 part combo pill + metoprolol for antihypertensives). Will update medication list depending on if medication is affordable.       Relevant Medications   empagliflozin (JARDIANCE) 10 MG TABS tablet   amLODIPine-Valsartan-HCTZ 10-160-25 MG TABS    25 minutes spent today in direct counseling and care of patient   Follow up plan: Return in about 4 weeks (around 12/31/2018) for DM, BP.

## 2018-12-06 DIAGNOSIS — E1159 Type 2 diabetes mellitus with other circulatory complications: Secondary | ICD-10-CM | POA: Insufficient documentation

## 2018-12-06 DIAGNOSIS — I152 Hypertension secondary to endocrine disorders: Secondary | ICD-10-CM | POA: Insufficient documentation

## 2018-12-06 NOTE — Assessment & Plan Note (Signed)
Continue d/c of metformin, start jardiance both due to potential side effects with metformin and need for greater A1C reduction. Continue working on diet and exercise modifications as well. Recheck in 1 month

## 2018-12-06 NOTE — Assessment & Plan Note (Signed)
During visit, patient asks if there's a way to reduce his pill burden with his BP medications. Will try getting a combination pill covered to cut out 3 separate components allowing him to only have 2 BP medications to take. Directions given with explicit instruction on which separate medications to stop and which to continue if able to get the combination pill covered (to be taking ONLY 3 part combo pill + metoprolol for antihypertensives). Will update medication list depending on if medication is affordable.

## 2018-12-30 ENCOUNTER — Other Ambulatory Visit: Payer: Self-pay | Admitting: Family Medicine

## 2018-12-30 NOTE — Telephone Encounter (Signed)
Patient has appointment tomorrow

## 2018-12-30 NOTE — Telephone Encounter (Signed)
Requested medication (s) are due for refill today: yes  Requested medication (s) are on the active medication list: yes  Last refill:  12/03/18  Future visit scheduled: yes  Notes to clinic:  Review for refill Was waiting to see if medication was covered by insurance   Requested Prescriptions  Pending Prescriptions Disp Refills   amLODIPine-Valsartan-HCTZ 10-160-25 MG TABS [Pharmacy Med Name: AMLOD-VALSA-HCTZ 10-160-25 MG] 30 tablet 0    Sig: TAKE 1 TABLET BY MOUTH EVERY DAY     There is no refill protocol information for this order

## 2018-12-31 ENCOUNTER — Other Ambulatory Visit: Payer: Self-pay

## 2018-12-31 ENCOUNTER — Encounter: Payer: Self-pay | Admitting: Family Medicine

## 2018-12-31 ENCOUNTER — Ambulatory Visit (INDEPENDENT_AMBULATORY_CARE_PROVIDER_SITE_OTHER): Payer: Medicare HMO | Admitting: Family Medicine

## 2018-12-31 VITALS — BP 148/58 | HR 52 | Temp 98.1°F | Ht 72.0 in | Wt 181.0 lb

## 2018-12-31 DIAGNOSIS — I129 Hypertensive chronic kidney disease with stage 1 through stage 4 chronic kidney disease, or unspecified chronic kidney disease: Secondary | ICD-10-CM | POA: Diagnosis not present

## 2018-12-31 DIAGNOSIS — E1122 Type 2 diabetes mellitus with diabetic chronic kidney disease: Secondary | ICD-10-CM | POA: Diagnosis not present

## 2018-12-31 DIAGNOSIS — E1159 Type 2 diabetes mellitus with other circulatory complications: Secondary | ICD-10-CM

## 2018-12-31 DIAGNOSIS — N183 Chronic kidney disease, stage 3 unspecified: Secondary | ICD-10-CM

## 2018-12-31 DIAGNOSIS — Z23 Encounter for immunization: Secondary | ICD-10-CM

## 2018-12-31 DIAGNOSIS — I152 Hypertension secondary to endocrine disorders: Secondary | ICD-10-CM

## 2018-12-31 DIAGNOSIS — I10 Essential (primary) hypertension: Secondary | ICD-10-CM

## 2018-12-31 MED ORDER — SITAGLIPTIN PHOSPHATE 50 MG PO TABS: 50.0000 mg | ORAL_TABLET | Freq: Every day | ORAL | 2 refills | Status: DC

## 2018-12-31 NOTE — Progress Notes (Signed)
BP (!) 148/58   Pulse (!) 52   Temp 98.1 F (36.7 C) (Oral)   Ht 6' (1.829 m)   Wt 181 lb (82.1 kg)   SpO2 96%   BMI 24.55 kg/m    Subjective:    Patient ID: William Ball, male    DOB: 09-11-1945, 73 y.o.   MRN: [id number]  HPI: William Ball is a 72 y.o. male  Chief Complaint  Patient presents with  . Diabetes    4w f/u  . Hypertension   Patient presenting today for 1 month f/u diabetes and HTN.   DM - Was started on farxiga last month but states he could not afford the medicine so never started it. Went back on the metformin and so far has tolerated it well without the GI side effects. Denies low blood sugar spells. Trying to watch his diet but is not on a strict diet.   HTN - was started on a 3 part combo antihypertensive last time because he expressed desire to reduce pill burden. Was given detailed verbal and written instructions on which medications to stop once he picked up the new pill but states he lost the paper so he has been taking the new combo drug plus his previous regimen. Denies side effects. Home BPs have been 150s/80s per patient when he remembers to check. No CP, SOB, HAs, dizziness.   Relevant past medical, surgical, family and social history reviewed and updated as indicated. Interim medical history since our last visit reviewed. Allergies and medications reviewed and updated.  Review of Systems  Per HPI unless specifically indicated above     Objective:    BP (!) 148/58   Pulse (!) 52   Temp 98.1 F (36.7 C) (Oral)   Ht 6' (1.829 m)   Wt 181 lb (82.1 kg)   SpO2 96%   BMI 24.55 kg/m   Wt Readings from Last 3 Encounters:  12/31/18 181 lb (82.1 kg)  11/24/18 188 lb 11.2 oz (85.6 kg)  10/08/18 199 lb (90.3 kg)    Physical Exam Vitals signs and nursing note reviewed.  Constitutional:      Appearance: Normal appearance.  HENT:     Head: Atraumatic.  Eyes:     Extraocular Movements: Extraocular movements intact.     Conjunctiva/sclera:  Conjunctivae normal.  Neck:     Musculoskeletal: Normal range of motion and neck supple.  Cardiovascular:     Rate and Rhythm: Normal rate and regular rhythm.  Pulmonary:     Effort: Pulmonary effort is normal.     Breath sounds: Normal breath sounds.  Musculoskeletal: Normal range of motion.  Skin:    General: Skin is warm and dry.  Neurological:     General: No focal deficit present.     Mental Status: He is oriented to person, place, and time.  Psychiatric:        Mood and Affect: Mood normal.        Thought Content: Thought content normal.        Judgment: Judgment normal.     Results for orders placed or performed in visit on 12/17/18  HM DIABETES EYE EXAM  Result Value Ref Range   HM Diabetic Eye Exam No Retinopathy No Retinopathy      Assessment & Plan:   Problem List Items Addressed This Visit      Cardiovascular and Mediastinum   Type 2 DM with CKD stage 3 and hypertension ([place])  [place] cost prohibitive. Will add januvia and recheck A1C in 3 months. Lifestyle modifications reviewed. Call with side effects or issues. Continue metformin as long as GI sxs do not return.       Relevant Medications   metFORMIN (GLUCOPHAGE) 1000 MG tablet   sitaGLIPtin (JANUVIA) 50 MG tablet   Hypertension associated with diabetes ([place])    Discussed dangers of taking too much of his BP medications, and reiterated instructions for which medicines to stop and start. New written directions printed out. Will recheck things in 2 weeks once he's strictly on the combo drug and metoprolol and has stopped the individual components. Call with abnormal home readings in the meantime.       Relevant Medications   metFORMIN (GLUCOPHAGE) 1000 MG tablet   sitaGLIPtin (JANUVIA) 50 MG tablet    Other Visit Diagnoses    Flu vaccine need    -  Primary   Relevant Orders   Flu Vaccine QUAD High Dose(Fluad) (Completed)    25 minutes spent today in direct patient care and counseling   Follow up  plan: Return in about 2 weeks (around 01/14/2019) for BP f/u.

## 2018-12-31 NOTE — Patient Instructions (Signed)
You may stop all of your other blood pressure medicines aside from metoprolol as they are otherwise all included in the new combination tablet (so stop lisinopril, chlorthalidone, and amlodipine)

## 2019-01-03 NOTE — Assessment & Plan Note (Signed)
Discussed dangers of taking too much of his BP medications, and reiterated instructions for which medicines to stop and start. New written directions printed out. Will recheck things in 2 weeks once he's strictly on the combo drug and metoprolol and has stopped the individual components. Call with abnormal home readings in the meantime.

## 2019-01-03 NOTE — Assessment & Plan Note (Signed)
[place] cost prohibitive. Will add januvia and recheck A1C in 3 months. Lifestyle modifications reviewed. Call with side effects or issues. Continue metformin as long as GI sxs do not return.

## 2019-01-27 ENCOUNTER — Encounter: Payer: Self-pay | Admitting: Family Medicine

## 2019-01-27 ENCOUNTER — Ambulatory Visit (INDEPENDENT_AMBULATORY_CARE_PROVIDER_SITE_OTHER): Payer: Medicare HMO | Admitting: Family Medicine

## 2019-01-27 ENCOUNTER — Other Ambulatory Visit: Payer: Self-pay

## 2019-01-27 ENCOUNTER — Other Ambulatory Visit: Payer: Self-pay | Admitting: Family Medicine

## 2019-01-27 VITALS — BP 165/66 | HR 53 | Temp 98.5°F | Ht 69.7 in | Wt 183.8 lb

## 2019-01-27 DIAGNOSIS — E1122 Type 2 diabetes mellitus with diabetic chronic kidney disease: Secondary | ICD-10-CM | POA: Diagnosis not present

## 2019-01-27 DIAGNOSIS — I10 Essential (primary) hypertension: Secondary | ICD-10-CM | POA: Diagnosis not present

## 2019-01-27 DIAGNOSIS — E1159 Type 2 diabetes mellitus with other circulatory complications: Secondary | ICD-10-CM | POA: Diagnosis not present

## 2019-01-27 DIAGNOSIS — I129 Hypertensive chronic kidney disease with stage 1 through stage 4 chronic kidney disease, or unspecified chronic kidney disease: Secondary | ICD-10-CM | POA: Diagnosis not present

## 2019-01-27 DIAGNOSIS — N183 Chronic kidney disease, stage 3 unspecified: Secondary | ICD-10-CM

## 2019-01-27 DIAGNOSIS — I152 Hypertension secondary to endocrine disorders: Secondary | ICD-10-CM

## 2019-01-27 MED ORDER — GLIPIZIDE 5 MG PO TABS: 5.0000 mg | ORAL_TABLET | Freq: Two times a day (BID) | ORAL | 2 refills | Status: DC

## 2019-01-27 NOTE — Patient Instructions (Signed)
Blood Pressure Record Sheet To take your blood pressure, you will need a blood pressure machine. You can buy a blood pressure machine (blood pressure monitor) at your clinic, drug store, or online. When choosing one, consider:  An automatic monitor that has an arm cuff.  A cuff that wraps snugly around your upper arm. You should be able to fit only one finger between your arm and the cuff.  A device that stores blood pressure reading results.  Do not choose a monitor that measures your blood pressure from your wrist or finger. Follow your health care provider's instructions for how to take your blood pressure. To use this form:  Get one reading in the morning (a.m.) before you take any medicines.  Get one reading in the evening (p.m.) before supper.  Take at least 2 readings with each blood pressure check. This makes sure the results are correct. Wait 1-2 minutes between measurements.  Write down the results in the spaces on this form.  Repeat this once a week, or as told by your health care provider.  Make a follow-up appointment with your health care provider to discuss the results. Blood pressure log Date: _______________________  a.m. _____________________(1st reading) _____________________(2nd reading)  p.m. _____________________(1st reading) _____________________(2nd reading) Date: _______________________  a.m. _____________________(1st reading) _____________________(2nd reading)  p.m. _____________________(1st reading) _____________________(2nd reading) Date: _______________________  a.m. _____________________(1st reading) _____________________(2nd reading)  p.m. _____________________(1st reading) _____________________(2nd reading) Date: _______________________  a.m. _____________________(1st reading) _____________________(2nd reading)  p.m. _____________________(1st reading) _____________________(2nd reading) Date: _______________________  a.m.  _____________________(1st reading) _____________________(2nd reading)  p.m. _____________________(1st reading) _____________________(2nd reading) This information is not intended to replace advice given to you by your health care provider. Make sure you discuss any questions you have with your health care provider. Document Released: 01/18/2003 Document Revised: 06/19/2017 Document Reviewed: 04/21/2017 Elsevier Patient Education  2020 [employer]  

## 2019-01-27 NOTE — Telephone Encounter (Signed)
Requested medication (s) are due for refill today: yes  Requested medication (s) are on the active medication list: yes  Last refill:  01/03/2019  Future visit scheduled: yes  Notes to clinic:  Review for refill   Requested Prescriptions  Pending Prescriptions Disp Refills   amLODIPine-Valsartan-HCTZ 10-160-25 MG TABS [Pharmacy Med Name: AMLOD-VALSA-HCTZ 10-160-25 MG] 30 tablet 0    Sig: TAKE 1 TABLET BY MOUTH EVERY DAY     There is no refill protocol information for this order

## 2019-01-31 NOTE — Progress Notes (Signed)
BP (!) 165/66   Pulse (!) 53   Temp 98.5 F (36.9 C) (Oral)   Ht 5' 9.7" (1.77 m)   Wt 183 William 12.8 oz (83.4 kg)   SpO2 97%   BMI 26.60 kg/m    Subjective:    Patient ID: William Ball, male    DOB: Jul 23, 1945, 73 y.o.   MRN: [id number]  HPI: WALID LAMBERTI is a 73 y.o. male  Chief Complaint  Patient presents with  . Diabetes  . Hypertension   Patient here today for 1 month f/u DM and HTN.   Was switched per his request to a combination tab for his BP to help reduce pill burden, with detailed instructions given at 2 separate visits about which medications he should be taking once starting the combo pill and which to d/c but is still taking both the combo pill and the separate components that he was to d/c once starting it. Not having any side effects he's aware of. Home BPs running 160s/60s. Denies CP, SOB, HAs, dizziness.   Currently taking only metformin for his DM. Could not afford the [place] due to his coverage. Last A1C above goal. Trying to work on diet changes. No low blood sugar spells.   Relevant past medical, surgical, family and social history reviewed and updated as indicated. Interim medical history since our last visit reviewed. Allergies and medications reviewed and updated.  Review of Systems  Per HPI unless specifically indicated above     Objective:    BP (!) 165/66   Pulse (!) 53   Temp 98.5 F (36.9 C) (Oral)   Ht 5' 9.7" (1.77 m)   Wt 183 William 12.8 oz (83.4 kg)   SpO2 97%   BMI 26.60 kg/m   Wt Readings from Last 3 Encounters:  01/27/19 183 William 12.8 oz (83.4 kg)  12/31/18 181 William (82.1 kg)  11/24/18 188 William 11.2 oz (85.6 kg)    Physical Exam Vitals signs and nursing note reviewed.  Constitutional:      Appearance: Normal appearance.  HENT:     Head: Atraumatic.  Eyes:     Extraocular Movements: Extraocular movements intact.     Conjunctiva/sclera: Conjunctivae normal.  Neck:     Musculoskeletal: Normal range of motion and neck supple.   Cardiovascular:     Rate and Rhythm: Normal rate and regular rhythm.  Pulmonary:     Effort: Pulmonary effort is normal.     Breath sounds: Normal breath sounds.  Musculoskeletal: Normal range of motion.  Skin:    General: Skin is warm and dry.  Neurological:     General: No focal deficit present.     Mental Status: He is oriented to person, place, and time.  Psychiatric:        Mood and Affect: Mood normal.        Thought Content: Thought content normal.        Judgment: Judgment normal.     Results for orders placed or performed in visit on 12/17/18  HM DIABETES EYE EXAM  Result Value Ref Range   HM Diabetic Eye Exam No Retinopathy No Retinopathy      Assessment & Plan:   Problem List Items Addressed This Visit      Cardiovascular and Mediastinum   Type 2 DM with CKD stage 3 and hypertension ([place]) - Primary    Has now had several coverage issues. Will try adding glipizide given affordability and monitor closely for benefit and  tolerance. COntinue working on diet and exercise modifications      Relevant Medications   lisinopril (ZESTRIL) 40 MG tablet   amLODipine (NORVASC) 10 MG tablet   chlorthalidone (HYGROTON) 25 MG tablet   glipiZIDE (GLUCOTROL) 5 MG tablet   Hypertension associated with diabetes ([place])    Still having some confusion over the current regimen, so will d/c combination pill and go back to initial regimen as he's currently doubling up on several medications already at max recommended doses alone. Once back on single doses of the medications will reassess BPs and increase if needed from there. Continue close home monitoring.       Relevant Medications   lisinopril (ZESTRIL) 40 MG tablet   amLODipine (NORVASC) 10 MG tablet   chlorthalidone (HYGROTON) 25 MG tablet   glipiZIDE (GLUCOTROL) 5 MG tablet    Greater than 25 minutes spent today in direct patient care and counseling.    Follow up plan: Return in about 3 months (around 04/28/2019) for CPE.

## 2019-01-31 NOTE — Assessment & Plan Note (Signed)
Has now had several coverage issues. Will try adding glipizide given affordability and monitor closely for benefit and tolerance. COntinue working on diet and exercise modifications

## 2019-01-31 NOTE — Assessment & Plan Note (Signed)
Still having some confusion over the current regimen, so will d/c combination pill and go back to initial regimen as he's currently doubling up on several medications already at max recommended doses alone. Once back on single doses of the medications will reassess BPs and increase if needed from there. Continue close home monitoring.

## 2019-03-09 ENCOUNTER — Other Ambulatory Visit: Payer: Self-pay | Admitting: Family Medicine

## 2019-04-19 ENCOUNTER — Other Ambulatory Visit: Payer: Self-pay | Admitting: Family Medicine

## 2019-05-30 ENCOUNTER — Ambulatory Visit: Payer: Self-pay | Admitting: Family Medicine

## 2019-06-08 ENCOUNTER — Ambulatory Visit (INDEPENDENT_AMBULATORY_CARE_PROVIDER_SITE_OTHER): Payer: Medicare HMO | Admitting: Family Medicine

## 2019-06-08 ENCOUNTER — Encounter: Payer: Self-pay | Admitting: Family Medicine

## 2019-06-08 VITALS — BP 178/72 | HR 64 | Wt 180.0 lb

## 2019-06-08 DIAGNOSIS — I152 Hypertension secondary to endocrine disorders: Secondary | ICD-10-CM

## 2019-06-08 DIAGNOSIS — I10 Essential (primary) hypertension: Secondary | ICD-10-CM

## 2019-06-08 DIAGNOSIS — E1159 Type 2 diabetes mellitus with other circulatory complications: Secondary | ICD-10-CM

## 2019-06-08 MED ORDER — HYDRALAZINE HCL 10 MG PO TABS: 10.0000 mg | ORAL_TABLET | Freq: Two times a day (BID) | ORAL | 0 refills | Status: DC

## 2019-06-08 NOTE — Progress Notes (Signed)
   BP (!) 178/72   Pulse 64   Wt 180 lb (81.6 kg)   BMI 26.05 kg/m    Subjective:    Patient ID: William Ball, male    DOB: 05-04-1946, 74 y.o.   MRN: [id number]  HPI: William Ball is a 74 y.o. male  Chief Complaint  Patient presents with  . Hypertension  . Diabetes    . This visit was completed via telephone due to the restrictions of the COVID-19 pandemic. All issues as above were discussed and addressed. Physical exam was done as above through visual confirmation on telephone. If it was felt that the patient should be evaluated in the office, they were directed there. The patient verbally consented to this visit. . Location of the patient: home . Location of the provider: work . Those involved with this call:  . Provider: Merrie Roof, PA-C . CMA: Lesle Chris, [place] . Front Desk/Registration: Jill Side  . Time spent on call: 15 minutes on the phone discussing health concerns. 5 minutes total spent in review of patient's record and preparation of their chart. I verified patient identity using two factors (patient name and date of birth). Patient consents verbally to being seen via telemedicine visit today.   Patient presenting today to f/u on chronic conditions. Due for labs to recheck A1C.   Home BPs running 170s/80s and above at times. Taking his medications faithfully without side effects. Denies Cp, SOB, HAs, dizziness. Not watching diet or exercising regularly.   Relevant past medical, surgical, family and social history reviewed and updated as indicated. Interim medical history since our last visit reviewed. Allergies and medications reviewed and updated.  Review of Systems  Per HPI unless specifically indicated above     Objective:    BP (!) 178/72   Pulse 64   Wt 180 lb (81.6 kg)   BMI 26.05 kg/m   Wt Readings from Last 3 Encounters:  06/08/19 180 lb (81.6 kg)  01/27/19 183 lb 12.8 oz (83.4 kg)  12/31/18 181 lb (82.1 kg)    Physical Exam  Not able to  perform PE today due to patient lack of access to video technology for today's visit.   Results for orders placed or performed in visit on 12/17/18  HM DIABETES EYE EXAM  Result Value Ref Range   HM Diabetic Eye Exam No Retinopathy No Retinopathy      Assessment & Plan:   Problem List Items Addressed This Visit      Cardiovascular and Mediastinum   Hypertension associated with diabetes ([place]) - Primary    Add hydralazine to current regimen and recheck in person next week. Will perform labs and f/u on DM and PAD at this time as well. Continue close home monitoring in meantime      Relevant Medications   hydrALAZINE (APRESOLINE) 10 MG tablet       Follow up plan: Return for CPE.

## 2019-06-12 NOTE — Assessment & Plan Note (Signed)
Add hydralazine to current regimen and recheck in person next week. Will perform labs and f/u on DM and PAD at this time as well. Continue close home monitoring in meantime

## 2019-06-13 ENCOUNTER — Ambulatory Visit (INDEPENDENT_AMBULATORY_CARE_PROVIDER_SITE_OTHER): Payer: Medicare HMO | Admitting: Family Medicine

## 2019-06-13 ENCOUNTER — Other Ambulatory Visit: Payer: Self-pay

## 2019-06-13 ENCOUNTER — Encounter: Payer: Self-pay | Admitting: Family Medicine

## 2019-06-13 VITALS — BP 149/63 | HR 55 | Temp 98.2°F | Ht 70.0 in | Wt 190.0 lb

## 2019-06-13 DIAGNOSIS — I152 Hypertension secondary to endocrine disorders: Secondary | ICD-10-CM

## 2019-06-13 DIAGNOSIS — I10 Essential (primary) hypertension: Secondary | ICD-10-CM | POA: Diagnosis not present

## 2019-06-13 DIAGNOSIS — I129 Hypertensive chronic kidney disease with stage 1 through stage 4 chronic kidney disease, or unspecified chronic kidney disease: Secondary | ICD-10-CM | POA: Diagnosis not present

## 2019-06-13 DIAGNOSIS — E1122 Type 2 diabetes mellitus with diabetic chronic kidney disease: Secondary | ICD-10-CM | POA: Diagnosis not present

## 2019-06-13 DIAGNOSIS — I739 Peripheral vascular disease, unspecified: Secondary | ICD-10-CM

## 2019-06-13 DIAGNOSIS — Z0000 Encounter for general adult medical examination without abnormal findings: Secondary | ICD-10-CM | POA: Diagnosis not present

## 2019-06-13 DIAGNOSIS — J439 Emphysema, unspecified: Secondary | ICD-10-CM | POA: Diagnosis not present

## 2019-06-13 DIAGNOSIS — E785 Hyperlipidemia, unspecified: Secondary | ICD-10-CM | POA: Diagnosis not present

## 2019-06-13 DIAGNOSIS — I700 Atherosclerosis of aorta: Secondary | ICD-10-CM

## 2019-06-13 DIAGNOSIS — R079 Chest pain, unspecified: Secondary | ICD-10-CM

## 2019-06-13 DIAGNOSIS — N400 Enlarged prostate without lower urinary tract symptoms: Secondary | ICD-10-CM

## 2019-06-13 DIAGNOSIS — N183 Chronic kidney disease, stage 3 unspecified: Secondary | ICD-10-CM | POA: Diagnosis not present

## 2019-06-13 DIAGNOSIS — E1159 Type 2 diabetes mellitus with other circulatory complications: Secondary | ICD-10-CM

## 2019-06-13 LAB — UA/M W/RFLX CULTURE, ROUTINE
Bilirubin, UA: NEGATIVE
Glucose, UA: NEGATIVE
Ketones, UA: NEGATIVE
Leukocytes,UA: NEGATIVE
Nitrite, UA: NEGATIVE
Protein,UA: NEGATIVE
RBC, UA: NEGATIVE
Specific Gravity, UA: 1.02 (ref 1.005–1.030)
Urobilinogen, Ur: 0.2 mg/dL (ref 0.2–1.0)
pH, UA: 5 (ref 5.0–7.5)

## 2019-06-13 LAB — MICROALBUMIN, URINE WAIVED
Creatinine, Urine Waived: 100 mg/dL (ref 10–300)
Microalb, Ur Waived: 80 mg/L — ABNORMAL HIGH (ref 0–19)

## 2019-06-13 MED ORDER — METOPROLOL SUCCINATE ER 25 MG PO TB24: 25.0000 mg | ORAL_TABLET | Freq: Every day | ORAL | 0 refills | Status: DC

## 2019-06-13 MED ORDER — CHLORTHALIDONE 50 MG PO TABS: 50.0000 mg | ORAL_TABLET | Freq: Every day | ORAL | 0 refills | Status: DC

## 2019-06-13 NOTE — Progress Notes (Signed)
BP (!) 149/63   Pulse (!) 55   Temp 98.2 F (36.8 C) (Oral)   Ht 5\' 10"  (1.778 m)   Wt 190 lb (86.2 kg)   SpO2 97%   BMI 27.26 kg/m    Subjective:    Patient ID: William Ball, male    DOB: 07/23/1945, 74 y.o.   MRN: [id number]  HPI: William Ball is a 74 y.o. male presenting on 06/13/2019 for comprehensive medical examination. Current medical complaints include:see below  HTN - Home BPs 160s/50s since starting the hydralazine. Tolerating well without side effects. Compliant with remainder of regimen. Denies CP, SOB, HAs, dizziness. Not following DASH diet but tries to stay active.   Does not check BSs at home because his machine is messed up. No low blood sugar spells. Compliant with regimen. Not following strict diet.   HLD, PAD, aortic atherosclerosis - On lipitor, plavix regimen. Tolerating well without claudication, myalgias, bleeding or bruising issues.   Legs hurting daily, works on concrete daily and states after work each day having to hobble into the house due to ankle pain. Takes daily meloxicam which he's not sure if that helps or not. Not having any swelling.   Having sharp shooting "hot stick" of pain for 4-5 seconds at a time intermittently in left chest. Sometimes will happen about once a week, sometimes less frequently. Not seeming to be associated with activity or rest. Denies diaphoresis, nausea, palpitations.   Wanting to stop smoking.   He currently lives with: Interim Problems from his last visit: no  Depression Screen done today and results listed below:  Depression screen [facility] 2/9 06/13/2019 01/27/2019 11/24/2018 08/11/2017 08/06/2017  Decreased Interest 0 0 0 0 0  Down, Depressed, Hopeless 0 0 0 0 0  PHQ - 2 Score 0 0 0 0 0  Altered sleeping 2 1 - - -  Tired, decreased energy 1 0 - - -  Change in appetite 1 0 - - -  Feeling bad or failure about yourself  0 0 - - -  Trouble concentrating 0 0 - - -  Moving slowly or fidgety/restless 0 0 - - -  Suicidal thoughts  0 0 - - -  PHQ-9 Score 4 1 - - -  Difficult doing work/chores - Not difficult at all - - -    The patient does not have a history of falls. I did complete a risk assessment for falls. A plan of care for falls was documented.   Past Medical History:  Past Medical History:  Diagnosis Date  . Diabetes mellitus without complication (HCC)    borderline  . Hepatomegaly   . Hyperlipidemia   . Hypertension   . Substance abuse [facility])     Surgical History:  Past Surgical History:  Procedure Laterality Date  . ANGIOPLASTY    . APPENDECTOMY    . BONE MARROW TRANSPLANT Left    ankle.  patient unsure of this but thinks this is what happened  . CARDIAC SURGERY    . CATARACT EXTRACTION W/PHACO Left 11/13/2015   Procedure: CATARACT EXTRACTION PHACO AND INTRAOCULAR LENS PLACEMENT (IOC);  Surgeon: [name], MD;  Location: [facility] ORS;  Service: Ophthalmology;  Laterality: Left;  [place] 1.06 AP% 23.9 CDE 15.81 Fluid pack lot # [id number]  . CATARACT EXTRACTION W/PHACO Right 11/27/2015   Procedure: CATARACT EXTRACTION PHACO AND INTRAOCULAR LENS PLACEMENT (IOC);  Surgeon: [name], MD;  Location: [facility] ORS;  Service: Ophthalmology;  Laterality: Right;  [place] 1.06  AP% 23.8 CDE 15.80 Fluid pack lot # [id number]  . COLONOSCOPY  2012   polyps, diverticulosis , 24yr repeat  . COLONOSCOPY WITH PROPOFOL N/A 07/17/2016   Procedure: COLONOSCOPY WITH PROPOFOL;  Surgeon: [name], MD;  Location: [facility] ENDOSCOPY;  Service: Endoscopy;  Laterality: N/A;  . FRACTURE SURGERY Left 1982   foot, leg.unsure if metal in these parts.possibly replaced ankle  . PERIPHERAL VASCULAR CATHETERIZATION Left 12/27/2015   Procedure: Lower Extremity Angiography;  Surgeon: Algernon Huxley, MD;  Location: [place] [facility];  Service: Cardiovascular;  Laterality: Left;    Medications:  Current Outpatient Medications on File Prior to Visit  Medication Sig  . amLODipine (NORVASC) 10 MG tablet Take 10 mg by mouth daily.  [name]  atorvastatin (LIPITOR) 20 MG tablet Take 1 tablet (20 mg total) by mouth daily.  . clopidogrel (PLAVIX) 75 MG tablet Take 1 tablet (75 mg total) by mouth daily.  [name] glipiZIDE (GLUCOTROL) 5 MG tablet TAKE 1 TABLET (5 MG TOTAL) BY MOUTH 2 (TWO) TIMES DAILY BEFORE A MEAL.  . hydrALAZINE (APRESOLINE) 10 MG tablet Take 1 tablet (10 mg total) by mouth 2 (two) times daily.  [name] lisinopril (ZESTRIL) 40 MG tablet Take 40 mg by mouth daily.  . meloxicam (MOBIC) 15 MG tablet Take 1 tablet (15 mg total) by mouth daily as needed for pain.  . metFORMIN (GLUCOPHAGE) 1000 MG tablet Take 1,000 mg by mouth 2 (two) times daily with a meal.    No current facility-administered medications on file prior to visit.    Allergies:  No Known Allergies  Social History:  Social History   Socioeconomic History  . Marital status: Married    Spouse name: Not on file  . Number of children: Not on file  . Years of education: Not on file  . Highest education level: 9th grade  Occupational History  . Occupation: working full time   Tobacco Use  . Smoking status: Current Every Day Smoker    Packs/day: 1.00    Years: 55.00    Pack years: 55.00    Types: Cigarettes  . Smokeless tobacco: Never Used  Substance and Sexual Activity  . Alcohol use: Yes    Alcohol/week: 28.0 standard drinks    Types: 28 Cans of beer per week    Comment: 4-6 daily beer  . Drug use: No  . Sexual activity: Not on file  Other Topics Concern  . Not on file  Social History Narrative   Works 5 days a week 8 hour days    Social Determinants of Radio broadcast assistant Strain:   . Difficulty of Paying Living Expenses: Not on file  Food Insecurity:   . Worried About Charity fundraiser in the Last Year: Not on file  . Ran Out of Food in the Last Year: Not on file  Transportation Needs:   . Lack of Transportation (Medical): Not on file  . Lack of Transportation (Non-Medical): Not on file  Physical Activity:   . Days of Exercise per  Week: Not on file  . Minutes of Exercise per Session: Not on file  Stress:   . Feeling of Stress : Not on file  Social Connections:   . Frequency of Communication with Friends and Family: Not on file  . Frequency of Social Gatherings with Friends and Family: Not on file  . Attends Religious Services: Not on file  . Active Member of Clubs or Organizations: Not on file  . Attends  Club or Organization Meetings: Not on file  . Marital Status: Not on file  Intimate Partner Violence:   . Fear of Current or Ex-Partner: Not on file  . Emotionally Abused: Not on file  . Physically Abused: Not on file  . Sexually Abused: Not on file   Social History   Tobacco Use  Smoking Status Current Every Day Smoker  . Packs/day: 1.00  . Years: 55.00  . Pack years: 55.00  . Types: Cigarettes  Smokeless Tobacco Never Used   Social History   Substance and Sexual Activity  Alcohol Use Yes  . Alcohol/week: 28.0 standard drinks  . Types: 28 Cans of beer per week   Comment: 4-6 daily beer    Family History:  Family History  Problem Relation Age of Onset  . Cancer Mother   . Heart disease Mother   . Hyperlipidemia Mother   . Heart disease Father   . Hyperlipidemia Father     Past medical history, surgical history, medications, allergies, family history and social history reviewed with patient today and changes made to appropriate areas of the chart.   Review of Systems - General ROS: negative Psychological ROS: negative Ophthalmic ROS: negative ENT ROS: negative Allergy and Immunology ROS: negative Hematological and Lymphatic ROS: negative Endocrine ROS: negative Breast ROS: negative for breast lumps Respiratory ROS: no cough, shortness of breath, or wheezing Cardiovascular ROS: no chest pain or dyspnea on exertion Gastrointestinal ROS: no abdominal pain, change in bowel habits, or black or bloody stools Genito-Urinary ROS: no dysuria, trouble voiding, or hematuria Musculoskeletal ROS:  negative Neurological ROS: no TIA or stroke symptoms Dermatological ROS: negative All other ROS negative except what is listed above and in the HPI.      Objective:    BP (!) 149/63   Pulse (!) 55   Temp 98.2 F (36.8 C) (Oral)   Ht 5\' 10"  (1.778 m)   Wt 190 lb (86.2 kg)   SpO2 97%   BMI 27.26 kg/m   Wt Readings from Last 3 Encounters:  06/13/19 190 lb (86.2 kg)  06/08/19 180 lb (81.6 kg)  01/27/19 183 lb 12.8 oz (83.4 kg)    Physical Exam Vitals and nursing note reviewed.  Constitutional:      General: He is not in acute distress.    Appearance: He is well-developed.  HENT:     Head: Atraumatic.     Right Ear: Tympanic membrane and external ear normal.     Left Ear: Tympanic membrane and external ear normal.     Nose: Nose normal.     Mouth/Throat:     Mouth: Mucous membranes are moist.     Pharynx: Oropharynx is clear.  Eyes:     General: No scleral icterus.    Conjunctiva/sclera: Conjunctivae normal.     Pupils: Pupils are equal, round, and reactive to light.  Cardiovascular:     Rate and Rhythm: Normal rate and regular rhythm.     Heart sounds: Normal heart sounds. No murmur.  Pulmonary:     Effort: Pulmonary effort is normal. No respiratory distress.     Breath sounds: Normal breath sounds.  Abdominal:     General: Bowel sounds are normal. There is no distension.     Palpations: Abdomen is soft. There is no mass.     Tenderness: There is no abdominal tenderness. There is no guarding.  Genitourinary:    Comments: GU exam declined Musculoskeletal:        General: No  tenderness. Normal range of motion.     Cervical back: Normal range of motion and neck supple.  Skin:    General: Skin is warm and dry.     Findings: No rash.  Neurological:     General: No focal deficit present.     Mental Status: He is alert and oriented to person, place, and time.     Deep Tendon Reflexes: Reflexes are normal and symmetric.  Psychiatric:        Mood and Affect: Mood  normal.        Behavior: Behavior normal.        Thought Content: Thought content normal.        Judgment: Judgment normal.    Diabetic Foot Exam - Simple   Simple Foot Form Diabetic Foot exam was performed with the following findings: Yes 06/13/2019  3:19 PM  Visual Inspection No deformities, no ulcerations, no other skin breakdown bilaterally: Yes Sensation Testing Intact to touch and monofilament testing bilaterally: Yes Pulse Check Posterior Tibialis and Dorsalis pulse intact bilaterally: Yes Comments     Results for orders placed or performed in visit on 06/13/19  CBC with Differential/Platelet  Result Value Ref Range   WBC 6.7 3.4 - 10.8 x10E3/uL   RBC 4.50 4.14 - 5.80 x10E6/uL   Hemoglobin 14.0 13.0 - 17.7 g/dL   Hematocrit 41.5 37.5 - 51.0 %   MCV 92 79 - 97 fL   MCH 31.1 26.6 - 33.0 pg   MCHC 33.7 31.5 - 35.7 g/dL   RDW 12.8 11.6 - 15.4 %   Platelets 258 150 - 450 x10E3/uL   Neutrophils 49 Not Estab. %   Lymphs 31 Not Estab. %   Monocytes 10 Not Estab. %   Eos 9 Not Estab. %   Basos 1 Not Estab. %   Neutrophils Absolute 3.3 1.4 - 7.0 x10E3/uL   Lymphocytes Absolute 2.1 0.7 - 3.1 x10E3/uL   Monocytes Absolute 0.7 0.1 - 0.9 x10E3/uL   EOS (ABSOLUTE) 0.6 (H) 0.0 - 0.4 x10E3/uL   Basophils Absolute 0.1 0.0 - 0.2 x10E3/uL   Immature Granulocytes 0 Not Estab. %   Immature Grans (Abs) 0.0 0.0 - 0.1 x10E3/uL  Comprehensive metabolic panel  Result Value Ref Range   Glucose 94 65 - 99 mg/dL   BUN 31 (H) 8 - 27 mg/dL   Creatinine, Ser 1.34 (H) 0.76 - 1.27 mg/dL   GFR calc non Af Amer 52 (L) >59 mL/min/1.73   GFR calc Af Amer 60 >59 mL/min/1.73   BUN/Creatinine Ratio 23 10 - 24   Sodium 138 134 - 144 mmol/L   Potassium 4.9 3.5 - 5.2 mmol/L   Chloride 103 96 - 106 mmol/L   CO2 17 (L) 20 - 29 mmol/L   Calcium 9.9 8.6 - 10.2 mg/dL   Total Protein 6.7 6.0 - 8.5 g/dL   Albumin 4.5 3.7 - 4.7 g/dL   Globulin, Total 2.2 1.5 - 4.5 g/dL   Albumin/Globulin Ratio 2.0 1.2 -  2.2   Bilirubin Total 0.2 0.0 - 1.2 mg/dL   Alkaline Phosphatase 106 39 - 117 IU/L   AST 31 0 - 40 IU/L   ALT 35 0 - 44 IU/L  Lipid Panel w/o Chol/HDL Ratio  Result Value Ref Range   Cholesterol, Total 99 (L) 100 - 199 mg/dL   Triglycerides 204 (H) 0 - 149 mg/dL   HDL 33 (L) >39 mg/dL   VLDL Cholesterol Cal 33 5 - 40 mg/dL  LDL Chol Calc (NIH) 33 0 - 99 mg/dL  UA/M w/rflx Culture, Routine   Specimen: Urine   URINE  Result Value Ref Range   Specific Gravity, UA 1.020 1.005 - 1.030   pH, UA 5.0 5.0 - 7.5   Color, UA Yellow Yellow   Appearance Ur Clear Clear   Leukocytes,UA Negative Negative   Protein,UA Negative Negative/Trace   Glucose, UA Negative Negative   Ketones, UA Negative Negative   RBC, UA Negative Negative   Bilirubin, UA Negative Negative   Urobilinogen, Ur 0.2 0.2 - 1.0 mg/dL   Nitrite, UA Negative Negative  Microalbumin, Urine Waived  Result Value Ref Range   Microalb, Ur Waived 80 (H) 0 - 19 mg/L   Creatinine, Urine Waived 100 10 - 300 mg/dL   Microalb/Creat Ratio 30-300 (H) <30 mg/g  HgB A1c  Result Value Ref Range   Hgb A1c MFr Bld 6.9 (H) 4.8 - 5.6 %   Est. average glucose Bld gHb Est-mCnc 151 mg/dL  PSA  Result Value Ref Range   Prostate Specific Ag, Serum 0.6 0.0 - 4.0 ng/mL      Assessment & Plan:   Problem List Items Addressed This Visit      Cardiovascular and Mediastinum   Type 2 DM with CKD stage 3 and hypertension (HCC) - Primary    Recheck A1C, adjust as needed. Continue current regimen and working on lifestyle modifications      Relevant Medications   chlorthalidone (HYGROTON) 50 MG tablet   metoprolol succinate (TOPROL-XL) 25 MG 24 hr tablet   Other Relevant Orders   Microalbumin, Urine Waived (Completed)   HgB A1c (Completed)   Aortic atherosclerosis (HCC)    Recheck lipids, adjust as needed      Relevant Medications   chlorthalidone (HYGROTON) 50 MG tablet   metoprolol succinate (TOPROL-XL) 25 MG 24 hr tablet   PAD  (peripheral artery disease) (HCC)    Recheck lipids, continue lifestyle modifications      Relevant Medications   chlorthalidone (HYGROTON) 50 MG tablet   metoprolol succinate (TOPROL-XL) 25 MG 24 hr tablet   Other Relevant Orders   Lipid Panel w/o Chol/HDL Ratio (Completed)   Hypertension associated with diabetes (HCC)    BPs still not to goal, but also battling with low HRs. Will decrease metoprolol to 25 mg extended release tab and increase chlorthalidone to 50 mg. Recheck in 1 month. Continue close home monitoring      Relevant Medications   chlorthalidone (HYGROTON) 50 MG tablet   metoprolol succinate (TOPROL-XL) 25 MG 24 hr tablet   Other Relevant Orders   CBC with Differential/Platelet (Completed)   Comprehensive metabolic panel (Completed)   UA/M w/rflx Culture, Routine (Completed)     Respiratory   Emphysema lung (HCC)    Stable off inhalers, continue to monitor        Genitourinary   BPH (benign prostatic hyperplasia)    Recheck PSA, continue to monitor      Relevant Orders   UA/M w/rflx Culture, Routine (Completed)   PSA (Completed)     Other   Hyperlipidemia    Recheck lipids, continue current regimen      Relevant Medications   chlorthalidone (HYGROTON) 50 MG tablet   metoprolol succinate (TOPROL-XL) 25 MG 24 hr tablet    Other Visit Diagnoses    Annual physical exam       Chest pain, unspecified type       EKG today without acute ST or T  wave changes, but given chest pain sxs and significant risk factors will refer to Cardiology for further evaluation   Relevant Orders   EKG 12-Lead (Completed)   Ambulatory referral to Cardiology       Discussed aspirin prophylaxis for myocardial infarction prevention and decision was on plavix  LABORATORY TESTING:  Health maintenance labs ordered today as discussed above.   The natural history of prostate cancer and ongoing controversy regarding screening and potential treatment outcomes of prostate cancer  has been discussed with the patient. The meaning of a false positive PSA and a false negative PSA has been discussed. He indicates understanding of the limitations of this screening test and wishes to proceed with screening PSA testing.   IMMUNIZATIONS:   - Tdap: Tetanus vaccination status reviewed: last tetanus booster within 10 years. - Influenza: Up to date - Pneumovax: Up to date - Prevnar: Up to date - HPV: Not applicable - Zostavax vaccine: Refused  SCREENING: - Colonoscopy: Up to date  Discussed with patient purpose of the colonoscopy is to detect colon cancer at curable precancerous or early stages   PATIENT COUNSELING:    Sexuality: Discussed sexually transmitted diseases, partner selection, use of condoms, avoidance of unintended pregnancy  and contraceptive alternatives.   Advised to avoid cigarette smoking.  I discussed with the patient that most people either abstain from alcohol or drink within safe limits (<=14/week and <=4 drinks/occasion for males, <=7/weeks and <= 3 drinks/occasion for females) and that the risk for alcohol disorders and other health effects rises proportionally with the number of drinks per week and how often a drinker exceeds daily limits.  Discussed cessation/primary prevention of drug use and availability of treatment for abuse.   Diet: Encouraged to adjust caloric intake to maintain  or achieve ideal body weight, to reduce intake of dietary saturated fat and total fat, to limit sodium intake by avoiding high sodium foods and not adding table salt, and to maintain adequate dietary potassium and calcium preferably from fresh fruits, vegetables, and low-fat dairy products.    stressed the importance of regular exercise  Injury prevention: Discussed safety belts, safety helmets, smoke detector, smoking near bedding or upholstery.   Dental health: Discussed importance of regular tooth brushing, flossing, and dental visits.   Follow up plan: NEXT  PREVENTATIVE PHYSICAL DUE IN 1 YEAR. Return in about 2 weeks (around 06/27/2019) for BP, smoking f/u.

## 2019-06-13 NOTE — Patient Instructions (Addendum)
Diclofenac gel for topical pain relief - may be good for your ankles  Start taking 50 mg chlorthalidone rather than your 25 mg tablet you've been on Start taking 25 mg metoprolol rather than then 50 mg tablet you've been on.   Check home blood pressures and heart rates and keep a log of readings to bring to appointments

## 2019-06-14 LAB — COMPREHENSIVE METABOLIC PANEL WITH GFR
ALT: 35 IU/L (ref 0–44)
AST: 31 IU/L (ref 0–40)
Albumin/Globulin Ratio: 2 (ref 1.2–2.2)
Albumin: 4.5 g/dL (ref 3.7–4.7)
Alkaline Phosphatase: 106 IU/L (ref 39–117)
BUN/Creatinine Ratio: 23 (ref 10–24)
BUN: 31 mg/dL — ABNORMAL HIGH (ref 8–27)
Bilirubin Total: 0.2 mg/dL (ref 0.0–1.2)
CO2: 17 mmol/L — ABNORMAL LOW (ref 20–29)
Calcium: 9.9 mg/dL (ref 8.6–10.2)
Chloride: 103 mmol/L (ref 96–106)
Creatinine, Ser: 1.34 mg/dL — ABNORMAL HIGH (ref 0.76–1.27)
GFR calc Af Amer: 60 mL/min/1.73
GFR calc non Af Amer: 52 mL/min/1.73 — ABNORMAL LOW
Globulin, Total: 2.2 g/dL (ref 1.5–4.5)
Glucose: 94 mg/dL (ref 65–99)
Potassium: 4.9 mmol/L (ref 3.5–5.2)
Sodium: 138 mmol/L (ref 134–144)
Total Protein: 6.7 g/dL (ref 6.0–8.5)

## 2019-06-14 LAB — CBC WITH DIFFERENTIAL/PLATELET
Basophils Absolute: 0.1 x10E3/uL (ref 0.0–0.2)
Basos: 1 %
EOS (ABSOLUTE): 0.6 x10E3/uL — ABNORMAL HIGH (ref 0.0–0.4)
Eos: 9 %
Hematocrit: 41.5 % (ref 37.5–51.0)
Hemoglobin: 14 g/dL (ref 13.0–17.7)
Immature Grans (Abs): 0 x10E3/uL (ref 0.0–0.1)
Immature Granulocytes: 0 %
Lymphocytes Absolute: 2.1 x10E3/uL (ref 0.7–3.1)
Lymphs: 31 %
MCH: 31.1 pg (ref 26.6–33.0)
MCHC: 33.7 g/dL (ref 31.5–35.7)
MCV: 92 fL (ref 79–97)
Monocytes Absolute: 0.7 x10E3/uL (ref 0.1–0.9)
Monocytes: 10 %
Neutrophils Absolute: 3.3 x10E3/uL (ref 1.4–7.0)
Neutrophils: 49 %
Platelets: 258 x10E3/uL (ref 150–450)
RBC: 4.5 x10E6/uL (ref 4.14–5.80)
RDW: 12.8 % (ref 11.6–15.4)
WBC: 6.7 x10E3/uL (ref 3.4–10.8)

## 2019-06-14 LAB — LIPID PANEL W/O CHOL/HDL RATIO
Cholesterol, Total: 99 mg/dL — ABNORMAL LOW (ref 100–199)
HDL: 33 mg/dL — ABNORMAL LOW
LDL Chol Calc (NIH): 33 mg/dL (ref 0–99)
Triglycerides: 204 mg/dL — ABNORMAL HIGH (ref 0–149)
VLDL Cholesterol Cal: 33 mg/dL (ref 5–40)

## 2019-06-14 LAB — PSA: Prostate Specific Ag, Serum: 0.6 ng/mL (ref 0.0–4.0)

## 2019-06-14 LAB — HEMOGLOBIN A1C
Est. average glucose Bld gHb Est-mCnc: 151 mg/dL
Hgb A1c MFr Bld: 6.9 % — ABNORMAL HIGH (ref 4.8–5.6)

## 2019-06-20 NOTE — Assessment & Plan Note (Signed)
Recheck A1C, adjust as needed. Continue current regimen and working on lifestyle modifications

## 2019-06-20 NOTE — Assessment & Plan Note (Signed)
Recheck PSA, continue to monitor

## 2019-06-20 NOTE — Assessment & Plan Note (Signed)
Recheck lipids, continue current regimen 

## 2019-06-20 NOTE — Assessment & Plan Note (Signed)
Recheck lipids, adjust as needed 

## 2019-06-20 NOTE — Assessment & Plan Note (Signed)
Stable off inhalers, continue to monitor 

## 2019-06-20 NOTE — Assessment & Plan Note (Signed)
Recheck lipids, continue lifestyle modifications 

## 2019-06-20 NOTE — Assessment & Plan Note (Addendum)
BPs still not to goal, but also battling with low HRs. Will decrease metoprolol to 25 mg extended release tab and increase chlorthalidone to 50 mg. Recheck in 1 month. Continue close home monitoring

## 2019-06-27 ENCOUNTER — Other Ambulatory Visit: Payer: Self-pay

## 2019-06-27 ENCOUNTER — Encounter: Payer: Self-pay | Admitting: Family Medicine

## 2019-06-27 ENCOUNTER — Ambulatory Visit (INDEPENDENT_AMBULATORY_CARE_PROVIDER_SITE_OTHER): Payer: Medicare HMO | Admitting: Family Medicine

## 2019-06-27 VITALS — BP 170/70 | HR 54 | Temp 97.6°F | Ht 70.0 in | Wt 189.0 lb

## 2019-06-27 DIAGNOSIS — I152 Hypertension secondary to endocrine disorders: Secondary | ICD-10-CM

## 2019-06-27 DIAGNOSIS — E1159 Type 2 diabetes mellitus with other circulatory complications: Secondary | ICD-10-CM

## 2019-06-27 DIAGNOSIS — I10 Essential (primary) hypertension: Secondary | ICD-10-CM

## 2019-06-27 MED ORDER — TRIAMCINOLONE ACETONIDE 0.1 % EX CREA: 1.0000 "application " | TOPICAL_CREAM | Freq: Two times a day (BID) | CUTANEOUS | 0 refills | Status: DC

## 2019-06-27 NOTE — Assessment & Plan Note (Signed)
Increase hydralazine to 2 tabs of 10 mg hydralazine BID (pt just got new script and does not want to pay for different dose yet) and continue rest of regimen as is. Monitor home readings closely, f/u with Cardiology as scheduled for recheck

## 2019-06-27 NOTE — Patient Instructions (Signed)
Take 2 tabs of 10 mg hydralazine twice daily and record your home blood pressures. Bring this log to the Cardiologist appointment

## 2019-06-27 NOTE — Progress Notes (Signed)
BP (!) 170/70   Pulse (!) 54   Temp 97.6 F (36.4 C) (Oral)   Ht 5\' 10"  (1.778 m)   Wt 189 lb (85.7 kg)   SpO2 96%   BMI 27.12 kg/m    Subjective:    Patient ID: William Ball, male    DOB: 03-09-1946, 74 y.o.   MRN: [id number]  HPI: William Ball is a 74 y.o. male  Chief Complaint  Patient presents with  . Hypertension  . Nicotine Dependence   Patient presenting today for 2 week HTN f/u after adding hydralazine 10 mg BID to BP regimen. Metoprolol was also reduced to 25 mg due to continued bradycardia and chlorthalidone increased to 50 mg. Tolerating regimen well without side effects. Denies CP, SOB, HAs, dizziness. Also followed by Cardiology, has appt at end of week for f/u. Home BP readings running between 140-160/70s.   Relevant past medical, surgical, family and social history reviewed and updated as indicated. Interim medical history since our last visit reviewed. Allergies and medications reviewed and updated.  Review of Systems  Per HPI unless specifically indicated above     Objective:    BP (!) 170/70   Pulse (!) 54   Temp 97.6 F (36.4 C) (Oral)   Ht 5\' 10"  (1.778 m)   Wt 189 lb (85.7 kg)   SpO2 96%   BMI 27.12 kg/m   Wt Readings from Last 3 Encounters:  06/27/19 189 lb (85.7 kg)  06/13/19 190 lb (86.2 kg)  06/08/19 180 lb (81.6 kg)    Physical Exam Vitals and nursing note reviewed.  Constitutional:      Appearance: Normal appearance.  HENT:     Head: Atraumatic.  Eyes:     Extraocular Movements: Extraocular movements intact.     Conjunctiva/sclera: Conjunctivae normal.  Cardiovascular:     Rate and Rhythm: Regular rhythm. Bradycardia present.  Pulmonary:     Effort: Pulmonary effort is normal.     Breath sounds: Normal breath sounds.  Musculoskeletal:        General: Normal range of motion.     Cervical back: Normal range of motion and neck supple.  Skin:    General: Skin is warm and dry.  Neurological:     General: No focal deficit  present.     Mental Status: He is oriented to person, place, and time.  Psychiatric:        Mood and Affect: Mood normal.        Thought Content: Thought content normal.        Judgment: Judgment normal.     Results for orders placed or performed in visit on 06/13/19  CBC with Differential/Platelet  Result Value Ref Range   WBC 6.7 3.4 - 10.8 x10E3/uL   RBC 4.50 4.14 - 5.80 x10E6/uL   Hemoglobin 14.0 13.0 - 17.7 g/dL   Hematocrit 41.5 37.5 - 51.0 %   MCV 92 79 - 97 fL   MCH 31.1 26.6 - 33.0 pg   MCHC 33.7 31.5 - 35.7 g/dL   RDW 12.8 11.6 - 15.4 %   Platelets 258 150 - 450 x10E3/uL   Neutrophils 49 Not Estab. %   Lymphs 31 Not Estab. %   Monocytes 10 Not Estab. %   Eos 9 Not Estab. %   Basos 1 Not Estab. %   Neutrophils Absolute 3.3 1.4 - 7.0 x10E3/uL   Lymphocytes Absolute 2.1 0.7 - 3.1 x10E3/uL   Monocytes Absolute 0.7 0.1 -  0.9 x10E3/uL   EOS (ABSOLUTE) 0.6 (H) 0.0 - 0.4 x10E3/uL   Basophils Absolute 0.1 0.0 - 0.2 x10E3/uL   Immature Granulocytes 0 Not Estab. %   Immature Grans (Abs) 0.0 0.0 - 0.1 x10E3/uL  Comprehensive metabolic panel  Result Value Ref Range   Glucose 94 65 - 99 mg/dL   BUN 31 (H) 8 - 27 mg/dL   Creatinine, Ser 1.34 (H) 0.76 - 1.27 mg/dL   GFR calc non Af Amer 52 (L) >59 mL/min/1.73   GFR calc Af Amer 60 >59 mL/min/1.73   BUN/Creatinine Ratio 23 10 - 24   Sodium 138 134 - 144 mmol/L   Potassium 4.9 3.5 - 5.2 mmol/L   Chloride 103 96 - 106 mmol/L   CO2 17 (L) 20 - 29 mmol/L   Calcium 9.9 8.6 - 10.2 mg/dL   Total Protein 6.7 6.0 - 8.5 g/dL   Albumin 4.5 3.7 - 4.7 g/dL   Globulin, Total 2.2 1.5 - 4.5 g/dL   Albumin/Globulin Ratio 2.0 1.2 - 2.2   Bilirubin Total 0.2 0.0 - 1.2 mg/dL   Alkaline Phosphatase 106 39 - 117 IU/L   AST 31 0 - 40 IU/L   ALT 35 0 - 44 IU/L  Lipid Panel w/o Chol/HDL Ratio  Result Value Ref Range   Cholesterol, Total 99 (L) 100 - 199 mg/dL   Triglycerides 204 (H) 0 - 149 mg/dL   HDL 33 (L) >39 mg/dL   VLDL Cholesterol  Cal 33 5 - 40 mg/dL   LDL Chol Calc (NIH) 33 0 - 99 mg/dL  UA/M w/rflx Culture, Routine   Specimen: Urine   URINE  Result Value Ref Range   Specific Gravity, UA 1.020 1.005 - 1.030   pH, UA 5.0 5.0 - 7.5   Color, UA Yellow Yellow   Appearance Ur Clear Clear   Leukocytes,UA Negative Negative   Protein,UA Negative Negative/Trace   Glucose, UA Negative Negative   Ketones, UA Negative Negative   RBC, UA Negative Negative   Bilirubin, UA Negative Negative   Urobilinogen, Ur 0.2 0.2 - 1.0 mg/dL   Nitrite, UA Negative Negative  Microalbumin, Urine Waived  Result Value Ref Range   Microalb, Ur Waived 80 (H) 0 - 19 mg/L   Creatinine, Urine Waived 100 10 - 300 mg/dL   Microalb/Creat Ratio 30-300 (H) <30 mg/g  HgB A1c  Result Value Ref Range   Hgb A1c MFr Bld 6.9 (H) 4.8 - 5.6 %   Est. average glucose Bld gHb Est-mCnc 151 mg/dL  PSA  Result Value Ref Range   Prostate Specific Ag, Serum 0.6 0.0 - 4.0 ng/mL      Assessment & Plan:   Problem List Items Addressed This Visit      Cardiovascular and Mediastinum   Hypertension associated with diabetes ([place]) - Primary    Increase hydralazine to 2 tabs of 10 mg hydralazine BID (pt just got new script and does not want to pay for different dose yet) and continue rest of regimen as is. Monitor home readings closely, f/u with Cardiology as scheduled for recheck          Follow up plan: Return in about 3 months (around 09/24/2019) for DM, HTN f/u.

## 2019-06-30 ENCOUNTER — Other Ambulatory Visit: Payer: Self-pay | Admitting: Family Medicine

## 2019-07-01 ENCOUNTER — Other Ambulatory Visit: Payer: Self-pay

## 2019-07-01 ENCOUNTER — Encounter: Payer: Self-pay | Admitting: Cardiology

## 2019-07-01 ENCOUNTER — Ambulatory Visit (INDEPENDENT_AMBULATORY_CARE_PROVIDER_SITE_OTHER): Payer: Medicare HMO | Admitting: Cardiology

## 2019-07-01 VITALS — BP 150/60 | HR 49 | Ht 69.0 in | Wt 187.0 lb

## 2019-07-01 DIAGNOSIS — R079 Chest pain, unspecified: Secondary | ICD-10-CM

## 2019-07-01 DIAGNOSIS — I10 Essential (primary) hypertension: Secondary | ICD-10-CM

## 2019-07-01 DIAGNOSIS — F17200 Nicotine dependence, unspecified, uncomplicated: Secondary | ICD-10-CM

## 2019-07-01 DIAGNOSIS — R69 Illness, unspecified: Secondary | ICD-10-CM | POA: Diagnosis not present

## 2019-07-01 DIAGNOSIS — IMO0001 Reserved for inherently not codable concepts without codable children: Secondary | ICD-10-CM

## 2019-07-01 MED ORDER — HYDRALAZINE HCL 25 MG PO TABS: 25.0000 mg | ORAL_TABLET | Freq: Two times a day (BID) | ORAL | 1 refills | Status: DC

## 2019-07-01 NOTE — Progress Notes (Signed)
Cardiology Office Note:    Date:  07/01/2019   ID:  William Ball, William Ball [birth year], MRN [id number]  PCP:  Guadalupe Maple, MD  Cardiologist:  Kate Sable, MD  Electrophysiologist:  None   Referring MD: Volney American,*   Chief Complaint  Patient presents with  . New Patient (Initial Visit)    referred by PCP for chest pain. Meds reviewed verbally with patient.     History of Present Illness:    William Ball is a 74 y.o. male with a hx of diabetes, hyperlipidemia, hypertension, current smoker x50+ years who presents due to chest pain.  Patient states having left-sided chest pain which is sharp/electric-like in nature occurring over the past 1 to 2 months.  Symptoms are not related with exertion.  He rates pain as a 4 out of 10 in severity.  Pain typically lasts couple of seconds and go away.  He is a current smoker and is working on quitting.  He denies edema, palpitations, syncope.  Past Medical History:  Diagnosis Date  . Diabetes mellitus without complication (HCC)    borderline  . Hepatomegaly   . Hyperlipidemia   . Hypertension   . Substance abuse [facility])     Past Surgical History:  Procedure Laterality Date  . ANGIOPLASTY    . APPENDECTOMY    . BONE MARROW TRANSPLANT Left    ankle.  patient unsure of this but thinks this is what happened  . CARDIAC SURGERY    . CATARACT EXTRACTION W/PHACO Left 11/13/2015   Procedure: CATARACT EXTRACTION PHACO AND INTRAOCULAR LENS PLACEMENT (IOC);  Surgeon: [name], MD;  Location: [facility] ORS;  Service: Ophthalmology;  Laterality: Left;  [place] 1.06 AP% 23.9 CDE 15.81 Fluid pack lot # [id number]  . CATARACT EXTRACTION W/PHACO Right 11/27/2015   Procedure: CATARACT EXTRACTION PHACO AND INTRAOCULAR LENS PLACEMENT (IOC);  Surgeon: [name], MD;  Location: [facility] ORS;  Service: Ophthalmology;  Laterality: Right;  [place] 1.06 AP% 23.8 CDE 15.80 Fluid pack lot # [id number]  . COLONOSCOPY  2012   polyps, diverticulosis , 30yr  repeat  . COLONOSCOPY WITH PROPOFOL N/A 07/17/2016   Procedure: COLONOSCOPY WITH PROPOFOL;  Surgeon: [name], MD;  Location: [facility];  Service: Endoscopy;  Laterality: N/A;  . FRACTURE SURGERY Left 1982   foot, leg.unsure if metal in these parts.possibly replaced ankle  . PERIPHERAL VASCULAR CATHETERIZATION Left 12/27/2015   Procedure: Lower Extremity Angiography;  Surgeon: Algernon Huxley, MD;  Location: [place] [facility];  Service: Cardiovascular;  Laterality: Left;    Current Medications: Current Meds  Medication Sig  . amLODipine (NORVASC) 10 MG tablet Take 10 mg by mouth daily.  [name] atorvastatin (LIPITOR) 20 MG tablet Take 1 tablet (20 mg total) by mouth daily.  . chlorthalidone (HYGROTON) 50 MG tablet Take 1 tablet (50 mg total) by mouth daily.  . clopidogrel (PLAVIX) 75 MG tablet Take 1 tablet (75 mg total) by mouth daily.  [name] glipiZIDE (GLUCOTROL) 5 MG tablet TAKE 1 TABLET (5 MG TOTAL) BY MOUTH 2 (TWO) TIMES DAILY BEFORE A MEAL.  [name] lisinopril (ZESTRIL) 40 MG tablet Take 40 mg by mouth daily.  . meloxicam (MOBIC) 15 MG tablet Take 1 tablet (15 mg total) by mouth daily as needed for pain.  . metFORMIN (GLUCOPHAGE) 1000 MG tablet Take 1,000 mg by mouth 2 (two) times daily with a meal.   . Omega-3 Fatty Acids (FISH OIL PO) Take by mouth daily.  [name] triamcinolone cream (KENALOG) 0.1 %  Apply 1 application topically 2 (two) times daily.  . [DISCONTINUED] hydrALAZINE (APRESOLINE) 10 MG tablet TAKE 1 TABLET BY MOUTH TWICE A DAY  . [DISCONTINUED] metoprolol succinate (TOPROL-XL) 25 MG 24 hr tablet Take 1 tablet (25 mg total) by mouth daily.     Allergies:   Patient has no known allergies.   Social History   Socioeconomic History  . Marital status: Married    Spouse name: Not on file  . Number of children: Not on file  . Years of education: Not on file  . Highest education level: 9th grade  Occupational History  . Occupation: working full time   Tobacco Use  . Smoking status:  Current Every Day Smoker    Packs/day: 1.00    Years: 55.00    Pack years: 55.00    Types: Cigarettes  . Smokeless tobacco: Never Used  Substance and Sexual Activity  . Alcohol use: Yes    Alcohol/week: 28.0 standard drinks    Types: 28 Cans of beer per week    Comment: 4-6 daily beer  . Drug use: No  . Sexual activity: Not on file  Other Topics Concern  . Not on file  Social History Narrative   Works 5 days a week 8 hour days    Social Determinants of Radio broadcast assistant Strain:   . Difficulty of Paying Living Expenses: Not on file  Food Insecurity:   . Worried About Charity fundraiser in the Last Year: Not on file  . Ran Out of Food in the Last Year: Not on file  Transportation Needs:   . Lack of Transportation (Medical): Not on file  . Lack of Transportation (Non-Medical): Not on file  Physical Activity:   . Days of Exercise per Week: Not on file  . Minutes of Exercise per Session: Not on file  Stress:   . Feeling of Stress : Not on file  Social Connections:   . Frequency of Communication with Friends and Family: Not on file  . Frequency of Social Gatherings with Friends and Family: Not on file  . Attends Religious Services: Not on file  . Active Member of Clubs or Organizations: Not on file  . Attends Archivist Meetings: Not on file  . Marital Status: Not on file     Family History: The patient's family history includes Cancer in his mother; Heart disease in his father and mother; Hyperlipidemia in his father and mother.  ROS:   Please see the history of present illness.     All other systems reviewed and are negative.  EKGs/Labs/Other Studies Reviewed:    The following studies were reviewed today:   EKG:  EKG is  ordered today.  The ekg ordered today demonstrates sinus bradycardia, first-degree AV block, heart rate 49  Recent Labs: 11/24/2018: TSH 3.110 06/13/2019: ALT 35; BUN 31; Creatinine, Ser 1.34; Hemoglobin 14.0; Platelets 258;  Potassium 4.9; Sodium 138  Recent Lipid Panel    Component Value Date/Time   CHOL 99 (L) 06/13/2019 1612   CHOL 117 02/18/2018 1528   TRIG 204 (H) 06/13/2019 1612   TRIG 477 (H) 02/18/2018 1528   HDL 33 (L) 06/13/2019 1612   CHOLHDL 3.5 06/12/2016 1502   VLDL 39 (H) 02/09/2017 1527   LDLCALC 33 06/13/2019 1612    Physical Exam:    VS:  BP (!) 150/60 (BP Location: Right Arm, Patient Position: Sitting, Cuff Size: Normal)   Pulse (!) 49   Ht 5'  9" (1.753 m)   Wt 187 lb (84.8 kg)   SpO2 96%   BMI 27.62 kg/m     Wt Readings from Last 3 Encounters:  07/01/19 187 lb (84.8 kg)  06/27/19 189 lb (85.7 kg)  06/13/19 190 lb (86.2 kg)     GEN:  Well nourished, well developed in no acute distress HEENT: Normal NECK: No JVD; No carotid bruits LYMPHATICS: No lymphadenopathy CARDIAC: Bradycardic, regular, no murmurs, rubs, gallops RESPIRATORY:  Clear to auscultation without rales, wheezing or rhonchi  ABDOMEN: Soft, non-tender, non-distended MUSCULOSKELETAL:  No edema; No deformity  SKIN: Warm and dry NEUROLOGIC:  Alert and oriented x 3  PSYCHIATRIC:  Normal affect   ASSESSMENT:    1. Chest pain, unspecified type   2. Essential hypertension   3. Smoking    PLAN:    In order of problems listed above:  1. Patient with atypical chest pain.  He has significant risk factors of long smoking history, hypertension, hyperlipidemia, diabetes.  We will evaluate with an echocardiogram, coronary CT angiogram for presence of CAD. 2. His blood pressure is not well controlled.  Increase hydralazine to 25 mg twice daily.  Okay to stop Toprol since patient is bradycardic with heart rate 49. 3. Patient with long smoking history, smoking cessation advised, over 5 minutes spent counseling patient.  Low up after echocardiogram and coronary CTA.   Medication Adjustments/Labs and Tests Ordered: Current medicines are reviewed at length with the patient today.  Concerns regarding medicines are  outlined above.  Orders Placed This Encounter  Procedures  . CT CORONARY MORPH W/CTA COR W/SCORE W/CA W/CM &/OR WO/CM  . CT CORONARY FRACTIONAL FLOW RESERVE DATA PREP  . CT CORONARY FRACTIONAL FLOW RESERVE FLUID ANALYSIS  . Basic metabolic panel  . EKG 12-Lead  . ECHOCARDIOGRAM COMPLETE   Meds ordered this encounter  Medications  . hydrALAZINE (APRESOLINE) 25 MG tablet    Sig: Take 1 tablet (25 mg total) by mouth 2 (two) times daily.    Dispense:  180 tablet    Refill:  1    Patient Instructions  Medication Instructions:  Your physician has recommended you make the following change in your medication:  1- STOP Toprol XL. 2- INCREASE Hydralazine 25 mg by mouth two times a day.  *If you need a refill on your cardiac medications before your next appointment, please call your pharmacy*   Lab Work: Your physician recommends that you return for lab work once your CT is scheduled BMET. - Please go to the [facility]. You will check in at the front desk to the right as you walk into the atrium. Valet Parking is offered if needed. - No appointment needed. You may go any day between 7 am and 6 pm.  If you have labs (blood work) drawn today and your tests are completely normal, you will receive your results only by: [name] MyChart Message (if you have MyChart) OR . A paper copy in the mail If you have any lab test that is abnormal or we need to change your treatment, we will call you to review the results.   Testing/Procedures: 1-  Your physician has requested that you have an echocardiogram. Echocardiography is a painless test that uses sound waves to create images of your heart. It provides your doctor with information about the size and shape of your heart and how well your heart's chambers and valves are working. This procedure takes approximately one hour. There are no restrictions for  this procedure. You may get an IV, if needed, to receive an ultrasound enhancing agent through to  better visualize your heart.   2-  Your physician has requested that you have cardiac CT. Cardiac computed tomography (CT) is a painless test that uses an x-ray machine to take clear, detailed pictures of your heart. For further information please visit [email or web address]. Please follow instruction sheet as given.  Your cardiac CT will be scheduled at one of the below locations:   [facility] [address] [place], [place] [address] [phone]  [place] [address] [place], [place] [address] [phone]  If scheduled at [facility], please arrive at the [place] main entrance of [facility] 30 minutes prior to test start time. Proceed to the [facility] Radiology Department (first floor) to check-in and test prep.  If scheduled at [facility], please arrive 15 mins early for check-in and test prep.  Please follow these instructions carefully (unless otherwise directed):  Hold all erectile dysfunction medications at least 3 days (72 hrs) prior to test.  On the Night Before the Test: . Be sure to Drink plenty of water. . Do not consume any caffeinated/decaffeinated beverages or chocolate 12 hours prior to your test. . Do not take any antihistamines 12 hours prior to your test. . If you take Metformin do not take 24 hours prior to test.  On the Day of the Test: . Drink plenty of water. Do not drink any water within one hour of the test. . Do not eat any food 4 hours prior to the test. . You may take your regular medications prior to the test.  . HOLD Furosemide/Hydrochlorothiazide morning of the test.      After the Test: . Drink plenty of water. . After receiving IV contrast, you may experience a mild flushed feeling. This is normal. . On occasion, you may experience a mild rash up to 24 hours after the test. This is not dangerous. If this occurs, you can take  Benadryl 25 mg and increase your fluid intake. . If you experience trouble breathing, this can be serious. If it is severe call 911 IMMEDIATELY. If it is mild, please call our office. . If you take any of these medications: Glipizide/Metformin, Avandament, Glucavance, please do not take 48 hours after completing test unless otherwise instructed.   Once we have confirmed authorization from your insurance company, we will call you to set up a date and time for your test.   For non-scheduling related questions, please contact the cardiac imaging nurse navigator should you have any questions/concerns: Marchia Bond, RN Navigator Cardiac Imaging [facility] [phone] mobile  Follow-Up: At [facility], you and your health needs are our priority.  As part of our continuing mission to provide you with exceptional heart care, we have created designated Provider Care Teams.  These Care Teams include your primary Cardiologist (physician) and Advanced Practice Providers (APPs -  Physician Assistants and Nurse Practitioners) who all work together to provide you with the care you need, when you need it.  We recommend signing up for the patient portal called "MyChart".  Sign up information is provided on this After Visit Summary.  MyChart is used to connect with patients for Virtual Visits (Telemedicine).  Patients are able to view lab/test results, encounter notes, upcoming appointments, etc.  Non-urgent messages can be sent to your provider as  well.   To learn more about what you can do with MyChart, go to [email or web address].    Your next appointment:   After testing completed.   The format for your next appointment:   In Person  Provider:   Kate Sable, MD    Cardiac CT Angiogram A cardiac CT angiogram is a procedure to look at the heart and the area around the heart. It may be done to help find the cause of chest pains or other symptoms of heart disease.  During this procedure, a substance called contrast dye is injected into the blood vessels in the area to be checked. A large X-ray machine, called a CT scanner, then takes detailed pictures of the heart and the surrounding area. The procedure is also sometimes called a coronary CT angiogram, coronary artery scanning, or CTA. A cardiac CT angiogram allows the health care provider to see how well blood is flowing to and from the heart. The health care provider will be able to see if there are any problems, such as:  Blockage or narrowing of the coronary arteries in the heart.  Fluid around the heart.  Signs of weakness or disease in the muscles, valves, and tissues of the heart. Tell a health care provider about:  Any allergies you have. This is especially important if you have had a previous allergic reaction to contrast dye.  All medicines you are taking, including vitamins, herbs, eye drops, creams, and over-the-counter medicines.  Any blood disorders you have.  Any surgeries you have had.  Any medical conditions you have.  Whether you are pregnant or may be pregnant.  Any anxiety disorders, chronic pain, or other conditions you have that may increase your stress or prevent you from lying still. What are the risks? Generally, this is a safe procedure. However, problems may occur, including:  Bleeding.  Infection.  Allergic reactions to medicines or dyes.  Damage to other structures or organs.  Kidney damage from the contrast dye that is used.  Increased risk of cancer from radiation exposure. This risk is low. Talk with your health care provider about: ? The risks and benefits of testing. ? How you can receive the lowest dose of radiation. What happens before the procedure?  Wear comfortable clothing and remove any jewelry, glasses, dentures, and hearing aids.  Follow instructions from your health care provider about eating and drinking. This may include: ? For 12 hours  before the procedure -- avoid caffeine. This includes tea, coffee, soda, energy drinks, and diet pills. Drink plenty of water or other fluids that do not have caffeine in them. Being well hydrated can prevent complications. ? For 4-6 hours before the procedure -- stop eating and drinking. The contrast dye can cause nausea, but this is less likely if your stomach is empty.  Ask your health care provider about changing or stopping your regular medicines. This is especially important if you are taking diabetes medicines, blood thinners, or medicines to treat problems with erections (erectile dysfunction). What happens during the procedure?   Hair on your chest may need to be removed so that small sticky patches called electrodes can be placed on your chest. These will transmit information that helps to monitor your heart during the procedure.  An IV will be inserted into one of your veins.  You might be given a medicine to control your heart rate during the procedure. This will help to ensure that good images are obtained.  You will be asked to  lie on an exam table. This table will slide in and out of the CT machine during the procedure.  Contrast dye will be injected into the IV. You might feel warm, or you may get a metallic taste in your mouth.  You will be given a medicine called nitroglycerin. This will relax or dilate the arteries in your heart.  The table that you are lying on will move into the CT machine tunnel for the scan.  The person running the machine will give you instructions while the scans are being done. You may be asked to: ? Keep your arms above your head. ? Hold your breath. ? Stay very still, even if the table is moving.  When the scanning is complete, you will be moved out of the machine.  The IV will be removed. The procedure may vary among health care providers and hospitals. What can I expect after the procedure? After your procedure, it is common to have:  A  metallic taste in your mouth from the contrast dye.  A feeling of warmth.  A headache from the nitroglycerin. Follow these instructions at home:  Take over-the-counter and prescription medicines only as told by your health care provider.  If you are told, drink enough fluid to keep your urine pale yellow. This will help to flush the contrast dye out of your body.  Most people can return to their normal activities right after the procedure. Ask your health care provider what activities are safe for you.  It is up to you to get the results of your procedure. Ask your health care provider, or the department that is doing the procedure, when your results will be ready.  Keep all follow-up visits as told by your health care provider. This is important. Contact a health care provider if:  You have any symptoms of allergy to the contrast dye. These include: ? Shortness of breath. ? Rash or hives. ? A racing heartbeat. Summary  A cardiac CT angiogram is a procedure to look at the heart and the area around the heart. It may be done to help find the cause of chest pains or other symptoms of heart disease.  During this procedure, a large X-ray machine, called a CT scanner, takes detailed pictures of the heart and the surrounding area after a contrast dye has been injected into blood vessels in the area.  Ask your health care provider about changing or stopping your regular medicines before the procedure. This is especially important if you are taking diabetes medicines, blood thinners, or medicines to treat erectile dysfunction.  If you are told, drink enough fluid to keep your urine pale yellow. This will help to flush the contrast dye out of your body. This information is not intended to replace advice given to you by your health care provider. Make sure you discuss any questions you have with your health care provider. Document Revised: 12/15/2018 Document Reviewed: 12/15/2018 Elsevier  Patient Education  [place].   Echocardiogram An echocardiogram is a procedure that uses painless sound waves (ultrasound) to produce an image of the heart. Images from an echocardiogram can provide important information about:  Signs of coronary artery disease (CAD).  Aneurysm detection. An aneurysm is a weak or damaged part of an artery wall that bulges out from the normal force of blood pumping through the body.  Heart size and shape. Changes in the size or shape of the heart can be associated with certain conditions, including heart failure,  aneurysm, and CAD.  Heart muscle function.  Heart valve function.  Signs of a past heart attack.  Fluid buildup around the heart.  Thickening of the heart muscle.  A tumor or infectious growth around the heart valves. Tell a health care provider about:  Any allergies you have.  All medicines you are taking, including vitamins, herbs, eye drops, creams, and over-the-counter medicines.  Any blood disorders you have.  Any surgeries you have had.  Any medical conditions you have.  Whether you are pregnant or may be pregnant. What are the risks? Generally, this is a safe procedure. However, problems may occur, including:  Allergic reaction to dye (contrast) that may be used during the procedure. What happens before the procedure? No specific preparation is needed. You may eat and drink normally. What happens during the procedure?   An IV tube may be inserted into one of your veins.  You may receive contrast through this tube. A contrast is an injection that improves the quality of the pictures from your heart.  A gel will be applied to your chest.  A wand-like tool (transducer) will be moved over your chest. The gel will help to transmit the sound waves from the transducer.  The sound waves will harmlessly bounce off of your heart to allow the heart images to be captured in real-time motion. The images will be  recorded on a computer. The procedure may vary among health care providers and hospitals. What happens after the procedure?  You may return to your normal, everyday life, including diet, activities, and medicines, unless your health care provider tells you not to do that. Summary  An echocardiogram is a procedure that uses painless sound waves (ultrasound) to produce an image of the heart.  Images from an echocardiogram can provide important information about the size and shape of your heart, heart muscle function, heart valve function, and fluid buildup around your heart.  You do not need to do anything to prepare before this procedure. You may eat and drink normally.  After the echocardiogram is completed, you may return to your normal, everyday life, unless your health care provider tells you not to do that. This information is not intended to replace advice given to you by your health care provider. Make sure you discuss any questions you have with your health care provider. Document Revised: 08/12/2018 Document Reviewed: 05/24/2016 Elsevier Patient Education  2020 [place], Kate Sable, MD  07/01/2019 5:28 PM    [place]

## 2019-07-01 NOTE — Patient Instructions (Addendum)
Medication Instructions:  Your physician has recommended you make the following change in your medication:  1- STOP Toprol XL. 2- INCREASE Hydralazine 25 mg by mouth two times a day.  *If you need a refill on your cardiac medications before your next appointment, please call your pharmacy*   Lab Work: Your physician recommends that you return for lab work once your CT is scheduled BMET. - Please go to the [facility]. You will check in at the front desk to the right as you walk into the atrium. Valet Parking is offered if needed. - No appointment needed. You may go any day between 7 am and 6 pm.  If you have labs (blood work) drawn today and your tests are completely normal, you will receive your results only by: [name] MyChart Message (if you have MyChart) OR . A paper copy in the mail If you have any lab test that is abnormal or we need to change your treatment, we will call you to review the results.   Testing/Procedures: 1-  Your physician has requested that you have an echocardiogram. Echocardiography is a painless test that uses sound waves to create images of your heart. It provides your doctor with information about the size and shape of your heart and how well your heart's chambers and valves are working. This procedure takes approximately one hour. There are no restrictions for this procedure. You may get an IV, if needed, to receive an ultrasound enhancing agent through to better visualize your heart.   2-  Your physician has requested that you have cardiac CT. Cardiac computed tomography (CT) is a painless test that uses an x-ray machine to take clear, detailed pictures of your heart. For further information please visit [email or web address]. Please follow instruction sheet as given.  Your cardiac CT will be scheduled at one of the below locations:   [facility] [address] [place], [place] [address] [phone]  [facility] [address] [place], [place] [address] [phone]  If scheduled at [facility], please arrive at the [facility] main entrance of [facility] 30 minutes prior to test start time. Proceed to the [facility] Radiology Department (first floor) to check-in and test prep.  If scheduled at [facility], please arrive 15 mins early for check-in and test prep.  Please follow these instructions carefully (unless otherwise directed):  Hold all erectile dysfunction medications at least 3 days (72 hrs) prior to test.  On the Night Before the Test: . Be sure to Drink plenty of water. . Do not consume any caffeinated/decaffeinated beverages or chocolate 12 hours prior to your test. . Do not take any antihistamines 12 hours prior to your test. . If you take Metformin do not take 24 hours prior to test.  On the Day of the Test: . Drink plenty of water. Do not drink any water within one hour of the test. . Do not eat any food 4 hours prior to the test. . You may take your regular medications prior to the test.  . HOLD Furosemide/Hydrochlorothiazide morning of the test.      After the Test: . Drink plenty of water. . After receiving IV contrast, you may experience a mild flushed feeling. This is normal. . On occasion, you may experience a mild rash up to 24 hours after the test. This is not dangerous. If this occurs, you can take Benadryl 25 mg  and increase your fluid intake. . If you experience trouble breathing, this can be serious. If it is severe call 911 IMMEDIATELY. If it is mild, please call our office. . If you take any of these medications: Glipizide/Metformin, Avandament, Glucavance, please do not take 48 hours after completing test unless otherwise instructed.   Once we have confirmed authorization from your insurance company, we will call you to set up a date and time for your test.   For non-scheduling related  questions, please contact the cardiac imaging nurse navigator should you have any questions/concerns: Marchia Bond, RN Navigator Cardiac Imaging [facility] [phone] mobile  Follow-Up: At [facility], you and your health needs are our priority.  As part of our continuing mission to provide you with exceptional heart care, we have created designated Provider Care Teams.  These Care Teams include your primary Cardiologist (physician) and Advanced Practice Providers (APPs -  Physician Assistants and Nurse Practitioners) who all work together to provide you with the care you need, when you need it.  We recommend signing up for the patient portal called "MyChart".  Sign up information is provided on this After Visit Summary.  MyChart is used to connect with patients for Virtual Visits (Telemedicine).  Patients are able to view lab/test results, encounter notes, upcoming appointments, etc.  Non-urgent messages can be sent to your provider as well.   To learn more about what you can do with MyChart, go to [email or web address].    Your next appointment:   After testing completed.   The format for your next appointment:   In Person  Provider:   Kate Sable, MD    Cardiac CT Angiogram A cardiac CT angiogram is a procedure to look at the heart and the area around the heart. It may be done to help find the cause of chest pains or other symptoms of heart disease. During this procedure, a substance called contrast dye is injected into the blood vessels in the area to be checked. A large X-ray machine, called a CT scanner, then takes detailed pictures of the heart and the surrounding area. The procedure is also sometimes called a coronary CT angiogram, coronary artery scanning, or CTA. A cardiac CT angiogram allows the health care provider to see how well blood is flowing to and from the heart. The health care provider will be able to see if there are any problems,  such as:  Blockage or narrowing of the coronary arteries in the heart.  Fluid around the heart.  Signs of weakness or disease in the muscles, valves, and tissues of the heart. Tell a health care provider about:  Any allergies you have. This is especially important if you have had a previous allergic reaction to contrast dye.  All medicines you are taking, including vitamins, herbs, eye drops, creams, and over-the-counter medicines.  Any blood disorders you have.  Any surgeries you have had.  Any medical conditions you have.  Whether you are pregnant or may be pregnant.  Any anxiety disorders, chronic pain, or other conditions you have that may increase your stress or prevent you from lying still. What are the risks? Generally, this is a safe procedure. However, problems may occur, including:  Bleeding.  Infection.  Allergic reactions to medicines or dyes.  Damage to other structures or organs.  Kidney damage from the contrast dye that is used.  Increased risk of cancer from radiation exposure. This risk is low. Talk with your health care  provider about: ? The risks and benefits of testing. ? How you can receive the lowest dose of radiation. What happens before the procedure?  Wear comfortable clothing and remove any jewelry, glasses, dentures, and hearing aids.  Follow instructions from your health care provider about eating and drinking. This may include: ? For 12 hours before the procedure -- avoid caffeine. This includes tea, coffee, soda, energy drinks, and diet pills. Drink plenty of water or other fluids that do not have caffeine in them. Being well hydrated can prevent complications. ? For 4-6 hours before the procedure -- stop eating and drinking. The contrast dye can cause nausea, but this is less likely if your stomach is empty.  Ask your health care provider about changing or stopping your regular medicines. This is especially important if you are taking  diabetes medicines, blood thinners, or medicines to treat problems with erections (erectile dysfunction). What happens during the procedure?   Hair on your chest may need to be removed so that small sticky patches called electrodes can be placed on your chest. These will transmit information that helps to monitor your heart during the procedure.  An IV will be inserted into one of your veins.  You might be given a medicine to control your heart rate during the procedure. This will help to ensure that good images are obtained.  You will be asked to lie on an exam table. This table will slide in and out of the CT machine during the procedure.  Contrast dye will be injected into the IV. You might feel warm, or you may get a metallic taste in your mouth.  You will be given a medicine called nitroglycerin. This will relax or dilate the arteries in your heart.  The table that you are lying on will move into the CT machine tunnel for the scan.  The person running the machine will give you instructions while the scans are being done. You may be asked to: ? Keep your arms above your head. ? Hold your breath. ? Stay very still, even if the table is moving.  When the scanning is complete, you will be moved out of the machine.  The IV will be removed. The procedure may vary among health care providers and hospitals. What can I expect after the procedure? After your procedure, it is common to have:  A metallic taste in your mouth from the contrast dye.  A feeling of warmth.  A headache from the nitroglycerin. Follow these instructions at home:  Take over-the-counter and prescription medicines only as told by your health care provider.  If you are told, drink enough fluid to keep your urine pale yellow. This will help to flush the contrast dye out of your body.  Most people can return to their normal activities right after the procedure. Ask your health care provider what activities are  safe for you.  It is up to you to get the results of your procedure. Ask your health care provider, or the department that is doing the procedure, when your results will be ready.  Keep all follow-up visits as told by your health care provider. This is important. Contact a health care provider if:  You have any symptoms of allergy to the contrast dye. These include: ? Shortness of breath. ? Rash or hives. ? A racing heartbeat. Summary  A cardiac CT angiogram is a procedure to look at the heart and the area around the heart. It may be done to  help find the cause of chest pains or other symptoms of heart disease.  During this procedure, a large X-ray machine, called a CT scanner, takes detailed pictures of the heart and the surrounding area after a contrast dye has been injected into blood vessels in the area.  Ask your health care provider about changing or stopping your regular medicines before the procedure. This is especially important if you are taking diabetes medicines, blood thinners, or medicines to treat erectile dysfunction.  If you are told, drink enough fluid to keep your urine pale yellow. This will help to flush the contrast dye out of your body. This information is not intended to replace advice given to you by your health care provider. Make sure you discuss any questions you have with your health care provider. Document Revised: 12/15/2018 Document Reviewed: 12/15/2018 Elsevier Patient Education  [place].   Echocardiogram An echocardiogram is a procedure that uses painless sound waves (ultrasound) to produce an image of the heart. Images from an echocardiogram can provide important information about:  Signs of coronary artery disease (CAD).  Aneurysm detection. An aneurysm is a weak or damaged part of an artery wall that bulges out from the normal force of blood pumping through the body.  Heart size and shape. Changes in the size or shape of the heart can be  associated with certain conditions, including heart failure, aneurysm, and CAD.  Heart muscle function.  Heart valve function.  Signs of a past heart attack.  Fluid buildup around the heart.  Thickening of the heart muscle.  A tumor or infectious growth around the heart valves. Tell a health care provider about:  Any allergies you have.  All medicines you are taking, including vitamins, herbs, eye drops, creams, and over-the-counter medicines.  Any blood disorders you have.  Any surgeries you have had.  Any medical conditions you have.  Whether you are pregnant or may be pregnant. What are the risks? Generally, this is a safe procedure. However, problems may occur, including:  Allergic reaction to dye (contrast) that may be used during the procedure. What happens before the procedure? No specific preparation is needed. You may eat and drink normally. What happens during the procedure?   An IV tube may be inserted into one of your veins.  You may receive contrast through this tube. A contrast is an injection that improves the quality of the pictures from your heart.  A gel will be applied to your chest.  A wand-like tool (transducer) will be moved over your chest. The gel will help to transmit the sound waves from the transducer.  The sound waves will harmlessly bounce off of your heart to allow the heart images to be captured in real-time motion. The images will be recorded on a computer. The procedure may vary among health care providers and hospitals. What happens after the procedure?  You may return to your normal, everyday life, including diet, activities, and medicines, unless your health care provider tells you not to do that. Summary  An echocardiogram is a procedure that uses painless sound waves (ultrasound) to produce an image of the heart.  Images from an echocardiogram can provide important information about the size and shape of your heart, heart  muscle function, heart valve function, and fluid buildup around your heart.  You do not need to do anything to prepare before this procedure. You may eat and drink normally.  After the echocardiogram is completed, you may return to your normal,  everyday life, unless your health care provider tells you not to do that. This information is not intended to replace advice given to you by your health care provider. Make sure you discuss any questions you have with your health care provider. Document Revised: 08/12/2018 Document Reviewed: 05/24/2016 Elsevier Patient Education  [place].

## 2019-07-06 ENCOUNTER — Other Ambulatory Visit: Payer: Self-pay | Admitting: Family Medicine

## 2019-07-18 ENCOUNTER — Other Ambulatory Visit: Payer: Self-pay | Admitting: Family Medicine

## 2019-07-27 ENCOUNTER — Encounter: Payer: Medicare HMO | Admitting: Family Medicine

## 2019-08-03 ENCOUNTER — Telehealth (HOSPITAL_COMMUNITY): Payer: Self-pay | Admitting: Emergency Medicine

## 2019-08-03 NOTE — Telephone Encounter (Signed)
Reaching out to patient to offer assistance regarding upcoming cardiac imaging study; pt verbalizes understanding of appt date/time, parking situation and where to check in, pre-test NPO status and medications ordered, and verified current allergies; name and call back number provided for further questions should they arise Cloris Flippo RN Navigator Cardiac Imaging [facility] [phone] office [phone] cell 

## 2019-08-04 ENCOUNTER — Other Ambulatory Visit: Payer: Self-pay

## 2019-08-04 ENCOUNTER — Ambulatory Visit
Admission: RE | Admit: 2019-08-04 | Discharge: 2019-08-04 | Disposition: A | Discharge status: Home / Self Care | Payer: Medicare HMO | Source: Ambulatory Visit | Attending: Cardiology | Admitting: Cardiology

## 2019-08-04 DIAGNOSIS — I700 Atherosclerosis of aorta: Secondary | ICD-10-CM | POA: Insufficient documentation

## 2019-08-04 DIAGNOSIS — R079 Chest pain, unspecified: Secondary | ICD-10-CM | POA: Insufficient documentation

## 2019-08-04 DIAGNOSIS — J439 Emphysema, unspecified: Secondary | ICD-10-CM | POA: Insufficient documentation

## 2019-08-04 LAB — POCT I-STAT CREATININE: Creatinine, Ser: 1.3 mg/dL — ABNORMAL HIGH (ref 0.61–1.24)

## 2019-08-04 MED ORDER — NITROGLYCERIN 0.4 MG SL SUBL
0.8000 mg | SUBLINGUAL_TABLET | Freq: Once | SUBLINGUAL | Status: AC
  Administered 2019-08-04: 0.8 mg via SUBLINGUAL

## 2019-08-04 MED ORDER — IOHEXOL 350 MG/ML SOLN
85.0000 mL | Freq: Once | INTRAVENOUS | Status: AC | PRN
  Administered 2019-08-04: 85 mL via INTRAVENOUS

## 2019-08-04 MED ORDER — METOPROLOL TARTRATE 5 MG/5ML IV SOLN
10.0000 mg | Freq: Once | INTRAVENOUS | Status: AC
  Administered 2019-08-04: 11:00:00 10 mg via INTRAVENOUS

## 2019-08-04 MED ORDER — METOPROLOL TARTRATE 5 MG/5ML IV SOLN
5.0000 mg | Freq: Once | INTRAVENOUS | Status: AC
  Administered 2019-08-04: 11:00:00 5 mg via INTRAVENOUS

## 2019-08-04 NOTE — Progress Notes (Signed)
Patient tolerated CT well. Drank water after. Ambulated to exit steady gait.  

## 2019-08-05 ENCOUNTER — Telehealth: Payer: Self-pay

## 2019-08-05 DIAGNOSIS — I700 Atherosclerosis of aorta: Secondary | ICD-10-CM | POA: Diagnosis not present

## 2019-08-05 NOTE — Telephone Encounter (Signed)
Patient made aware of Cor CTA results and Dr. Thereasa Solo recommendation with verbalized understanding. Patient voiced appreciation for the call.

## 2019-08-05 NOTE — Telephone Encounter (Signed)
-----   Message from Kate Sable, MD sent at 08/05/2019  9:30 AM EDT ----- Severely elevated coronary calcium score of 702.  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  Continue aspirin and Lipitor as prescribed.  Keep follow-up appointment.

## 2019-08-06 DIAGNOSIS — J439 Emphysema, unspecified: Secondary | ICD-10-CM | POA: Diagnosis not present

## 2019-08-06 DIAGNOSIS — L309 Dermatitis, unspecified: Secondary | ICD-10-CM | POA: Diagnosis not present

## 2019-08-06 DIAGNOSIS — G8929 Other chronic pain: Secondary | ICD-10-CM | POA: Diagnosis not present

## 2019-08-06 DIAGNOSIS — R69 Illness, unspecified: Secondary | ICD-10-CM | POA: Diagnosis not present

## 2019-08-06 DIAGNOSIS — E785 Hyperlipidemia, unspecified: Secondary | ICD-10-CM | POA: Diagnosis not present

## 2019-08-06 DIAGNOSIS — I10 Essential (primary) hypertension: Secondary | ICD-10-CM | POA: Diagnosis not present

## 2019-08-06 DIAGNOSIS — I951 Orthostatic hypotension: Secondary | ICD-10-CM | POA: Diagnosis not present

## 2019-08-06 DIAGNOSIS — E1151 Type 2 diabetes mellitus with diabetic peripheral angiopathy without gangrene: Secondary | ICD-10-CM | POA: Diagnosis not present

## 2019-08-06 DIAGNOSIS — I2782 Chronic pulmonary embolism: Secondary | ICD-10-CM | POA: Diagnosis not present

## 2019-08-06 DIAGNOSIS — Z008 Encounter for other general examination: Secondary | ICD-10-CM | POA: Diagnosis not present

## 2019-08-06 DIAGNOSIS — M1990 Unspecified osteoarthritis, unspecified site: Secondary | ICD-10-CM | POA: Diagnosis not present

## 2019-08-08 ENCOUNTER — Telehealth: Payer: Self-pay

## 2019-08-08 NOTE — Telephone Encounter (Signed)
Fax from [facility].  States patient is taking non formulary medication. States that patient is taking Metformin ER and what is approved is standard Metformin.  In patient's chart, I don't see Metformin ER. Is the medication patient's taking an ER medication?

## 2019-08-09 ENCOUNTER — Telehealth: Payer: Self-pay | Admitting: *Deleted

## 2019-08-09 NOTE — Telephone Encounter (Signed)
Do I need to change it to regular release or what is insurance wanting? Appeal?

## 2019-08-09 NOTE — Telephone Encounter (Signed)
error 

## 2019-08-09 NOTE — Telephone Encounter (Signed)
Patient's bottle says Metformin HCL ER 500mg 

## 2019-08-09 NOTE — Telephone Encounter (Signed)
Please check with patient, looks like Dr. Jeananne Rama used to write the ER formulation last year but nothing's been written since then so I'm not sure if he's still taking that formulation

## 2019-08-10 NOTE — Telephone Encounter (Signed)
Insurance covers regular release.  Not ER.   You can change medication to regular release or I can attempt PA for ER.

## 2019-08-11 NOTE — Telephone Encounter (Signed)
I'm assuming he's had GI issues with regular release but please check with patient on that since he's been on the extended release as far back as his record goes. If this is the case, please do a PA on it

## 2019-08-12 NOTE — Telephone Encounter (Signed)
LVM for patient to return phone call.  

## 2019-08-16 ENCOUNTER — Other Ambulatory Visit: Payer: Medicare HMO

## 2019-08-17 ENCOUNTER — Other Ambulatory Visit: Payer: Self-pay

## 2019-08-17 ENCOUNTER — Ambulatory Visit (INDEPENDENT_AMBULATORY_CARE_PROVIDER_SITE_OTHER): Payer: Medicare HMO

## 2019-08-17 ENCOUNTER — Telehealth: Payer: Self-pay | Admitting: *Deleted

## 2019-08-17 DIAGNOSIS — R079 Chest pain, unspecified: Secondary | ICD-10-CM | POA: Diagnosis not present

## 2019-08-17 NOTE — Telephone Encounter (Signed)
Patient here for echo this morning and dropped off BP readings for Dr William Ball to review.   4/1 132/63  60 4/2 131/65  74 4/3 145/71  72 4/4 150/66  64 4/5 157/63  67 4/6 147/94  67 4/7 134/61  69 4/8 140/69  61 4/9 145/64  66 4/10 123/60  63 4/11 149/68  68 4/12 140/59  62 4/13 145/69  65 4/14 152/67  65  Called patient. These BP's were taken in the morning prior to taking morning BP medications. Advised that the readings look stable especially since prior to BP meds. Advised patient to take BP about 2 hours after the morning BP medications and record those over the next 2 weeks leading up to his next appointment on 09/02/19. He verbalized understanding.  Routing to Dr. Garen Ball to make sure he agrees with plan of care.

## 2019-08-18 NOTE — Telephone Encounter (Signed)
William Sable, MD  You 14 hours ago (5:11 PM)   Agree with plan. Thank you   Routing comment

## 2019-08-18 NOTE — Telephone Encounter (Signed)
Hard to say what that could be from with this amount of information, will discuss further at his OV

## 2019-08-18 NOTE — Telephone Encounter (Signed)
Go in touch with patient. Patient states he's been on Metformin ER for as long as he remembers. Doesn't recall having side effects to normal ER.  Patient stated that in the mornings after he takes his medicine he feels sick and will be freezing cold. Patient doesn't know if it's from the Amlodipine or Metformin.  Routing to provider to advise.

## 2019-08-19 NOTE — Telephone Encounter (Signed)
Tried calling patient, someone answered and then hung up. Will try again later on.

## 2019-08-19 NOTE — Telephone Encounter (Signed)
Patient notified and verbalized understanding. 

## 2019-08-26 DIAGNOSIS — H11121 Conjunctival concretions, right eye: Secondary | ICD-10-CM | POA: Diagnosis not present

## 2019-09-02 ENCOUNTER — Ambulatory Visit (INDEPENDENT_AMBULATORY_CARE_PROVIDER_SITE_OTHER): Payer: Medicare HMO | Admitting: Cardiology

## 2019-09-02 ENCOUNTER — Encounter: Payer: Self-pay | Admitting: Cardiology

## 2019-09-02 ENCOUNTER — Other Ambulatory Visit: Payer: Self-pay

## 2019-09-02 VITALS — BP 154/70 | HR 74 | Ht 69.0 in | Wt 182.1 lb

## 2019-09-02 DIAGNOSIS — I2510 Atherosclerotic heart disease of native coronary artery without angina pectoris: Secondary | ICD-10-CM | POA: Diagnosis not present

## 2019-09-02 DIAGNOSIS — I10 Essential (primary) hypertension: Secondary | ICD-10-CM

## 2019-09-02 DIAGNOSIS — IMO0001 Reserved for inherently not codable concepts without codable children: Secondary | ICD-10-CM

## 2019-09-02 DIAGNOSIS — F17200 Nicotine dependence, unspecified, uncomplicated: Secondary | ICD-10-CM | POA: Diagnosis not present

## 2019-09-02 DIAGNOSIS — R69 Illness, unspecified: Secondary | ICD-10-CM | POA: Diagnosis not present

## 2019-09-02 MED ORDER — HYDRALAZINE HCL 50 MG PO TABS: 50.0000 mg | ORAL_TABLET | Freq: Two times a day (BID) | ORAL | 6 refills | Status: DC

## 2019-09-02 NOTE — Patient Instructions (Signed)
Medication Instructions:  - Your physician has recommended you make the following change in your medication:   1) INCREASE hydralazine to 50 mg- take 1 tablet by mouth twice daily   *If you need a refill on your cardiac medications before your next appointment, please call your pharmacy*   Lab Work: - none ordered  If you have labs (blood work) drawn today and your tests are completely normal, you will receive your results only by: [name] MyChart Message (if you have MyChart) OR . A paper copy in the mail If you have any lab test that is abnormal or we need to change your treatment, we will call you to review the results.   Testing/Procedures: - none ordered   Follow-Up: At [facility], you and your health needs are our priority.  As part of our continuing mission to provide you with exceptional heart care, we have created designated Provider Care Teams.  These Care Teams include your primary Cardiologist (physician) and Advanced Practice Providers (APPs -  Physician Assistants and Nurse Practitioners) who all work together to provide you with the care you need, when you need it.  We recommend signing up for the patient portal called "MyChart".  Sign up information is provided on this After Visit Summary.  MyChart is used to connect with patients for Virtual Visits (Telemedicine).  Patients are able to view lab/test results, encounter notes, upcoming appointments, etc.  Non-urgent messages can be sent to your provider as well.   To learn more about what you can do with MyChart, go to [email or web address].    Your next appointment:   3 month(s)  The format for your next appointment:   In Person  Provider:   Kate Sable, MD   Other Instructions n/a

## 2019-09-02 NOTE — Progress Notes (Signed)
Cardiology Office Note:    Date:  09/02/2019   ID:  William Ball, DOB 01-19-1946, MRN [id number]  PCP:  Volney American, PA-C  Cardiologist:  Kate Sable, MD  Electrophysiologist:  None   Referring MD: Guadalupe Maple, MD   Chief Complaint  Patient presents with  . office visit    F/U after cardiac testing; Meds verbally reviewed with patient.    History of Present Illness:    William Ball is a 74 y.o. male with a hx of diabetes, hyperlipidemia, hypertension, current smoker x50+ years who presents for follow-up.  He was last seen due to chest pain.  Due to cardiac risk factors, echocardiogram and coronary CTA was ordered.  He presents for results.  His blood pressure readings at home is still elevated but improved.  His blood pressure medications were titrated after last visit.  He was started on hydralazine 25 mg twice daily.  He is taking hydralazine without any side effects.   Past Medical History:  Diagnosis Date  . Diabetes mellitus without complication (HCC)    borderline  . Hepatomegaly   . Hyperlipidemia   . Hypertension   . Substance abuse [facility])     Past Surgical History:  Procedure Laterality Date  . ANGIOPLASTY    . APPENDECTOMY    . BONE MARROW TRANSPLANT Left    ankle.  patient unsure of this but thinks this is what happened  . CARDIAC SURGERY    . CATARACT EXTRACTION W/PHACO Left 11/13/2015   Procedure: CATARACT EXTRACTION PHACO AND INTRAOCULAR LENS PLACEMENT (IOC);  Surgeon: [name], MD;  Location: [facility] ORS;  Service: Ophthalmology;  Laterality: Left;  [place] 1.06 AP% 23.9 CDE 15.81 Fluid pack lot # [id number]  . CATARACT EXTRACTION W/PHACO Right 11/27/2015   Procedure: CATARACT EXTRACTION PHACO AND INTRAOCULAR LENS PLACEMENT (IOC);  Surgeon: [name], MD;  Location: [facility] ORS;  Service: Ophthalmology;  Laterality: Right;  [place] 1.06 AP% 23.8 CDE 15.80 Fluid pack lot # [id number]  . COLONOSCOPY  2012   polyps, diverticulosis , 30yr  repeat  . COLONOSCOPY WITH PROPOFOL N/A 07/17/2016   Procedure: COLONOSCOPY WITH PROPOFOL;  Surgeon: [name], MD;  Location: [facility] ENDOSCOPY;  Service: Endoscopy;  Laterality: N/A;  . FRACTURE SURGERY Left 1982   foot, leg.unsure if metal in these parts.possibly replaced ankle  . PERIPHERAL VASCULAR CATHETERIZATION Left 12/27/2015   Procedure: Lower Extremity Angiography;  Surgeon: Algernon Huxley, MD;  Location: [place] CV LAB;  Service: Cardiovascular;  Laterality: Left;    Current Medications: Current Meds  Medication Sig  . amLODipine (NORVASC) 10 MG tablet Take 10 mg by mouth daily.  [name] aspirin EC 81 MG tablet Take 81 mg by mouth daily.  [name] atorvastatin (LIPITOR) 20 MG tablet Take 1 tablet (20 mg total) by mouth daily.  . chlorthalidone (HYGROTON) 50 MG tablet TAKE 1 TABLET BY MOUTH EVERY DAY  . clopidogrel (PLAVIX) 75 MG tablet Take 1 tablet (75 mg total) by mouth daily.  [name] glipiZIDE (GLUCOTROL) 5 MG tablet TAKE 1 TABLET (5 MG TOTAL) BY MOUTH 2 (TWO) TIMES DAILY BEFORE A MEAL. (Patient taking differently: Take 5 mg by mouth daily before breakfast. )  . lisinopril (ZESTRIL) 40 MG tablet Take 40 mg by mouth daily.  . meloxicam (MOBIC) 15 MG tablet Take 1 tablet (15 mg total) by mouth daily as needed for pain.  . metFORMIN (GLUCOPHAGE) 1000 MG tablet Take 1,000 mg by mouth daily with breakfast.   . Omega-3  Fatty Acids (FISH OIL PO) Take by mouth daily.  [name] triamcinolone cream (KENALOG) 0.1 % Apply 1 application topically 2 (two) times daily.  . vitamin B-12 (CYANOCOBALAMIN) 500 MCG tablet Take 500 mcg by mouth daily.  . [DISCONTINUED] hydrALAZINE (APRESOLINE) 25 MG tablet Take 1 tablet (25 mg total) by mouth 2 (two) times daily.     Allergies:   Patient has no known allergies.   Social History   Socioeconomic History  . Marital status: Married    Spouse name: Not on file  . Number of children: Not on file  . Years of education: Not on file  . Highest education level: 9th grade    Occupational History  . Occupation: working full time   Tobacco Use  . Smoking status: Current Every Day Smoker    Packs/day: 1.00    Years: 55.00    Pack years: 55.00    Types: Cigarettes  . Smokeless tobacco: Never Used  Substance and Sexual Activity  . Alcohol use: Yes    Alcohol/week: 28.0 standard drinks    Types: 28 Cans of beer per week    Comment: 4-6 daily beer  . Drug use: No  . Sexual activity: Not on file  Other Topics Concern  . Not on file  Social History Narrative   Retired   Investment banker, operational of Radio broadcast assistant Strain:   . Difficulty of Paying Living Expenses:   Food Insecurity:   . Worried About Charity fundraiser in the Last Year:   . Arboriculturist in the Last Year:   Transportation Needs:   . Film/video editor (Medical):   [name] Lack of Transportation (Non-Medical):   Physical Activity:   . Days of Exercise per Week:   . Minutes of Exercise per Session:   Stress:   . Feeling of Stress :   Social Connections:   . Frequency of Communication with Friends and Family:   . Frequency of Social Gatherings with Friends and Family:   . Attends Religious Services:   . Active Member of Clubs or Organizations:   . Attends Archivist Meetings:   [name] Marital Status:      Family History: The patient's family history includes Cancer in his mother; Heart disease in his father and mother; Hyperlipidemia in his father and mother.  ROS:   Please see the history of present illness.     All other systems reviewed and are negative.  EKGs/Labs/Other Studies Reviewed:    The following studies were reviewed today:   EKG:  EKG is  ordered today.  The ekg ordered today demonstrates normal sinus rhythm, cannot rule out anterior infarct.  Recent Labs: 11/24/2018: TSH 3.110 06/13/2019: ALT 35; BUN 31; Hemoglobin 14.0; Platelets 258; Potassium 4.9; Sodium 138 08/04/2019: Creatinine, Ser 1.30  Recent Lipid Panel    Component Value Date/Time    CHOL 99 (L) 06/13/2019 1612   CHOL 117 02/18/2018 1528   TRIG 204 (H) 06/13/2019 1612   TRIG 477 (H) 02/18/2018 1528   HDL 33 (L) 06/13/2019 1612   CHOLHDL 3.5 06/12/2016 1502   VLDL 39 (H) 02/09/2017 1527   LDLCALC 33 06/13/2019 1612    Physical Exam:    VS:  BP (!) 154/70 (BP Location: Left Arm, Patient Position: Sitting, Cuff Size: Normal)   Pulse 74   Ht 5\' 9"  (1.753 m)   Wt 182 lb 2 oz (82.6 kg)   SpO2 95%   BMI 26.90 kg/m  Wt Readings from Last 3 Encounters:  09/02/19 182 lb 2 oz (82.6 kg)  07/01/19 187 lb (84.8 kg)  06/27/19 189 lb (85.7 kg)     GEN:  Well nourished, well developed in no acute distress HEENT: Normal NECK: No JVD; No carotid bruits LYMPHATICS: No lymphadenopathy CARDIAC: Bradycardic, regular, no murmurs, rubs, gallops RESPIRATORY:  Clear to auscultation without rales, wheezing or rhonchi  ABDOMEN: Soft, non-tender, non-distended MUSCULOSKELETAL:  No edema; No deformity  SKIN: Warm and dry NEUROLOGIC:  Alert and oriented x 3  PSYCHIATRIC:  Normal affect   ASSESSMENT:    1. Coronary artery disease involving native coronary artery of native heart without angina pectoris   2. Essential hypertension   3. Smoking    PLAN:    In order of problems listed above:  1. Patient with atypical chest pain.  He has significant risk factors of long smoking history, hypertension, hyperlipidemia, diabetes.  Echocardiogram shows normal systolic and diastolic function, mild LVH likely due to hypertension.  EF 60 to 65%.  Coronary CTA showed a coronary calcium score of 702, 73rd percentile for age, calcified plaque in the proximal RCA and mid LAD causing moderate stenosis, mild stenosis in the left circumflex.  CT FFR did not show any significant stenosis.  Continue aspirin and Lipitor as prescribed.  LDL at goal of below 100 2. History of hypertension his blood pressure is not well controlled.  Increase hydralazine to 50 mg twice daily.  3. Current smoker,  smoking cessation advised,   Follow-up in 3 months  Medication Adjustments/Labs and Tests Ordered: Current medicines are reviewed at length with the patient today.  Concerns regarding medicines are outlined above.  Orders Placed This Encounter  Procedures  . EKG 12-Lead   Meds ordered this encounter  Medications  . hydrALAZINE (APRESOLINE) 50 MG tablet    Sig: Take 1 tablet (50 mg total) by mouth 2 (two) times daily.    Dispense:  60 tablet    Refill:  6    Dose Increase    Patient Instructions  Medication Instructions:  - Your physician has recommended you make the following change in your medication:   1) INCREASE hydralazine to 50 mg- take 1 tablet by mouth twice daily   *If you need a refill on your cardiac medications before your next appointment, please call your pharmacy*   Lab Work: - none ordered  If you have labs (blood work) drawn today and your tests are completely normal, you will receive your results only by: [name] MyChart Message (if you have MyChart) OR . A paper copy in the mail If you have any lab test that is abnormal or we need to change your treatment, we will call you to review the results.   Testing/Procedures: - none ordered   Follow-Up: At [facility], you and your health needs are our priority.  As part of our continuing mission to provide you with exceptional heart care, we have created designated Provider Care Teams.  These Care Teams include your primary Cardiologist (physician) and Advanced Practice Providers (APPs -  Physician Assistants and Nurse Practitioners) who all work together to provide you with the care you need, when you need it.  We recommend signing up for the patient portal called "MyChart".  Sign up information is provided on this After Visit Summary.  MyChart is used to connect with patients for Virtual Visits (Telemedicine).  Patients are able to view lab/test results, encounter notes, upcoming appointments, etc.  Non-urgent  messages can be sent to your provider as well.   To learn more about what you can do with MyChart, go to [email or web address].    Your next appointment:   3 month(s)  The format for your next appointment:   In Person  Provider:   Kate Sable, MD   Other Instructions n/a     Signed, Kate Sable, MD  09/02/2019 5:11 PM    [place]

## 2019-09-26 ENCOUNTER — Encounter: Payer: Self-pay | Admitting: Family Medicine

## 2019-09-26 ENCOUNTER — Other Ambulatory Visit: Payer: Self-pay

## 2019-09-26 ENCOUNTER — Ambulatory Visit (INDEPENDENT_AMBULATORY_CARE_PROVIDER_SITE_OTHER): Payer: Medicare HMO | Admitting: Family Medicine

## 2019-09-26 VITALS — BP 168/68 | HR 77 | Temp 98.1°F | Wt 183.0 lb

## 2019-09-26 DIAGNOSIS — E785 Hyperlipidemia, unspecified: Secondary | ICD-10-CM | POA: Diagnosis not present

## 2019-09-26 DIAGNOSIS — I10 Essential (primary) hypertension: Secondary | ICD-10-CM | POA: Diagnosis not present

## 2019-09-26 DIAGNOSIS — I129 Hypertensive chronic kidney disease with stage 1 through stage 4 chronic kidney disease, or unspecified chronic kidney disease: Secondary | ICD-10-CM

## 2019-09-26 DIAGNOSIS — H26491 Other secondary cataract, right eye: Secondary | ICD-10-CM | POA: Diagnosis not present

## 2019-09-26 DIAGNOSIS — N183 Chronic kidney disease, stage 3 unspecified: Secondary | ICD-10-CM | POA: Diagnosis not present

## 2019-09-26 DIAGNOSIS — E1122 Type 2 diabetes mellitus with diabetic chronic kidney disease: Secondary | ICD-10-CM

## 2019-09-26 DIAGNOSIS — E119 Type 2 diabetes mellitus without complications: Secondary | ICD-10-CM | POA: Diagnosis not present

## 2019-09-26 DIAGNOSIS — E1159 Type 2 diabetes mellitus with other circulatory complications: Secondary | ICD-10-CM

## 2019-09-26 DIAGNOSIS — I152 Hypertension secondary to endocrine disorders: Secondary | ICD-10-CM

## 2019-09-26 LAB — HM DIABETES EYE EXAM

## 2019-09-26 MED ORDER — HYDRALAZINE HCL 50 MG PO TABS: 50.0000 mg | ORAL_TABLET | Freq: Three times a day (TID) | ORAL | 2 refills | Status: DC

## 2019-09-26 MED ORDER — METFORMIN HCL 1000 MG PO TABS: 1000.0000 mg | ORAL_TABLET | Freq: Two times a day (BID) | ORAL | 1 refills | Status: DC

## 2019-09-26 MED ORDER — GLIPIZIDE 5 MG PO TABS: 5.0000 mg | ORAL_TABLET | Freq: Two times a day (BID) | ORAL | 0 refills | Status: DC

## 2019-09-26 NOTE — Progress Notes (Signed)
BP (!) 168/68   Pulse 77   Temp 98.1 F (36.7 C) (Oral)   Wt 183 lb (83 kg)   SpO2 95%   BMI 27.02 kg/m    Subjective:    Patient ID: William Ball, male    DOB: 1945-06-29, 74 y.o.   MRN: [id number]  HPI: William Ball is a 74 y.o. male  Chief Complaint  Patient presents with  . Diabetes  . Hypertension   Patient presenting today for f/u chronic conditions.   DM - Taking metformin and glipizide just once a day as he didn't understand the dosing. No low blood sugar spells known or side effects. Not checking home bs's.   HTN - home BPs running 130s/70s - 160s/70s on average. Denies CP, SOB, HAs, dizziness. Has not been exercising or following DASH diet.   Relevant past medical, surgical, family and social history reviewed and updated as indicated. Interim medical history since our last visit reviewed. Allergies and medications reviewed and updated.  Review of Systems  Per HPI unless specifically indicated above     Objective:    BP (!) 168/68   Pulse 77   Temp 98.1 F (36.7 C) (Oral)   Wt 183 lb (83 kg)   SpO2 95%   BMI 27.02 kg/m   Wt Readings from Last 3 Encounters:  09/26/19 183 lb (83 kg)  09/02/19 182 lb 2 oz (82.6 kg)  07/01/19 187 lb (84.8 kg)    Physical Exam Vitals and nursing note reviewed.  Constitutional:      Appearance: Normal appearance.  HENT:     Head: Atraumatic.  Eyes:     Extraocular Movements: Extraocular movements intact.     Conjunctiva/sclera: Conjunctivae normal.  Cardiovascular:     Rate and Rhythm: Normal rate and regular rhythm.  Pulmonary:     Effort: Pulmonary effort is normal.     Breath sounds: Normal breath sounds.  Musculoskeletal:        General: Normal range of motion.     Cervical back: Normal range of motion and neck supple.  Skin:    General: Skin is warm and dry.  Neurological:     General: No focal deficit present.     Mental Status: He is oriented to person, place, and time.  Psychiatric:        Mood  and Affect: Mood normal.        Thought Content: Thought content normal.        Judgment: Judgment normal.     Results for orders placed or performed in visit on 09/26/19  Comprehensive metabolic panel  Result Value Ref Range   Glucose 127 (H) 65 - 99 mg/dL   BUN 25 8 - 27 mg/dL   Creatinine, Ser 1.21 0.76 - 1.27 mg/dL   GFR calc non Af Amer 59 (L) >59 mL/min/1.73   GFR calc Af Amer 68 >59 mL/min/1.73   BUN/Creatinine Ratio 21 10 - 24   Sodium 139 134 - 144 mmol/L   Potassium 4.3 3.5 - 5.2 mmol/L   Chloride 104 96 - 106 mmol/L   CO2 21 20 - 29 mmol/L   Calcium 9.5 8.6 - 10.2 mg/dL   Total Protein 7.5 6.0 - 8.5 g/dL   Albumin 4.6 3.7 - 4.7 g/dL   Globulin, Total 2.9 1.5 - 4.5 g/dL   Albumin/Globulin Ratio 1.6 1.2 - 2.2   Bilirubin Total <0.2 0.0 - 1.2 mg/dL   Alkaline Phosphatase 121 48 - 121 IU/L  AST 24 0 - 40 IU/L   ALT 31 0 - 44 IU/L  Lipid Panel w/o Chol/HDL Ratio  Result Value Ref Range   Cholesterol, Total 100 100 - 199 mg/dL   Triglycerides 182 (H) 0 - 149 mg/dL   HDL 36 (L) >39 mg/dL   VLDL Cholesterol Cal 30 5 - 40 mg/dL   LDL Chol Calc (NIH) 34 0 - 99 mg/dL  HgB A1c  Result Value Ref Range   Hgb A1c MFr Bld 7.5 (H) 4.8 - 5.6 %   Est. average glucose Bld gHb Est-mCnc 169 mg/dL      Assessment & Plan:   Problem List Items Addressed This Visit      Cardiovascular and Mediastinum   Type 2 DM with CKD stage 3 and hypertension (HCC)    Recheck A1C, adjust as needed. Continue current regimen      Relevant Medications   hydrALAZINE (APRESOLINE) 50 MG tablet   glipiZIDE (GLUCOTROL) 5 MG tablet   metFORMIN (GLUCOPHAGE) 1000 MG tablet   Other Relevant Orders   HgB A1c (Completed)   Hypertension associated with diabetes ([place]) - Primary    Not consistently under good control. Increase hydralazine to TID dosing and continue to monitor closely. DASH diet, exercise reviewed      Relevant Medications   hydrALAZINE (APRESOLINE) 50 MG tablet   glipiZIDE  (GLUCOTROL) 5 MG tablet   metFORMIN (GLUCOPHAGE) 1000 MG tablet   Other Relevant Orders   Comprehensive metabolic panel (Completed)     Other   Hyperlipidemia    Recheck lipids, adjust as needed. Lifestyle modifications reviewed      Relevant Medications   hydrALAZINE (APRESOLINE) 50 MG tablet   Other Relevant Orders   Lipid Panel w/o Chol/HDL Ratio (Completed)       Follow up plan: Return in about 6 months (around 03/28/2020) for CPE.

## 2019-09-27 ENCOUNTER — Encounter (INDEPENDENT_AMBULATORY_CARE_PROVIDER_SITE_OTHER): Payer: Medicare HMO

## 2019-09-27 ENCOUNTER — Ambulatory Visit (INDEPENDENT_AMBULATORY_CARE_PROVIDER_SITE_OTHER): Payer: Medicare HMO | Admitting: Vascular Surgery

## 2019-09-27 LAB — COMPREHENSIVE METABOLIC PANEL WITH GFR
ALT: 31 IU/L (ref 0–44)
AST: 24 IU/L (ref 0–40)
Albumin/Globulin Ratio: 1.6 (ref 1.2–2.2)
Albumin: 4.6 g/dL (ref 3.7–4.7)
Alkaline Phosphatase: 121 IU/L (ref 48–121)
BUN/Creatinine Ratio: 21 (ref 10–24)
BUN: 25 mg/dL (ref 8–27)
Bilirubin Total: <0.2 mg/dL (ref 0.0–1.2)
CO2: 21 mmol/L (ref 20–29)
Calcium: 9.5 mg/dL (ref 8.6–10.2)
Chloride: 104 mmol/L (ref 96–106)
Creatinine, Ser: 1.21 mg/dL (ref 0.76–1.27)
GFR calc Af Amer: 68 mL/min/1.73
GFR calc non Af Amer: 59 mL/min/1.73 — ABNORMAL LOW
Globulin, Total: 2.9 g/dL (ref 1.5–4.5)
Glucose: 127 mg/dL — ABNORMAL HIGH (ref 65–99)
Potassium: 4.3 mmol/L (ref 3.5–5.2)
Sodium: 139 mmol/L (ref 134–144)
Total Protein: 7.5 g/dL (ref 6.0–8.5)

## 2019-09-27 LAB — HEMOGLOBIN A1C
Est. average glucose Bld gHb Est-mCnc: 169 mg/dL
Hgb A1c MFr Bld: 7.5 % — ABNORMAL HIGH (ref 4.8–5.6)

## 2019-09-27 LAB — LIPID PANEL W/O CHOL/HDL RATIO
Cholesterol, Total: 100 mg/dL (ref 100–199)
HDL: 36 mg/dL — ABNORMAL LOW
LDL Chol Calc (NIH): 34 mg/dL (ref 0–99)
Triglycerides: 182 mg/dL — ABNORMAL HIGH (ref 0–149)
VLDL Cholesterol Cal: 30 mg/dL (ref 5–40)

## 2019-10-03 NOTE — Assessment & Plan Note (Signed)
Recheck A1C, adjust as needed. Continue current regimen. 

## 2019-10-03 NOTE — Assessment & Plan Note (Signed)
Recheck lipids, adjust as needed. Lifestyle modifications reviewed

## 2019-10-03 NOTE — Assessment & Plan Note (Signed)
Not consistently under good control. Increase hydralazine to TID dosing and continue to monitor closely. DASH diet, exercise reviewed

## 2019-10-07 ENCOUNTER — Ambulatory Visit (INDEPENDENT_AMBULATORY_CARE_PROVIDER_SITE_OTHER): Payer: Medicare HMO | Admitting: Nurse Practitioner

## 2019-10-07 ENCOUNTER — Encounter (INDEPENDENT_AMBULATORY_CARE_PROVIDER_SITE_OTHER): Payer: Medicare HMO

## 2019-10-11 ENCOUNTER — Telehealth: Payer: Self-pay | Admitting: Family Medicine

## 2019-10-11 NOTE — Telephone Encounter (Signed)
Patient will be available to speak about labs after 3pm tomorrow

## 2019-10-11 NOTE — Telephone Encounter (Signed)
Called pt regarding labs, unable to leave VM. WIll try again later

## 2019-10-12 ENCOUNTER — Telehealth: Payer: Self-pay

## 2019-10-12 DIAGNOSIS — Z122 Encounter for screening for malignant neoplasm of respiratory organs: Secondary | ICD-10-CM

## 2019-10-12 DIAGNOSIS — Z87891 Personal history of nicotine dependence: Secondary | ICD-10-CM

## 2019-10-12 NOTE — Telephone Encounter (Signed)
Patient has been notified that the low dose lung cancer screening CT scan is due currently or will be in near future.  Confirmed that patient is within the appropriate age range and asymptomatic, (no signs or symptoms of lung cancer).  Patient denies illness that would prevent curative treatment for lung cancer if found.  Patient is agreeable for CT scan being scheduled.    Verified smoking history (current smoker, with 54 year 1.25 ppd history).   Patient prefers an afternoon appointment around 3:00 or 3:30 due to work schedule.

## 2019-10-13 MED ORDER — GLIPIZIDE 10 MG PO TABS: 10.0000 mg | ORAL_TABLET | Freq: Two times a day (BID) | ORAL | 1 refills | Status: DC

## 2019-10-13 NOTE — Addendum Note (Signed)
Addended by: Lieutenant Diego on: 10/13/2019 02:01 PM   Modules accepted: Orders

## 2019-10-13 NOTE — Telephone Encounter (Signed)
Returned call, unablel to leave VM. WIll try again at end of day

## 2019-10-13 NOTE — Telephone Encounter (Signed)
Smoking history: current, 87.75 pack year

## 2019-10-13 NOTE — Telephone Encounter (Signed)
Called pt, discussed elevated A1C - agreeable to increasing glipizide and working on diet and exercise. Will recheck at scheduled f/u. Discussed watching for hypoglycemic episodes

## 2019-10-27 ENCOUNTER — Other Ambulatory Visit: Payer: Self-pay

## 2019-10-27 ENCOUNTER — Ambulatory Visit
Admission: RE | Admit: 2019-10-27 | Discharge: 2019-10-27 | Disposition: A | Discharge status: Home / Self Care | Payer: Medicare HMO | Source: Ambulatory Visit | Attending: Oncology | Admitting: Oncology

## 2019-10-27 DIAGNOSIS — Z122 Encounter for screening for malignant neoplasm of respiratory organs: Secondary | ICD-10-CM | POA: Diagnosis not present

## 2019-10-27 DIAGNOSIS — Z87891 Personal history of nicotine dependence: Secondary | ICD-10-CM | POA: Diagnosis present

## 2019-10-27 DIAGNOSIS — R69 Illness, unspecified: Secondary | ICD-10-CM | POA: Diagnosis not present

## 2019-11-01 ENCOUNTER — Encounter (INDEPENDENT_AMBULATORY_CARE_PROVIDER_SITE_OTHER): Payer: Self-pay | Admitting: Nurse Practitioner

## 2019-11-01 ENCOUNTER — Ambulatory Visit (INDEPENDENT_AMBULATORY_CARE_PROVIDER_SITE_OTHER): Payer: Medicare HMO

## 2019-11-01 ENCOUNTER — Other Ambulatory Visit: Payer: Self-pay

## 2019-11-01 ENCOUNTER — Ambulatory Visit (INDEPENDENT_AMBULATORY_CARE_PROVIDER_SITE_OTHER): Payer: Medicare HMO | Admitting: Nurse Practitioner

## 2019-11-01 VITALS — BP 151/57 | HR 70 | Resp 16 | Wt 186.0 lb

## 2019-11-01 DIAGNOSIS — E1159 Type 2 diabetes mellitus with other circulatory complications: Secondary | ICD-10-CM | POA: Diagnosis not present

## 2019-11-01 DIAGNOSIS — Z87891 Personal history of nicotine dependence: Secondary | ICD-10-CM

## 2019-11-01 DIAGNOSIS — I739 Peripheral vascular disease, unspecified: Secondary | ICD-10-CM | POA: Diagnosis not present

## 2019-11-01 DIAGNOSIS — I152 Hypertension secondary to endocrine disorders: Secondary | ICD-10-CM

## 2019-11-01 DIAGNOSIS — I10 Essential (primary) hypertension: Secondary | ICD-10-CM

## 2019-11-01 DIAGNOSIS — E785 Hyperlipidemia, unspecified: Secondary | ICD-10-CM | POA: Diagnosis not present

## 2019-11-01 NOTE — Progress Notes (Signed)
Subjective:    Patient ID: William Ball, male    DOB: [birth year], 74 y.o.   MRN: [id number] Chief Complaint  Patient presents with  . Follow-up    ultrasound follow up    The patient returns to the office for followup and review of the noninvasive studies. There have been no interval changes in lower extremity symptoms. No interval shortening of the patient's claudication distance or development of rest pain symptoms. No new ulcers or wounds have occurred since the last visit.  The patient does endorse having pain in his ankles and hips however the patient notes that this pain has been ongoing for years and there has not been any change in the recent year or so.  The patient works an occupation where he stands for long periods of time, with frequent twisting motions.  There have been no significant changes to the patient's overall health care.  The patient denies amaurosis fugax or recent TIA symptoms. There are no recent neurological changes noted. The patient denies history of DVT, PE or superficial thrombophlebitis. The patient denies recent episodes of angina or shortness of breath.   ABI Rt=0.82 and Lt=0.85  (previous ABI's Rt=1.04 and Lt=1.12) Duplex ultrasound of the tibial arteries reveals biphasic/triphasic waveforms in the left with triphasic waveforms in the right tibial arteries.  The bilateral great toe waveforms are good.  A limited duplex notes that there are velocities of greater than 200 cm/s in the left distal external iliac artery and common femoral artery with significant left common femoral artery plaque formation.  Compared to the previous ABIs on 09/24/2018 this is reduced.   Review of Systems  Musculoskeletal: Positive for arthralgias and back pain.  All other systems reviewed and are negative.      Objective:   Physical Exam Vitals reviewed.  HENT:     Head: Normocephalic.  Cardiovascular:     Rate and Rhythm: Normal rate and regular rhythm.     Pulses:  Normal pulses.     Heart sounds: Normal heart sounds.     Comments: Spider varicosities at the bilateral ankles Pulmonary:     Effort: Pulmonary effort is normal.     Breath sounds: Normal breath sounds.  Musculoskeletal:        General: Tenderness present.     Right lower leg: Edema present.     Left lower leg: Edema present.  Skin:    General: Skin is warm and dry.     Capillary Refill: Capillary refill takes less than 2 seconds.  Neurological:     Mental Status: He is alert and oriented to person, place, and time.     Gait: Gait abnormal.  Psychiatric:        Mood and Affect: Mood normal.        Behavior: Behavior normal.        Thought Content: Thought content normal.        Judgment: Judgment normal.     BP (!) 151/57 (BP Location: Right Arm)   Pulse 70   Resp 16   Wt 186 lb (84.4 kg)   BMI 27.47 kg/m   Past Medical History:  Diagnosis Date  . Diabetes mellitus without complication (HCC)    borderline  . Hepatomegaly   . Hyperlipidemia   . Hypertension   . Substance abuse ([place])     Social History   Socioeconomic History  . Marital status: Married    Spouse name: Not on file  . Number of  children: Not on file  . Years of education: Not on file  . Highest education level: 9th grade  Occupational History  . Occupation: working full time   Tobacco Use  . Smoking status: Current Every Day Smoker    Packs/day: 1.00    Years: 55.00    Pack years: 55.00    Types: Cigarettes  . Smokeless tobacco: Never Used  Vaping Use  . Vaping Use: Never used  Substance and Sexual Activity  . Alcohol use: Yes    Alcohol/week: 28.0 standard drinks    Types: 28 Cans of beer per week    Comment: 4-6 daily beer  . Drug use: No  . Sexual activity: Not on file  Other Topics Concern  . Not on file  Social History Narrative   Retired   Investment banker, operational of Radio broadcast assistant Strain:   . Difficulty of Paying Living Expenses:   Food Insecurity:   . Worried  About Charity fundraiser in the Last Year:   . Arboriculturist in the Last Year:   Transportation Needs:   . Film/video editor (Medical):   [name] Lack of Transportation (Non-Medical):   Physical Activity:   . Days of Exercise per Week:   . Minutes of Exercise per Session:   Stress:   . Feeling of Stress :   Social Connections:   . Frequency of Communication with Friends and Family:   . Frequency of Social Gatherings with Friends and Family:   . Attends Religious Services:   . Active Member of Clubs or Organizations:   . Attends Archivist Meetings:   [name] Marital Status:   Intimate Partner Violence:   . Fear of Current or Ex-Partner:   . Emotionally Abused:   [name] Physically Abused:   . Sexually Abused:     Past Surgical History:  Procedure Laterality Date  . ANGIOPLASTY    . APPENDECTOMY    . BONE MARROW TRANSPLANT Left    ankle.  patient unsure of this but thinks this is what happened  . CARDIAC SURGERY    . CATARACT EXTRACTION W/PHACO Left 11/13/2015   Procedure: CATARACT EXTRACTION PHACO AND INTRAOCULAR LENS PLACEMENT (IOC);  Surgeon: [name], MD;  Location: [facility];  Service: Ophthalmology;  Laterality: Left;  [place] 1.06 AP% 23.9 CDE 15.81 Fluid pack lot # [id number]  . CATARACT EXTRACTION W/PHACO Right 11/27/2015   Procedure: CATARACT EXTRACTION PHACO AND INTRAOCULAR LENS PLACEMENT (IOC);  Surgeon: [name], MD;  Location: [facility];  Service: Ophthalmology;  Laterality: Right;  [place] 1.06 AP% 23.8 CDE 15.80 Fluid pack lot # [id number]  . COLONOSCOPY  2012   polyps, diverticulosis , 53yr repeat  . COLONOSCOPY WITH PROPOFOL N/A 07/17/2016   Procedure: COLONOSCOPY WITH PROPOFOL;  Surgeon: [name], MD;  Location: [facility];  Service: Endoscopy;  Laterality: N/A;  . FRACTURE SURGERY Left 1982   foot, leg.unsure if metal in these parts.possibly replaced ankle  . PERIPHERAL VASCULAR CATHETERIZATION Left 12/27/2015   Procedure: Lower Extremity  Angiography;  Surgeon: Algernon Huxley, MD;  Location: [facility];  Service: Cardiovascular;  Laterality: Left;    Family History  Problem Relation Age of Onset  . Cancer Mother   . Heart disease Mother   . Hyperlipidemia Mother   . Heart disease Father   . Hyperlipidemia Father     No Known Allergies     Assessment & Plan:   1. PAD (peripheral artery disease) (  [place])  Recommend:  The patient has evidence of atherosclerosis of the lower extremities with claudication.  The patient does not voice lifestyle limiting changes at this point in time.  Noninvasive studies do not suggest clinically significant change.  No invasive studies, angiography or surgery at this time The patient should continue walking and begin a more formal exercise program.  The patient should continue antiplatelet therapy and aggressive treatment of the lipid abnormalities  No changes in the patient's medications at this time  The patient should continue wearing graduated compression socks 10-15 mmHg strength to control the mild edema.    2. Hypertension associated with diabetes ([place]) Continue antihypertensive medications as already ordered, these medications have been reviewed and there are no changes at this time.   3. Personal history of tobacco use, presenting hazards to health Smoking cessation was discussed, 3-10 minutes spent on this topic specifically   4. Hyperlipidemia, unspecified hyperlipidemia type Continue statin as ordered and reviewed, no changes at this time    Current Outpatient Medications on File Prior to Visit  Medication Sig Dispense Refill  . amLODipine (NORVASC) 10 MG tablet Take 10 mg by mouth daily.    [name] aspirin EC 81 MG tablet Take 81 mg by mouth daily.    [name] atorvastatin (LIPITOR) 20 MG tablet Take 1 tablet (20 mg total) by mouth daily. 90 tablet 4  . chlorthalidone (HYGROTON) 50 MG tablet TAKE 1 TABLET BY MOUTH EVERY DAY 90 tablet 1  . clopidogrel (PLAVIX) 75 MG  tablet Take 1 tablet (75 mg total) by mouth daily. 90 tablet 4  . glipiZIDE (GLUCOTROL) 10 MG tablet Take 1 tablet (10 mg total) by mouth 2 (two) times daily before a meal. 90 tablet 1  . hydrALAZINE (APRESOLINE) 50 MG tablet Take 1 tablet (50 mg total) by mouth 3 (three) times daily. 90 tablet 2  . lisinopril (ZESTRIL) 40 MG tablet Take 40 mg by mouth daily.    . metFORMIN (GLUCOPHAGE) 1000 MG tablet Take 1 tablet (1,000 mg total) by mouth 2 (two) times daily with a meal. 180 tablet 1  . Omega-3 Fatty Acids (FISH OIL PO) Take by mouth daily.    . vitamin B-12 (CYANOCOBALAMIN) 500 MCG tablet Take 500 mcg by mouth daily.     No current facility-administered medications on file prior to visit.    There are no Patient Instructions on file for this visit. No follow-ups on file.   Kris Hartmann, NP

## 2019-11-02 ENCOUNTER — Encounter: Payer: Self-pay | Admitting: *Deleted

## 2019-11-28 ENCOUNTER — Ambulatory Visit: Payer: Medicare HMO

## 2019-12-05 ENCOUNTER — Ambulatory Visit: Payer: Medicare HMO | Admitting: Cardiology

## 2019-12-05 ENCOUNTER — Encounter: Payer: Self-pay | Admitting: Cardiology

## 2019-12-05 ENCOUNTER — Other Ambulatory Visit: Payer: Self-pay

## 2019-12-05 VITALS — BP 144/60 | HR 62 | Ht 69.0 in | Wt 181.0 lb

## 2019-12-05 DIAGNOSIS — I2510 Atherosclerotic heart disease of native coronary artery without angina pectoris: Secondary | ICD-10-CM | POA: Diagnosis not present

## 2019-12-05 DIAGNOSIS — I10 Essential (primary) hypertension: Secondary | ICD-10-CM

## 2019-12-05 DIAGNOSIS — F17200 Nicotine dependence, unspecified, uncomplicated: Secondary | ICD-10-CM

## 2019-12-05 DIAGNOSIS — IMO0001 Reserved for inherently not codable concepts without codable children: Secondary | ICD-10-CM

## 2019-12-05 DIAGNOSIS — R69 Illness, unspecified: Secondary | ICD-10-CM | POA: Diagnosis not present

## 2019-12-05 NOTE — Patient Instructions (Signed)
Medication Instructions:   Your physician has recommended you make the following change in your medication:   STOP taking your Plavix.  *If you need a refill on your cardiac medications before your next appointment, please call your pharmacy*   Lab Work: None Ordered  If you have labs (blood work) drawn today and your tests are completely normal, you will receive your results only by: [name] MyChart Message (if you have MyChart) OR . A paper copy in the mail If you have any lab test that is abnormal or we need to change your treatment, we will call you to review the results.   Testing/Procedures: None Ordered   Follow-Up: At [facility], you and your health needs are our priority.  As part of our continuing mission to provide you with exceptional heart care, we have created designated Provider Care Teams.  These Care Teams include your primary Cardiologist (physician) and Advanced Practice Providers (APPs -  Physician Assistants and Nurse Practitioners) who all work together to provide you with the care you need, when you need it.  We recommend signing up for the patient portal called "MyChart".  Sign up information is provided on this After Visit Summary.  MyChart is used to connect with patients for Virtual Visits (Telemedicine).  Patients are able to view lab/test results, encounter notes, upcoming appointments, etc.  Non-urgent messages can be sent to your provider as well.   To learn more about what you can do with MyChart, go to [email or web address].    Your next appointment:   6 month(s)  The format for your next appointment:   In Person  Provider:   Kate Sable, MD   Other Instructions N/A

## 2019-12-05 NOTE — Progress Notes (Signed)
Cardiology Office Note:    Date:  12/05/2019   ID:  William Ball, DOB 1945/08/23, MRN [id number]  PCP:  Patient, No Pcp Per  Cardiologist:  No primary care provider on file.  Electrophysiologist:  None   Referring MD: Volney American,*   Chief Complaint  Patient presents with  . Other    3 month follow up. Patient c.o HR dropping to 49. Meds reviewed verbally with patient.     History of Present Illness:    William Ball is a 74 y.o. male with a hx of CAD , diabetes, hyperlipidemia, hypertension, current smoker x50+ years who presents for follow-up.  Patient last seen for elevated blood pressures, hydralazine dose was increased.  He states feeling well, denies any symptoms of chest pain.  He still smokes.  He has also noticed easily bruising in his hands.  He checks his blood pressure frequently at home and is usually 810-175 systolic.  Prior notes. Coronary CTA showed calcium score of 702.  Calcified plaques in the mid LAD causing moderate stenosis, calcified plaque in the proximal and mid RCA, proximal LAD, mid left circumflex causing mild stenosis.  CT FFR with no significant obstruction.  Echocardiogram 08/2019 showed normal systolic and diastolic function, mild LVH, EF 60 to 65%   Past Medical History:  Diagnosis Date  . Diabetes mellitus without complication (HCC)    borderline  . Hepatomegaly   . Hyperlipidemia   . Hypertension   . Substance abuse [facility])     Past Surgical History:  Procedure Laterality Date  . ANGIOPLASTY    . APPENDECTOMY    . BONE MARROW TRANSPLANT Left    ankle.  patient unsure of this but thinks this is what happened  . CARDIAC SURGERY    . CATARACT EXTRACTION W/PHACO Left 11/13/2015   Procedure: CATARACT EXTRACTION PHACO AND INTRAOCULAR LENS PLACEMENT (IOC);  Surgeon: [name], MD;  Location: [facility] OR[facility];  Service: Ophthalmology;  Laterality: Left;  [place] 1.06 AP% 23.9 CDE 15.81 Fluid pack lot # [id number]  . CATARACT EXTRACTION W/PHACO  Right 11/27/2015   Procedure: CATARACT EXTRACTION PHACO AND INTRAOCULAR LENS PLACEMENT (IOC);  Surgeon: [name], MD;  Location: [facility];  Service: Ophthalmology;  Laterality: Right;  [place] 1.06 AP% 23.8 CDE 15.80 Fluid pack lot # [id number]  . COLONOSCOPY  2012   polyps, diverticulosis , 26yr repeat  . COLONOSCOPY WITH PROPOFOL N/A 07/17/2016   Procedure: COLONOSCOPY WITH PROPOFOL;  Surgeon: [name], MD;  Location: [facility];  Service: Endoscopy;  Laterality: N/A;  . FRACTURE SURGERY Left 1982   foot, leg.unsure if metal in these parts.possibly replaced ankle  . PERIPHERAL VASCULAR CATHETERIZATION Left 12/27/2015   Procedure: Lower Extremity Angiography;  Surgeon: Algernon Huxley, MD;  Location: [place] [facility];  Service: Cardiovascular;  Laterality: Left;    Current Medications: Current Meds  Medication Sig  . amLODipine (NORVASC) 10 MG tablet Take 10 mg by mouth daily.  [name] aspirin EC 81 MG tablet Take 81 mg by mouth daily.  [name] atorvastatin (LIPITOR) 20 MG tablet Take 1 tablet (20 mg total) by mouth daily.  . chlorthalidone (HYGROTON) 50 MG tablet TAKE 1 TABLET BY MOUTH EVERY DAY  . glipiZIDE (GLUCOTROL) 10 MG tablet Take 1 tablet (10 mg total) by mouth 2 (two) times daily before a meal.  . hydrALAZINE (APRESOLINE) 50 MG tablet Take 1 tablet (50 mg total) by mouth 3 (three) times daily.  [name] lisinopril (ZESTRIL) 40 MG tablet Take 40  mg by mouth daily.  . metFORMIN (GLUCOPHAGE) 1000 MG tablet Take 1 tablet (1,000 mg total) by mouth 2 (two) times daily with a meal.  . Omega-3 Fatty Acids (FISH OIL PO) Take by mouth daily.  . vitamin B-12 (CYANOCOBALAMIN) 500 MCG tablet Take 500 mcg by mouth daily.  . [DISCONTINUED] clopidogrel (PLAVIX) 75 MG tablet Take 1 tablet (75 mg total) by mouth daily.     Allergies:   Patient has no known allergies.   Social History   Socioeconomic History  . Marital status: Married    Spouse name: Not on file  . Number of children: Not on file  .  Years of education: Not on file  . Highest education level: 9th grade  Occupational History  . Occupation: working full time   Tobacco Use  . Smoking status: Current Every Day Smoker    Packs/day: 1.00    Years: 55.00    Pack years: 55.00    Types: Cigarettes  . Smokeless tobacco: Never Used  Vaping Use  . Vaping Use: Never used  Substance and Sexual Activity  . Alcohol use: Yes    Alcohol/week: 28.0 standard drinks    Types: 28 Cans of beer per week    Comment: 4-6 daily beer  . Drug use: No  . Sexual activity: Not on file  Other Topics Concern  . Not on file  Social History Narrative   Retired   Investment banker, operational of Radio broadcast assistant Strain:   . Difficulty of Paying Living Expenses:   Food Insecurity:   . Worried About Charity fundraiser in the Last Year:   . Arboriculturist in the Last Year:   Transportation Needs:   . Film/video editor (Medical):   [name] Lack of Transportation (Non-Medical):   Physical Activity:   . Days of Exercise per Week:   . Minutes of Exercise per Session:   Stress:   . Feeling of Stress :   Social Connections:   . Frequency of Communication with Friends and Family:   . Frequency of Social Gatherings with Friends and Family:   . Attends Religious Services:   . Active Member of Clubs or Organizations:   . Attends Archivist Meetings:   [name] Marital Status:      Family History: The patient's family history includes Cancer in his mother; Heart disease in his father and mother; Hyperlipidemia in his father and mother.  ROS:   Please see the history of present illness.     All other systems reviewed and are negative.  EKGs/Labs/Other Studies Reviewed:    The following studies were reviewed today:   EKG:  EKG is  ordered today.  The ekg ordered today demonstrates normal sinus rhythm, first-degree AV block  Recent Labs: 06/13/2019: Hemoglobin 14.0; Platelets 258 09/26/2019: ALT 31; BUN 25; Creatinine, Ser 1.21;  Potassium 4.3; Sodium 139  Recent Lipid Panel    Component Value Date/Time   CHOL 100 09/26/2019 1603   CHOL 117 02/18/2018 1528   TRIG 182 (H) 09/26/2019 1603   TRIG 477 (H) 02/18/2018 1528   HDL 36 (L) 09/26/2019 1603   CHOLHDL 3.5 06/12/2016 1502   VLDL 39 (H) 02/09/2017 1527   LDLCALC 34 09/26/2019 1603    Physical Exam:    VS:  BP (!) 144/60 (BP Location: Left Arm, Patient Position: Sitting, Cuff Size: Normal)   Pulse 62   Ht 5\' 9"  (1.753 m)   Wt 181 lb (  82.1 kg)   BMI 26.73 kg/m     Wt Readings from Last 3 Encounters:  12/05/19 181 lb (82.1 kg)  11/01/19 186 lb (84.4 kg)  10/27/19 183 lb (83 kg)     GEN:  Well nourished, well developed in no acute distress HEENT: Normal NECK: No JVD; No carotid bruits LYMPHATICS: No lymphadenopathy CARDIAC: rrr, no murmurs, rubs, gallops RESPIRATORY:  Clear to auscultation without rales, wheezing or rhonchi  ABDOMEN: Soft, non-tender, non-distended MUSCULOSKELETAL:  No edema; No deformity  SKIN: Warm and dry NEUROLOGIC:  Alert and oriented x 3  PSYCHIATRIC:  Normal affect   ASSESSMENT:    1. Coronary artery disease involving native coronary artery of native heart without angina pectoris   2. Essential hypertension   3. Smoking    PLAN:    In order of problems listed above:  1. Patient with history of nonobstructive CAD, calcium score of 702.  Echocardiogram shows normal systolic and diastolic function, EF 44%.  Continue aspirin and Lipitor as prescribed.  Okay to stop Plavix due to easy bruising.  LDL at goal of <70 2. History of hypertension his blood pressure is improved.  Continue hydralazine, amlodipine, chlorthalidone, lisinopril as prescribed. 3. Current smoker, smoking cessation advised,   Follow-up in 6 months  Total encounter time35 minutes  Greater than 50% was spent in counseling and coordination of care with the patient   Medication Adjustments/Labs and Tests Ordered: Current medicines are reviewed at  length with the patient today.  Concerns regarding medicines are outlined above.  Orders Placed This Encounter  Procedures  . EKG 12-Lead   No orders of the defined types were placed in this encounter.   Patient Instructions  Medication Instructions:   Your physician has recommended you make the following change in your medication:   STOP taking your Plavix.  *If you need a refill on your cardiac medications before your next appointment, please call your pharmacy*   Lab Work: None Ordered  If you have labs (blood work) drawn today and your tests are completely normal, you will receive your results only by: [name] MyChart Message (if you have MyChart) OR . A paper copy in the mail If you have any lab test that is abnormal or we need to change your treatment, we will call you to review the results.   Testing/Procedures: None Ordered   Follow-Up: At [facility], you and your health needs are our priority.  As part of our continuing mission to provide you with exceptional heart care, we have created designated Provider Care Teams.  These Care Teams include your primary Cardiologist (physician) and Advanced Practice Providers (APPs -  Physician Assistants and Nurse Practitioners) who all work together to provide you with the care you need, when you need it.  We recommend signing up for the patient portal called "MyChart".  Sign up information is provided on this After Visit Summary.  MyChart is used to connect with patients for Virtual Visits (Telemedicine).  Patients are able to view lab/test results, encounter notes, upcoming appointments, etc.  Non-urgent messages can be sent to your provider as well.   To learn more about what you can do with MyChart, go to [email or web address].    Your next appointment:   6 month(s)  The format for your next appointment:   In Person  Provider:   Kate Sable, MD   Other Instructions N/A     Signed, Kate Sable, MD   12/05/2019 4:26 PM    [facility]

## 2019-12-20 ENCOUNTER — Other Ambulatory Visit: Payer: Self-pay

## 2019-12-26 ENCOUNTER — Other Ambulatory Visit: Payer: Self-pay

## 2019-12-26 MED ORDER — CHLORTHALIDONE 50 MG PO TABS: 50.0000 mg | ORAL_TABLET | Freq: Every day | ORAL | 0 refills | Status: DC

## 2019-12-26 NOTE — Telephone Encounter (Signed)
[facility] faxed

## 2020-01-04 ENCOUNTER — Other Ambulatory Visit: Payer: Self-pay

## 2020-01-04 DIAGNOSIS — E785 Hyperlipidemia, unspecified: Secondary | ICD-10-CM

## 2020-01-04 NOTE — Telephone Encounter (Signed)
Previous RL patient. Last seen 09/26/19, advised to f/up in 6 months

## 2020-01-06 MED ORDER — ATORVASTATIN CALCIUM 20 MG PO TABS: 20.0000 mg | ORAL_TABLET | Freq: Every day | ORAL | 0 refills | Status: DC

## 2020-01-12 ENCOUNTER — Other Ambulatory Visit: Payer: Self-pay | Admitting: Family Medicine

## 2020-02-02 ENCOUNTER — Other Ambulatory Visit: Payer: Self-pay

## 2020-02-02 DIAGNOSIS — E785 Hyperlipidemia, unspecified: Secondary | ICD-10-CM | POA: Diagnosis not present

## 2020-02-02 DIAGNOSIS — I700 Atherosclerosis of aorta: Secondary | ICD-10-CM | POA: Diagnosis not present

## 2020-02-02 DIAGNOSIS — E1151 Type 2 diabetes mellitus with diabetic peripheral angiopathy without gangrene: Secondary | ICD-10-CM | POA: Diagnosis not present

## 2020-02-02 DIAGNOSIS — I739 Peripheral vascular disease, unspecified: Secondary | ICD-10-CM | POA: Diagnosis not present

## 2020-02-02 DIAGNOSIS — D692 Other nonthrombocytopenic purpura: Secondary | ICD-10-CM | POA: Diagnosis not present

## 2020-02-02 DIAGNOSIS — Z125 Encounter for screening for malignant neoplasm of prostate: Secondary | ICD-10-CM | POA: Diagnosis not present

## 2020-02-02 DIAGNOSIS — Z23 Encounter for immunization: Secondary | ICD-10-CM | POA: Diagnosis not present

## 2020-02-02 DIAGNOSIS — E1122 Type 2 diabetes mellitus with diabetic chronic kidney disease: Secondary | ICD-10-CM | POA: Diagnosis not present

## 2020-02-02 DIAGNOSIS — E1159 Type 2 diabetes mellitus with other circulatory complications: Secondary | ICD-10-CM | POA: Diagnosis not present

## 2020-02-02 DIAGNOSIS — N183 Chronic kidney disease, stage 3 unspecified: Secondary | ICD-10-CM | POA: Diagnosis not present

## 2020-02-02 DIAGNOSIS — Z79899 Other long term (current) drug therapy: Secondary | ICD-10-CM | POA: Diagnosis not present

## 2020-02-02 DIAGNOSIS — Z87891 Personal history of nicotine dependence: Secondary | ICD-10-CM | POA: Diagnosis not present

## 2020-02-02 DIAGNOSIS — I152 Hypertension secondary to endocrine disorders: Secondary | ICD-10-CM | POA: Diagnosis not present

## 2020-02-02 DIAGNOSIS — I129 Hypertensive chronic kidney disease with stage 1 through stage 4 chronic kidney disease, or unspecified chronic kidney disease: Secondary | ICD-10-CM | POA: Diagnosis not present

## 2020-02-02 DIAGNOSIS — K219 Gastro-esophageal reflux disease without esophagitis: Secondary | ICD-10-CM | POA: Diagnosis not present

## 2020-02-02 DIAGNOSIS — J439 Emphysema, unspecified: Secondary | ICD-10-CM | POA: Diagnosis not present

## 2020-02-02 DIAGNOSIS — N400 Enlarged prostate without lower urinary tract symptoms: Secondary | ICD-10-CM | POA: Diagnosis not present

## 2020-02-02 NOTE — Telephone Encounter (Signed)
Previous RL patient. Last seen 09/26/19 and advised to f/up in 6 months.

## 2020-02-03 MED ORDER — AMLODIPINE BESYLATE 10 MG PO TABS: 10.0000 mg | ORAL_TABLET | Freq: Every day | ORAL | 0 refills | Status: DC

## 2020-03-05 DIAGNOSIS — I129 Hypertensive chronic kidney disease with stage 1 through stage 4 chronic kidney disease, or unspecified chronic kidney disease: Secondary | ICD-10-CM | POA: Diagnosis not present

## 2020-03-05 DIAGNOSIS — M25551 Pain in right hip: Secondary | ICD-10-CM | POA: Diagnosis not present

## 2020-03-05 DIAGNOSIS — D692 Other nonthrombocytopenic purpura: Secondary | ICD-10-CM | POA: Diagnosis not present

## 2020-03-05 DIAGNOSIS — M25571 Pain in right ankle and joints of right foot: Secondary | ICD-10-CM | POA: Diagnosis not present

## 2020-03-05 DIAGNOSIS — I359 Nonrheumatic aortic valve disorder, unspecified: Secondary | ICD-10-CM | POA: Diagnosis not present

## 2020-03-05 DIAGNOSIS — E785 Hyperlipidemia, unspecified: Secondary | ICD-10-CM | POA: Diagnosis not present

## 2020-03-05 DIAGNOSIS — I700 Atherosclerosis of aorta: Secondary | ICD-10-CM | POA: Diagnosis not present

## 2020-03-05 DIAGNOSIS — E1122 Type 2 diabetes mellitus with diabetic chronic kidney disease: Secondary | ICD-10-CM | POA: Diagnosis not present

## 2020-03-05 DIAGNOSIS — J439 Emphysema, unspecified: Secondary | ICD-10-CM | POA: Diagnosis not present

## 2020-03-05 DIAGNOSIS — I739 Peripheral vascular disease, unspecified: Secondary | ICD-10-CM | POA: Diagnosis not present

## 2020-04-03 ENCOUNTER — Other Ambulatory Visit (INDEPENDENT_AMBULATORY_CARE_PROVIDER_SITE_OTHER): Payer: Self-pay | Admitting: Nurse Practitioner

## 2020-04-03 DIAGNOSIS — I739 Peripheral vascular disease, unspecified: Secondary | ICD-10-CM

## 2020-04-04 ENCOUNTER — Encounter: Payer: Medicare HMO | Admitting: Family Medicine

## 2020-04-05 ENCOUNTER — Other Ambulatory Visit: Payer: Self-pay | Admitting: Family Medicine

## 2020-04-05 DIAGNOSIS — E785 Hyperlipidemia, unspecified: Secondary | ICD-10-CM

## 2020-04-05 NOTE — Telephone Encounter (Signed)
  Notes to clinic No PCP, Merrie Roof prescribed this med last.

## 2020-04-08 ENCOUNTER — Other Ambulatory Visit: Payer: Self-pay | Admitting: Nurse Practitioner

## 2020-04-08 NOTE — Telephone Encounter (Signed)
Requested medication (s) are due for refill today: yes  Requested medication (s) are on the active medication list: yes  Last refill:  01/12/20  Future visit scheduled: yes  Notes to clinic:  overdue lab work- scheduled appt 04/12/20 with Kathrine Haddock NP   Requested Prescriptions  Pending Prescriptions Disp Refills   glipiZIDE (GLUCOTROL) 10 MG tablet [Pharmacy Med Name: GLIPIZIDE 10 MG TABLET] 180 tablet 0    Sig: TAKE 1 TABLET (10 MG TOTAL) BY MOUTH 2 (TWO) TIMES DAILY BEFORE A MEAL.      Endocrinology:  Diabetes - Sulfonylureas Failed - 04/08/2020  9:35 AM      Failed - HBA1C is between 0 and 7.9 and within 180 days    Hemoglobin A1C  Date Value Ref Range Status  12/05/2015 6.7  Final   HB A1C (BAYER DCA - WAIVED)  Date Value Ref Range Status  11/24/2018 7.8 (H) <7.0 % Final    Comment:                                          Diabetic Adult            <7.0                                       Healthy Adult        4.3 - 5.7                                                           (DCCT/NGSP) American Diabetes Association's Summary of Glycemic Recommendations for Adults with Diabetes: Hemoglobin A1c <7.0%. More stringent glycemic goals (A1c <6.0%) may further reduce complications at the cost of increased risk of hypoglycemia.    Hgb A1c MFr Bld  Date Value Ref Range Status  09/26/2019 7.5 (H) 4.8 - 5.6 % Final    Comment:             Prediabetes: 5.7 - 6.4          Diabetes: >6.4          Glycemic control for adults with diabetes: <7.0           Failed - Valid encounter within last 6 months    Recent Outpatient Visits           6 months ago Hypertension associated with diabetes [facility])   [place], [place], [place]   9 months ago Hypertension associated with diabetes [facility])   [place], [place], [place]   10 months ago Type 2 DM with CKD stage 3 and hypertension [facility])   [place], [place], [place]   10 months ago Hypertension associated with diabetes [facility])   [facility] [place], [place]   1 year ago Type 2 DM with CKD stage 3 and hypertension [facility])   [place], [place], [place]       Future Appointments             In 4 days Kathrine Haddock, NP [facility], [place]

## 2020-04-09 ENCOUNTER — Ambulatory Visit (INDEPENDENT_AMBULATORY_CARE_PROVIDER_SITE_OTHER): Payer: Medicare HMO | Admitting: Nurse Practitioner

## 2020-04-09 ENCOUNTER — Encounter (INDEPENDENT_AMBULATORY_CARE_PROVIDER_SITE_OTHER): Payer: Medicare HMO

## 2020-04-09 NOTE — Telephone Encounter (Signed)
Scheduled with Malachy Mood

## 2020-04-12 ENCOUNTER — Ambulatory Visit: Payer: Self-pay | Admitting: Unknown Physician Specialty

## 2020-04-15 NOTE — Telephone Encounter (Signed)
Requested Prescriptions  Pending Prescriptions Disp Refills   atorvastatin (LIPITOR) 20 MG tablet [Pharmacy Med Name: ATORVASTATIN 20 MG TABLET] 90 tablet 1    Sig: TAKE 1 TABLET BY MOUTH EVERY DAY     Cardiovascular:  Antilipid - Statins Failed - 04/05/2020  1:13 AM      Failed - LDL in normal range and within 360 days    LDL Chol Calc (NIH)  Date Value Ref Range Status  09/26/2019 34 0 - 99 mg/dL Final         Failed - HDL in normal range and within 360 days    HDL  Date Value Ref Range Status  09/26/2019 36 (L) >39 mg/dL Final         Failed - Triglycerides in normal range and within 360 days    Triglycerides  Date Value Ref Range Status  09/26/2019 182 (H) 0 - 149 mg/dL Final   Triglycerides Piccolo,Waived  Date Value Ref Range Status  02/18/2018 477 (H) <150 mg/dL Final    Comment:                            Normal                   <150                         Borderline High     150 - 199                         High                200 - 499                         Very High                >499          Passed - Total Cholesterol in normal range and within 360 days    Cholesterol, Total  Date Value Ref Range Status  09/26/2019 100 100 - 199 mg/dL Final   Cholesterol Piccolo, Waived  Date Value Ref Range Status  02/18/2018 117 <200 mg/dL Final    Comment:                            Desirable                <200                         Borderline High      200- 239                         High                     >239          Passed - Patient is not pregnant      Passed - Valid encounter within last 12 months    Recent Outpatient Visits          6 months ago Hypertension associated with diabetes [facility])   [place], [place], [place]   9 months ago Hypertension associated with diabetes ([place])   [facility] Merrie Roof [place], [place]   10 months ago Type 2 DM with CKD stage 3 and hypertension [facility])   [place], [place], [place]   10 months ago Hypertension associated with diabetes [facility])   [facility] [place], [place]   1 year ago Type 2 DM with CKD stage 3 and hypertension [facility])   [place], [place], [place]

## 2020-04-17 DIAGNOSIS — M4726 Other spondylosis with radiculopathy, lumbar region: Secondary | ICD-10-CM | POA: Diagnosis not present

## 2020-05-02 ENCOUNTER — Other Ambulatory Visit: Payer: Self-pay | Admitting: Family Medicine

## 2020-05-02 NOTE — Telephone Encounter (Signed)
TC to patient. He has transferred to other pcp due to insurance reasons. Refusing this request as he is no longer a patient at the clinic.

## 2020-05-31 NOTE — Progress Notes (Deleted)
Cardiology Office Note    Date:  05/31/2020   ID:  Avyay, Coger [birth year], MRN [id number]  PCP:  Patient, No Pcp Per  Cardiologist:  Kate Sable, MD  Electrophysiologist:  None   Chief Complaint: Follow up  History of Present Illness:   William Ball is a 75 y.o. male with history of nonobstructive CAD medically managed as below, PAD s/p intervention of the right common and external iliac, left distal common and external iliac arteries followed by vascular surgery, DM, HTN, HLD, and ongoing tobacco use of 50+ years who presents for follow up of his CAD.   He was seen in 06/2019 with a 1-2 month history of chest pain that would last 1-2 seconds and resolve. He underwent coronary CTA in 08/2019 which showed a calcium score of 702, which was in the 73%. There was calcified plaque in the mid LAD causing moderate stenosis, calcified plaque in the proximal and mid RCA, proximal LAD, and mid LCx causing mild stenosis. FFR was without significant obstructive disease. Echo in 08/2019 showed an EF of 60-65%, mild LVH, and normal LV diastolic function. He was last seen in the office in 12/2019 and was doing well from a cardiac perspective. He was advised he could stop Plavix due to easy bruising.    More recently, he was evaluated by Dr. Ubaldo Glassing with [facility] on 03/05/2020 as an initial visit to establish care. He continued to smoke. He noted bilateral hip and ankle discomfort, for which he was referred to orthopedics. No changes were indicated.   ***   Labs independently reviewed: 01/2020 -magnesium 2.0, Hgb 15.3, PLT 268, potassium 4.7, BUN 22, serum protein 1.2, albumin 4.8, AST/ALT normal, A1c 6.6, TC 115, TG 121, HDL 45, LDL 45, TSH normal  Past Medical History:  Diagnosis Date  . Diabetes mellitus without complication (HCC)    borderline  . Hepatomegaly   . Hyperlipidemia   . Hypertension   . Substance abuse [facility])     Past Surgical History:  Procedure Laterality Date   . ANGIOPLASTY    . APPENDECTOMY    . BONE MARROW TRANSPLANT Left    ankle.  patient unsure of this but thinks this is what happened  . CARDIAC SURGERY    . CATARACT EXTRACTION W/PHACO Left 11/13/2015   Procedure: CATARACT EXTRACTION PHACO AND INTRAOCULAR LENS PLACEMENT (IOC);  Surgeon: [name], MD;  Location: [facility] ORS;  Service: Ophthalmology;  Laterality: Left;  [place] 1.06 AP% 23.9 CDE 15.81 Fluid pack lot # [id number]  . CATARACT EXTRACTION W/PHACO Right 11/27/2015   Procedure: CATARACT EXTRACTION PHACO AND INTRAOCULAR LENS PLACEMENT (IOC);  Surgeon: [name], MD;  Location: [facility] ORS;  Service: Ophthalmology;  Laterality: Right;  [place] 1.06 AP% 23.8 CDE 15.80 Fluid pack lot # [id number]  . COLONOSCOPY  2012   polyps, diverticulosis , 5yr repeat  . COLONOSCOPY WITH PROPOFOL N/A 07/17/2016   Procedure: COLONOSCOPY WITH PROPOFOL;  Surgeon: [name], MD;  Location: [facility] ENDOSCOPY;  Service: Endoscopy;  Laterality: N/A;  . FRACTURE SURGERY Left 1982   foot, leg.unsure if metal in these parts.possibly replaced ankle  . PERIPHERAL VASCULAR CATHETERIZATION Left 12/27/2015   Procedure: Lower Extremity Angiography;  Surgeon: Algernon Huxley, MD;  Location: [place] CV LAB;  Service: Cardiovascular;  Laterality: Left;    Current Medications: No outpatient medications have been marked as taking for the 06/04/20 encounter (Appointment) with Rise Mu, PA-C.    Allergies:   Patient has no  known allergies.   Social History   Socioeconomic History  . Marital status: Married    Spouse name: Not on file  . Number of children: Not on file  . Years of education: Not on file  . Highest education level: 9th grade  Occupational History  . Occupation: working full time   Tobacco Use  . Smoking status: Current Every Day Smoker    Packs/day: 1.00    Years: 55.00    Pack years: 55.00    Types: Cigarettes  . Smokeless tobacco: Never Used  Vaping Use  . Vaping Use: Never used   Substance and Sexual Activity  . Alcohol use: Yes    Alcohol/week: 28.0 standard drinks    Types: 28 Cans of beer per week    Comment: 4-6 daily beer  . Drug use: No  . Sexual activity: Not on file  Other Topics Concern  . Not on file  Social History Narrative   Retired   Investment banker, operational of Radio broadcast assistant Strain: Not on [employer] Insecurity: Not on file  Transportation Needs: Not on file  Physical Activity: Not on file  Stress: Not on file  Social Connections: Not on file     Family History:  The patient's family history includes Cancer in his mother; Heart disease in his father and mother; Hyperlipidemia in his father and mother.  ROS:   ROS   EKGs/Labs/Other Studies Reviewed:    Studies reviewed were summarized above. The additional studies were reviewed today:  2D echo 08/17/2019: 1. Left ventricular ejection fraction, by estimation, is 60 to 65%. The  left ventricle has normal function. The left ventricle has no regional  wall motion abnormalities. There is mild left ventricular hypertrophy.  Left ventricular diastolic parameters  were normal.  2. Right ventricular systolic function is normal. The right ventricular  size is normal.  3. The mitral valve is normal in structure. No evidence of mitral valve  regurgitation. No evidence of mitral stenosis.  4. The aortic valve is tricuspid. Aortic valve regurgitation is not  visualized. Mild aortic valve sclerosis is present, with no evidence of  aortic valve stenosis.  5. The inferior vena cava is normal in size with greater than 50%  respiratory variability, suggesting right atrial pressure of 3 mmHg. ____________  Coronary CTA 08/04/2019: Noncardiac overread - IMPRESSION:  1.  No acute findings in the imaged extracardiac chest. 2. Aortic atherosclerosis (ICD10-I70.0) and emphysema (ICD10-J43.9).  Cardiac overread - IMPRESSION: 1. Coronary calcium score of 702. This was 73rd percentile  for age and sex matched control.  2. Normal coronary origin with right dominance.  3.  Calcified plaque in the mid LAD causing moderate stenosis  4. Calcified plaques in the proximal and mid RCA, proximal and distal LAD, proximal and mid LCx all causing mild stenosis (25-49%).  5. CAD-RADS 3. Moderate stenosis. Consider symptom-guided anti-ischemic pharmacotherapy as well as risk factor modification per guideline directed care. Additional analysis with CT FFR will be submitted and reported separately.   FFR analysis:  1. Left Main:  No significant stenosis. 2. LAD: No significant stenosis. FFR 0.85 in the mid vessel and slowly tapers off to 0.78 distally (small distal vessel). 3. LCX: No significant stenosis.  FFR 0.88 4. RCA: No significant stenosis.  0.93  IMPRESSION: 1.  CT FFR analysis didn't show any significant stenosis.    EKG:  EKG is ordered today.  The EKG ordered today demonstrates ***  Recent Labs: 06/13/2019: Hemoglobin  14.0; Platelets 258 09/26/2019: ALT 31; BUN 25; Creatinine, Ser 1.21; Potassium 4.3; Sodium 139  Recent Lipid Panel    Component Value Date/Time   CHOL 100 09/26/2019 1603   CHOL 117 02/18/2018 1528   TRIG 182 (H) 09/26/2019 1603   TRIG 477 (H) 02/18/2018 1528   HDL 36 (L) 09/26/2019 1603   CHOLHDL 3.5 06/12/2016 1502   VLDL 39 (H) 02/09/2017 1527   LDLCALC 34 09/26/2019 1603    PHYSICAL EXAM:    VS:  There were no vitals taken for this visit.  BMI: There is no height or weight on file to calculate BMI.  Physical Exam  Wt Readings from Last 3 Encounters:  12/05/19 181 lb (82.1 kg)  11/01/19 186 lb (84.4 kg)  10/27/19 183 lb (83 kg)     ASSESSMENT & PLAN:   1. Nonobstructive CAD:  2. HTN: Blood pressure ***  3. HLD: LDL 45 from 01/2020 normal LFT at that time.  ***  4. PAD: Status post intervention by vascular surgery.  5. Tobacco use:  Disposition: F/u with Dr. Garen Lah or an APP in ***.   Medication  Adjustments/Labs and Tests Ordered: Current medicines are reviewed at length with the patient today.  Concerns regarding medicines are outlined above. Medication changes, Labs and Tests ordered today are summarized above and listed in the Patient Instructions accessible in Encounters.   Signed, Christell Faith, PA-C 05/31/2020 4:36 PM     [address] [place], [place] [address] [phone]

## 2020-06-04 ENCOUNTER — Ambulatory Visit: Payer: Medicare HMO | Admitting: Physician Assistant

## 2020-06-05 ENCOUNTER — Encounter: Payer: Self-pay | Admitting: Physician Assistant

## 2020-06-18 ENCOUNTER — Ambulatory Visit: Payer: Medicare HMO | Admitting: Cardiology

## 2020-06-18 ENCOUNTER — Encounter: Payer: Self-pay | Admitting: Cardiology

## 2020-06-18 ENCOUNTER — Other Ambulatory Visit: Payer: Self-pay

## 2020-06-18 VITALS — BP 150/70 | HR 73 | Ht 69.0 in | Wt 186.5 lb

## 2020-06-18 DIAGNOSIS — R69 Illness, unspecified: Secondary | ICD-10-CM | POA: Diagnosis not present

## 2020-06-18 DIAGNOSIS — I10 Essential (primary) hypertension: Secondary | ICD-10-CM | POA: Diagnosis not present

## 2020-06-18 DIAGNOSIS — I2510 Atherosclerotic heart disease of native coronary artery without angina pectoris: Secondary | ICD-10-CM

## 2020-06-18 DIAGNOSIS — IMO0001 Reserved for inherently not codable concepts without codable children: Secondary | ICD-10-CM

## 2020-06-18 DIAGNOSIS — F17200 Nicotine dependence, unspecified, uncomplicated: Secondary | ICD-10-CM

## 2020-06-18 NOTE — Patient Instructions (Signed)
Medication Instructions:   Call us with your Medication List (names and dosages) when you are able: [phone]. Dr. Garen Lah will review and make any necessary changes.   Lab Work: BMP to be drawn today.  If you have labs (blood work) drawn today and your tests are completely normal, you will receive your results only by: [name] MyChart Message (if you have MyChart) OR . A paper copy in the mail If you have any lab test that is abnormal or we need to change your treatment, we will call you to review the results.   Testing/Procedures: None ordered   Follow-Up: At [facility], you and your health needs are our priority.  As part of our continuing mission to provide you with exceptional heart care, we have created designated Provider Care Teams.  These Care Teams include your primary Cardiologist (physician) and Advanced Practice Providers (APPs -  Physician Assistants and Nurse Practitioners) who all work together to provide you with the care you need, when you need it.  We recommend signing up for the patient portal called "MyChart".  Sign up information is provided on this After Visit Summary.  MyChart is used to connect with patients for Virtual Visits (Telemedicine).  Patients are able to view lab/test results, encounter notes, upcoming appointments, etc.  Non-urgent messages can be sent to your provider as well.   To learn more about what you can do with MyChart, go to [email or web address].    Your next appointment:   3 month(s)  The format for your next appointment:   In Person  Provider:   Kate Sable, MD   Other Instructions

## 2020-06-18 NOTE — Progress Notes (Signed)
Cardiology Office Note:    Date:  06/18/2020   ID:  William Ball, DOB 1945/10/11, MRN [id number]  PCP:  Patient, No Pcp Per  Cardiologist:  Kate Sable, MD  Electrophysiologist:  None   Referring MD: No ref. provider found   Chief Complaint  Patient presents with  . 6 month follow up    "doing well." Medications reviewed by the patient's med list.     History of Present Illness:    William Ball is a 75 y.o. male with a hx of CAD (CCTA Ca2+ 66, mod mLAD, mild mRCA, LCx) , diabetes, hyperlipidemia, hypertension, current smoker x50+ years who presents for follow-up.    Patient presenting for follow-up.  Being seen due to nonobstructive CAD and hypertension.  Plavix previously stopped due to easily bruising.  Tolerating aspirin, statin.  Previous LDL at goal.  Doing well, has no concerns at this time.  States blood pressures at home running in the 176H to 607P systolic.  He takes for blood pressure medications, not sure of his doses.  Prior notes. Coronary CTA 08/2019 showed calcium score of 702.  Calcified plaques in the mid LAD causing moderate stenosis, calcified plaque in the proximal and mid RCA, proximal LAD, mid left circumflex causing mild stenosis.  CT FFR with no significant obstruction.   Echocardiogram 08/2019 showed normal systolic and diastolic function, mild LVH, EF 60 to 65%   Past Medical History:  Diagnosis Date  . Diabetes mellitus without complication (HCC)    borderline  . Hepatomegaly   . Hyperlipidemia   . Hypertension   . Substance abuse [facility])     Past Surgical History:  Procedure Laterality Date  . ANGIOPLASTY    . APPENDECTOMY    . BONE MARROW TRANSPLANT Left    ankle.  patient unsure of this but thinks this is what happened  . CARDIAC SURGERY    . CATARACT EXTRACTION W/PHACO Left 11/13/2015   Procedure: CATARACT EXTRACTION PHACO AND INTRAOCULAR LENS PLACEMENT (IOC);  Surgeon: [name], MD;  Location: [facility] ORS;  Service: Ophthalmology;   Laterality: Left;  [place] 1.06 AP% 23.9 CDE 15.81 Fluid pack lot # [id number]  . CATARACT EXTRACTION W/PHACO Right 11/27/2015   Procedure: CATARACT EXTRACTION PHACO AND INTRAOCULAR LENS PLACEMENT (IOC);  Surgeon: [name], MD;  Location: [facility] ORS;  Service: Ophthalmology;  Laterality: Right;  [place] 1.06 AP% 23.8 CDE 15.80 Fluid pack lot # [id number]  . COLONOSCOPY  2012   polyps, diverticulosis , 45yr repeat  . COLONOSCOPY WITH PROPOFOL N/A 07/17/2016   Procedure: COLONOSCOPY WITH PROPOFOL;  Surgeon: [name], MD;  Location: [facility] ENDOSCOPY;  Service: Endoscopy;  Laterality: N/A;  . FRACTURE SURGERY Left 1982   foot, leg.unsure if metal in these parts.possibly replaced ankle  . PERIPHERAL VASCULAR CATHETERIZATION Left 12/27/2015   Procedure: Lower Extremity Angiography;  Surgeon: Algernon Huxley, MD;  Location: [place] CV LAB;  Service: Cardiovascular;  Laterality: Left;    Current Medications: Current Meds  Medication Sig  . amLODipine-benazepril (LOTREL) 5-10 MG capsule Take 1 capsule by mouth daily.  [name] atorvastatin (LIPITOR) 20 MG tablet TAKE 1 TABLET BY MOUTH EVERY DAY  . chlorthalidone (HYGROTON) 50 MG tablet Take 1 tablet (50 mg total) by mouth daily.  [name] glipiZIDE (GLUCOTROL) 10 MG tablet TAKE 1 TABLET (10 MG TOTAL) BY MOUTH 2 (TWO) TIMES DAILY BEFORE A MEAL.  [name] Omega-3 Fatty Acids (FISH OIL PO) Take by mouth daily.     Allergies:   Patient  has no known allergies.   Social History   Socioeconomic History  . Marital status: Married    Spouse name: Not on file  . Number of children: Not on file  . Years of education: Not on file  . Highest education level: 9th grade  Occupational History  . Occupation: working full time   Tobacco Use  . Smoking status: Current Every Day Smoker    Packs/day: 1.00    Years: 55.00    Pack years: 55.00    Types: Cigarettes  . Smokeless tobacco: Never Used  Vaping Use  . Vaping Use: Never used  Substance and Sexual Activity  . Alcohol  use: Yes    Alcohol/week: 28.0 standard drinks    Types: 28 Cans of beer per week    Comment: 4-6 daily beer  . Drug use: No  . Sexual activity: Not on file  Other Topics Concern  . Not on file  Social History Narrative   Retired   Investment banker, operational of Radio broadcast assistant Strain: Not on [employer] Insecurity: Not on file  Transportation Needs: Not on file  Physical Activity: Not on file  Stress: Not on file  Social Connections: Not on file     Family History: The patient's family history includes Cancer in his mother; Heart disease in his father and mother; Hyperlipidemia in his father and mother.  ROS:   Please see the history of present illness.     All other systems reviewed and are negative.  EKGs/Labs/Other Studies Reviewed:    The following studies were reviewed today:   EKG:  EKG is  ordered today.  The ekg ordered today demonstrates normal sinus rhythm, first-degree AV block  Recent Labs: 09/26/2019: ALT 31; BUN 25; Creatinine, Ser 1.21; Potassium 4.3; Sodium 139  Recent Lipid Panel    Component Value Date/Time   CHOL 100 09/26/2019 1603   CHOL 117 02/18/2018 1528   TRIG 182 (H) 09/26/2019 1603   TRIG 477 (H) 02/18/2018 1528   HDL 36 (L) 09/26/2019 1603   CHOLHDL 3.5 06/12/2016 1502   VLDL 39 (H) 02/09/2017 1527   LDLCALC 34 09/26/2019 1603    Physical Exam:    VS:  BP (!) 150/70 (BP Location: Left Arm, Patient Position: Sitting, Cuff Size: Normal)   Pulse 73   Ht 5\' 9"  (1.753 m)   Wt 186 lb 8 oz (84.6 kg)   SpO2 97%   BMI 27.54 kg/m     Wt Readings from Last 3 Encounters:  06/18/20 186 lb 8 oz (84.6 kg)  12/05/19 181 lb (82.1 kg)  11/01/19 186 lb (84.4 kg)     GEN:  Well nourished, well developed in no acute distress HEENT: Normal NECK: No JVD; No carotid bruits LYMPHATICS: No lymphadenopathy CARDIAC: rrr, no murmurs, rubs, gallops RESPIRATORY:  Clear to auscultation without rales, wheezing or rhonchi  ABDOMEN: Soft,  non-tender, non-distended MUSCULOSKELETAL:  No edema; No deformity  SKIN: Warm and dry NEUROLOGIC:  Alert and oriented x 3  PSYCHIATRIC:  Normal affect   ASSESSMENT:    1. Coronary artery disease involving native coronary artery of native heart without angina pectoris   2. Essential hypertension   3. Smoking    PLAN:    In order of problems listed above:  1. Patient with history of nonobstructive CAD, calcium score of 702.  Echocardiogram shows normal systolic and diastolic function, EF 40%.  Continue aspirin and Lipitor 20mg  2. Hypertension, BP elevated.  Patient  will call us with his current BP meds and doses.  Based on medication list, will titrate medications to adequately manage blood pressure.  He is on chlorthalidone 50 mg daily.  Check BMP today to evaluate electrolytes and kidney function. 3. Current smoker, smoking cessation advised   Follow-up in 3 months  Total encounter time 35 minutes  Greater than 50% was spent in counseling and coordination of care with the patient   Medication Adjustments/Labs and Tests Ordered: Current medicines are reviewed at length with the patient today.  Concerns regarding medicines are outlined above.  Orders Placed This Encounter  Procedures  . Basic metabolic panel  . EKG 12-Lead   No orders of the defined types were placed in this encounter.   Patient Instructions  Medication Instructions:   Call us with your Medication List (names and dosages) when you are able: [phone]. Dr. Garen Lah will review and make any necessary changes.   Lab Work: BMP to be drawn today.  If you have labs (blood work) drawn today and your tests are completely normal, you will receive your results only by: [name] MyChart Message (if you have MyChart) OR . A paper copy in the mail If you have any lab test that is abnormal or we need to change your treatment, we will call you to review the results.   Testing/Procedures: None  ordered   Follow-Up: At [facility], you and your health needs are our priority.  As part of our continuing mission to provide you with exceptional heart care, we have created designated Provider Care Teams.  These Care Teams include your primary Cardiologist (physician) and Advanced Practice Providers (APPs -  Physician Assistants and Nurse Practitioners) who all work together to provide you with the care you need, when you need it.  We recommend signing up for the patient portal called "MyChart".  Sign up information is provided on this After Visit Summary.  MyChart is used to connect with patients for Virtual Visits (Telemedicine).  Patients are able to view lab/test results, encounter notes, upcoming appointments, etc.  Non-urgent messages can be sent to your provider as well.   To learn more about what you can do with MyChart, go to [email or web address].    Your next appointment:   3 month(s)  The format for your next appointment:   In Person  Provider:   Kate Sable, MD   Other Instructions      Signed, Kate Sable, MD  06/18/2020 4:39 PM    [place]

## 2020-06-19 ENCOUNTER — Telehealth: Payer: Self-pay | Admitting: Cardiology

## 2020-06-19 LAB — BASIC METABOLIC PANEL WITH GFR
BUN/Creatinine Ratio: 17 (ref 10–24)
BUN: 21 mg/dL (ref 8–27)
CO2: 18 mmol/L — ABNORMAL LOW (ref 20–29)
Calcium: 9.4 mg/dL (ref 8.6–10.2)
Chloride: 99 mmol/L (ref 96–106)
Creatinine, Ser: 1.23 mg/dL (ref 0.76–1.27)
GFR calc Af Amer: 66 mL/min/1.73
GFR calc non Af Amer: 57 mL/min/1.73 — ABNORMAL LOW
Glucose: 303 mg/dL — ABNORMAL HIGH (ref 65–99)
Potassium: 4.1 mmol/L (ref 3.5–5.2)
Sodium: 134 mmol/L (ref 134–144)

## 2020-06-19 NOTE — Telephone Encounter (Signed)
Called patient and informed him Dr. Garen Lah would likely review the medication list in the next two days and that I would call him back with any recommended changes. Patient was grateful for the call back.

## 2020-06-19 NOTE — Telephone Encounter (Signed)
Patient calling with medication update:  Atorvastatin 20 MG 1 daily Chlorthalidone 50 MG 1 daily Amlodipine - benazepril 5-10 MG 1 daily Hydralazine 50 MG 1 daily Glipizide 10 MG 1 in morning 1 at night   Please call to discuss

## 2020-06-21 DIAGNOSIS — Z7984 Long term (current) use of oral hypoglycemic drugs: Secondary | ICD-10-CM | POA: Diagnosis not present

## 2020-06-21 DIAGNOSIS — E785 Hyperlipidemia, unspecified: Secondary | ICD-10-CM | POA: Diagnosis not present

## 2020-06-21 DIAGNOSIS — Z7982 Long term (current) use of aspirin: Secondary | ICD-10-CM | POA: Diagnosis not present

## 2020-06-21 DIAGNOSIS — Z833 Family history of diabetes mellitus: Secondary | ICD-10-CM | POA: Diagnosis not present

## 2020-06-21 DIAGNOSIS — I2510 Atherosclerotic heart disease of native coronary artery without angina pectoris: Secondary | ICD-10-CM | POA: Diagnosis not present

## 2020-06-21 DIAGNOSIS — I10 Essential (primary) hypertension: Secondary | ICD-10-CM | POA: Diagnosis not present

## 2020-06-21 DIAGNOSIS — E1151 Type 2 diabetes mellitus with diabetic peripheral angiopathy without gangrene: Secondary | ICD-10-CM | POA: Diagnosis not present

## 2020-06-21 DIAGNOSIS — Z8249 Family history of ischemic heart disease and other diseases of the circulatory system: Secondary | ICD-10-CM | POA: Diagnosis not present

## 2020-06-21 DIAGNOSIS — Z008 Encounter for other general examination: Secondary | ICD-10-CM | POA: Diagnosis not present

## 2020-06-21 DIAGNOSIS — R69 Illness, unspecified: Secondary | ICD-10-CM | POA: Diagnosis not present

## 2020-06-21 DIAGNOSIS — Z803 Family history of malignant neoplasm of breast: Secondary | ICD-10-CM | POA: Diagnosis not present

## 2020-06-21 MED ORDER — AMLODIPINE BESY-BENAZEPRIL HCL 10-40 MG PO CAPS: 1.0000 | ORAL_CAPSULE | Freq: Every day | ORAL | 5 refills | Status: DC

## 2020-06-21 NOTE — Telephone Encounter (Signed)
Spoke with patient and relayed the below copied and pasted recommendation from Dr. Garen Lah. Patient wrote down all the information and agreed to call with updated BP's in 1-2 weeks.   Stop hydralazine, prescribed Lotrel 10--40 mg daily capsule. Stop Lotrel 5-10 mg capsule. have patient check blood pressure frequently at home, let us know in about 1 to 2 weeks how her blood pressure is running. Thank you

## 2020-06-26 ENCOUNTER — Other Ambulatory Visit: Payer: Self-pay | Admitting: Family Medicine

## 2020-06-26 NOTE — Telephone Encounter (Signed)
No longer our pt °

## 2020-06-26 NOTE — Telephone Encounter (Signed)
Requested medications are due for refill today.  yes  Requested medications are on the active medications list.  yes  Last refill. 12/26/2019  Future visit scheduled.   no  Notes to clinic.  Pt was last seen by Merrie Roof. Has not been seen at the office in a long time.

## 2020-07-03 ENCOUNTER — Telehealth: Payer: Self-pay | Admitting: Cardiology

## 2020-07-03 NOTE — Telephone Encounter (Signed)
Patient states he has diarrhea and thinks it may be coming from his medication-Amlodipine. Please call to discuss. Pt c/o medication issue:  1. Name of Medication: Amlodipine  2. How are you currently taking this medication (dosage and times per day)?  10-40 mg capsule dailey  3. Are you having a reaction (difficulty breathing--STAT)? No, just watery diarrhea  4. What is your medication issue?  diarrhea  Patient states he is still taking this medication

## 2020-07-05 MED ORDER — BENAZEPRIL HCL 40 MG PO TABS: 40.0000 mg | ORAL_TABLET | Freq: Every day | ORAL | 5 refills | Status: DC

## 2020-07-05 MED ORDER — CHLORTHALIDONE 50 MG PO TABS: 50.0000 mg | ORAL_TABLET | Freq: Every day | ORAL | 3 refills | Status: DC

## 2020-07-05 NOTE — Telephone Encounter (Signed)
Called patient and informed him of the below recommendation. Patient also requested a refill of his Chlorthalidone. Patient stated that he is still having several episodes of diarrhea a day. He will stop the Lotrel immediately and start Benazepril as ordered. I advised to increase his fluid intake while having the diarrhea and encouraged drinking a sports drink or Gatorade. Patient verbalized understanding and agreed with plan.  Kate Sable, MD  You 18 hours ago (5:30 PM)   Have patient stop combination pill of amlodipine and benazepril. Prescribed benazepril 40 mg daily only. Have patient monitor symptoms and blood pressure. Let us know if blood pressure stays elevated.   Routing comment

## 2020-07-18 ENCOUNTER — Other Ambulatory Visit: Payer: Self-pay | Admitting: Family Medicine

## 2020-07-18 NOTE — Telephone Encounter (Signed)
Requested medications are due for refill today.  yes  Requested medications are on the active medications list.  yes  Last refill. 04/09/2020  Future visit scheduled.   no  Notes to clinic. Has not been in to office since 09/23/2019 last saw William Ball and has not established care with another PCP.

## 2020-07-19 NOTE — Telephone Encounter (Signed)
Not our pt

## 2020-07-22 IMAGING — CT CT HEART MORP W/ CTA COR W/ SCORE W/ CA W/CM &/OR W/O CM
1 of 13 series · 4 of 20 positions shown, 5 images · non-contrast
Comparison: Lung cancer screening CT of 10/08/2018

Addendum:
CLINICAL DATA: Atypical angina

EXAM:
Cardiac/Coronary  CTA
TECHNIQUE: The patient was scanned on a Siemens Somatoform go.Top scanner.

[Series 40: ms multiphase cta coronary 0.60 · axial · 0.40mm/px · z∈[-1492,-1405]mm · 4 of 3276 slices shown, 5 images]
[im 656/3276  vessel]
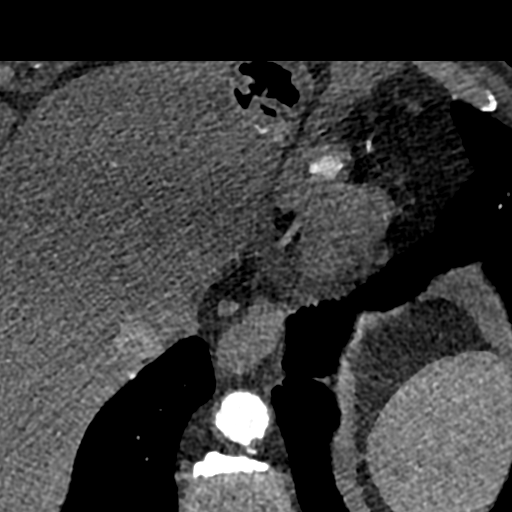
[im 656/3276  lung]
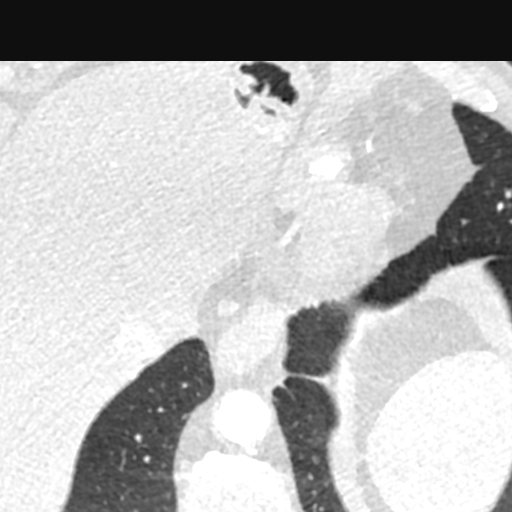
[im 1311/3276  vessel]
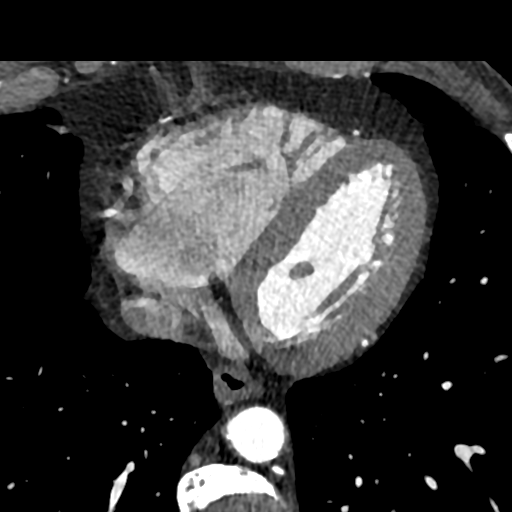
[im 1966/3276  vessel]
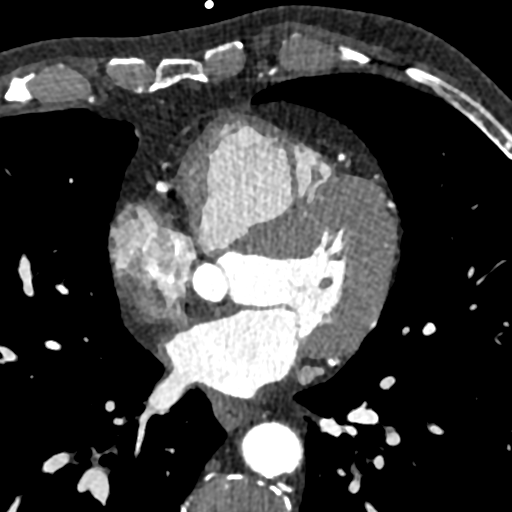
[im 2621/3276  vessel]
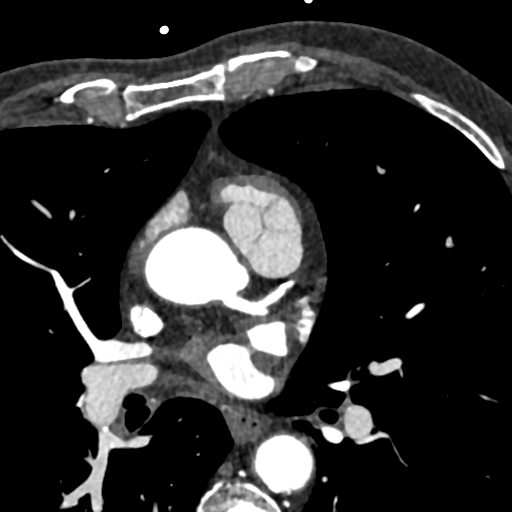

[4 of 20 positions shown; findings below may reference images not displayed]

FINDINGS: A retrospective scan was triggered in the descending thoracic aorta.
Axial non-contrast 3 mm slices were carried out through the heart.
The data set was analyzed on a dedicated work station and scored
using the Agatson method. Gantry rotation speed was 330 msecs and
collimation was .6 mm. 15mg of metoprolol and 0.8 mg of sl NTG was
given. The 3D data set was reconstructed in 5% intervals of the
60-95 % of the R-R cycle. Diastolic phases were analyzed on a
dedicated work station using MPR, MIP and VRT modes. The patient
received 85 cc of contrast.

Aorta: Normal size. Mild ascending and descending aortic wall
calcifications. No dissection.

Aortic Valve:  Trileaflet.  mild calcifications.

Coronary Arteries:  Normal coronary origin.  Right dominance.

RCA is a large dominant artery that gives rise to PDA and PLA. There
calcified plaque in the proximal and mid RCA causing mild stenosis
(25-49%).

Left main is a large artery that gives rise to LAD and LCX arteries.

LAD is a large vessel that has calcified plaque in the proximal
segment causing mild stenosis (25-49%). There is calcified plaque in
the mid segment causing moderate (50-69%) stenosis and calcified
plaque in the distal segment causing mild stenosis (25-49%).

LCX is a non-dominant artery that gives rise to two OM branches.
There is calcified plaque in the proximal and mid segments causing
mild stenosis (25-49%).

Other findings:

Normal pulmonary vein drainage into the left atrium.

Normal left atrial appendage without a thrombus.

Normal size of the pulmonary artery.
IMPRESSION: 1. Coronary calcium score of 702. This was 73rd percentile for age
and sex matched control.

2. Normal coronary origin with right dominance.

3.  Calcified plaque in the mid LAD causing moderate stenosis

4. Calcified plaques in the proximal and mid RCA, proximal and
distal LAD, proximal and mid LCx all causing mild stenosis (25-49%).

5. CAD-RADS 3. Moderate stenosis. Consider symptom-guided
anti-ischemic pharmacotherapy as well as risk factor modification
per guideline directed care. Additional analysis with CT FFR will be
submitted and reported separately.

ADDENDUM:
OVER-READ INTERPRETATION  CT CHEST

The following report is an over-read performed by radiologist Dr.
does not include interpretation of cardiac or coronary anatomy or
pathology. The coronary CTA interpretation by the cardiologist is
attached.
FINDINGS: Cardiovascular: Aortic atherosclerosis. No central pulmonary
embolism, on this non-dedicated study.

Mediastinum/Nodes: No imaged thoracic adenopathy. Calcified
mediastinal nodes are likely related to old granulomatous disease.

Lungs/Pleura: No pleural fluid. Mild centrilobular emphysema.
Lingular calcified granuloma.

Upper Abdomen: Old granulomatous disease in the spleen. Normal
imaged portions of the liver, stomach.

Musculoskeletal: Thoracic spondylosis.
IMPRESSION: 1.  No acute findings in the imaged extracardiac chest.
2. Aortic atherosclerosis (2074O-6WT.T) and emphysema (2074O-3T3.L).

*** End of Addendum ***
FINDINGS: A retrospective scan was triggered in the descending thoracic aorta.
Axial non-contrast 3 mm slices were carried out through the heart.
The data set was analyzed on a dedicated work station and scored
using the Agatson method. Gantry rotation speed was 330 msecs and
collimation was .6 mm. 15mg of metoprolol and 0.8 mg of sl NTG was
given. The 3D data set was reconstructed in 5% intervals of the
60-95 % of the R-R cycle. Diastolic phases were analyzed on a
dedicated work station using MPR, MIP and VRT modes. The patient
received 85 cc of contrast.

Aorta: Normal size. Mild ascending and descending aortic wall
calcifications. No dissection.

Aortic Valve:  Trileaflet.  mild calcifications.

Coronary Arteries:  Normal coronary origin.  Right dominance.

RCA is a large dominant artery that gives rise to PDA and PLA. There
calcified plaque in the proximal and mid RCA causing mild stenosis
(25-49%).

Left main is a large artery that gives rise to LAD and LCX arteries.

LAD is a large vessel that has calcified plaque in the proximal
segment causing mild stenosis (25-49%). There is calcified plaque in
the mid segment causing moderate (50-69%) stenosis and calcified
plaque in the distal segment causing mild stenosis (25-49%).

LCX is a non-dominant artery that gives rise to two OM branches.
There is calcified plaque in the proximal and mid segments causing
mild stenosis (25-49%).

Other findings:

Normal pulmonary vein drainage into the left atrium.

Normal left atrial appendage without a thrombus.

Normal size of the pulmonary artery.
IMPRESSION: 1. Coronary calcium score of 702. This was 73rd percentile for age
and sex matched control.

2. Normal coronary origin with right dominance.

3.  Calcified plaque in the mid LAD causing moderate stenosis

4. Calcified plaques in the proximal and mid RCA, proximal and
distal LAD, proximal and mid LCx all causing mild stenosis (25-49%).

5. CAD-RADS 3. Moderate stenosis. Consider symptom-guided
anti-ischemic pharmacotherapy as well as risk factor modification
per guideline directed care. Additional analysis with CT FFR will be
submitted and reported separately.

## 2020-07-28 DIAGNOSIS — I10 Essential (primary) hypertension: Secondary | ICD-10-CM | POA: Diagnosis not present

## 2020-07-28 DIAGNOSIS — E785 Hyperlipidemia, unspecified: Secondary | ICD-10-CM | POA: Diagnosis not present

## 2020-07-28 DIAGNOSIS — R7989 Other specified abnormal findings of blood chemistry: Secondary | ICD-10-CM | POA: Diagnosis not present

## 2020-07-28 DIAGNOSIS — R69 Illness, unspecified: Secondary | ICD-10-CM | POA: Diagnosis not present

## 2020-07-28 DIAGNOSIS — E119 Type 2 diabetes mellitus without complications: Secondary | ICD-10-CM | POA: Diagnosis not present

## 2020-07-28 DIAGNOSIS — I440 Atrioventricular block, first degree: Secondary | ICD-10-CM | POA: Diagnosis not present

## 2020-07-28 DIAGNOSIS — I2510 Atherosclerotic heart disease of native coronary artery without angina pectoris: Secondary | ICD-10-CM | POA: Diagnosis not present

## 2020-07-28 DIAGNOSIS — Z86718 Personal history of other venous thrombosis and embolism: Secondary | ICD-10-CM | POA: Diagnosis not present

## 2020-07-28 DIAGNOSIS — R197 Diarrhea, unspecified: Secondary | ICD-10-CM | POA: Diagnosis not present

## 2020-07-28 DIAGNOSIS — I4510 Unspecified right bundle-branch block: Secondary | ICD-10-CM | POA: Diagnosis not present

## 2020-08-02 DIAGNOSIS — D692 Other nonthrombocytopenic purpura: Secondary | ICD-10-CM | POA: Diagnosis not present

## 2020-08-02 DIAGNOSIS — I129 Hypertensive chronic kidney disease with stage 1 through stage 4 chronic kidney disease, or unspecified chronic kidney disease: Secondary | ICD-10-CM | POA: Diagnosis not present

## 2020-08-02 DIAGNOSIS — Z0000 Encounter for general adult medical examination without abnormal findings: Secondary | ICD-10-CM | POA: Diagnosis not present

## 2020-08-02 DIAGNOSIS — J439 Emphysema, unspecified: Secondary | ICD-10-CM | POA: Diagnosis not present

## 2020-08-02 DIAGNOSIS — I700 Atherosclerosis of aorta: Secondary | ICD-10-CM | POA: Diagnosis not present

## 2020-08-02 DIAGNOSIS — N183 Chronic kidney disease, stage 3 unspecified: Secondary | ICD-10-CM | POA: Diagnosis not present

## 2020-08-02 DIAGNOSIS — E1122 Type 2 diabetes mellitus with diabetic chronic kidney disease: Secondary | ICD-10-CM | POA: Diagnosis not present

## 2020-08-02 DIAGNOSIS — K521 Toxic gastroenteritis and colitis: Secondary | ICD-10-CM | POA: Diagnosis not present

## 2020-08-02 DIAGNOSIS — I77811 Abdominal aortic ectasia: Secondary | ICD-10-CM | POA: Diagnosis not present

## 2020-08-02 DIAGNOSIS — E785 Hyperlipidemia, unspecified: Secondary | ICD-10-CM | POA: Diagnosis not present

## 2020-08-02 DIAGNOSIS — E1151 Type 2 diabetes mellitus with diabetic peripheral angiopathy without gangrene: Secondary | ICD-10-CM | POA: Diagnosis not present

## 2020-08-03 DIAGNOSIS — I129 Hypertensive chronic kidney disease with stage 1 through stage 4 chronic kidney disease, or unspecified chronic kidney disease: Secondary | ICD-10-CM | POA: Diagnosis not present

## 2020-08-03 DIAGNOSIS — R197 Diarrhea, unspecified: Secondary | ICD-10-CM | POA: Diagnosis not present

## 2020-08-03 DIAGNOSIS — E785 Hyperlipidemia, unspecified: Secondary | ICD-10-CM | POA: Diagnosis not present

## 2020-08-03 DIAGNOSIS — E1122 Type 2 diabetes mellitus with diabetic chronic kidney disease: Secondary | ICD-10-CM | POA: Diagnosis not present

## 2020-08-03 DIAGNOSIS — D692 Other nonthrombocytopenic purpura: Secondary | ICD-10-CM | POA: Diagnosis not present

## 2020-08-03 DIAGNOSIS — N183 Chronic kidney disease, stage 3 unspecified: Secondary | ICD-10-CM | POA: Diagnosis not present

## 2020-08-06 DIAGNOSIS — R197 Diarrhea, unspecified: Secondary | ICD-10-CM | POA: Diagnosis not present

## 2020-08-21 ENCOUNTER — Other Ambulatory Visit: Payer: Self-pay | Admitting: Family Medicine

## 2020-08-21 DIAGNOSIS — E785 Hyperlipidemia, unspecified: Secondary | ICD-10-CM

## 2020-09-17 ENCOUNTER — Ambulatory Visit: Payer: Medicare HMO | Admitting: Cardiology

## 2020-09-24 DIAGNOSIS — I359 Nonrheumatic aortic valve disorder, unspecified: Secondary | ICD-10-CM | POA: Diagnosis not present

## 2020-09-24 DIAGNOSIS — J439 Emphysema, unspecified: Secondary | ICD-10-CM | POA: Diagnosis not present

## 2020-09-29 ENCOUNTER — Other Ambulatory Visit: Payer: Self-pay | Admitting: Family Medicine

## 2020-09-29 DIAGNOSIS — E785 Hyperlipidemia, unspecified: Secondary | ICD-10-CM

## 2020-10-03 DIAGNOSIS — N183 Chronic kidney disease, stage 3 unspecified: Secondary | ICD-10-CM | POA: Diagnosis not present

## 2020-10-03 DIAGNOSIS — E1165 Type 2 diabetes mellitus with hyperglycemia: Secondary | ICD-10-CM | POA: Diagnosis not present

## 2020-10-03 DIAGNOSIS — J439 Emphysema, unspecified: Secondary | ICD-10-CM | POA: Diagnosis not present

## 2020-10-03 DIAGNOSIS — M1610 Unilateral primary osteoarthritis, unspecified hip: Secondary | ICD-10-CM | POA: Diagnosis not present

## 2020-10-03 DIAGNOSIS — I129 Hypertensive chronic kidney disease with stage 1 through stage 4 chronic kidney disease, or unspecified chronic kidney disease: Secondary | ICD-10-CM | POA: Diagnosis not present

## 2020-10-03 DIAGNOSIS — E1122 Type 2 diabetes mellitus with diabetic chronic kidney disease: Secondary | ICD-10-CM | POA: Diagnosis not present

## 2020-10-17 DIAGNOSIS — E119 Type 2 diabetes mellitus without complications: Secondary | ICD-10-CM | POA: Diagnosis not present

## 2020-10-20 ENCOUNTER — Other Ambulatory Visit: Payer: Self-pay | Admitting: Family Medicine

## 2020-10-20 DIAGNOSIS — E785 Hyperlipidemia, unspecified: Secondary | ICD-10-CM

## 2020-10-20 NOTE — Telephone Encounter (Signed)
No PCP listed  End date with [facility] 10/31/19 Last OV 09/26/19.  Pt is due refill of atorvastatin. Last RF 04/15/20.   Requested Prescriptions  Pending Prescriptions Disp Refills   atorvastatin (LIPITOR) 20 MG tablet [Pharmacy Med Name: ATORVASTATIN 20 MG TABLET] 90 tablet 1    Sig: TAKE 1 TABLET BY MOUTH EVERY DAY      Cardiovascular:  Antilipid - Statins Failed - 10/20/2020 11:18 AM      Failed - Total Cholesterol in normal range and within 360 days    Cholesterol, Total  Date Value Ref Range Status  09/26/2019 100 100 - 199 mg/dL Final   Cholesterol Piccolo, Waived  Date Value Ref Range Status  02/18/2018 117 <200 mg/dL Final    Comment:                            Desirable                <200                         Borderline High      200- 239                         High                     >239           Failed - LDL in normal range and within 360 days    LDL Chol Calc (NIH)  Date Value Ref Range Status  09/26/2019 34 0 - 99 mg/dL Final          Failed - HDL in normal range and within 360 days    HDL  Date Value Ref Range Status  09/26/2019 36 (L) >39 mg/dL Final          Failed - Triglycerides in normal range and within 360 days    Triglycerides  Date Value Ref Range Status  09/26/2019 182 (H) 0 - 149 mg/dL Final   Triglycerides Piccolo,Waived  Date Value Ref Range Status  02/18/2018 477 (H) <150 mg/dL Final    Comment:                            Normal                   <150                         Borderline High     150 - 199                         High                200 - 499                         Very High                >499           Failed - Valid encounter within last 12 months    Recent Outpatient Visits           1 year ago Hypertension associated with diabetes ([place])   [facility] [place], [place]   1 year ago Hypertension associated with diabetes [facility])   [facility] [place], [place]    1 year ago Type 2 DM with CKD stage 3 and hypertension [facility])   [place], [place], [place]   1 year ago Hypertension associated with diabetes [facility])   [facility] [place], [place]   1 year ago Type 2 DM with CKD stage 3 and hypertension [facility])   [place], [place], [place]                Passed - Patient is not pregnant

## 2020-10-22 ENCOUNTER — Other Ambulatory Visit: Payer: Self-pay | Admitting: Family Medicine

## 2020-10-22 DIAGNOSIS — E785 Hyperlipidemia, unspecified: Secondary | ICD-10-CM

## 2020-10-22 NOTE — Telephone Encounter (Signed)
No longer our pt °

## 2020-10-22 NOTE — Telephone Encounter (Signed)
Pt no longer under prescriber's care

## 2020-11-01 DIAGNOSIS — Z794 Long term (current) use of insulin: Secondary | ICD-10-CM | POA: Diagnosis not present

## 2020-11-01 DIAGNOSIS — E1122 Type 2 diabetes mellitus with diabetic chronic kidney disease: Secondary | ICD-10-CM | POA: Diagnosis not present

## 2020-11-01 DIAGNOSIS — J439 Emphysema, unspecified: Secondary | ICD-10-CM | POA: Diagnosis not present

## 2020-11-01 DIAGNOSIS — I129 Hypertensive chronic kidney disease with stage 1 through stage 4 chronic kidney disease, or unspecified chronic kidney disease: Secondary | ICD-10-CM | POA: Diagnosis not present

## 2020-11-01 DIAGNOSIS — R69 Illness, unspecified: Secondary | ICD-10-CM | POA: Diagnosis not present

## 2020-11-01 DIAGNOSIS — N183 Chronic kidney disease, stage 3 unspecified: Secondary | ICD-10-CM | POA: Diagnosis not present

## 2020-11-01 DIAGNOSIS — I77811 Abdominal aortic ectasia: Secondary | ICD-10-CM | POA: Diagnosis not present

## 2021-02-01 DIAGNOSIS — Z794 Long term (current) use of insulin: Secondary | ICD-10-CM | POA: Diagnosis not present

## 2021-02-01 DIAGNOSIS — Z23 Encounter for immunization: Secondary | ICD-10-CM | POA: Diagnosis not present

## 2021-02-01 DIAGNOSIS — N183 Chronic kidney disease, stage 3 unspecified: Secondary | ICD-10-CM | POA: Diagnosis not present

## 2021-02-01 DIAGNOSIS — E1122 Type 2 diabetes mellitus with diabetic chronic kidney disease: Secondary | ICD-10-CM | POA: Diagnosis not present

## 2021-02-01 DIAGNOSIS — J439 Emphysema, unspecified: Secondary | ICD-10-CM | POA: Diagnosis not present

## 2021-02-01 DIAGNOSIS — Z87891 Personal history of nicotine dependence: Secondary | ICD-10-CM | POA: Diagnosis not present

## 2021-02-01 DIAGNOSIS — I129 Hypertensive chronic kidney disease with stage 1 through stage 4 chronic kidney disease, or unspecified chronic kidney disease: Secondary | ICD-10-CM | POA: Diagnosis not present

## 2021-02-19 ENCOUNTER — Other Ambulatory Visit: Payer: Self-pay | Admitting: *Deleted

## 2021-02-19 DIAGNOSIS — F17210 Nicotine dependence, cigarettes, uncomplicated: Secondary | ICD-10-CM

## 2021-02-19 DIAGNOSIS — Z87891 Personal history of nicotine dependence: Secondary | ICD-10-CM

## 2021-02-28 ENCOUNTER — Ambulatory Visit
Admission: RE | Admit: 2021-02-28 | Discharge: 2021-02-28 | Disposition: A | Discharge status: Home / Self Care | Payer: Medicare HMO | Source: Ambulatory Visit | Attending: Acute Care | Admitting: Acute Care

## 2021-02-28 ENCOUNTER — Other Ambulatory Visit: Payer: Self-pay

## 2021-02-28 DIAGNOSIS — F17210 Nicotine dependence, cigarettes, uncomplicated: Secondary | ICD-10-CM | POA: Diagnosis present

## 2021-02-28 DIAGNOSIS — R69 Illness, unspecified: Secondary | ICD-10-CM | POA: Diagnosis not present

## 2021-02-28 DIAGNOSIS — Z87891 Personal history of nicotine dependence: Secondary | ICD-10-CM | POA: Insufficient documentation

## 2021-03-13 ENCOUNTER — Other Ambulatory Visit: Payer: Self-pay | Admitting: Acute Care

## 2021-03-13 DIAGNOSIS — F17210 Nicotine dependence, cigarettes, uncomplicated: Secondary | ICD-10-CM

## 2021-05-27 ENCOUNTER — Other Ambulatory Visit: Payer: Self-pay | Admitting: *Deleted

## 2021-05-27 MED ORDER — CHLORTHALIDONE 50 MG PO TABS: 50.0000 mg | ORAL_TABLET | Freq: Every day | ORAL | 0 refills | Status: AC

## 2022-02-28 ENCOUNTER — Ambulatory Visit
Admission: RE | Admit: 2022-02-28 | Discharge: 2022-02-28 | Disposition: A | Discharge status: Home / Self Care | Payer: Medicare HMO | Source: Ambulatory Visit | Attending: Acute Care | Admitting: Acute Care

## 2022-02-28 DIAGNOSIS — N62 Hypertrophy of breast: Secondary | ICD-10-CM | POA: Diagnosis not present

## 2022-02-28 DIAGNOSIS — I2510 Atherosclerotic heart disease of native coronary artery without angina pectoris: Secondary | ICD-10-CM | POA: Diagnosis not present

## 2022-02-28 DIAGNOSIS — J439 Emphysema, unspecified: Secondary | ICD-10-CM | POA: Insufficient documentation

## 2022-02-28 DIAGNOSIS — Z122 Encounter for screening for malignant neoplasm of respiratory organs: Secondary | ICD-10-CM | POA: Insufficient documentation

## 2022-02-28 DIAGNOSIS — I700 Atherosclerosis of aorta: Secondary | ICD-10-CM | POA: Diagnosis not present

## 2022-02-28 DIAGNOSIS — F17210 Nicotine dependence, cigarettes, uncomplicated: Secondary | ICD-10-CM | POA: Diagnosis present

## 2022-02-28 DIAGNOSIS — K760 Fatty (change of) liver, not elsewhere classified: Secondary | ICD-10-CM | POA: Diagnosis not present

## 2022-08-06 ENCOUNTER — Other Ambulatory Visit: Payer: Self-pay

## 2022-08-06 DIAGNOSIS — M79604 Pain in right leg: Secondary | ICD-10-CM

## 2022-08-06 DIAGNOSIS — R0989 Other specified symptoms and signs involving the circulatory and respiratory systems: Secondary | ICD-10-CM

## 2022-08-06 DIAGNOSIS — I739 Peripheral vascular disease, unspecified: Secondary | ICD-10-CM

## 2022-08-07 ENCOUNTER — Other Ambulatory Visit: Payer: Self-pay | Admitting: Gerontology

## 2022-08-07 ENCOUNTER — Ambulatory Visit
Admission: RE | Admit: 2022-08-07 | Discharge: 2022-08-07 | Disposition: A | Discharge status: Home / Self Care | Payer: Medicare HMO | Source: Ambulatory Visit | Attending: Gerontology | Admitting: Gerontology

## 2022-08-07 DIAGNOSIS — I739 Peripheral vascular disease, unspecified: Secondary | ICD-10-CM | POA: Insufficient documentation

## 2022-08-07 DIAGNOSIS — R0989 Other specified symptoms and signs involving the circulatory and respiratory systems: Secondary | ICD-10-CM

## 2022-08-07 DIAGNOSIS — M79604 Pain in right leg: Secondary | ICD-10-CM

## 2022-08-31 DIAGNOSIS — E119 Type 2 diabetes mellitus without complications: Secondary | ICD-10-CM | POA: Insufficient documentation

## 2022-08-31 DIAGNOSIS — I70219 Atherosclerosis of native arteries of extremities with intermittent claudication, unspecified extremity: Secondary | ICD-10-CM | POA: Insufficient documentation

## 2022-08-31 NOTE — Progress Notes (Signed)
 "                MRN : [id number]  William Ball is a 77 y.o. (1946/01/09) male who presents with chief complaint of check circulation.  History of Present Illness:    The patient is seen for evaluation of painful lower extremities and diminished pulses. Patient notes the pain is always associated with activity and is very consistent day today. Typically, the pain occurs at less than one block, progress is as activity continues to the point that the patient must stop walking. Resting including standing still for several minutes allows the patient to walk a similar distance before being forced to stop again. Uneven terrain and inclines shorten the distance. The pain has been progressive over the past several years. The patient denies any abrupt changes in claudication symptoms.  The patient states the inability to walk is causing problems with daily activities.  The patient is also describing classic rest pain symptoms and is asking for pain medication for relief since he is having such great difficulty sleeping.    No open wounds or sores at this time. No prior interventions or surgeries.  No history of back problems or DJD of the lumbar sacral spine.   The patient's blood pressure has been stable and relatively well controlled. The patient denies amaurosis fugax or recent TIA symptoms. There are no recent neurological changes noted. The patient denies history of DVT, PE or superficial thrombophlebitis. The patient denies recent episodes of angina or shortness of breath.   ABIs done 11/01/2019 Rt=0.82 and Lt=1.02   No outpatient medications have been marked as taking for the 09/01/22 encounter (Appointment) with Jama, Cordella MATSU, MD.    Past Medical History:  Diagnosis Date   Diabetes mellitus without complication (HCC)    borderline   Hepatomegaly    Hyperlipidemia    Hypertension    Substance abuse (HCC)     Past Surgical History:  Procedure Laterality Date   ANGIOPLASTY      APPENDECTOMY     BONE MARROW TRANSPLANT Left    ankle.  patient unsure of this but thinks this is what happened   CARDIAC SURGERY     CATARACT EXTRACTION W/PHACO Left 11/13/2015   Procedure: CATARACT EXTRACTION PHACO AND INTRAOCULAR LENS PLACEMENT (IOC);  Surgeon: Elsie Carmine, MD;  Location: [facility] ORS;  Service: Ophthalmology;  Laterality: Left;  US  1.06 AP% 23.9 CDE 15.81 Fluid pack lot # [id number]   CATARACT EXTRACTION W/PHACO Right 11/27/2015   Procedure: CATARACT EXTRACTION PHACO AND INTRAOCULAR LENS PLACEMENT (IOC);  Surgeon: Elsie Carmine, MD;  Location: [facility] ORS;  Service: Ophthalmology;  Laterality: Right;  US  1.06 AP% 23.8 CDE 15.80 Fluid pack lot # [id number]   COLONOSCOPY  2012   polyps, diverticulosis , 65yr repeat   COLONOSCOPY WITH PROPOFOL  N/A 07/17/2016   Procedure: COLONOSCOPY WITH PROPOFOL ;  Surgeon: Ruel Kung, MD;  Location: [facility] ENDOSCOPY;  Service: Endoscopy;  Laterality: N/A;   FRACTURE SURGERY Left 1982   foot, leg.unsure if metal in these parts.possibly replaced ankle   PERIPHERAL VASCULAR CATHETERIZATION Left 12/27/2015   Procedure: Lower Extremity Angiography;  Surgeon: Selinda GORMAN Gu, MD;  Location: [facility] INVASIVE CV LAB;  Service: Cardiovascular;  Laterality: Left;    Social History Social History   Tobacco Use   Smoking status: Every Day    Packs/day: 1.00    Years: 55.00    Additional pack years: 0.00    Total pack years: 55.00    Types: Cigarettes  Smokeless tobacco: Never  Vaping Use   Vaping Use: Never used  Substance Use Topics   Alcohol use: Yes    Alcohol/week: 28.0 standard drinks of alcohol    Types: 28 Cans of beer per week    Comment: 4-6 daily beer   Drug use: No    Family History Family History  Problem Relation Age of Onset   Cancer Mother    Heart disease Mother    Hyperlipidemia Mother    Heart disease Father    Hyperlipidemia Father     No Known Allergies   REVIEW OF SYSTEMS (Negative unless  checked)  Constitutional: [] Weight loss  [] Fever  [] Chills Cardiac: [] Chest pain   [] Chest pressure   [] Palpitations   [] Shortness of breath when laying flat   [] Shortness of breath with exertion. Vascular:  [x] Pain in legs with walking   [] Pain in legs at rest  [] History of DVT   [] Phlebitis   [] Swelling in legs   [] Varicose veins   [] Non-healing ulcers Pulmonary:   [] Uses home oxygen   [] Productive cough   [] Hemoptysis   [] Wheeze  [] COPD   [] Asthma Neurologic:  [] Dizziness   [] Seizures   [] History of stroke   [] History of TIA  [] Aphasia   [] Vissual changes   [] Weakness or numbness in arm   [] Weakness or numbness in leg Musculoskeletal:   [] Joint swelling   [] Joint pain   [] Low back pain Hematologic:  [] Easy bruising  [] Easy bleeding   [] Hypercoagulable state   [] Anemic Gastrointestinal:  [] Diarrhea   [] Vomiting  [] Gastroesophageal reflux/heartburn   [] Difficulty swallowing. Genitourinary:  [] Chronic kidney disease   [] Difficult urination  [] Frequent urination   [] Blood in urine Skin:  [] Rashes   [] Ulcers  Psychological:  [] History of anxiety   []  History of major depression.  Physical Examination  There were no vitals filed for this visit. There is no height or weight on file to calculate BMI. Gen: WD/WN, NAD Head: [place]/AT, No temporalis wasting.  Ear/Nose/Throat: Hearing grossly intact, nares w/o erythema or drainage Eyes: PER, EOMI, sclera nonicteric.  Neck: Supple, no masses.  No bruit or JVD.  Pulmonary:  Good air movement, no audible wheezing, no use of accessory muscles.  Cardiac: RRR, normal S1, S2, no Murmurs. Vascular:  mild trophic changes, no open wounds Vessel Right Left  Radial Palpable Palpable  PT Not Palpable Trace Palpable  DP Not Palpable Trace  Palpable  Gastrointestinal: soft, non-distended. No guarding/no peritoneal signs.  Musculoskeletal: M/S 5/5 throughout.  No visible deformity.  Neurologic: CN 2-12 intact. Pain and light touch intact in extremities.   Symmetrical.  Speech is fluent. Motor exam as listed above. Psychiatric: Judgment intact, Mood & affect appropriate for pt's clinical situation. Dermatologic: No rashes or ulcers noted.  No changes consistent with cellulitis.   CBC Lab Results  Component Value Date   WBC 6.7 06/13/2019   HGB 14.0 06/13/2019   HCT 41.5 06/13/2019   MCV 92 06/13/2019   PLT 258 06/13/2019    BMET    Component Value Date/Time   NA 134 06/18/2020 1556   K 4.1 06/18/2020 1556   CL 99 06/18/2020 1556   CO2 18 (L) 06/18/2020 1556   GLUCOSE 303 (H) 06/18/2020 1556   GLUCOSE 126 (H) 01/27/2017 1016   BUN 21 06/18/2020 1556   CREATININE 1.23 06/18/2020 1556   CALCIUM  9.4 06/18/2020 1556   GFRNONAA 57 (L) 06/18/2020 1556   GFRAA 66 06/18/2020 1556   CrCl cannot be calculated (Patient's most  recent lab result is older than the maximum 21 days allowed.).  COAG No results found for: INR, PROTIME  Radiology US  ARTERIAL ABI (SCREENING LOWER EXTREMITY)  Result Date: 08/08/2022 CLINICAL DATA:  Right leg pain, decreased pedal pulses EXAM: NONINVASIVE PHYSIOLOGIC VASCULAR STUDY OF BILATERAL LOWER EXTREMITIES TECHNIQUE: Evaluation of both lower extremities were performed at rest, including calculation of ankle-brachial indices with single level Doppler, pressure and pulse volume recording. COMPARISON:  None Available. FINDINGS: Right ABI:  0.37 Left ABI: 0.75 Right Lower Extremity:  Abnormal monophasic arterial waveforms. Left Lower Extremity:  Normal arterial waveforms at the ankle. < 0.5 Severe PAD IMPRESSION: 1. Severely abnormal resting right ankle-brachial index of 0.37 consistent with severe peripheral arterial disease. 2. Moderately abnormal resting left ankle-brachial index of 0.75 consistent with moderate peripheral arterial disease. Signed, Wilkie LOIS Lent, MD, RPVI [facility] Electronically Signed   By: Wilkie Lent M.D.   On:  08/08/2022 05:37   US  Venous Img Lower Unilateral Right (DVT)  Result Date: 08/07/2022 CLINICAL DATA:  Right leg pain EXAM: Right LOWER EXTREMITY VENOUS DOPPLER ULTRASOUND TECHNIQUE: Gray-scale sonography with compression, as well as color and duplex ultrasound, were performed to evaluate the deep venous system(s) from the level of the common femoral vein through the popliteal and proximal calf veins. COMPARISON:  None Available. FINDINGS: VENOUS Normal compressibility of the common femoral, superficial femoral, and popliteal veins, as well as the visualized calf veins. Visualized portions of profunda femoral vein and great saphenous vein unremarkable. No filling defects to suggest DVT on grayscale or color Doppler imaging. Doppler waveforms show normal direction of venous flow, normal respiratory plasticity and response to augmentation. Limited views of the contralateral common femoral vein are unremarkable. OTHER None. Limitations: none IMPRESSION: No evidence of right lower extremity DVT. Electronically Signed   By: Rea Marc M.D.   On: 08/07/2022 10:08     Assessment/Plan 1. Atherosclerosis of native artery of right lower extremity with rest pain (HCC) Recommend:  The patient has evidence of severe atherosclerotic changes of both lower extremities with rest pain that is associated with preulcerative changes and impending tissue loss of the right foot.  This represents a limb threatening ischemia and places the patient at the risk for right limb loss.  Patient should undergo angiography of the right lower extremity with the hope for intervention for limb salvage.  The risks and benefits as well as the alternative therapies was discussed in detail with the patient.  All questions were answered.  Patient agrees to proceed with right lower extremity angiography.  The patient will follow up with me in the office after the procedure.   2. Hypertension associated with diabetes (HCC) Continue  antihypertensive medications as already ordered, these medications have been reviewed and there are no changes at this time.  3. Pulmonary emphysema, unspecified emphysema type (HCC) Continue pulmonary medications and aerosols as already ordered, these medications have been reviewed and there are no changes at this time.   4. Hyperlipidemia, unspecified hyperlipidemia type Continue statin as ordered and reviewed, no changes at this time  5. Stage 3 chronic kidney disease, unspecified whether stage 3a or 3b CKD (HCC) The patient has advanced renal disease.  However, at the present time the patient is not yet on dialysis.  Given that he needs angiography we will hydrate.  Avoid nephrotoxic medications as much as possible and avoid dehydration.  Further plans per nephrology  6. Type 2 diabetes mellitus with diabetic peripheral  angiopathy without gangrene, unspecified whether long term insulin  use (HCC) Continue hypoglycemic medications as already ordered, these medications have been reviewed and there are no changes at this time.  Hgb A1C to be monitored as already arranged by primary service    Cordella Shawl, MD  08/31/2022 11:52 AM   "

## 2022-09-01 ENCOUNTER — Ambulatory Visit (INDEPENDENT_AMBULATORY_CARE_PROVIDER_SITE_OTHER): Payer: Medicare HMO | Admitting: Vascular Surgery

## 2022-09-01 VITALS — BP 150/64 | HR 73 | Resp 18 | Ht 69.0 in | Wt 188.0 lb

## 2022-09-01 DIAGNOSIS — I70221 Atherosclerosis of native arteries of extremities with rest pain, right leg: Secondary | ICD-10-CM

## 2022-09-01 DIAGNOSIS — E785 Hyperlipidemia, unspecified: Secondary | ICD-10-CM

## 2022-09-01 DIAGNOSIS — N183 Chronic kidney disease, stage 3 unspecified: Secondary | ICD-10-CM | POA: Diagnosis not present

## 2022-09-01 DIAGNOSIS — E1151 Type 2 diabetes mellitus with diabetic peripheral angiopathy without gangrene: Secondary | ICD-10-CM | POA: Diagnosis not present

## 2022-09-01 DIAGNOSIS — I152 Hypertension secondary to endocrine disorders: Secondary | ICD-10-CM | POA: Diagnosis not present

## 2022-09-01 DIAGNOSIS — J439 Emphysema, unspecified: Secondary | ICD-10-CM | POA: Diagnosis not present

## 2022-09-01 DIAGNOSIS — E1159 Type 2 diabetes mellitus with other circulatory complications: Secondary | ICD-10-CM | POA: Diagnosis not present

## 2022-09-01 DIAGNOSIS — I70213 Atherosclerosis of native arteries of extremities with intermittent claudication, bilateral legs: Secondary | ICD-10-CM

## 2022-09-02 ENCOUNTER — Telehealth (INDEPENDENT_AMBULATORY_CARE_PROVIDER_SITE_OTHER): Payer: Self-pay | Admitting: Vascular Surgery

## 2022-09-02 ENCOUNTER — Encounter (INDEPENDENT_AMBULATORY_CARE_PROVIDER_SITE_OTHER): Payer: Self-pay | Admitting: Vascular Surgery

## 2022-09-02 DIAGNOSIS — I70229 Atherosclerosis of native arteries of extremities with rest pain, unspecified extremity: Secondary | ICD-10-CM | POA: Insufficient documentation

## 2022-09-02 MED ORDER — HYDROCODONE-ACETAMINOPHEN 5-325 MG PO TABS: 1.0000 | ORAL_TABLET | Freq: Two times a day (BID) | ORAL | 0 refills | Status: DC | PRN

## 2022-09-02 NOTE — Telephone Encounter (Signed)
Message has been sent to Dr Schnier 

## 2022-09-02 NOTE — Telephone Encounter (Signed)
Pt's daughter called in stating that Dr. Gilda Crease saw pt yesterday and was told that Dr. Gilda Crease would send in pain medicine to the pharmacy. States that they have looked at every pharmacy and can't find the RX. Pt would like the RX sent to [facility] on [address] in [place]. Please advise patient

## 2022-09-02 NOTE — Telephone Encounter (Signed)
Patient's daughter called stating Dr. Gilda Crease was supposed to call in some pain medication for patient to the [facility] on [address] and they states no prescription has been called in.  Please advise.

## 2022-09-02 NOTE — Telephone Encounter (Signed)
Patient notified that prescription has been sent to pharmacy.

## 2022-09-02 NOTE — Telephone Encounter (Signed)
Patient was notified that prescription was sent to [facility] as requested from previous message

## 2022-09-03 ENCOUNTER — Inpatient Hospital Stay
Admission: EM | Admit: 2022-09-03 | Discharge: 2022-09-15 | DRG: 253 | Disposition: A | Discharge status: Home Health | Payer: Medicare HMO | Attending: Hospitalist | Admitting: Hospitalist

## 2022-09-03 ENCOUNTER — Other Ambulatory Visit: Payer: Self-pay

## 2022-09-03 ENCOUNTER — Emergency Department: Payer: Medicare HMO

## 2022-09-03 ENCOUNTER — Encounter: Payer: Self-pay | Admitting: Emergency Medicine

## 2022-09-03 ENCOUNTER — Telehealth (INDEPENDENT_AMBULATORY_CARE_PROVIDER_SITE_OTHER): Payer: Self-pay | Admitting: Vascular Surgery

## 2022-09-03 DIAGNOSIS — F17210 Nicotine dependence, cigarettes, uncomplicated: Secondary | ICD-10-CM | POA: Diagnosis present

## 2022-09-03 DIAGNOSIS — Z794 Long term (current) use of insulin: Secondary | ICD-10-CM | POA: Diagnosis not present

## 2022-09-03 DIAGNOSIS — K5903 Drug induced constipation: Secondary | ICD-10-CM | POA: Diagnosis present

## 2022-09-03 DIAGNOSIS — Z79899 Other long term (current) drug therapy: Secondary | ICD-10-CM

## 2022-09-03 DIAGNOSIS — I70212 Atherosclerosis of native arteries of extremities with intermittent claudication, left leg: Secondary | ICD-10-CM | POA: Diagnosis not present

## 2022-09-03 DIAGNOSIS — I152 Hypertension secondary to endocrine disorders: Secondary | ICD-10-CM | POA: Diagnosis present

## 2022-09-03 DIAGNOSIS — I70211 Atherosclerosis of native arteries of extremities with intermittent claudication, right leg: Secondary | ICD-10-CM | POA: Diagnosis not present

## 2022-09-03 DIAGNOSIS — Z83438 Family history of other disorder of lipoprotein metabolism and other lipidemia: Secondary | ICD-10-CM

## 2022-09-03 DIAGNOSIS — Z6825 Body mass index (BMI) 25.0-25.9, adult: Secondary | ICD-10-CM | POA: Diagnosis not present

## 2022-09-03 DIAGNOSIS — R2241 Localized swelling, mass and lump, right lower limb: Secondary | ICD-10-CM

## 2022-09-03 DIAGNOSIS — K219 Gastro-esophageal reflux disease without esophagitis: Secondary | ICD-10-CM | POA: Diagnosis present

## 2022-09-03 DIAGNOSIS — E871 Hypo-osmolality and hyponatremia: Secondary | ICD-10-CM | POA: Diagnosis not present

## 2022-09-03 DIAGNOSIS — I739 Peripheral vascular disease, unspecified: Secondary | ICD-10-CM | POA: Diagnosis present

## 2022-09-03 DIAGNOSIS — Z7982 Long term (current) use of aspirin: Secondary | ICD-10-CM | POA: Diagnosis not present

## 2022-09-03 DIAGNOSIS — E1151 Type 2 diabetes mellitus with diabetic peripheral angiopathy without gangrene: Secondary | ICD-10-CM | POA: Diagnosis present

## 2022-09-03 DIAGNOSIS — M79604 Pain in right leg: Secondary | ICD-10-CM

## 2022-09-03 DIAGNOSIS — T82856A Stenosis of peripheral vascular stent, initial encounter: Secondary | ICD-10-CM | POA: Diagnosis present

## 2022-09-03 DIAGNOSIS — I700 Atherosclerosis of aorta: Secondary | ICD-10-CM | POA: Diagnosis present

## 2022-09-03 DIAGNOSIS — N400 Enlarged prostate without lower urinary tract symptoms: Secondary | ICD-10-CM | POA: Diagnosis present

## 2022-09-03 DIAGNOSIS — Z888 Allergy status to other drugs, medicaments and biological substances status: Secondary | ICD-10-CM

## 2022-09-03 DIAGNOSIS — F1090 Alcohol use, unspecified, uncomplicated: Secondary | ICD-10-CM | POA: Diagnosis present

## 2022-09-03 DIAGNOSIS — I70223 Atherosclerosis of native arteries of extremities with rest pain, bilateral legs: Secondary | ICD-10-CM | POA: Diagnosis present

## 2022-09-03 DIAGNOSIS — Y839 Surgical procedure, unspecified as the cause of abnormal reaction of the patient, or of later complication, without mention of misadventure at the time of the procedure: Secondary | ICD-10-CM | POA: Diagnosis present

## 2022-09-03 DIAGNOSIS — I708 Atherosclerosis of other arteries: Secondary | ICD-10-CM | POA: Diagnosis present

## 2022-09-03 DIAGNOSIS — R936 Abnormal findings on diagnostic imaging of limbs: Secondary | ICD-10-CM

## 2022-09-03 DIAGNOSIS — I998 Other disorder of circulatory system: Secondary | ICD-10-CM | POA: Diagnosis present

## 2022-09-03 DIAGNOSIS — Z9889 Other specified postprocedural states: Secondary | ICD-10-CM | POA: Diagnosis not present

## 2022-09-03 DIAGNOSIS — Z8249 Family history of ischemic heart disease and other diseases of the circulatory system: Secondary | ICD-10-CM | POA: Diagnosis not present

## 2022-09-03 DIAGNOSIS — Z716 Tobacco abuse counseling: Secondary | ICD-10-CM

## 2022-09-03 DIAGNOSIS — E1169 Type 2 diabetes mellitus with other specified complication: Secondary | ICD-10-CM | POA: Diagnosis present

## 2022-09-03 DIAGNOSIS — E785 Hyperlipidemia, unspecified: Secondary | ICD-10-CM | POA: Diagnosis present

## 2022-09-03 DIAGNOSIS — E663 Overweight: Secondary | ICD-10-CM | POA: Diagnosis present

## 2022-09-03 DIAGNOSIS — Z7984 Long term (current) use of oral hypoglycemic drugs: Secondary | ICD-10-CM | POA: Diagnosis not present

## 2022-09-03 DIAGNOSIS — Z7902 Long term (current) use of antithrombotics/antiplatelets: Secondary | ICD-10-CM

## 2022-09-03 DIAGNOSIS — E1159 Type 2 diabetes mellitus with other circulatory complications: Secondary | ICD-10-CM | POA: Diagnosis not present

## 2022-09-03 DIAGNOSIS — I70221 Atherosclerosis of native arteries of extremities with rest pain, right leg: Secondary | ICD-10-CM | POA: Diagnosis not present

## 2022-09-03 DIAGNOSIS — Z789 Other specified health status: Secondary | ICD-10-CM | POA: Insufficient documentation

## 2022-09-03 LAB — CBC WITH DIFFERENTIAL/PLATELET
Abs Immature Granulocytes: 0.05 K/uL (ref 0.00–0.07)
Basophils Absolute: 0.1 K/uL (ref 0.0–0.1)
Basophils Relative: 1 %
Eosinophils Absolute: 0.8 K/uL — ABNORMAL HIGH (ref 0.0–0.5)
Eosinophils Relative: 8 %
HCT: 47.1 % (ref 39.0–52.0)
Hemoglobin: 15.8 g/dL (ref 13.0–17.0)
Immature Granulocytes: 1 %
Lymphocytes Relative: 39 %
Lymphs Abs: 3.7 K/uL (ref 0.7–4.0)
MCH: 30.3 pg (ref 26.0–34.0)
MCHC: 33.5 g/dL (ref 30.0–36.0)
MCV: 90.2 fL (ref 80.0–100.0)
Monocytes Absolute: 0.8 K/uL (ref 0.1–1.0)
Monocytes Relative: 8 %
Neutro Abs: 4.1 K/uL (ref 1.7–7.7)
Neutrophils Relative %: 43 %
Platelets: 315 K/uL (ref 150–400)
RBC: 5.22 MIL/uL (ref 4.22–5.81)
RDW: 12.6 % (ref 11.5–15.5)
WBC: 9.5 K/uL (ref 4.0–10.5)
nRBC: 0 % (ref 0.0–0.2)

## 2022-09-03 LAB — COMPREHENSIVE METABOLIC PANEL WITH GFR
ALT: 35 U/L (ref 0–44)
AST: 28 U/L (ref 15–41)
Albumin: 4.4 g/dL (ref 3.5–5.0)
Alkaline Phosphatase: 102 U/L (ref 38–126)
Anion gap: 11 (ref 5–15)
BUN: 39 mg/dL — ABNORMAL HIGH (ref 8–23)
CO2: 23 mmol/L (ref 22–32)
Calcium: 9.8 mg/dL (ref 8.9–10.3)
Chloride: 102 mmol/L (ref 98–111)
Creatinine, Ser: 1.49 mg/dL — ABNORMAL HIGH (ref 0.61–1.24)
GFR, Estimated: 48 mL/min — ABNORMAL LOW
Glucose, Bld: 72 mg/dL (ref 70–99)
Potassium: 4 mmol/L (ref 3.5–5.1)
Sodium: 136 mmol/L (ref 135–145)
Total Bilirubin: 0.5 mg/dL (ref 0.3–1.2)
Total Protein: 8.5 g/dL — ABNORMAL HIGH (ref 6.5–8.1)

## 2022-09-03 LAB — PROTIME-INR
INR: 1 (ref 0.8–1.2)
Prothrombin Time: 13.3 s (ref 11.4–15.2)

## 2022-09-03 LAB — APTT: aPTT: 30 s (ref 24–36)

## 2022-09-03 MED ORDER — ONDANSETRON HCL 4 MG/2ML IJ SOLN: 4.0000 mg | Freq: Four times a day (QID) | INTRAMUSCULAR | Status: AC | PRN

## 2022-09-03 MED ORDER — SPIRONOLACTONE 25 MG PO TABS
25.0000 mg | ORAL_TABLET | Freq: Every day | ORAL | Status: DC
  Administered 2022-09-04: 25 mg via ORAL
  Filled 2022-09-03: qty 1

## 2022-09-03 MED ORDER — IRBESARTAN 150 MG PO TABS
300.0000 mg | ORAL_TABLET | Freq: Every day | ORAL | Status: DC
  Administered 2022-09-04 – 2022-09-15 (×11): 300 mg via ORAL
  Filled 2022-09-03 (×11): qty 2

## 2022-09-03 MED ORDER — CHLORTHALIDONE 25 MG PO TABS
50.0000 mg | ORAL_TABLET | Freq: Every day | ORAL | Status: DC
  Administered 2022-09-04: 50 mg via ORAL
  Filled 2022-09-03: qty 2

## 2022-09-03 MED ORDER — TERAZOSIN HCL 2 MG PO CAPS
2.0000 mg | ORAL_CAPSULE | Freq: Every day | ORAL | Status: DC
  Administered 2022-09-03 – 2022-09-14 (×12): 2 mg via ORAL
  Filled 2022-09-03 (×12): qty 1

## 2022-09-03 MED ORDER — ONDANSETRON HCL 4 MG PO TABS: 4.0000 mg | ORAL_TABLET | Freq: Four times a day (QID) | ORAL | Status: AC | PRN

## 2022-09-03 MED ORDER — FENTANYL CITRATE PF 50 MCG/ML IJ SOSY
25.0000 ug | PREFILLED_SYRINGE | INTRAMUSCULAR | Status: AC | PRN
  Administered 2022-09-04: 25 ug via INTRAVENOUS
  Filled 2022-09-03: qty 1

## 2022-09-03 MED ORDER — ACETAMINOPHEN 325 MG PO TABS
650.0000 mg | ORAL_TABLET | Freq: Four times a day (QID) | ORAL | Status: AC | PRN
  Administered 2022-09-04: 650 mg via ORAL
  Filled 2022-09-03: qty 2

## 2022-09-03 MED ORDER — CARVEDILOL 3.125 MG PO TABS
3.1250 mg | ORAL_TABLET | Freq: Two times a day (BID) | ORAL | Status: DC
  Administered 2022-09-03 – 2022-09-15 (×20): 3.125 mg via ORAL
  Filled 2022-09-03 (×25): qty 1

## 2022-09-03 MED ORDER — NICOTINE 21 MG/24HR TD PT24: 21.0000 mg | MEDICATED_PATCH | Freq: Every day | TRANSDERMAL | Status: DC | PRN

## 2022-09-03 MED ORDER — MORPHINE SULFATE (PF) 4 MG/ML IV SOLN
4.0000 mg | Freq: Once | INTRAVENOUS | Status: AC
  Administered 2022-09-03: 4 mg via INTRAVENOUS
  Filled 2022-09-03: qty 1

## 2022-09-03 MED ORDER — MORPHINE SULFATE (PF) 4 MG/ML IV SOLN: 4.0000 mg | INTRAVENOUS | Status: DC | PRN

## 2022-09-03 MED ORDER — ASPIRIN 81 MG PO TBEC
81.0000 mg | DELAYED_RELEASE_TABLET | Freq: Every day | ORAL | Status: DC
  Administered 2022-09-04 – 2022-09-15 (×11): 81 mg via ORAL
  Filled 2022-09-03 (×12): qty 1

## 2022-09-03 MED ORDER — LORAZEPAM 2 MG/ML IJ SOLN: 2.0000 mg | INTRAMUSCULAR | Status: DC | PRN

## 2022-09-03 MED ORDER — MORPHINE SULFATE (PF) 4 MG/ML IV SOLN
4.0000 mg | INTRAVENOUS | Status: AC | PRN
  Administered 2022-09-04: 4 mg via INTRAVENOUS
  Filled 2022-09-03 (×2): qty 1

## 2022-09-03 MED ORDER — ATORVASTATIN CALCIUM 20 MG PO TABS
20.0000 mg | ORAL_TABLET | Freq: Every day | ORAL | Status: DC
  Administered 2022-09-04 – 2022-09-15 (×11): 20 mg via ORAL
  Filled 2022-09-03 (×11): qty 1

## 2022-09-03 MED ORDER — HEPARIN (PORCINE) 25000 UT/250ML-% IV SOLN
1350.0000 [IU]/h | INTRAVENOUS | Status: DC
  Administered 2022-09-03 – 2022-09-09 (×8): 1350 [IU]/h via INTRAVENOUS
  Filled 2022-09-03 (×8): qty 250

## 2022-09-03 MED ORDER — SENNOSIDES-DOCUSATE SODIUM 8.6-50 MG PO TABS: 1.0000 | ORAL_TABLET | Freq: Every evening | ORAL | Status: DC | PRN

## 2022-09-03 MED ORDER — HEPARIN BOLUS VIA INFUSION
5100.0000 [IU] | Freq: Once | INTRAVENOUS | Status: AC
  Administered 2022-09-03: 5100 [IU] via INTRAVENOUS
  Filled 2022-09-03: qty 5100

## 2022-09-03 MED ORDER — ACETAMINOPHEN 650 MG RE SUPP: 650.0000 mg | Freq: Four times a day (QID) | RECTAL | Status: AC | PRN

## 2022-09-03 MED ORDER — ONDANSETRON HCL 4 MG/2ML IJ SOLN
4.0000 mg | Freq: Once | INTRAMUSCULAR | Status: AC
  Administered 2022-09-03: 4 mg via INTRAVENOUS
  Filled 2022-09-03: qty 2

## 2022-09-03 NOTE — Consult Note (Signed)
Hospital Consult    Reason for Consult:  Right lower extremity pain Requesting Physician:  Dr Shannon Mumma MD MRN #:  [id number]  History of Present Illness: This is a 77 y.o. male who presents to [facility]'s emergency department today with increasing pain to his right lower extremity with swelling.  Patient endorses over the last 2 weeks he has had increased pain with exercise and at rest.  Endorses right foot swelling started 5 days ago and has become warm to touch.  Denies any chest pain or shortness of breath today.  Patient denies any nausea vomiting or diarrhea.  No other complaints today vitals all remained stable.  Patient also endorses last month he had bilateral lower extremity ultrasounds which showed decreased blood flow in his right leg.  Past Medical History:  Diagnosis Date   Diabetes mellitus without complication (HCC)    borderline   Hepatomegaly    Hyperlipidemia    Hypertension    Substance abuse (HCC)     Past Surgical History:  Procedure Laterality Date   ANGIOPLASTY     APPENDECTOMY     BONE MARROW TRANSPLANT Left    ankle.  patient unsure of this but thinks this is what happened   CARDIAC SURGERY     CATARACT EXTRACTION W/PHACO Left 11/13/2015   Procedure: CATARACT EXTRACTION PHACO AND INTRAOCULAR LENS PLACEMENT (IOC);  Surgeon: William Porfilio, MD;  Location: [facility] ORS;  Service: Ophthalmology;  Laterality: Left;  US 1.06 AP% 23.9 CDE 15.81 Fluid pack lot # [id number]   CATARACT EXTRACTION W/PHACO Right 11/27/2015   Procedure: CATARACT EXTRACTION PHACO AND INTRAOCULAR LENS PLACEMENT (IOC);  Surgeon: William Porfilio, MD;  Location: [facility] ORS;  Service: Ophthalmology;  Laterality: Right;  US 1.06 AP% 23.8 CDE 15.80 Fluid pack lot # [id number]   COLONOSCOPY  2012   polyps, diverticulosis , 5yr repeat   COLONOSCOPY WITH PROPOFOL N/A 07/17/2016   Procedure: COLONOSCOPY WITH PROPOFOL;  Surgeon: Kiran Anna, MD;  Location: [facility] END[facility];  Service: Endoscopy;   Laterality: N/A;   FRACTURE SURGERY Left 1982   foot, leg.unsure if metal in these parts.possibly replaced ankle   PERIPHERAL VASCULAR CATHETERIZATION Left 12/27/2015   Procedure: Lower Extremity Angiography;  Surgeon: Jason S Dew, MD;  Location: [facility];  Service: Cardiovascular;  Laterality: Left;    Allergies  Allergen Reactions   Amlodipine Itching    Prior to Admission medications   Medication Sig Start Date End Date Taking? Authorizing Provider  albuterol (VENTOLIN HFA) 108 (90 Base) MCG/ACT inhaler Inhale into the lungs. 05/23/21   [provider]  Aspirin 81 MG CAPS Take by mouth.    [provider]  atorvastatin (LIPITOR) 20 MG tablet TAKE 1 TABLET BY MOUTH EVERY DAY 04/15/20   Johnson, Megan P, DO  benazepril (LOTENSIN) 40 MG tablet Take 1 tablet (40 mg total) by mouth daily. Patient not taking: Reported on 09/01/2022 07/05/20   Agbor-Etang, Brian, MD  carvedilol (COREG) 3.125 MG tablet Take 3.125 mg by mouth 2 (two) times daily. 04/09/22   [provider]  chlorthalidone (HYGROTON) 50 MG tablet Take 1 tablet (50 mg total) by mouth daily. MUST BE SEEN IN CLINIC FOR FURTHER REFILLS ON MEDICATION 05/27/21   Agbor-Etang, Brian, MD  cilostazol (PLETAL) 50 MG tablet Take 50 mg by mouth 2 (two) times daily. 08/13/22   [provider]  glipiZIDE (GLUCOTROL) 10 MG tablet TAKE 1 TABLET (10 MG TOTAL) BY MOUTH 2 (TWO) TIMES DAILY BEFORE A MEAL. 04/09/20     Johnson, Megan P, DO  HYDROcodone-acetaminophen (NORCO) 5-325 MG tablet Take 1 tablet by mouth every 12 (twelve) hours as needed for moderate pain or severe pain. 09/02/22   Schnier, Gregory G, MD  Insulin Pen Needle (NOVOFINE PEN NEEDLE) 32G X 6 MM MISC Use once daily with Lantus pen 10/02/20   [provider]  JARDIANCE 10 MG TABS tablet Take 10 mg by mouth daily.    [provider]  Omega-3 Fatty Acids (FISH OIL PO) Take by mouth daily.    [provider]  spironolactone  (ALDACTONE) 25 MG tablet Take 1 tablet by mouth daily. 06/30/22   [provider]  terazosin (HYTRIN) 2 MG capsule Take 2 mg by mouth at bedtime.    [provider]  TRESIBA FLEXTOUCH 100 UNIT/ML FlexTouch Pen Inject into the skin. 08/06/22   [provider]  valsartan (DIOVAN) 320 MG tablet 160 mg. 04/15/22 04/15/23  [provider]    Social History   Socioeconomic History   Marital status: Married    Spouse name: Not on file   Number of children: Not on file   Years of education: Not on file   Highest education level: 9th grade  Occupational History   Occupation: working full time   Tobacco Use   Smoking status: Every Day    Packs/day: 1.00    Years: 55.00    Additional pack years: 0.00    Total pack years: 55.00    Types: Cigarettes   Smokeless tobacco: Never  Vaping Use   Vaping Use: Never used  Substance and Sexual Activity   Alcohol use: Yes    Alcohol/week: 28.0 standard drinks of alcohol    Types: 28 Cans of beer per week    Comment: 4-6 daily beer   Drug use: No   Sexual activity: Not on file  Other Topics Concern   Not on file  Social History Narrative   Retired   Social Determinants of Health   Financial Resource Strain: Low Risk  (08/06/2017)   Overall Financial Resource Strain (CARDIA)    Difficulty of Paying Living Expenses: Not hard at all  Food Insecurity: No Food Insecurity (08/06/2017)   Hunger Vital Sign    Worried About Running Out of Food in the Last Year: Never true    Ran Out of Food in the Last Year: Never true  Transportation Needs: No Transportation Needs (08/06/2017)   PRAPARE - Transportation    Lack of Transportation (Medical): No    Lack of Transportation (Non-Medical): No  Physical Activity: Inactive (08/06/2017)   Exercise Vital Sign    Days of Exercise per Week: 0 days    Minutes of Exercise per Session: 0 min  Stress: No Stress Concern Present (08/06/2017)   [employer] -  Occupational Stress Questionnaire    Feeling of Stress : Not at all  Social Connections: Moderately Isolated (08/06/2017)   Social Connection and Isolation Panel [NHANES]    Frequency of Communication with Friends and Family: Never    Frequency of Social Gatherings with Friends and Family: Once a week    Attends Religious Services: Never    Active Member of Clubs or Organizations: No    Attends Club or Organization Meetings: Never    Marital Status: Married  Intimate Partner Violence: Not At Risk (08/06/2017)   Humiliation, Afraid, Rape, and Kick questionnaire    Fear of Current or Ex-Partner: No    Emotionally Abused: No    Physically   Abused: No    Sexually Abused: No     Family History  Problem Relation Age of Onset   Cancer Mother    Heart disease Mother    Hyperlipidemia Mother    Heart disease Father    Hyperlipidemia Father     ROS: Otherwise negative unless mentioned in HPI  Physical Examination  Vitals:   09/03/22 1245  BP: (!) 126/47  Pulse: 71  Resp: 18  Temp: 98.4 F (36.9 C)  SpO2: 95%   There is no height or weight on file to calculate BMI.  General:  WDWN in NAD Gait: Not observed HENT: WNL, normocephalic Pulmonary: normal non-labored breathing, without Rales, rhonchi,  wheezing Cardiac: regular, without  Murmurs, rubs or gallops; without carotid bruits Abdomen: Positive bowel sounds, soft, NT/ND, no masses Skin: without rashes Vascular Exam/Pulses: Palpable pulses to left lower extremity, Unable to palpate PT/DP pulses on the right due to +2 edema. Extremities: with ischemic changes, without Gangrene , with cellulitis; without open wounds;  Musculoskeletal: no muscle wasting or atrophy  Neurologic: A&O X 3;  No focal weakness or paresthesias are detected; speech is fluent/normal Psychiatric:  The pt has Normal affect. Lymph:  Unremarkable  CBC    Component Value Date/Time   WBC 6.7 06/13/2019 1612   WBC 7.3 01/27/2017 1016   RBC 4.50 06/13/2019  1612   RBC 4.53 01/27/2017 1016   HGB 14.0 06/13/2019 1612   HCT 41.5 06/13/2019 1612   PLT 258 06/13/2019 1612   MCV 92 06/13/2019 1612   MCH 31.1 06/13/2019 1612   MCH 30.8 01/27/2017 1016   MCHC 33.7 06/13/2019 1612   MCHC 34.0 01/27/2017 1016   RDW 12.8 06/13/2019 1612   LYMPHSABS 2.1 06/13/2019 1612   EOSABS 0.6 (H) 06/13/2019 1612   BASOSABS 0.1 06/13/2019 1612    BMET    Component Value Date/Time   NA 134 06/18/2020 1556   K 4.1 06/18/2020 1556   CL 99 06/18/2020 1556   CO2 18 (L) 06/18/2020 1556   GLUCOSE 303 (H) 06/18/2020 1556   GLUCOSE 126 (H) 01/27/2017 1016   BUN 21 06/18/2020 1556   CREATININE 1.23 06/18/2020 1556   CALCIUM 9.4 06/18/2020 1556   GFRNONAA 57 (L) 06/18/2020 1556   GFRAA 66 06/18/2020 1556    COAGS: No results found for: "INR", "PROTIME"   Non-Invasive Vascular Imaging:   EXAM: RIGHT LOWER EXTREMITY VENOUS DOPPLER ULTRASOUND   TECHNIQUE: Gray-scale sonography with compression, as well as color and duplex ultrasound, were performed to evaluate the deep venous system(s) from the level of the common femoral vein through the popliteal and proximal calf veins.   COMPARISON:  None Available.   FINDINGS: VENOUS   Normal compressibility of the common femoral, superficial femoral, and popliteal veins, as well as the visualized calf veins. Visualized portions of profunda femoral vein and great saphenous vein unremarkable. No filling defects to suggest DVT on grayscale or color Doppler imaging. Doppler waveforms show normal direction of venous flow, normal respiratory plasticity and response to augmentation.   Limited views of the contralateral common femoral vein are unremarkable.   OTHER   None.   Limitations: none   IMPRESSION: No right lower extremity DVT.  EXAM: 08/08/2022 NONINVASIVE PHYSIOLOGIC VASCULAR STUDY OF BILATERAL LOWER EXTREMITIES   TECHNIQUE: Evaluation of both lower extremities were performed at  rest, including calculation of ankle-brachial indices with single level Doppler, pressure and pulse volume recording.   COMPARISON:  None Available.   FINDINGS: Right ABI:    0.37   Left ABI: 0.75   Right Lower Extremity:  Abnormal monophasic arterial waveforms.   Left Lower Extremity:  Normal arterial waveforms at the ankle.   < 0.5 Severe PAD   IMPRESSION: 1. Severely abnormal resting right ankle-brachial index of 0.37 consistent with severe peripheral arterial disease. 2. Moderately abnormal resting left ankle-brachial index of 0.75 consistent with moderate peripheral arterial disease.  Statin:  Yes.   Beta Blocker:  Yes.   Aspirin:  Yes.   ACEI:  Yes.   ARB:  Yes.   CCB use:  No Other antiplatelets/anticoagulants:  Yes.   Pletal   ASSESSMENT/PLAN: This is a 77 y.o. male who presents to [facility] emergency department with increasing pain and swelling to his right lower extremity.  Patient states he has pain upon exercise as well as at rest.  In his right foot started 5 days ago.  Patient endorses having a bilateral lower extremity ultrasound of his legs which revealed abnormal blood flow to his right leg.  Plan: Vascular surgery will take the patient to the vascular lab either tomorrow or Thursday, 09/04/2022 or Friday, 09/05/2022 for a right lower extremity angiogram with possible intervention.  I discussed in detail with the patient and his wife at the bedside the procedure, benefits, complications, and risks.  Both verbalized their understanding and want to proceed with the procedure as soon as possible.  I answered all their questions today.  Patient will be made n.p.o. after midnight for possible procedure tomorrow.  Was started on a heparin infusion while in the emergency department.  I discussed the plan in detail with Dr Jason Dew MD and he agrees with the plan.    Jovane Foutz R [facility] 09/03/2022 3:59 PM  

## 2022-09-03 NOTE — ED Provider Notes (Signed)
Patient has a known history of peripheral arterial disease and is followed by Dr. Wyn Quaker.  Recent ultrasound that was concerning.  After discussion with vascular surgery recommended admission for concern for peripheral arterial occlusion.  Heparin bolus and infusion and consultation to admit to the hospitalist.   Corena Herter, MD 09/03/22 1557

## 2022-09-03 NOTE — Consult Note (Signed)
ANTICOAGULATION CONSULT NOTE - Initial Consult  Pharmacy Consult for Heparin Infusion Indication: Vascular Occlusion  Allergies  Allergen Reactions   Amlodipine Itching    Patient Measurements: Height: 5\' 9"  (175.3 cm) Weight: 85.3 kg (188 lb) IBW/kg (Calculated) : 70.7 kg Heparin Dosing Weight: 85.3 kg  Vital Signs: Temp: 98.4 F (36.9 C) (05/01 1245) Temp Source: Oral (05/01 1245) BP: 126/47 (05/01 1245) Pulse Rate: 71 (05/01 1245)  Labs: No results for input(s): "HGB", "HCT", "PLT", "APTT", "LABPROT", "INR", "HEPARINUNFRC", "HEPRLOWMOCWT", "CREATININE", "CKTOTAL", "CKMB", "TROPONINIHS" in the last 72 hours.  CrCl cannot be calculated (Patient's most recent lab result is older than the maximum 21 days allowed.).   Medical History: Past Medical History:  Diagnosis Date   Diabetes mellitus without complication (HCC)    borderline   Hepatomegaly    Hyperlipidemia    Hypertension    Substance abuse (HCC)     Medications:  Scheduled:    morphine injection  4 mg Intravenous Once   ondansetron (ZOFRAN) IV  4 mg Intravenous Once   Infusions:  PRN:   Assessment: William Ball is a 77 y.o. male presenting with concern for peripheral arterial occlusion. PMH significant for HLD, T2DM, CKD3, HTN, GERD, BPH, PAD. Patient reports increased redness swelling and pain of the right lower leg for the last few days. He had ultrasound on 4/4 which was negative for DVT. Patient was not on [facility] PTA per chart review. Pharmacy has been consulted to initiate and manage heparin infusion.   Baseline Labs: baseline aPTT, PT/INR and CBC ordered  Goal of Therapy:  Heparin level 0.3-0.7 units/ml Monitor platelets by anticoagulation protocol: Yes   Plan:  Give 5100 units bolus x 1 Start heparin infusion at 1350 units/hr Check HL in 8 hours  Continue to monitor H&H and platelets daily while on heparin infusion   Celene Squibb, PharmD PGY1 Pharmacy Resident 09/03/2022 4:35 PM

## 2022-09-03 NOTE — Assessment & Plan Note (Signed)
Terazosin 2 mg nightly

## 2022-09-03 NOTE — Telephone Encounter (Signed)
Patient called in stating that pain meds aren't working for him at all right now. Should he go to ER??? Did the doctor miss anything when he came into office for visit??? What does he need to do ???   Please advise

## 2022-09-03 NOTE — Assessment & Plan Note (Signed)
Aspirin 81 mg daily resumed, atorvastatin 20 mg daily resumed Pletal not resumed on admission as patient's current heparin GGT

## 2022-09-03 NOTE — H&P (View-Only) (Signed)
Hospital Consult    Reason for Consult:  Right lower extremity pain Requesting Physician:  Dr Corena Herter MD MRN #:  [id number]  History of Present Illness: This is a 77 y.o. male who presents to [facility] emergency department today with increasing pain to his right lower extremity with swelling.  Patient endorses over the last 2 weeks he has had increased pain with exercise and at rest.  Endorses right foot swelling started 5 days ago and has become warm to touch.  Denies any chest pain or shortness of breath today.  Patient denies any nausea vomiting or diarrhea.  No other complaints today vitals all remained stable.  Patient also endorses last month he had bilateral lower extremity ultrasounds which showed decreased blood flow in his right leg.  Past Medical History:  Diagnosis Date   Diabetes mellitus without complication (HCC)    borderline   Hepatomegaly    Hyperlipidemia    Hypertension    Substance abuse (HCC)     Past Surgical History:  Procedure Laterality Date   ANGIOPLASTY     APPENDECTOMY     BONE MARROW TRANSPLANT Left    ankle.  patient unsure of this but thinks this is what happened   CARDIAC SURGERY     CATARACT EXTRACTION W/PHACO Left 11/13/2015   Procedure: CATARACT EXTRACTION PHACO AND INTRAOCULAR LENS PLACEMENT (IOC);  Surgeon: Galen Manila, MD;  Location: [facility] ORS;  Service: Ophthalmology;  Laterality: Left;  [place] 1.06 AP% 23.9 CDE 15.81 Fluid pack lot # [id number]   CATARACT EXTRACTION W/PHACO Right 11/27/2015   Procedure: CATARACT EXTRACTION PHACO AND INTRAOCULAR LENS PLACEMENT (IOC);  Surgeon: Galen Manila, MD;  Location: [facility] ORS;  Service: Ophthalmology;  Laterality: Right;  [place] 1.06 AP% 23.8 CDE 15.80 Fluid pack lot # [id number]   COLONOSCOPY  2012   polyps, diverticulosis , 78yr repeat   COLONOSCOPY WITH PROPOFOL N/A 07/17/2016   Procedure: COLONOSCOPY WITH PROPOFOL;  Surgeon: Wyline Mood, MD;  Location: [facility] ENDOSCOPY;  Service: Endoscopy;   Laterality: N/A;   FRACTURE SURGERY Left 1982   foot, leg.unsure if metal in these parts.possibly replaced ankle   PERIPHERAL VASCULAR CATHETERIZATION Left 12/27/2015   Procedure: Lower Extremity Angiography;  Surgeon: Annice Needy, MD;  Location: [facility] INVASIVE CV LAB;  Service: Cardiovascular;  Laterality: Left;    Allergies  Allergen Reactions   Amlodipine Itching    Prior to Admission medications   Medication Sig Start Date End Date Taking? Authorizing Provider  albuterol (VENTOLIN HFA) 108 (90 Base) MCG/ACT inhaler Inhale into the lungs. 05/23/21   [provider]  Aspirin 81 MG CAPS Take by mouth.    [provider]  atorvastatin (LIPITOR) 20 MG tablet TAKE 1 TABLET BY MOUTH EVERY DAY 04/15/20   Johnson, Megan P, DO  benazepril (LOTENSIN) 40 MG tablet Take 1 tablet (40 mg total) by mouth daily. Patient not taking: Reported on 09/01/2022 07/05/20   Debbe Odea, MD  carvedilol (COREG) 3.125 MG tablet Take 3.125 mg by mouth 2 (two) times daily. 04/09/22   [provider]  chlorthalidone (HYGROTON) 50 MG tablet Take 1 tablet (50 mg total) by mouth daily. MUST BE SEEN IN CLINIC FOR FURTHER REFILLS ON MEDICATION 05/27/21   Debbe Odea, MD  cilostazol (PLETAL) 50 MG tablet Take 50 mg by mouth 2 (two) times daily. 08/13/22   [provider]  glipiZIDE (GLUCOTROL) 10 MG tablet TAKE 1 TABLET (10 MG TOTAL) BY MOUTH 2 (TWO) TIMES DAILY BEFORE A MEAL. 04/09/20  Johnson, Megan P, DO  HYDROcodone-acetaminophen (NORCO) 5-325 MG tablet Take 1 tablet by mouth every 12 (twelve) hours as needed for moderate pain or severe pain. 09/02/22   Schnier, Latina Craver, MD  Insulin Pen Needle (NOVOFINE PEN NEEDLE) 32G X 6 MM MISC Use once daily with Lantus pen 10/02/20   [provider]  JARDIANCE 10 MG TABS tablet Take 10 mg by mouth daily.    [provider]  Omega-3 Fatty Acids (FISH OIL PO) Take by mouth daily.    [provider]  spironolactone  (ALDACTONE) 25 MG tablet Take 1 tablet by mouth daily. 06/30/22   [provider]  terazosin (HYTRIN) 2 MG capsule Take 2 mg by mouth at bedtime.    [provider]  TRESIBA FLEXTOUCH 100 UNIT/ML FlexTouch Pen Inject into the skin. 08/06/22   [provider]  valsartan (DIOVAN) 320 MG tablet 160 mg. 04/15/22 04/15/23  [provider]    Social History   Socioeconomic History   Marital status: Married    Spouse name: Not on file   Number of children: Not on file   Years of education: Not on file   Highest education level: 9th grade  Occupational History   Occupation: working full time   Tobacco Use   Smoking status: Every Day    Packs/day: 1.00    Years: 55.00    Additional pack years: 0.00    Total pack years: 55.00    Types: Cigarettes   Smokeless tobacco: Never  Vaping Use   Vaping Use: Never used  Substance and Sexual Activity   Alcohol use: Yes    Alcohol/week: 28.0 standard drinks of alcohol    Types: 28 Cans of beer per week    Comment: 4-6 daily beer   Drug use: No   Sexual activity: Not on file  Other Topics Concern   Not on file  Social History Narrative   Retired   International aid/development worker of Corporate investment banker Strain: Low Risk  (08/06/2017)   Overall Financial Resource Strain (CARDIA)    Difficulty of Paying Living Expenses: Not hard at all  Food Insecurity: No Food Insecurity (08/06/2017)   Hunger Vital Sign    Worried About Running Out of Food in the Last Year: Never true    Ran Out of Food in the Last Year: Never true  Transportation Needs: No Transportation Needs (08/06/2017)   PRAPARE - Administrator, Civil Service (Medical): No    Lack of Transportation (Non-Medical): No  Physical Activity: Inactive (08/06/2017)   Exercise Vital Sign    Days of Exercise per Week: 0 days    Minutes of Exercise per Session: 0 min  Stress: No Stress Concern Present (08/06/2017)   [employer] of Occupational Health -  Occupational Stress Questionnaire    Feeling of Stress : Not at all  Social Connections: Moderately Isolated (08/06/2017)   Social Connection and Isolation Panel [NHANES]    Frequency of Communication with Friends and Family: Never    Frequency of Social Gatherings with Friends and Family: Once a week    Attends Religious Services: Never    Database administrator or Organizations: No    Attends Banker Meetings: Never    Marital Status: Married  Catering manager Violence: Not At Risk (08/06/2017)   Humiliation, Afraid, Rape, and Kick questionnaire    Fear of Current or Ex-Partner: No    Emotionally Abused: No    Physically  Abused: No    Sexually Abused: No     Family History  Problem Relation Age of Onset   Cancer Mother    Heart disease Mother    Hyperlipidemia Mother    Heart disease Father    Hyperlipidemia Father     ROS: Otherwise negative unless mentioned in HPI  Physical Examination  Vitals:   09/03/22 1245  BP: (!) 126/47  Pulse: 71  Resp: 18  Temp: 98.4 F (36.9 C)  SpO2: 95%   There is no height or weight on file to calculate BMI.  General:  WDWN in NAD Gait: Not observed HENT: WNL, normocephalic Pulmonary: normal non-labored breathing, without Rales, rhonchi,  wheezing Cardiac: regular, without  Murmurs, rubs or gallops; without carotid bruits Abdomen: Positive bowel sounds, soft, NT/ND, no masses Skin: without rashes Vascular Exam/Pulses: Palpable pulses to left lower extremity, Unable to palpate PT/DP pulses on the right due to +2 edema. Extremities: with ischemic changes, without Gangrene , with cellulitis; without open wounds;  Musculoskeletal: no muscle wasting or atrophy  Neurologic: A&O X 3;  No focal weakness or paresthesias are detected; speech is fluent/normal Psychiatric:  The pt has Normal affect. Lymph:  Unremarkable  CBC    Component Value Date/Time   WBC 6.7 06/13/2019 1612   WBC 7.3 01/27/2017 1016   RBC 4.50 06/13/2019  1612   RBC 4.53 01/27/2017 1016   HGB 14.0 06/13/2019 1612   HCT 41.5 06/13/2019 1612   PLT 258 06/13/2019 1612   MCV 92 06/13/2019 1612   MCH 31.1 06/13/2019 1612   MCH 30.8 01/27/2017 1016   MCHC 33.7 06/13/2019 1612   MCHC 34.0 01/27/2017 1016   RDW 12.8 06/13/2019 1612   LYMPHSABS 2.1 06/13/2019 1612   EOSABS 0.6 (H) 06/13/2019 1612   BASOSABS 0.1 06/13/2019 1612    BMET    Component Value Date/Time   NA 134 06/18/2020 1556   K 4.1 06/18/2020 1556   CL 99 06/18/2020 1556   CO2 18 (L) 06/18/2020 1556   GLUCOSE 303 (H) 06/18/2020 1556   GLUCOSE 126 (H) 01/27/2017 1016   BUN 21 06/18/2020 1556   CREATININE 1.23 06/18/2020 1556   CALCIUM 9.4 06/18/2020 1556   GFRNONAA 57 (L) 06/18/2020 1556   GFRAA 66 06/18/2020 1556    COAGS: No results found for: "INR", "PROTIME"   Non-Invasive Vascular Imaging:   EXAM: RIGHT LOWER EXTREMITY VENOUS DOPPLER ULTRASOUND   TECHNIQUE: Gray-scale sonography with compression, as well as color and duplex ultrasound, were performed to evaluate the deep venous system(s) from the level of the common femoral vein through the popliteal and proximal calf veins.   COMPARISON:  None Available.   FINDINGS: VENOUS   Normal compressibility of the common femoral, superficial femoral, and popliteal veins, as well as the visualized calf veins. Visualized portions of profunda femoral vein and great saphenous vein unremarkable. No filling defects to suggest DVT on grayscale or color Doppler imaging. Doppler waveforms show normal direction of venous flow, normal respiratory plasticity and response to augmentation.   Limited views of the contralateral common femoral vein are unremarkable.   OTHER   None.   Limitations: none   IMPRESSION: No right lower extremity DVT.  EXAM: 08/08/2022 NONINVASIVE PHYSIOLOGIC VASCULAR STUDY OF BILATERAL LOWER EXTREMITIES   TECHNIQUE: Evaluation of both lower extremities were performed at  rest, including calculation of ankle-brachial indices with single level Doppler, pressure and pulse volume recording.   COMPARISON:  None Available.   FINDINGS: Right ABI:  0.37   Left ABI: 0.75   Right Lower Extremity:  Abnormal monophasic arterial waveforms.   Left Lower Extremity:  Normal arterial waveforms at the ankle.   < 0.5 Severe PAD   IMPRESSION: 1. Severely abnormal resting right ankle-brachial index of 0.37 consistent with severe peripheral arterial disease. 2. Moderately abnormal resting left ankle-brachial index of 0.75 consistent with moderate peripheral arterial disease.  Statin:  Yes.   Beta Blocker:  Yes.   Aspirin:  Yes.   ACEI:  Yes.   ARB:  Yes.   CCB use:  No Other antiplatelets/anticoagulants:  Yes.   Pletal   ASSESSMENT/PLAN: This is a 77 y.o. male who presents to [facility] emergency department with increasing pain and swelling to his right lower extremity.  Patient states he has pain upon exercise as well as at rest.  In his right foot started 5 days ago.  Patient endorses having a bilateral lower extremity ultrasound of his legs which revealed abnormal blood flow to his right leg.  Plan: Vascular surgery will take the patient to the vascular lab either tomorrow or Thursday, 09/04/2022 or Friday, 09/05/2022 for a right lower extremity angiogram with possible intervention.  I discussed in detail with the patient and his wife at the bedside the procedure, benefits, complications, and risks.  Both verbalized their understanding and want to proceed with the procedure as soon as possible.  I answered all their questions today.  Patient will be made n.p.o. after midnight for possible procedure tomorrow.  Was started on a heparin infusion while in the emergency department.  I discussed the plan in detail with Dr Festus Barren MD and he agrees with the plan.    Marcie Bal [facility] 09/03/2022 3:59 PM

## 2022-09-03 NOTE — ED Provider Notes (Signed)
[facility] Provider Note    Event Date/Time   First MD Initiated Contact with Patient 09/03/22 1317     (approximate)   History   Leg Swelling   HPI  William Ball is a 77 y.o. male with history of PAD presents emergency department with increased redness swelling and pain of the right lower leg for the last few days.  Patient had ultrasound on 4/4 which was negative for DVT.  States worsening symptoms over the past week.  Was also told he had 37% blockage in the arterial vasculature of his leg by Dr. Gilda Crease.  Denies chest pain or shortness of breath.  Patient is also diabetic.  He is on insulin.      Physical Exam   Triage Vital Signs: ED Triage Vitals  Enc Vitals Group     BP 09/03/22 1245 (!) 126/47     Pulse Rate 09/03/22 1245 71     Resp 09/03/22 1245 18     Temp 09/03/22 1245 98.4 F (36.9 C)     Temp Source 09/03/22 1245 Oral     SpO2 09/03/22 1245 95 %     Weight --      Height --      Head Circumference --      Peak Flow --      Pain Score 09/03/22 1246 6     Pain Loc --      Pain Edu? --      Excl. in GC? --     Most recent vital signs: Vitals:   09/03/22 1245 09/03/22 1702  BP: (!) 126/47 (!) 157/53  Pulse: 71 (!) 57  Resp: 18 18  Temp: 98.4 F (36.9 C) 98.1 F (36.7 C)  SpO2: 95% 95%     General: Awake, no distress.   CV:  Good peripheral perfusion. regular rate and  rhythm Resp:  Normal effort. Lungs cta Abd:  No distention.   Other:  Right lower leg swollen, red tender near the foot and calf, Doppler used to assess pulse which was difficult to find, is a very faint pulse noted distally at the dorsalis pedis   ED Results / Procedures / Treatments   Labs (all labs ordered are listed, but only abnormal results are displayed) Labs Reviewed  COMPREHENSIVE METABOLIC PANEL - Abnormal; Notable for the following components:      Result Value   BUN 39 (*)    Creatinine, Ser 1.49 (*)    Total Protein 8.5 (*)    GFR,  Estimated 48 (*)    All other components within normal limits  CBC WITH DIFFERENTIAL/PLATELET - Abnormal; Notable for the following components:   Eosinophils Absolute 0.8 (*)    All other components within normal limits  PROTIME-INR  APTT  HEPARIN LEVEL (UNFRACTIONATED)  BASIC METABOLIC PANEL  CBC     EKG     RADIOLOGY Ultrasound of the right lower extremity for DVT    PROCEDURES:   .Critical Care E&M  Performed by: Faythe Ghee, PA-C Critical care provider statement:    Critical care time (minutes):  30   Critical care time was exclusive of:  Separately billable procedures and treating other patients   Critical care was necessary to treat or prevent imminent or life-threatening deterioration of the following conditions:  Circulatory failure   Critical care was time spent personally by me on the following activities:  Blood draw for specimens, development of treatment plan with patient or surrogate,  discussions with consultants, examination of patient, obtaining history from patient or surrogate, ordering and performing treatments and interventions, ordering and review of laboratory studies, ordering and review of radiographic studies, re-evaluation of patient's condition and review of old charts   Care discussed with: admitting provider   After initial E/M assessment, critical care services were subsequently performed that were exclusive of separately billable procedures or treatment.      MEDICATIONS ORDERED IN ED: Medications  heparin ADULT infusion 100 units/mL (25000 units/236mL) (1,350 Units/hr Intravenous New Bag/Given 09/03/22 1658)  acetaminophen (TYLENOL) tablet 650 mg (has no administration in time range)    Or  acetaminophen (TYLENOL) suppository 650 mg (has no administration in time range)  ondansetron (ZOFRAN) tablet 4 mg (has no administration in time range)    Or  ondansetron (ZOFRAN) injection 4 mg (has no administration in time range)   senna-docusate (Senokot-S) tablet 1 tablet (has no administration in time range)  fentaNYL (SUBLIMAZE) injection 25 mcg (has no administration in time range)  morphine (PF) 4 MG/ML injection 4 mg (has no administration in time range)  ondansetron (ZOFRAN) injection 4 mg (4 mg Intravenous Given 09/03/22 1627)  morphine (PF) 4 MG/ML injection 4 mg (4 mg Intravenous Given 09/03/22 1626)  heparin bolus via infusion 5,100 Units (5,100 Units Intravenous Bolus from Bag 09/03/22 1700)     IMPRESSION / MDM / ASSESSMENT AND PLAN / ED COURSE  I reviewed the triage vital signs and the nursing notes.                              Differential diagnosis includes, but is not limited to, DVT, PAD, arterial insufficiency, diabetic neuropathy  Patient's presentation is most consistent with acute presentation with potential threat to life or bodily function.   Review of the patient's past medical charts he has seen Dr. Gilda Crease in the past month.  Had a ultrasound for arterial insufficiency was diagnosed with 37% blockage.  On/4/24, also had a ultrasound of the lower extremity on 4//24 which was negative for DVT  Ultrasound right lower extremity negative for DVT.  Ultrasound was apparently reviewed and interpreted by me as being negative for acute abnormality  Consult to Dr. Wyn Quaker.  Due to the increased pain in the lower extremity he agrees patient should be admitted they will do angiogram either tomorrow or the next day.  Would like for me to have hospitalist admit.  Consult hospitalist  Patient's labs are reassuring and appear to be in his normal baseline.  Creatinine is a little elevated but is very close to his baseline do not feel that he is in AKI  Spoke with DR Cox, she will be admitting the patient, he is hemodynamically stable at this time     FINAL CLINICAL IMPRESSION(S) / ED DIAGNOSES   Final diagnoses:  Arterial insufficiency of lower extremity (HCC)  Right leg pain     Rx / DC Orders    ED Discharge Orders     None        Note:  This document was prepared using Dragon voice recognition software and may include unintentional dictation errors.    Faythe Ghee, PA-C 09/03/22 1736    Corena Herter, MD 09/03/22 2036

## 2022-09-03 NOTE — Telephone Encounter (Signed)
I called and asked the patient about his symptoms.  He notes that he is not able to move his foot from the knee down.  He also notes that he feels very cold and has unrelenting pain.  Based on this I did advise him to proceed to the emergency room.

## 2022-09-03 NOTE — Assessment & Plan Note (Signed)
Resumed home carvedilol 3.125 mg p.o. twice daily, chlorthalidone 50 mg daily, irbesartan 300 mg daily, spironolactone 25 mg daily

## 2022-09-03 NOTE — H&P (Signed)
History and Physical   William Ball RKY:[id number] DOB: 15-Feb-1946 DOA: 09/03/2022  PCP: Luciana Axe, NP  Patient coming from: Home  I have personally briefly reviewed patient's old medical records in [facility] EMR.  Chief Concern: Worse right leg pain  HPI:  [name] is a 77 year old male with history of PAD, hypertension, hyperlipidemia, non-insulin-dependent diabetes mellitus, who presents to emergency department for chief concerns of right limb pain.  Vitals in the ED showed temperature of 98.1, respiration rate 18, heart rate of 57, blood pressure 157/53, SpO2 of 95% on room air.  Serum sodium is 136, potassium 4.0, chloride 102, bicarb 23, BUN of 39, serum creatinine 1.49, EGFR 48, nonfasting blood glucose 72, WBC 9.5, hemoglobin 15.8, platelets of 315.  ED treatment: Morphine 4 mg IV one-time dose, ondansetron 4 mg IV one-time dose, heparin per pharmacy.  EDP consulted vascular who states they will see the patient. ---------------------------- At bedside patient was able to tell me name, age, current location, current calendar year.  He reports he has been having right lower leg pain since latter half of of February of the year.  He denies trauma to his person.  He denies fever, cough chest pain, shortness of breath, dysuria, hematuria, diarrhea, syncope.  Social history: He versus over 1 pack of cigarettes per day smoking.  He denies recreational drug use.  He endorses two 25 ounce cans of beer per day.  ROS: Constitutional: no weight change, no fever ENT/Mouth: no sore throat, no rhinorrhea Eyes: no eye pain, no vision changes Cardiovascular: no chest pain, no dyspnea,  no edema, no palpitations Respiratory: no cough, no sputum, no wheezing Gastrointestinal: no nausea, no vomiting, no diarrhea, no constipation Genitourinary: no urinary incontinence, no dysuria, no hematuria Musculoskeletal: no arthralgias, no myalgias, + right leg pain Skin: no skin lesions,  no pruritus, Neuro: + weakness, no loss of consciousness, no syncope Psych: no anxiety, no depression, no decrease appetite Heme/Lymph: no bruising, no bleeding  ED Course: Discussed with emergency medicine provider, patient requiring hospitalization for chief concerns of critical limb ischemia.  Assessment/Plan  Principal Problem:   Acute lower limb ischemia Active Problems:   Hyperlipidemia   Gastroesophageal reflux   BPH (benign prostatic hyperplasia)   Smoking greater than 40 pack years   Aortic atherosclerosis (HCC)   Arterial insufficiency of lower extremity (HCC)   Hypertension associated with diabetes (HCC)   Right leg pain   Alcohol use   Assessment and Plan:  * Acute lower limb ischemia Right lower extremity Continue heparin gtt per pharmacy EDP has consulted vascular Symptomatic support: Morphine 4 mg IV every 4 hours as needed for moderate pain, 18 hours of coverage ordered; fentanyl 25 mcg IV every 3 hours as needed for severe pain, 15 hours ordered; AM team to reevaluate patient at bedside for continued IV opioid pain requirements Admit to PCU, inpatient  I discussed extensively with patient at bedside that he will need to be referred to cardiology by his PCP upon discharge due to increased risk of coronary artery disease given his presentation for ischemic limb.  Patient endorses understanding and compliance.  Alcohol use Patient drinks 50 ounces of beer per day, he denies history of tremors and/or seizure however does not remember the last time he has been without alcohol for more than 2 days Ativan 2 mg IV every 4 hours as needed for anxiety ordered with instructions to administer as appropriate and then let provider know.  If indicated please  initiate CIWA protocol Patient's last drink was evening of 09/02/2022, currently not within window for alcohol withdrawal  Hypertension associated with diabetes (HCC) Resumed home carvedilol 3.125 mg p.o. twice daily,  chlorthalidone 50 mg daily, irbesartan 300 mg daily, spironolactone 25 mg daily  Aortic atherosclerosis (HCC) Aspirin 81 mg daily resumed, atorvastatin 20 mg daily resumed Pletal not resumed on admission as patient's current heparin GGT  Smoking greater than 40 pack years As needed nicotine patch ordered Tobacco cessation counseling given  BPH (benign prostatic hyperplasia) Terazosin 2 mg nightly  Hyperlipidemia Atorvastatin 20 mg daily resumed  Chart reviewed.   DVT prophylaxis: Heparin per pharmacy Code Status: Full code Diet: Heart healthy; n.p.o. after midnight Family Communication: No Disposition Plan: Pending clinical course and vascular evaluation Consults called: Vascular Admission status: PCU, inpatient  Past Medical History:  Diagnosis Date   Diabetes mellitus without complication (HCC)    borderline   Hepatomegaly    Hyperlipidemia    Hypertension    Substance abuse (HCC)    Past Surgical History:  Procedure Laterality Date   ANGIOPLASTY     APPENDECTOMY     BONE MARROW TRANSPLANT Left    ankle.  patient unsure of this but thinks this is what happened   CARDIAC SURGERY     CATARACT EXTRACTION W/PHACO Left 11/13/2015   Procedure: CATARACT EXTRACTION PHACO AND INTRAOCULAR LENS PLACEMENT (IOC);  Surgeon: Galen Manila, MD;  Location: [facility] ORS;  Service: Ophthalmology;  Laterality: Left;  [place] 1.06 AP% 23.9 CDE 15.81 Fluid pack lot # [id number]   CATARACT EXTRACTION W/PHACO Right 11/27/2015   Procedure: CATARACT EXTRACTION PHACO AND INTRAOCULAR LENS PLACEMENT (IOC);  Surgeon: Galen Manila, MD;  Location: [facility] ORS;  Service: Ophthalmology;  Laterality: Right;  [place] 1.06 AP% 23.8 CDE 15.80 Fluid pack lot # [id number]   COLONOSCOPY  2012   polyps, diverticulosis , 108yr repeat   COLONOSCOPY WITH PROPOFOL N/A 07/17/2016   Procedure: COLONOSCOPY WITH PROPOFOL;  Surgeon: Wyline Mood, MD;  Location: [facility] ENDOSCOPY;  Service: Endoscopy;  Laterality: N/A;   FRACTURE  SURGERY Left 1982   foot, leg.unsure if metal in these parts.possibly replaced ankle   PERIPHERAL VASCULAR CATHETERIZATION Left 12/27/2015   Procedure: Lower Extremity Angiography;  Surgeon: Annice Needy, MD;  Location: [facility] INVASIVE CV LAB;  Service: Cardiovascular;  Laterality: Left;   Social History:  reports that he has been smoking cigarettes. He has a 55.00 pack-year smoking history. He has never used smokeless tobacco. He reports current alcohol use of about 28.0 standard drinks of alcohol per week. He reports that he does not use drugs.  Allergies  Allergen Reactions   Amlodipine Itching   Family History  Problem Relation Age of Onset   Cancer Mother    Heart disease Mother    Hyperlipidemia Mother    Heart disease Father    Hyperlipidemia Father    Family history: Family history reviewed and pertinent for heart disease in mother.  Prior to Admission medications   Medication Sig Start Date End Date Taking? Authorizing Provider  albuterol (VENTOLIN HFA) 108 (90 Base) MCG/ACT inhaler Inhale into the lungs. 05/23/21   [provider]  Aspirin 81 MG CAPS Take by mouth.    [provider]  atorvastatin (LIPITOR) 20 MG tablet TAKE 1 TABLET BY MOUTH EVERY DAY 04/15/20   Johnson, Megan P, DO  benazepril (LOTENSIN) 40 MG tablet Take 1 tablet (40 mg total) by mouth daily. Patient not taking: Reported on 09/01/2022 07/05/20   Agbor-Etang,  Arlys John, MD  carvedilol (COREG) 3.125 MG tablet Take 3.125 mg by mouth 2 (two) times daily. 04/09/22   [provider]  chlorthalidone (HYGROTON) 50 MG tablet Take 1 tablet (50 mg total) by mouth daily. MUST BE SEEN IN CLINIC FOR FURTHER REFILLS ON MEDICATION 05/27/21   Debbe Odea, MD  cilostazol (PLETAL) 50 MG tablet Take 50 mg by mouth 2 (two) times daily. 08/13/22   [provider]  glipiZIDE (GLUCOTROL) 10 MG tablet TAKE 1 TABLET (10 MG TOTAL) BY MOUTH 2 (TWO) TIMES DAILY BEFORE A MEAL. 04/09/20   Johnson, Megan P, DO   HYDROcodone-acetaminophen (NORCO) 5-325 MG tablet Take 1 tablet by mouth every 12 (twelve) hours as needed for moderate pain or severe pain. 09/02/22   Schnier, Latina Craver, MD  Insulin Pen Needle (NOVOFINE PEN NEEDLE) 32G X 6 MM MISC Use once daily with Lantus pen 10/02/20   [provider]  JARDIANCE 10 MG TABS tablet Take 10 mg by mouth daily.    [provider]  Omega-3 Fatty Acids (FISH OIL PO) Take by mouth daily.    [provider]  spironolactone (ALDACTONE) 25 MG tablet Take 1 tablet by mouth daily. 06/30/22   [provider]  terazosin (HYTRIN) 2 MG capsule Take 2 mg by mouth at bedtime.    [provider]  TRESIBA FLEXTOUCH 100 UNIT/ML FlexTouch Pen Inject into the skin. 08/06/22   [provider]  valsartan (DIOVAN) 320 MG tablet 160 mg. 04/15/22 04/15/23  [provider]   Physical Exam: Vitals:   09/03/22 1245 09/03/22 1600 09/03/22 1702  BP: (!) 126/47  (!) 157/53  Pulse: 71  (!) 57  Resp: 18  18  Temp: 98.4 F (36.9 C)  98.1 F (36.7 C)  TempSrc: Oral  Oral  SpO2: 95%  95%  Weight:  85.3 kg   Height:  5\' 9"  (1.753 m)    Constitutional: appears age appropriate, calm Eyes: PERRL, lids and conjunctivae normal ENMT: Mucous membranes are moist. Posterior pharynx clear of any exudate or lesions. Age-appropriate dentition. Hearing appropriate Neck: normal, supple, no masses, no thyromegaly Respiratory: clear to auscultation bilaterally, no wheezing, no crackles. Normal respiratory effort. No accessory muscle use.  Cardiovascular: Regular rate and rhythm, no murmurs / rubs / gallops. No extremity edema. 2+ pedal pulses on left lower extremity with weak pulses palpated on right lower extremity. No carotid bruits.  Abdomen: no tenderness, no masses palpated, no hepatosplenomegaly. Bowel sounds positive.  Musculoskeletal: no clubbing / cyanosis. No joint deformity upper and lower extremities. Good ROM, no contractures, no  atrophy. Normal muscle tone.  Skin: no rashes, lesions, ulcers. No induration.  Right limb is colder compared to left limb. Neurologic: Sensation intact. Strength 5/5 in all 4.  Psychiatric: Normal judgment and insight. Alert and oriented x 3. Normal mood.   EKG: Not indicated at this time Chest x-ray on Admission: Not indicated at this time  US Venous Img Lower Unilateral Right  Result Date: 09/03/2022 CLINICAL DATA:  Erythema, pain, swelling in the right lower extremity EXAM: RIGHT LOWER EXTREMITY VENOUS DOPPLER ULTRASOUND TECHNIQUE: Gray-scale sonography with compression, as well as color and duplex ultrasound, were performed to evaluate the deep venous system(s) from the level of the common femoral vein through the popliteal and proximal calf veins. COMPARISON:  None Available. FINDINGS: VENOUS Normal compressibility of the common femoral, superficial femoral, and popliteal veins, as well as the visualized calf veins. Visualized portions of profunda femoral vein and great saphenous  vein unremarkable. No filling defects to suggest DVT on grayscale or color Doppler imaging. Doppler waveforms show normal direction of venous flow, normal respiratory plasticity and response to augmentation. Limited views of the contralateral common femoral vein are unremarkable. OTHER None. Limitations: none IMPRESSION: No right lower extremity DVT. Electronically Signed   By: Gaylyn Rong M.D.   On: 09/03/2022 15:34    Labs on Admission: I have personally reviewed following labs  CBC: Recent Labs  Lab 09/03/22 1610  WBC 9.5  NEUTROABS 4.1  HGB 15.8  HCT 47.1  MCV 90.2  PLT 315   Basic Metabolic Panel: Recent Labs  Lab 09/03/22 1610  NA 136  K 4.0  CL 102  CO2 23  GLUCOSE 72  BUN 39*  CREATININE 1.49*  CALCIUM 9.8   GFR: Estimated Creatinine Clearance: 44.9 mL/min (A) (by C-G formula based on SCr of 1.49 mg/dL (H)).  Liver Function Tests: Recent Labs  Lab 09/03/22 1610  AST 28  ALT  35  ALKPHOS 102  BILITOT 0.5  PROT 8.5*  ALBUMIN 4.4   Coagulation Profile: Recent Labs  Lab 09/03/22 1610  INR 1.0   Urine analysis:    Component Value Date/Time   COLORURINE YELLOW (A) 01/27/2017 1016   APPEARANCEUR Clear 06/13/2019 1611   LABSPEC 1.009 01/27/2017 1016   PHURINE 5.0 01/27/2017 1016   GLUCOSEU Negative 06/13/2019 1611   HGBUR NEGATIVE 01/27/2017 1016   BILIRUBINUR Negative 06/13/2019 1611   KETONESUR NEGATIVE 01/27/2017 1016   PROTEINUR Negative 06/13/2019 1611   PROTEINUR NEGATIVE 01/27/2017 1016   NITRITE Negative 06/13/2019 1611   NITRITE NEGATIVE 01/27/2017 1016   LEUKOCYTESUR Negative 06/13/2019 1611   This document was prepared using Dragon Voice Recognition software and may include unintentional dictation errors.  Dr. Sedalia Muta [facility]  If 7PM-7AM, please contact overnight-coverage provider If 7AM-7PM, please contact day attending provider [email or web address]  09/03/2022, 8:08 PM

## 2022-09-03 NOTE — Hospital Course (Addendum)
[name] is a 77 year old male with history of PAD, hypertension, hyperlipidemia, non-insulin-dependent diabetes mellitus, who presents to emergency department for chief concerns of right limb pain. Patient is placed on heparin drip for leg ischemia, seen by vascular surgery, planning for intervention on 5/8

## 2022-09-03 NOTE — Assessment & Plan Note (Addendum)
Right lower extremity Continue heparin gtt per pharmacy EDP has consulted vascular Symptomatic support: Morphine 4 mg IV every 4 hours as needed for moderate pain, 18 hours of coverage ordered; fentanyl 25 mcg IV every 3 hours as needed for severe pain, 15 hours ordered; AM team to reevaluate patient at bedside for continued IV opioid pain requirements Admit to PCU, inpatient  I discussed extensively with patient at bedside that he will need to be referred to cardiology by his PCP upon discharge due to increased risk of coronary artery disease given his presentation for ischemic limb.  Patient endorses understanding and compliance.

## 2022-09-03 NOTE — Assessment & Plan Note (Signed)
Patient drinks 50 ounces of beer per day, he denies history of tremors and/or seizure however does not remember the last time he has been without alcohol for more than 2 days Ativan 2 mg IV every 4 hours as needed for anxiety ordered with instructions to administer as appropriate and then let provider know.  If indicated please initiate CIWA protocol Patient's last drink was evening of 09/02/2022, currently not within window for alcohol withdrawal

## 2022-09-03 NOTE — Assessment & Plan Note (Addendum)
-   Atorvastatin 20 mg daily resumed 

## 2022-09-03 NOTE — ED Triage Notes (Signed)
Patient to ED for right leg swelling/pain. Seen by doctor 2 days ago and they were concerned for blood clot. States symptoms have been ongoing "for weeks." Patient has seen vascular MD- patient unsure if he has a blood clot. States he is unable to walk due to pain and has been taking prescribed medications with no relief.

## 2022-09-03 NOTE — Assessment & Plan Note (Addendum)
As needed nicotine patch ordered Tobacco cessation counseling given

## 2022-09-04 ENCOUNTER — Encounter
Admission: EM | Disposition: A | Discharge status: Home Health | Payer: Self-pay | Source: Home / Self Care | Attending: Internal Medicine

## 2022-09-04 DIAGNOSIS — F17210 Nicotine dependence, cigarettes, uncomplicated: Secondary | ICD-10-CM | POA: Diagnosis not present

## 2022-09-04 DIAGNOSIS — I739 Peripheral vascular disease, unspecified: Secondary | ICD-10-CM | POA: Diagnosis present

## 2022-09-04 DIAGNOSIS — I70221 Atherosclerosis of native arteries of extremities with rest pain, right leg: Secondary | ICD-10-CM | POA: Diagnosis not present

## 2022-09-04 DIAGNOSIS — T82856A Stenosis of peripheral vascular stent, initial encounter: Secondary | ICD-10-CM | POA: Diagnosis not present

## 2022-09-04 DIAGNOSIS — I70212 Atherosclerosis of native arteries of extremities with intermittent claudication, left leg: Secondary | ICD-10-CM | POA: Diagnosis not present

## 2022-09-04 DIAGNOSIS — I998 Other disorder of circulatory system: Secondary | ICD-10-CM | POA: Diagnosis not present

## 2022-09-04 DIAGNOSIS — I700 Atherosclerosis of aorta: Secondary | ICD-10-CM | POA: Diagnosis not present

## 2022-09-04 HISTORY — PX: LOWER EXTREMITY ANGIOGRAPHY: CATH118251

## 2022-09-04 LAB — BASIC METABOLIC PANEL WITH GFR
Anion gap: 10 (ref 5–15)
BUN: 39 mg/dL — ABNORMAL HIGH (ref 8–23)
CO2: 23 mmol/L (ref 22–32)
Calcium: 9.1 mg/dL (ref 8.9–10.3)
Chloride: 104 mmol/L (ref 98–111)
Creatinine, Ser: 1.54 mg/dL — ABNORMAL HIGH (ref 0.61–1.24)
GFR, Estimated: 46 mL/min — ABNORMAL LOW
Glucose, Bld: 109 mg/dL — ABNORMAL HIGH (ref 70–99)
Potassium: 4 mmol/L (ref 3.5–5.1)
Sodium: 137 mmol/L (ref 135–145)

## 2022-09-04 LAB — CBC
HCT: 41 % (ref 39.0–52.0)
Hemoglobin: 13.7 g/dL (ref 13.0–17.0)
MCH: 30 pg (ref 26.0–34.0)
MCHC: 33.4 g/dL (ref 30.0–36.0)
MCV: 89.9 fL (ref 80.0–100.0)
Platelets: 267 K/uL (ref 150–400)
RBC: 4.56 MIL/uL (ref 4.22–5.81)
RDW: 12.4 % (ref 11.5–15.5)
WBC: 7.7 K/uL (ref 4.0–10.5)
nRBC: 0 % (ref 0.0–0.2)

## 2022-09-04 LAB — HEPARIN LEVEL (UNFRACTIONATED)
Heparin Unfractionated: 0.34 [IU]/mL (ref 0.30–0.70)
Heparin Unfractionated: 0.55 [IU]/mL (ref 0.30–0.70)

## 2022-09-04 SURGERY — LOWER EXTREMITY ANGIOGRAPHY
Anesthesia: Moderate Sedation | Laterality: Right

## 2022-09-04 MED ORDER — SODIUM CHLORIDE 0.9 % IV SOLN: INTRAVENOUS | Status: DC

## 2022-09-04 MED ORDER — ONDANSETRON HCL 4 MG/2ML IJ SOLN: 4.0000 mg | Freq: Four times a day (QID) | INTRAMUSCULAR | Status: DC | PRN

## 2022-09-04 MED ORDER — METHYLPREDNISOLONE SODIUM SUCC 125 MG IJ SOLR: 125.0000 mg | Freq: Once | INTRAMUSCULAR | Status: DC | PRN

## 2022-09-04 MED ORDER — HYDROMORPHONE HCL 1 MG/ML IJ SOLN
0.5000 mg | INTRAMUSCULAR | Status: DC | PRN
  Administered 2022-09-04 – 2022-09-09 (×15): 0.5 mg via INTRAVENOUS
  Filled 2022-09-04 (×14): qty 0.5

## 2022-09-04 MED ORDER — CEFAZOLIN SODIUM-DEXTROSE 2-4 GM/100ML-% IV SOLN
INTRAVENOUS | Status: AC
  Filled 2022-09-04: qty 100

## 2022-09-04 MED ORDER — LORAZEPAM 1 MG PO TABS
1.0000 mg | ORAL_TABLET | ORAL | Status: AC | PRN
  Administered 2022-09-05: 1 mg via ORAL
  Filled 2022-09-04: qty 1

## 2022-09-04 MED ORDER — FAMOTIDINE 20 MG PO TABS: 40.0000 mg | ORAL_TABLET | Freq: Once | ORAL | Status: DC | PRN

## 2022-09-04 MED ORDER — HYDROMORPHONE HCL 1 MG/ML IJ SOLN
1.0000 mg | Freq: Once | INTRAMUSCULAR | Status: AC | PRN
  Administered 2022-09-05: 1 mg via INTRAVENOUS
  Filled 2022-09-04: qty 1

## 2022-09-04 MED ORDER — MIDAZOLAM HCL 5 MG/5ML IJ SOLN
INTRAMUSCULAR | Status: AC
  Filled 2022-09-04: qty 5

## 2022-09-04 MED ORDER — CEFAZOLIN SODIUM-DEXTROSE 1-4 GM/50ML-% IV SOLN
INTRAVENOUS | Status: DC | PRN
  Administered 2022-09-04: 2 g via INTRAVENOUS

## 2022-09-04 MED ORDER — HYDROMORPHONE HCL 1 MG/ML IJ SOLN
INTRAMUSCULAR | Status: AC
  Filled 2022-09-04: qty 0.5

## 2022-09-04 MED ORDER — FOLIC ACID 1 MG PO TABS
1.0000 mg | ORAL_TABLET | Freq: Every day | ORAL | Status: DC
  Administered 2022-09-04 – 2022-09-15 (×11): 1 mg via ORAL
  Filled 2022-09-04 (×11): qty 1

## 2022-09-04 MED ORDER — HEPARIN SODIUM (PORCINE) 1000 UNIT/ML IJ SOLN
INTRAMUSCULAR | Status: DC | PRN
  Administered 2022-09-04: 4000 [IU] via INTRAVENOUS

## 2022-09-04 MED ORDER — CEFAZOLIN SODIUM-DEXTROSE 2-4 GM/100ML-% IV SOLN: 2.0000 g | INTRAVENOUS | Status: DC

## 2022-09-04 MED ORDER — FENTANYL CITRATE PF 50 MCG/ML IJ SOSY: 12.5000 ug | PREFILLED_SYRINGE | Freq: Once | INTRAMUSCULAR | Status: DC | PRN

## 2022-09-04 MED ORDER — THIAMINE HCL 100 MG/ML IJ SOLN
100.0000 mg | Freq: Every day | INTRAMUSCULAR | Status: DC
  Filled 2022-09-04 (×2): qty 2

## 2022-09-04 MED ORDER — THIAMINE MONONITRATE 100 MG PO TABS
100.0000 mg | ORAL_TABLET | Freq: Every day | ORAL | Status: DC
  Administered 2022-09-04 – 2022-09-15 (×11): 100 mg via ORAL
  Filled 2022-09-04 (×11): qty 1

## 2022-09-04 MED ORDER — ADULT MULTIVITAMIN W/MINERALS CH
1.0000 | ORAL_TABLET | Freq: Every day | ORAL | Status: DC
  Administered 2022-09-04 – 2022-09-15 (×11): 1 via ORAL
  Filled 2022-09-04 (×11): qty 1

## 2022-09-04 MED ORDER — OXYCODONE-ACETAMINOPHEN 5-325 MG PO TABS
1.0000 | ORAL_TABLET | ORAL | Status: DC | PRN
  Administered 2022-09-04 – 2022-09-12 (×12): 1 via ORAL
  Filled 2022-09-04 (×14): qty 1

## 2022-09-04 MED ORDER — MIDAZOLAM HCL 2 MG/ML PO SYRP: 8.0000 mg | ORAL_SOLUTION | Freq: Once | ORAL | Status: DC | PRN

## 2022-09-04 MED ORDER — FENTANYL CITRATE (PF) 100 MCG/2ML IJ SOLN
INTRAMUSCULAR | Status: DC | PRN
  Administered 2022-09-04: 50 ug via INTRAVENOUS

## 2022-09-04 MED ORDER — DIPHENHYDRAMINE HCL 50 MG/ML IJ SOLN: 50.0000 mg | Freq: Once | INTRAMUSCULAR | Status: DC | PRN

## 2022-09-04 MED ORDER — MIDAZOLAM HCL 2 MG/2ML IJ SOLN
INTRAMUSCULAR | Status: DC | PRN
  Administered 2022-09-04: 2 mg via INTRAVENOUS

## 2022-09-04 MED ORDER — HEPARIN SODIUM (PORCINE) 1000 UNIT/ML IJ SOLN
INTRAMUSCULAR | Status: AC
  Filled 2022-09-04: qty 10

## 2022-09-04 MED ORDER — FENTANYL CITRATE (PF) 100 MCG/2ML IJ SOLN
INTRAMUSCULAR | Status: AC
  Filled 2022-09-04: qty 2

## 2022-09-04 MED ORDER — CHLORHEXIDINE GLUCONATE 4 % EX SOLN
60.0000 mL | Freq: Once | CUTANEOUS | Status: DC
  Administered 2022-09-04: 4 via TOPICAL

## 2022-09-04 MED ORDER — LORAZEPAM 2 MG/ML IJ SOLN: 1.0000 mg | INTRAMUSCULAR | Status: AC | PRN

## 2022-09-04 MED ORDER — CHLORHEXIDINE GLUCONATE 4 % EX SOLN: 60.0000 mL | Freq: Once | CUTANEOUS | Status: DC

## 2022-09-04 SURGICAL SUPPLY — 10 items
CANNULA 5F STIFF (CANNULA) ×1
CATH ANGIO 5F PIGTAIL 65CM (CATHETERS) ×1
COVER PROBE ULTRASOUND 5X96 (MISCELLANEOUS) ×1
DEVICE STARCLOSE SE CLOSURE (Vascular Products) ×1 IMPLANT
GLIDEWIRE ADV .035X260CM (WIRE) ×1
PACK ANGIOGRAPHY (CUSTOM PROCEDURE TRAY) ×1
SHEATH BRITE TIP 5FRX11 (SHEATH) ×1
SYR MEDRAD MARK 7 150ML (SYRINGE) ×1
TUBING CONTRAST HIGH PRESS 72 (TUBING) ×1
WIRE GUIDERIGHT .035X150 (WIRE) IMPLANT

## 2022-09-04 NOTE — ED Notes (Signed)
Pt states he drinks about 25 oz of beer daily. Pt states he has never had withdrawals from ETOH before. CIWA is a 0

## 2022-09-04 NOTE — Consult Note (Signed)
ANTICOAGULATION CONSULT NOTE  Pharmacy Consult for Heparin Infusion Indication: Vascular Occlusion  Allergies  Allergen Reactions   Amlodipine Itching    Patient Measurements: Height: 5\' 9"  (175.3 cm) Weight: 85.3 kg (188 lb) IBW/kg (Calculated) : 70.7 kg Heparin Dosing Weight: 85.3 kg  Vital Signs: Temp: 98.6 F (37 C) (05/02 0800) Temp Source: Oral (05/02 0800) BP: 156/68 (05/02 1200) Pulse Rate: 60 (05/02 1200)  Labs: Recent Labs    09/03/22 1610 09/04/22 0405 09/04/22 1200  HGB 15.8 13.7  --   HCT 47.1 41.0  --   PLT 315 267  --   APTT 30  --   --   LABPROT 13.3  --   --   INR 1.0  --   --   HEPARINUNFRC  --  0.34 0.55  CREATININE 1.49* 1.54*  --      Estimated Creatinine Clearance: 43.5 mL/min (A) (by C-G formula based on SCr of 1.54 mg/dL (H)).   Medical History: Past Medical History:  Diagnosis Date   Diabetes mellitus without complication (HCC)    borderline   Hepatomegaly    Hyperlipidemia    Hypertension    Substance abuse (HCC)     Medications:  Scheduled:   aspirin EC  81 mg Oral Daily   atorvastatin  20 mg Oral Daily   carvedilol  3.125 mg Oral BID   folic acid  1 mg Oral Daily   irbesartan  300 mg Oral Daily   multivitamin with minerals  1 tablet Oral Daily   terazosin  2 mg Oral QHS   thiamine  100 mg Oral Daily   Or   thiamine  100 mg Intravenous Daily   Infusions:   heparin 1,350 Units/hr (09/04/22 0900)   PRN:   Assessment: William Ball is a 77 y.o. male presenting with concern for peripheral arterial occlusion. PMH significant for HLD, T2DM, CKD3, HTN, GERD, BPH, PAD. Patient reports increased redness swelling and pain of the right lower leg for the last few days. He had ultrasound on 4/4 which was negative for DVT. Patient was not on [facility] PTA per chart review. Pharmacy has been consulted to initiate and manage heparin infusion.   Baseline Labs: baseline aPTT, PT/INR and CBC ordered  Goal of Therapy:  Heparin level  0.3-0.7 units/ml Monitor platelets by anticoagulation protocol: Yes  05/02 0405 HL 0.34, therapeutic x 1 05/02 1200 HL 0.55   Plan:  Heparin remains therapeutic  Continue heparin infusion at 1350 units/hr Recheck HL with AM labs Continue to monitor H&H and platelets daily while on heparin infusion   Sharen Hones, PharmD, BCPS Clinical Pharmacist   09/04/2022 12:28 PM

## 2022-09-04 NOTE — Consult Note (Signed)
ANTICOAGULATION CONSULT NOTE  Pharmacy Consult for Heparin Infusion Indication: Vascular Occlusion  Allergies  Allergen Reactions   Amlodipine Itching    Patient Measurements: Height: 5\' 9"  (175.3 cm) Weight: 85.3 kg (188 lb) IBW/kg (Calculated) : 70.7 kg Heparin Dosing Weight: 85.3 kg  Vital Signs: BP: 155/55 (05/02 0200) Pulse Rate: 58 (05/02 0200)  Labs: Recent Labs    09/03/22 1610 09/04/22 0405  HGB 15.8 13.7  HCT 47.1 41.0  PLT 315 267  APTT 30  --   LABPROT 13.3  --   INR 1.0  --   HEPARINUNFRC  --  0.34  CREATININE 1.49* 1.54*    Estimated Creatinine Clearance: 43.5 mL/min (A) (by C-G formula based on SCr of 1.54 mg/dL (H)).   Medical History: Past Medical History:  Diagnosis Date   Diabetes mellitus without complication (HCC)    borderline   Hepatomegaly    Hyperlipidemia    Hypertension    Substance abuse (HCC)     Medications:  Scheduled:   aspirin EC  81 mg Oral Daily   atorvastatin  20 mg Oral Daily   carvedilol  3.125 mg Oral BID   chlorthalidone  50 mg Oral Daily   irbesartan  300 mg Oral Daily   spironolactone  25 mg Oral Daily   terazosin  2 mg Oral QHS   Infusions:   heparin 1,350 Units/hr (09/03/22 2116)   PRN:   Assessment: William Ball is a 77 y.o. male presenting with concern for peripheral arterial occlusion. PMH significant for HLD, T2DM, CKD3, HTN, GERD, BPH, PAD. Patient reports increased redness swelling and pain of the right lower leg for the last few days. He had ultrasound on 4/4 which was negative for DVT. Patient was not on [facility] PTA per chart review. Pharmacy has been consulted to initiate and manage heparin infusion.   Baseline Labs: baseline aPTT, PT/INR and CBC ordered  Goal of Therapy:  Heparin level 0.3-0.7 units/ml Monitor platelets by anticoagulation protocol: Yes  05/02 0405 HL 0.34, therapeutic x 1   Plan:  Continue heparin infusion at 1350 units/hr Recheck HL in 8 hours to confirm  Continue to  monitor H&H and platelets daily while on heparin infusion   Otelia Sergeant, PharmD, [facility] 09/04/2022 5:12 AM

## 2022-09-04 NOTE — TOC Initial Note (Signed)
Transition of Care [facility]) - Initial/Assessment Note    Patient Details  Name: William Ball MRN: [id number] Date of Birth: 1945/09/14  [facility]) CM/SW Contact:    Margarito Liner, LCSW Phone Number: 09/04/2022, 2:19 PM  Clinical Narrative:   CSW met with patient. No supports at bedside. CSW introduced role and inquired about interest in SA resources. Patient politely declined. No further concerns. CSW encouraged patient to contact CSW as needed. CSW will continue to follow patient for support and facilitate return home once stable. His wife or son will transport him at discharge.               Expected Discharge Plan: Home/Self Care Barriers to Discharge: Continued Medical Work up   Patient Goals and CMS Choice            Expected Discharge Plan and Services     Post Acute Care Choice: NA Living arrangements for the past 2 months: Single Family Home                                      Prior Living Arrangements/Services Living arrangements for the past 2 months: Single Family Home Lives with:: Spouse Patient language and need for interpreter reviewed:: Yes Do you feel safe going back to the place where you live?: Yes      Need for Family Participation in Patient Care: Yes (Comment)     Criminal Activity/Legal Involvement Pertinent to Current Situation/Hospitalization: No - Comment as needed  Activities of Daily Living      Permission Sought/Granted                  Emotional Assessment Appearance:: Appears stated age Attitude/Demeanor/Rapport: Engaged, Gracious Affect (typically observed): Appropriate, Calm, Pleasant Orientation: : Oriented to Self, Oriented to Place, Oriented to  Time, Oriented to Situation Alcohol / Substance Use: Alcohol Use Psych Involvement: No (comment)  Admission diagnosis:  Acute lower limb ischemia [I99.8] PVD (peripheral vascular disease) (HCC) [I73.9] Patient Active Problem List   Diagnosis Date Noted    PVD (peripheral vascular disease) (HCC) 09/04/2022   Acute lower limb ischemia 09/03/2022   Right leg pain 09/03/2022   Alcohol use 09/03/2022   Atherosclerosis of native arteries of extremity with rest pain (HCC) 09/02/2022   Atherosclerosis of native arteries of extremity with intermittent claudication (HCC) 08/31/2022   Diabetes (HCC) 08/31/2022   Hypertension associated with diabetes (HCC) 12/06/2018   CKD (chronic kidney disease) stage 3, GFR 30-59 ml/min (HCC) 02/18/2018   Arterial insufficiency of lower extremity (HCC) 09/23/2017   Emphysema lung (HCC) 08/17/2017   Aortic valve calcification 08/17/2017   Aortic atherosclerosis (HCC) 07/28/2016   Advanced care planning/counseling discussion    First degree hemorrhoids    Personal history of tobacco use, presenting hazards to health 06/25/2016   Smoking greater than 40 pack years 12/05/2015   Senile purpura (HCC) 12/05/2015   Hip arthritis 06/07/2015   BPH (benign prostatic hyperplasia) 06/07/2015   Gastroesophageal reflux 01/22/2015   Hyperlipidemia    Type 2 DM with CKD stage 3 and hypertension (HCC)    Hypertensive nephropathy    PCP:  Luciana Axe, NP Pharmacy:   [facility] [phone] - Closed - [place], [place] - [address] [place] [address] Phone: [phone] Fax: [phone]  [id number] - [place] - [address] [place]) [place] [address] Phone: [phone] Fax: [phone]     Social Determinants of Health (SDOH) Social History: SDOH Screenings   Food Insecurity: No Food Insecurity (08/06/2017)  Transportation Needs: No Transportation Needs (08/06/2017)  Depression (PHQ2-9): Low Risk  (06/13/2019)  Financial Resource Strain: Low Risk  (08/06/2017)  Physical Activity: Inactive (08/06/2017)  Social Connections: Moderately Isolated (08/06/2017)  Stress: No Stress Concern Present (08/06/2017)  Tobacco Use: High Risk (09/03/2022)    SDOH Interventions:     Readmission Risk Interventions     No data to display

## 2022-09-04 NOTE — ED Notes (Signed)
Pt going to OR today per B. Pace NP

## 2022-09-04 NOTE — Interval H&P Note (Signed)
History and Physical Interval Note:  09/04/2022 3:52 PM  William Ball  has presented today for surgery, with the diagnosis of PAD.  The various methods of treatment have been discussed with the patient and family. After consideration of risks, benefits and other options for treatment, the patient has consented to  Procedure(s): Lower Extremity Angiography (Right) as a surgical intervention.  The patient's history has been reviewed, patient examined, no change in status, stable for surgery.  I have reviewed the patient's chart and labs.  Questions were answered to the patient's satisfaction.     Festus Barren

## 2022-09-04 NOTE — Progress Notes (Signed)
  Progress Note   Patient: William Ball ZOX:[id number] DOB: 1945-12-03 DOA: 09/03/2022     1 DOS: the patient was seen and examined on 09/04/2022   Brief hospital course:  [name] is a 77 year old male with history of PAD, hypertension, hyperlipidemia, non-insulin-dependent diabetes mellitus, who presents to emergency department for chief concerns of right limb pain. Patient is placed on heparin drip for leg ischemia, seen by vascular surgery, planning for intervention.    Principal Problem:   Acute lower limb ischemia Active Problems:   Hyperlipidemia   Gastroesophageal reflux   BPH (benign prostatic hyperplasia)   Smoking greater than 40 pack years   Aortic atherosclerosis (HCC)   Arterial insufficiency of lower extremity (HCC)   Hypertension associated with diabetes (HCC)   Right leg pain   Alcohol use   Assessment and Plan: * Acute lower limb ischemia Peripheral arterial disease. Tobacco abuse. Patient has been being evaluated by vascular surgery, planning for surgical intervention, timing has not been decided.  Will continue heparin drip, continue as needed pain medicine.  Alcohol use Patient is a heavy alcohol drinker, will monitor closely with CIWA protocol.  Hypertension associated with diabetes (HCC) Continue ARB and beta-blocker, discontinue diuretics while pending surgery.  Aortic atherosclerosis (HCC) Aspirin 81 mg daily resumed, atorvastatin 20 mg daily resumed Pletal not resumed on admission as patient's current heparin GGT  BPH (benign prostatic hyperplasia) Terazosin 2 mg nightly  Hyperlipidemia Atorvastatin 20 mg daily resumed       Subjective:  Right leg pain is better today.  Physical Exam: Vitals:   09/04/22 0730 09/04/22 0800 09/04/22 0900 09/04/22 0930  BP: (!) 162/56 136/63 (!) 153/68 (!) 169/59  Pulse: 61 (!) 53 64 (!) 52  Resp: (!) 21 19 (!) 23 19  Temp:  98.6 F (37 C)    TempSrc:  Oral    SpO2: 99% 93% 97% 96%  Weight:       Height:       General exam: Appears calm and comfortable  Respiratory system: Clear to auscultation. Respiratory effort normal. Cardiovascular system: S1 & S2 heard, RRR. No JVD, murmurs, rubs, gallops or clicks. No pedal edema. Gastrointestinal system: Abdomen is nondistended, soft and nontender. No organomegaly or masses felt. Normal bowel sounds heard. Central nervous system: Alert and oriented. No focal neurological deficits. Extremities: Significant decreased pedal pulse on the right. Skin: No rashes, lesions or ulcers Psychiatry: Judgement and insight appear normal. Mood & affect appropriate.    Data Reviewed:  Lab results reviewed.  Family Communication: None  Disposition: Status is: Inpatient Remains inpatient appropriate because: Severity of disease, IV treatment.  Inpatient procedure.     Time spent: 35 minutes  Author: Marrion Coy, MD 09/04/2022 12:05 PM  For on call review [email or web address].

## 2022-09-04 NOTE — Op Note (Signed)
[facility]  Percutaneous Study/Intervention Procedural Note   Date of Surgery: 09/04/2022  Surgeon(s):Allura Doepke    Assistants:none  Pre-operative Diagnosis: PAD with rest pain right lower extremity and claudication left lower extremity  Post-operative diagnosis:  Same  Procedure(s) Performed:             1.  Ultrasound guidance for vascular access left femoral artery             2.  Catheter placement into right common iliac artery from left femoral approach             3.  Aortogram and selective bilateral lower extremity angiograms             4.  StarClose closure device left femoral artery  EBL: 5 cc  Contrast: 70 cc  Fluoro Time: 3 minutes  Moderate Conscious Sedation Time: approximately 35 minutes using 2 mg of Versed and 50 mcg of Fentanyl              Indications:  Patient is a 77 y.o.male with rest pain on the right and disabling claudication symptoms on the left. The patient has noninvasive study showing markedly reduced perfusion. The patient is brought in for angiography for further evaluation and potential treatment.  Due to the limb threatening nature of the situation, angiogram was performed for attempted limb salvage. The patient is aware that if the procedure fails, amputation would be expected.  The patient also understands that even with successful revascularization, amputation may still be required due to the severity of the situation.  Risks and benefits are discussed and informed consent is obtained.   Procedure:  The patient was identified and appropriate procedural time out was performed.  The patient was then placed supine on the table and prepped and draped in the usual sterile fashion. Moderate conscious sedation was administered during a face to face encounter with the patient throughout the procedure with my supervision of the RN administering medicines and monitoring the patient's vital signs, pulse oximetry, telemetry and mental  status throughout from the start of the procedure until the patient was taken to the recovery room. Ultrasound was used to evaluate the left common femoral artery.  It was heavily diseased.  A digital ultrasound image was acquired.  A Seldinger needle was used to access the left common femoral artery under direct ultrasound guidance and a permanent image was performed.  A 0.035 J wire was advanced without resistance and a 5Fr sheath was placed.  Pigtail catheter was placed into the aorta and an AP aortogram was performed. This demonstrated fairly normal renal arteries.  The aorta was calcific but not stenotic.  The left common iliac artery had some degree of disease within an old stent that was borderline flow-limiting.  The left external iliac artery below the previously placed stent had a 90% or greater stenosis that was highly calcific.  There was significant disease of the left common femoral artery that was seen better on imaging from the sheath subsequently.  The right common iliac artery did not appear to have significant stenosis but the right external iliac artery had a short segment occlusion.  The right common femoral artery was then heavily diseased with an 80% or greater stenosis.  I then crossed the aortic bifurcation and advanced to the right common iliac artery to image the right leg.  With an unsupported pigtail catheter, I was not able to cross all the way down to the femoral artery due  to the disease in the right iliac artery. Selective right lower extremity angiogram was then performed. This demonstrated 80 to 90% stenosis of the right common femoral artery spilling into the origin of the SFA.  The profunda femoris artery appeared to have some disease at its origin but was not as severe.  The remainder of the runoff was fairly sluggish and difficult to see, but there appeared to be a high-grade stenosis in the mid to distal SFA if not a short segment occlusion.  There did appear to be two-vessel  runoff distally although it was very faint due to the inflow disease. It was clear the patient would require surgical therapy of the right leg as well as the left leg at this point due to the common femoral disease.  This would be a hybrid procedure with bilateral iliac intervention and possible right SFA intervention.  I went ahead and image the left lower extremity through the left femoral sheath to evaluate this as well.  This gives [place] better imaging of the left femoral artery as the patient will clearly benefit from a left femoral endarterectomy and left iliac intervention as well. This demonstrated 2 highly calcific lesions 1 in the proximal to mid common femoral artery 1 in the distal common femoral artery on the left both creating greater than 80% stenosis.  The SFA had disease at its origin but then was only mildly diseased in the mid and distal segment and the popliteal artery appeared patent.  There was then three-vessel runoff distally.  I elected to terminate the procedure. The sheath was removed and StarClose closure device was deployed in the left femoral artery with excellent hemostatic result. The patient was taken to the recovery room in stable condition having tolerated the procedure well.  Findings:               Aortogram:  This demonstrated fairly normal renal arteries.  The aorta was calcific but not stenotic.  The left common iliac artery had some degree of disease within an old stent that was borderline flow-limiting.  The left external iliac artery below the previously placed stent had a 90% or greater stenosis that was highly calcific.  There was significant disease of the left common femoral artery that was seen better on imaging from the sheath subsequently.  The right common iliac artery did not appear to have significant stenosis but the right external iliac artery had a short segment occlusion.  The right common femoral artery was then heavily diseased with an 80% or greater  stenosis.             Right Lower Extremity:  This demonstrated 80 to 90% stenosis of the right common femoral artery spilling into the origin of the SFA.  The profunda femoris artery appeared to have some disease at its origin but was not as severe.  The remainder of the runoff was fairly sluggish and difficult to see, but there appeared to be a high-grade stenosis in the mid to distal SFA if not a short segment occlusion.  There did appear to be two-vessel runoff distally although it was very faint due to the inflow disease.  Left Lower Extremity: This demonstrated 2 highly calcific lesions 1 in the proximal to mid common femoral artery 1 in the distal common femoral artery on the left both creating greater than 80% stenosis.  The SFA had disease at its origin but then was only mildly diseased in the mid and distal segment and the  popliteal artery appeared patent.  There was then three-vessel runoff distally.   Disposition: Patient was taken to the recovery room in stable condition having tolerated the procedure well.  Complications: None  Festus Barren 09/04/2022 4:57 PM   This note was created with Dragon Medical transcription system. Any errors in dictation are purely unintentional.

## 2022-09-05 ENCOUNTER — Encounter: Payer: Self-pay | Admitting: Vascular Surgery

## 2022-09-05 DIAGNOSIS — I152 Hypertension secondary to endocrine disorders: Secondary | ICD-10-CM | POA: Diagnosis not present

## 2022-09-05 DIAGNOSIS — I998 Other disorder of circulatory system: Secondary | ICD-10-CM | POA: Diagnosis not present

## 2022-09-05 DIAGNOSIS — E1159 Type 2 diabetes mellitus with other circulatory complications: Secondary | ICD-10-CM | POA: Diagnosis not present

## 2022-09-05 DIAGNOSIS — E871 Hypo-osmolality and hyponatremia: Secondary | ICD-10-CM | POA: Insufficient documentation

## 2022-09-05 DIAGNOSIS — Z789 Other specified health status: Secondary | ICD-10-CM

## 2022-09-05 LAB — CBC
HCT: 41.1 % (ref 39.0–52.0)
Hemoglobin: 13.8 g/dL (ref 13.0–17.0)
MCH: 29.9 pg (ref 26.0–34.0)
MCHC: 33.6 g/dL (ref 30.0–36.0)
MCV: 89.2 fL (ref 80.0–100.0)
Platelets: 259 K/uL (ref 150–400)
RBC: 4.61 MIL/uL (ref 4.22–5.81)
RDW: 12.6 % (ref 11.5–15.5)
WBC: 8.2 K/uL (ref 4.0–10.5)
nRBC: 0 % (ref 0.0–0.2)

## 2022-09-05 LAB — BASIC METABOLIC PANEL WITH GFR
Anion gap: 9 (ref 5–15)
BUN: 37 mg/dL — ABNORMAL HIGH (ref 8–23)
CO2: 24 mmol/L (ref 22–32)
Calcium: 9.2 mg/dL (ref 8.9–10.3)
Chloride: 101 mmol/L (ref 98–111)
Creatinine, Ser: 1.61 mg/dL — ABNORMAL HIGH (ref 0.61–1.24)
GFR, Estimated: 44 mL/min — ABNORMAL LOW
Glucose, Bld: 141 mg/dL — ABNORMAL HIGH (ref 70–99)
Potassium: 4.1 mmol/L (ref 3.5–5.1)
Sodium: 134 mmol/L — ABNORMAL LOW (ref 135–145)

## 2022-09-05 LAB — HEPARIN LEVEL (UNFRACTIONATED): Heparin Unfractionated: 0.68 [IU]/mL (ref 0.30–0.70)

## 2022-09-05 LAB — PHOSPHORUS: Phosphorus: 3.9 mg/dL (ref 2.5–4.6)

## 2022-09-05 LAB — MAGNESIUM: Magnesium: 2.1 mg/dL (ref 1.7–2.4)

## 2022-09-05 NOTE — Consult Note (Signed)
ANTICOAGULATION CONSULT NOTE  Pharmacy Consult for Heparin Infusion Indication: Vascular Occlusion  Allergies  Allergen Reactions   Amlodipine Itching    Patient Measurements: Height: 5\' 9"  (175.3 cm) Weight: 85.3 kg (188 lb) IBW/kg (Calculated) : 70.7 kg Heparin Dosing Weight: 85.3 kg  Vital Signs: Temp: 98.8 F (37.1 C) (05/02 2356) Temp Source: Oral (05/02 2356) BP: 131/72 (05/02 2356) Pulse Rate: 67 (05/02 2356)  Labs: Recent Labs    09/03/22 1610 09/04/22 0405 09/04/22 1200 09/05/22 0102  HGB 15.8 13.7  --  13.8  HCT 47.1 41.0  --  41.1  PLT 315 267  --  259  APTT 30  --   --   --   LABPROT 13.3  --   --   --   INR 1.0  --   --   --   HEPARINUNFRC  --  0.34 0.55 0.68  CREATININE 1.49* 1.54*  --  1.61*     Estimated Creatinine Clearance: 41.6 mL/min (A) (by C-G formula based on SCr of 1.61 mg/dL (H)).   Medical History: Past Medical History:  Diagnosis Date   Diabetes mellitus without complication (HCC)    borderline   Hepatomegaly    Hyperlipidemia    Hypertension    Substance abuse (HCC)     Medications:  Scheduled:   aspirin EC  81 mg Oral Daily   atorvastatin  20 mg Oral Daily   carvedilol  3.125 mg Oral BID   folic acid  1 mg Oral Daily   irbesartan  300 mg Oral Daily   multivitamin with minerals  1 tablet Oral Daily   terazosin  2 mg Oral QHS   thiamine  100 mg Oral Daily   Or   thiamine  100 mg Intravenous Daily   Infusions:   heparin 1,350 Units/hr (09/04/22 1803)   PRN:   Assessment: William Ball is a 77 y.o. male presenting with concern for peripheral arterial occlusion. PMH significant for HLD, T2DM, CKD3, HTN, GERD, BPH, PAD. Patient reports increased redness swelling and pain of the right lower leg for the last few days. He had ultrasound on 4/4 which was negative for DVT. Patient was not on [facility] PTA per chart review. Pharmacy has been consulted to initiate and manage heparin infusion.   Baseline Labs: baseline aPTT, PT/INR  and CBC ordered  Goal of Therapy:  Heparin level 0.3-0.7 units/ml Monitor platelets by anticoagulation protocol: Yes  05/02 0405 HL 0.34, therapeutic x 1 05/02 1200 HL 0.55 05/03 0102 HL 0.68   Plan:  Heparin remains therapeutic  Continue heparin infusion at 1350 units/hr Recheck HL with AM labs Continue to monitor H&H and platelets daily while on heparin infusion   Otelia Sergeant, PharmD, [facility] 09/05/2022 1:28 AM

## 2022-09-05 NOTE — Progress Notes (Signed)
Progress Note    09/05/2022 3:45 PM 1 Day Post-Op  Subjective:   William Ball is a 77 year old male with history of PAD, hypertension, hyperlipidemia, non-insulin-dependent diabetes mellitus, who presents to emergency department for chief concerns of right limb pain. Patient is placed on heparin drip for leg ischemia.  Patient is now postop day 1 from an aortogram and selective bilateral lower extremity angiograms.  On exam patient is resting comfortably.  Patient states that he still has some discomfort in his right lower extremity.  Patient's right lower extremity is cooler than his left lower extremity.  Unable to palpate pulses in the patient's right lower extremity due to edema.  No complaints overnight.  Vitals all remained stable.   Vitals:   09/05/22 0537 09/05/22 0806  BP: 132/60 (!) 156/61  Pulse: (!) 58 (!) 58  Resp: 20 18  Temp: 98 F (36.7 C) 99.2 F (37.3 C)  SpO2: 95% 92%   Physical Exam: Cardiac:  RRR, Normal S1, S2 Lungs: Auscultation throughout, normal respiratory effort Incisions: Left groin incision with dressing clean dry and intact.  No hematoma seroma to note. Extremities:  Palpable pulses to left lower extremity, Unable to palpate PT/DP pulses on the right due to +2 edema.  Abdomen:  Positive bowel sounds, soft, NT/ND, no masses  Neurologic: A&O X 3; No focal weakness or paresthesias are detected; speech is fluent/normal   CBC    Component Value Date/Time   WBC 8.2 09/05/2022 0102   RBC 4.61 09/05/2022 0102   HGB 13.8 09/05/2022 0102   HGB 14.0 06/13/2019 1612   HCT 41.1 09/05/2022 0102   HCT 41.5 06/13/2019 1612   PLT 259 09/05/2022 0102   PLT 258 06/13/2019 1612   MCV 89.2 09/05/2022 0102   MCV 92 06/13/2019 1612   MCH 29.9 09/05/2022 0102   MCHC 33.6 09/05/2022 0102   RDW 12.6 09/05/2022 0102   RDW 12.8 06/13/2019 1612   LYMPHSABS 3.7 09/03/2022 1610   LYMPHSABS 2.1 06/13/2019 1612   MONOABS 0.8 09/03/2022 1610   EOSABS 0.8 (H)  09/03/2022 1610   EOSABS 0.6 (H) 06/13/2019 1612   BASOSABS 0.1 09/03/2022 1610   BASOSABS 0.1 06/13/2019 1612    BMET    Component Value Date/Time   NA 134 (L) 09/05/2022 0102   NA 134 06/18/2020 1556   K 4.1 09/05/2022 0102   CL 101 09/05/2022 0102   CO2 24 09/05/2022 0102   GLUCOSE 141 (H) 09/05/2022 0102   BUN 37 (H) 09/05/2022 0102   BUN 21 06/18/2020 1556   CREATININE 1.61 (H) 09/05/2022 0102   CALCIUM 9.2 09/05/2022 0102   GFRNONAA 44 (L) 09/05/2022 0102   GFRAA 66 06/18/2020 1556    INR    Component Value Date/Time   INR 1.0 09/03/2022 1610     Intake/Output Summary (Last 24 hours) at 09/05/2022 1545 Last data filed at 09/05/2022 1417 Gross per 24 hour  Intake 1333.92 ml  Output 1050 ml  Net 283.92 ml     Assessment/Plan:  77 y.o. male is s/p aortogram and selective bilateral lower extremity angiograms. 1 Day Post-Op   PLAN: Vascular surgery plans on taking the patient to the operating room on Wednesday, 09/10/2022 for bilateral femoral endarterectomies with bilateral iliac stent placement and possible right SFA stent placement.  I discussed in detail with the patient the surgical procedure, benefits, risks, and complications.  Patient verbalizes understanding.  Answered all the patient's questions.  Patient consents to proceed with the procedure  next week.  Patient will be made n.p.o. on midnight prior to surgery.  DVT prophylaxis:  ASA 81 mg daily and Heparin Infusion    [facility] 09/05/2022 3:45 PM

## 2022-09-05 NOTE — Care Management Important Message (Signed)
Important Message  Patient Details  Name: William Ball MRN: [id number] Date of Birth: 02-23-1946   Medicare Important Message Given:  Yes     Johnell Comings 09/05/2022, 12:09 PM

## 2022-09-05 NOTE — Progress Notes (Signed)
  Progress Note   Patient: William Ball ZOX:[id number] DOB: 1946/04/27 DOA: 09/03/2022     2 DOS: the patient was seen and examined on 09/05/2022   Brief hospital course:  [name] is a 77 year old male with history of PAD, hypertension, hyperlipidemia, non-insulin-dependent diabetes mellitus, who presents to emergency department for chief concerns of right limb pain. Patient is placed on heparin drip for leg ischemia, seen by vascular surgery, planning for intervention.    Principal Problem:   Acute lower limb ischemia Active Problems:   Hyperlipidemia   Gastroesophageal reflux   BPH (benign prostatic hyperplasia)   Smoking greater than 40 pack years   Aortic atherosclerosis (HCC)   Arterial insufficiency of lower extremity (HCC)   Hypertension associated with diabetes (HCC)   Right leg pain   Alcohol use   PVD (peripheral vascular disease) (HCC)   Hyponatremia   Assessment and Plan:  Acute lower limb ischemia Peripheral arterial disease. Tobacco abuse. Patient is status post lower extremity angiogram, definitive surgery is planned. Patient still on heparin drip, will continue. Patient advised to quit smoking.   Alcohol use No evidence of withdrawal, continue monitor electrolytes and magnesium.   Hypertension associated with diabetes (HCC) Continue ARB and beta-blocker   Aortic atherosclerosis (HCC) Aspirin 81 mg daily resumed, atorvastatin 20 mg daily resumed Pletal not resumed on admission as patient's current heparin GGT   BPH (benign prostatic hyperplasia) Terazosin 2 mg nightly   Hyperlipidemia Atorvastatin 20 mg daily resumed        Subjective:  Patient still having intermittent leg pain.  Physical Exam: Vitals:   09/04/22 2135 09/04/22 2356 09/05/22 0537 09/05/22 0806  BP: (!) 159/68 131/72 132/60 (!) 156/61  Pulse: (!) 58 67 (!) 58 (!) 58  Resp:  20 20 18   Temp: 97.9 F (36.6 C) 98.8 F (37.1 C) 98 F (36.7 C) 99.2 F (37.3 C)   TempSrc: Oral Oral Oral Oral  SpO2: 96% 94% 95% 92%  Weight:      Height:       General exam: Appears calm and comfortable  Respiratory system: Clear to auscultation. Respiratory effort normal. Cardiovascular system: S1 & S2 heard, RRR. No JVD, murmurs, rubs, gallops or clicks. No pedal edema. Gastrointestinal system: Abdomen is nondistended, soft and nontender. No organomegaly or masses felt. Normal bowel sounds heard. Central nervous system: Alert and oriented. No focal neurological deficits. Extremities: Right leg with decreased pedal pulse. Skin: No rashes, lesions or ulcers Psychiatry: Judgement and insight appear normal. Mood & affect appropriate.    Data Reviewed:  Lab results reviewed.  Family Communication: None  Disposition: Status is: Inpatient Remains inpatient appropriate because: Severity of disease, IV treatment, inpatient surgery pending.     Time spent: 35 minutes  Author: Marrion Coy, MD 09/05/2022 1:22 PM  For on call review [email or web address].

## 2022-09-06 DIAGNOSIS — I998 Other disorder of circulatory system: Secondary | ICD-10-CM | POA: Diagnosis not present

## 2022-09-06 DIAGNOSIS — E663 Overweight: Secondary | ICD-10-CM | POA: Insufficient documentation

## 2022-09-06 DIAGNOSIS — Z789 Other specified health status: Secondary | ICD-10-CM | POA: Diagnosis not present

## 2022-09-06 DIAGNOSIS — I739 Peripheral vascular disease, unspecified: Secondary | ICD-10-CM | POA: Diagnosis not present

## 2022-09-06 LAB — BASIC METABOLIC PANEL WITH GFR
Anion gap: 5 (ref 5–15)
BUN: 39 mg/dL — ABNORMAL HIGH (ref 8–23)
CO2: 25 mmol/L (ref 22–32)
Calcium: 9 mg/dL (ref 8.9–10.3)
Chloride: 101 mmol/L (ref 98–111)
Creatinine, Ser: 1.45 mg/dL — ABNORMAL HIGH (ref 0.61–1.24)
GFR, Estimated: 50 mL/min — ABNORMAL LOW
Glucose, Bld: 132 mg/dL — ABNORMAL HIGH (ref 70–99)
Potassium: 4.1 mmol/L (ref 3.5–5.1)
Sodium: 131 mmol/L — ABNORMAL LOW (ref 135–145)

## 2022-09-06 LAB — CBC
HCT: 40.8 % (ref 39.0–52.0)
Hemoglobin: 13.6 g/dL (ref 13.0–17.0)
MCH: 29.8 pg (ref 26.0–34.0)
MCHC: 33.3 g/dL (ref 30.0–36.0)
MCV: 89.5 fL (ref 80.0–100.0)
Platelets: 255 K/uL (ref 150–400)
RBC: 4.56 MIL/uL (ref 4.22–5.81)
RDW: 12.2 % (ref 11.5–15.5)
WBC: 7.8 K/uL (ref 4.0–10.5)
nRBC: 0 % (ref 0.0–0.2)

## 2022-09-06 LAB — MAGNESIUM: Magnesium: 2 mg/dL (ref 1.7–2.4)

## 2022-09-06 LAB — HEPARIN LEVEL (UNFRACTIONATED): Heparin Unfractionated: 0.59 [IU]/mL (ref 0.30–0.70)

## 2022-09-06 MED ORDER — MELATONIN 5 MG PO TABS
5.0000 mg | ORAL_TABLET | Freq: Every day | ORAL | Status: DC
  Administered 2022-09-06 – 2022-09-14 (×9): 5 mg via ORAL
  Filled 2022-09-06 (×9): qty 1

## 2022-09-06 NOTE — Consult Note (Signed)
ANTICOAGULATION CONSULT NOTE  Pharmacy Consult for Heparin Infusion Indication: Vascular Occlusion  Allergies  Allergen Reactions   Amlodipine Itching    Patient Measurements: Height: 5\' 9"  (175.3 cm) Weight: 85.3 kg (188 lb) IBW/kg (Calculated) : 70.7 kg Heparin Dosing Weight: 85.3 kg  Vital Signs: Temp: 97.9 F (36.6 C) (05/04 0411) Temp Source: Oral (05/04 0411) BP: 118/45 (05/04 0411) Pulse Rate: 58 (05/04 0411)  Labs: Recent Labs    09/03/22 1610 09/03/22 1610 09/04/22 0405 09/04/22 1200 09/05/22 0102 09/06/22 0356  HGB 15.8  --  13.7  --  13.8 13.6  HCT 47.1  --  41.0  --  41.1 40.8  PLT 315  --  267  --  259 255  APTT 30  --   --   --   --   --   LABPROT 13.3  --   --   --   --   --   INR 1.0  --   --   --   --   --   HEPARINUNFRC  --    < > 0.34 0.55 0.68 0.59  CREATININE 1.49*  --  1.54*  --  1.61* 1.45*   < > = values in this interval not displayed.     Estimated Creatinine Clearance: 46.2 mL/min (A) (by C-G formula based on SCr of 1.45 mg/dL (H)).   Medical History: Past Medical History:  Diagnosis Date   Diabetes mellitus without complication (HCC)    borderline   Hepatomegaly    Hyperlipidemia    Hypertension    Substance abuse (HCC)     Medications:  Scheduled:   aspirin EC  81 mg Oral Daily   atorvastatin  20 mg Oral Daily   carvedilol  3.125 mg Oral BID   folic acid  1 mg Oral Daily   irbesartan  300 mg Oral Daily   multivitamin with minerals  1 tablet Oral Daily   terazosin  2 mg Oral QHS   thiamine  100 mg Oral Daily   Or   thiamine  100 mg Intravenous Daily   Infusions:   heparin 1,350 Units/hr (09/06/22 0224)   PRN:   Assessment: William Ball is a 77 y.o. male presenting with concern for peripheral arterial occlusion. PMH significant for HLD, T2DM, CKD3, HTN, GERD, BPH, PAD. Patient reports increased redness swelling and pain of the right lower leg for the last few days. He had ultrasound on 4/4 which was negative for  DVT. Patient was not on [facility] PTA per chart review. Pharmacy has been consulted to initiate and manage heparin infusion.   Baseline Labs: baseline aPTT, PT/INR and CBC ordered  Goal of Therapy:  Heparin level 0.3-0.7 units/ml Monitor platelets by anticoagulation protocol: Yes  05/02 0405 HL 0.34, therapeutic x 1 05/02 1200 HL 0.55 05/03 0102 HL 0.68 05/04 0356 HL 0.59   Plan:  Heparin remains therapeutic  Continue heparin infusion at 1350 units/hr Recheck HL with AM labs Continue to monitor H&H and platelets daily while on heparin infusion   Otelia Sergeant, PharmD, [facility] 09/06/2022 5:03 AM

## 2022-09-06 NOTE — Progress Notes (Signed)
  Progress Note   Patient: William Ball:[id number] DOB: 1945-09-17 DOA: 09/03/2022     3 DOS: the patient was seen and examined on 09/06/2022   Brief hospital course:  [name] is a 77 year old male with history of PAD, hypertension, hyperlipidemia, non-insulin-dependent diabetes mellitus, who presents to emergency department for chief concerns of right limb pain. Patient is placed on heparin drip for leg ischemia, seen by vascular surgery, planning for intervention.    Principal Problem:   Acute lower limb ischemia Active Problems:   Hyperlipidemia   Gastroesophageal reflux   BPH (benign prostatic hyperplasia)   Smoking greater than 40 pack years   Aortic atherosclerosis (HCC)   Arterial insufficiency of lower extremity (HCC)   Hypertension associated with diabetes (HCC)   Right leg pain   Alcohol use   PVD (peripheral vascular disease) (HCC)   Hyponatremia   Assessment and Plan: Acute lower limb ischemia Peripheral arterial disease. Tobacco abuse. Patient is status post lower extremity angiogram, definitive surgery is planned. Patient still on heparin drip, will continue. Patient advised to quit smoking. Angioplasty is scheduled on Wednesday next week.  Will continue keeping in the hospital until surgery. Continue symptomatic treatment with IV and oral pain medicine.  Alcohol use No evidence of withdrawal.  Continue to follow.   Hypertension associated with diabetes (HCC) Continue ARB and beta-blocker   Aortic atherosclerosis (HCC) Aspirin 81 mg daily resumed, atorvastatin 20 mg daily resumed Pletal not resumed on admission as patient's current heparin GGT   BPH (benign prostatic hyperplasia) Terazosin 2 mg nightly   Hyperlipidemia Atorvastatin 20 mg daily resumed      Subjective:  Patient still complaining of pain in the leg, no shortness of breath. Could not sleep well at night.  Physical Exam: Vitals:   09/05/22 0806 09/05/22 2012 09/06/22  0411 09/06/22 0801  BP: (!) 156/61 (!) 159/66 (!) 118/45 (!) 145/62  Pulse: (!) 58 (!) 59 (!) 58 (!) 53  Resp: 18 18 18 17   Temp: 99.2 F (37.3 C) 98.3 F (36.8 C) 97.9 F (36.6 C) 98.6 F (37 C)  TempSrc: Oral Oral Oral   SpO2: 92% 95% 96% 94%  Weight:      Height:       General exam: Appears calm and comfortable  Respiratory system: Clear to auscultation. Respiratory effort normal. Cardiovascular system: S1 & S2 heard, RRR. No JVD, murmurs, rubs, gallops or clicks. No pedal edema. Gastrointestinal system: Abdomen is nondistended, soft and nontender. No organomegaly or masses felt. Normal bowel sounds heard. Central nervous system: Alert and oriented. No focal neurological deficits. Extremities: Symmetric 5 x 5 power. Skin: No rashes, lesions or ulcers Psychiatry: Judgement and insight appear normal. Mood & affect appropriate.    Data Reviewed:  Lab results reviewed.  Family Communication: unable to reach wife  Disposition: Status is: Inpatient Remains inpatient appropriate because: Severity of disease, IV treatment.     Time spent: 35 minutes  Author: Marrion Coy, MD 09/06/2022 11:51 AM  For on call review [email or web address].

## 2022-09-07 DIAGNOSIS — I739 Peripheral vascular disease, unspecified: Secondary | ICD-10-CM | POA: Diagnosis not present

## 2022-09-07 DIAGNOSIS — I998 Other disorder of circulatory system: Secondary | ICD-10-CM | POA: Diagnosis not present

## 2022-09-07 LAB — CBC
HCT: 41.8 % (ref 39.0–52.0)
Hemoglobin: 13.9 g/dL (ref 13.0–17.0)
MCH: 29.8 pg (ref 26.0–34.0)
MCHC: 33.3 g/dL (ref 30.0–36.0)
MCV: 89.5 fL (ref 80.0–100.0)
Platelets: 259 K/uL (ref 150–400)
RBC: 4.67 MIL/uL (ref 4.22–5.81)
RDW: 12.4 % (ref 11.5–15.5)
WBC: 6.6 K/uL (ref 4.0–10.5)
nRBC: 0 % (ref 0.0–0.2)

## 2022-09-07 LAB — BASIC METABOLIC PANEL WITH GFR
Anion gap: 8 (ref 5–15)
BUN: 39 mg/dL — ABNORMAL HIGH (ref 8–23)
CO2: 22 mmol/L (ref 22–32)
Calcium: 9.3 mg/dL (ref 8.9–10.3)
Chloride: 103 mmol/L (ref 98–111)
Creatinine, Ser: 1.38 mg/dL — ABNORMAL HIGH (ref 0.61–1.24)
GFR, Estimated: 53 mL/min — ABNORMAL LOW
Glucose, Bld: 124 mg/dL — ABNORMAL HIGH (ref 70–99)
Potassium: 4 mmol/L (ref 3.5–5.1)
Sodium: 133 mmol/L — ABNORMAL LOW (ref 135–145)

## 2022-09-07 LAB — HEPARIN LEVEL (UNFRACTIONATED): Heparin Unfractionated: 0.67 [IU]/mL (ref 0.30–0.70)

## 2022-09-07 MED ORDER — LACTULOSE 10 GM/15ML PO SOLN
20.0000 g | Freq: Once | ORAL | Status: AC
  Administered 2022-09-07: 20 g via ORAL
  Filled 2022-09-07: qty 30

## 2022-09-07 MED ORDER — SENNOSIDES-DOCUSATE SODIUM 8.6-50 MG PO TABS
2.0000 | ORAL_TABLET | Freq: Two times a day (BID) | ORAL | Status: DC
  Administered 2022-09-07 – 2022-09-15 (×12): 2 via ORAL
  Filled 2022-09-07 (×16): qty 2

## 2022-09-07 NOTE — Progress Notes (Signed)
  Progress Note   Patient: William Ball:[id number] DOB: [birth year] DOA: 09/03/2022     4 DOS: the patient was seen and examined on 09/07/2022   Brief hospital course:  [name] is a 77 year old male with history of PAD, hypertension, hyperlipidemia, non-insulin-dependent diabetes mellitus, who presents to emergency department for chief concerns of right limb pain. Patient is placed on heparin drip for leg ischemia, seen by vascular surgery, planning for intervention.    Principal Problem:   Acute lower limb ischemia Active Problems:   Hyperlipidemia   Gastroesophageal reflux   BPH (benign prostatic hyperplasia)   Smoking greater than 40 pack years   Aortic atherosclerosis (HCC)   Arterial insufficiency of lower extremity (HCC)   Hypertension associated with diabetes (HCC)   Right leg pain   Alcohol use   PVD (peripheral vascular disease) (HCC)   Hyponatremia   Overweight (BMI 25.0-29.9)   Assessment and Plan: Acute lower limb ischemia Peripheral arterial disease. Tobacco abuse. Patient is status post lower extremity angiogram, definitive surgery is planned. Patient still on heparin drip, will continue. Patient advised to quit smoking. Angioplasty is scheduled on Wednesday next week.  Will continue keeping in the hospital until surgery. Continue heparin drip, continue pain medicine.  Constipation from pain medicine. Start scheduled senna, also added lactulose x 1.   Alcohol use No evidence of withdrawal.  Continue to follow.   Hypertension associated with diabetes (HCC) Continue ARB and beta-blocker   Aortic atherosclerosis (HCC) Aspirin 81 mg daily resumed, atorvastatin 20 mg daily resumed Pletal not resumed on admission as patient's current heparin GGT   BPH (benign prostatic hyperplasia) Terazosin 2 mg nightly   Hyperlipidemia Atorvastatin 20 mg daily resumed        Subjective:  Patient still complaining of pain in his legs, no shortness of  breath or confusion.  Physical Exam: Vitals:   09/06/22 1700 09/06/22 2011 09/07/22 0455 09/07/22 0838  BP: (!) 159/60 (!) 153/55 (!) 135/58 109/75  Pulse:  (!) 54 (!) 50 (!) 57  Resp:  18 18 18   Temp:  98.2 F (36.8 C) 97.9 F (36.6 C) 97.8 F (36.6 C)  TempSrc:    Oral  SpO2:  96% 94% 97%  Weight:      Height:       General exam: Appears calm and comfortable  Respiratory system: Clear to auscultation. Respiratory effort normal. Cardiovascular system: S1 & S2 heard, RRR. No JVD, murmurs, rubs, gallops or clicks. No pedal edema. Gastrointestinal system: Abdomen is nondistended, soft and nontender. No organomegaly or masses felt. Normal bowel sounds heard. Central nervous system: Alert and oriented. No focal neurological deficits. Extremities: Symmetric 5 x 5 power. Skin: No rashes, lesions or ulcers Psychiatry: Judgement and insight appear normal. Mood & affect appropriate.    Data Reviewed:  Lab results reviewed.  Family Communication: Not able to reach wife.  Disposition: Status is: Inpatient Remains inpatient appropriate because: Severity of disease, IV treatment, inpatient procedure.     Time spent: 35 minutes  Author: Marrion Coy, MD 09/07/2022 9:56 AM  For on call review [email or web address].

## 2022-09-07 NOTE — Progress Notes (Signed)
PT Cancellation Note  Patient Details Name: William Ball MRN: [id number] DOB: [birth year]   Cancelled Treatment:    Reason Eval/Treat Not Completed: PT screened, no needs identified, will sign off. Pt received comfortable in bed with constant pain in LE. PT spent time discussing pt's condition and functional status with pt and pt's nurse and found pt is ambulatory in room pushing IV pole  under nursing supervision. Pt has pain in LE due to vascular insufficiency for which he is scheduled to under go procedure this 09/10/2022. PT advised pt to sit in the recliner to minimize pain which pt agreed with. PT plans to evaluate the pt after the procedure with new MD orders. Pt agreed. MD made aware of it.   Janet Berlin PT DPT 2:41 PM,09/07/22

## 2022-09-07 NOTE — Consult Note (Signed)
ANTICOAGULATION CONSULT NOTE  Pharmacy Consult for Heparin Infusion Indication: Vascular Occlusion  Allergies  Allergen Reactions   Amlodipine Itching    Patient Measurements: Height: 5\' 9"  (175.3 cm) Weight: 85.3 kg (188 lb) IBW/kg (Calculated) : 70.7 kg Heparin Dosing Weight: 85.3 kg  Vital Signs: Temp: 97.9 F (36.6 C) (05/05 0455) BP: 135/58 (05/05 0455) Pulse Rate: 50 (05/05 0455)  Labs: Recent Labs    09/05/22 0102 09/06/22 0356 09/07/22 0433  HGB 13.8 13.6 13.9  HCT 41.1 40.8 41.8  PLT 259 255 259  HEPARINUNFRC 0.68 0.59 0.67  CREATININE 1.61* 1.45* 1.38*     Estimated Creatinine Clearance: 48.5 mL/min (A) (by C-G formula based on SCr of 1.38 mg/dL (H)).   Medical History: Past Medical History:  Diagnosis Date   Diabetes mellitus without complication (HCC)    borderline   Hepatomegaly    Hyperlipidemia    Hypertension    Substance abuse (HCC)     Medications:  Scheduled:   aspirin EC  81 mg Oral Daily   atorvastatin  20 mg Oral Daily   carvedilol  3.125 mg Oral BID   folic acid  1 mg Oral Daily   irbesartan  300 mg Oral Daily   melatonin  5 mg Oral QHS   multivitamin with minerals  1 tablet Oral Daily   terazosin  2 mg Oral QHS   thiamine  100 mg Oral Daily   Or   thiamine  100 mg Intravenous Daily   Infusions:   heparin 1,350 Units/hr (09/07/22 0409)   PRN:   Assessment: William Ball is a 77 y.o. male presenting with concern for peripheral arterial occlusion. PMH significant for HLD, T2DM, CKD3, HTN, GERD, BPH, PAD. Patient reports increased redness swelling and pain of the right lower leg for the last few days. He had ultrasound on 4/4 which was negative for DVT. Patient was not on [facility] PTA per chart review. Pharmacy has been consulted to initiate and manage heparin infusion.   Baseline Labs: baseline aPTT, PT/INR and CBC ordered  Goal of Therapy:  Heparin level 0.3-0.7 units/ml Monitor platelets by anticoagulation protocol:  Yes  05/02 0405 HL 0.34, therapeutic x 1 05/02 1200 HL 0.55 05/03 0102 HL 0.68 05/04 0356 HL 0.59 05/05 0433 HL 0.67   Plan:  Heparin remains therapeutic  Continue heparin infusion at 1350 units/hr Recheck HL with AM labs Continue to monitor H&H and platelets daily while on heparin infusion   Otelia Sergeant, PharmD, [facility] 09/07/2022 5:59 AM

## 2022-09-08 DIAGNOSIS — T82856A Stenosis of peripheral vascular stent, initial encounter: Secondary | ICD-10-CM | POA: Diagnosis not present

## 2022-09-08 DIAGNOSIS — I998 Other disorder of circulatory system: Secondary | ICD-10-CM | POA: Diagnosis not present

## 2022-09-08 DIAGNOSIS — I700 Atherosclerosis of aorta: Secondary | ICD-10-CM | POA: Diagnosis not present

## 2022-09-08 DIAGNOSIS — Z9889 Other specified postprocedural states: Secondary | ICD-10-CM

## 2022-09-08 DIAGNOSIS — I739 Peripheral vascular disease, unspecified: Secondary | ICD-10-CM | POA: Diagnosis not present

## 2022-09-08 DIAGNOSIS — I152 Hypertension secondary to endocrine disorders: Secondary | ICD-10-CM | POA: Diagnosis not present

## 2022-09-08 DIAGNOSIS — E1159 Type 2 diabetes mellitus with other circulatory complications: Secondary | ICD-10-CM | POA: Diagnosis not present

## 2022-09-08 DIAGNOSIS — I70212 Atherosclerosis of native arteries of extremities with intermittent claudication, left leg: Secondary | ICD-10-CM | POA: Diagnosis not present

## 2022-09-08 DIAGNOSIS — I70221 Atherosclerosis of native arteries of extremities with rest pain, right leg: Secondary | ICD-10-CM | POA: Diagnosis not present

## 2022-09-08 LAB — HEMOGLOBIN A1C
Hgb A1c MFr Bld: 8 % — ABNORMAL HIGH (ref 4.8–5.6)
Mean Plasma Glucose: 182.9 mg/dL

## 2022-09-08 LAB — CBC
HCT: 41.8 % (ref 39.0–52.0)
Hemoglobin: 14 g/dL (ref 13.0–17.0)
MCH: 30 pg (ref 26.0–34.0)
MCHC: 33.5 g/dL (ref 30.0–36.0)
MCV: 89.5 fL (ref 80.0–100.0)
Platelets: 253 K/uL (ref 150–400)
RBC: 4.67 MIL/uL (ref 4.22–5.81)
RDW: 12.5 % (ref 11.5–15.5)
WBC: 7.6 K/uL (ref 4.0–10.5)
nRBC: 0 % (ref 0.0–0.2)

## 2022-09-08 LAB — GLUCOSE, CAPILLARY
Glucose-Capillary: 148 mg/dL — ABNORMAL HIGH (ref 70–99)
Glucose-Capillary: 172 mg/dL — ABNORMAL HIGH (ref 70–99)
Glucose-Capillary: 91 mg/dL (ref 70–99)

## 2022-09-08 LAB — HEPARIN LEVEL (UNFRACTIONATED): Heparin Unfractionated: 0.56 [IU]/mL (ref 0.30–0.70)

## 2022-09-08 MED ORDER — INSULIN ASPART 100 UNIT/ML IJ SOLN
0.0000 [IU] | Freq: Three times a day (TID) | INTRAMUSCULAR | Status: DC
  Administered 2022-09-08: 2 [IU] via SUBCUTANEOUS
  Administered 2022-09-08: 1 [IU] via SUBCUTANEOUS
  Administered 2022-09-09 – 2022-09-12 (×7): 2 [IU] via SUBCUTANEOUS
  Administered 2022-09-13: 1 [IU] via SUBCUTANEOUS
  Administered 2022-09-13: 2 [IU] via SUBCUTANEOUS
  Administered 2022-09-14 (×3): 1 [IU] via SUBCUTANEOUS
  Administered 2022-09-15: 3 [IU] via SUBCUTANEOUS
  Filled 2022-09-08 (×15): qty 1

## 2022-09-08 NOTE — Progress Notes (Signed)
William Ball and Vascular Surgery  Daily Progress Note   Subjective  -   Pain is better on a heparin drip and being minimally ambulatory.  No major events over the weekend.  Objective Vitals:   09/07/22 1525 09/07/22 2029 09/08/22 0333 09/08/22 0802  BP: (!) 143/59 (!) 162/61 (!) 110/58 133/60  Pulse: (!) 53 70 60 (!) 57  Resp: 18 18 16 18   Temp: 98.8 F (37.1 C) 98.4 F (36.9 C) 97.8 F (36.6 C) 98.2 F (36.8 C)  TempSrc:  Oral Oral   SpO2: 96% 97% 97% 95%  Weight:      Height:        Intake/Output Summary (Last 24 hours) at 09/08/2022 1150 Last data filed at 09/08/2022 1031 Gross per 24 hour  Intake 854.18 ml  Output 400 ml  Net 454.18 ml    PULM  CTAB CV  RRR VASC  nonpalpable pedal pulses in both lower extremities  Laboratory CBC    Component Value Date/Time   WBC 7.6 09/08/2022 0528   HGB 14.0 09/08/2022 0528   HGB 14.0 06/13/2019 1612   HCT 41.8 09/08/2022 0528   HCT 41.5 06/13/2019 1612   PLT 253 09/08/2022 0528   PLT 258 06/13/2019 1612    BMET    Component Value Date/Time   NA 133 (L) 09/07/2022 0433   NA 134 06/18/2020 1556   K 4.0 09/07/2022 0433   CL 103 09/07/2022 0433   CO2 22 09/07/2022 0433   GLUCOSE 124 (H) 09/07/2022 0433   BUN 39 (H) 09/07/2022 0433   BUN 21 06/18/2020 1556   CREATININE 1.38 (H) 09/07/2022 0433   CALCIUM 9.3 09/07/2022 0433   GFRNONAA 53 (L) 09/07/2022 0433   GFRAA 66 06/18/2020 1556    Assessment/Planning: POD #4 s/p BLE angiograms for rest pain  Needs bilateral femoral endarterectomies, bilateral iliac interventions, and likely a right SFA intervention. Patient is on the operating room schedule for Wednesday for above procedure Can continue heparin drip until Wednesday morning at 6 AM N.p.o. after midnight Tuesday night Risks and benefits were discussed with the patient in detail who is agreeable to proceed   Festus Barren  09/08/2022, 11:50 AM

## 2022-09-08 NOTE — Care Management Important Message (Signed)
Important Message  Patient Details  Name: William Ball MRN: [id number] Date of Birth: 10-Nov-1945   Medicare Important Message Given:  Yes     Johnell Comings 09/08/2022, 11:34 AM

## 2022-09-08 NOTE — Consult Note (Signed)
ANTICOAGULATION CONSULT NOTE  Pharmacy Consult for Heparin Infusion Indication: Vascular Occlusion  Allergies  Allergen Reactions   Amlodipine Itching    Patient Measurements: Height: 5\' 9"  (175.3 cm) Weight: 85.3 kg (188 lb) IBW/kg (Calculated) : 70.7 kg Heparin Dosing Weight: 85.3 kg  Vital Signs: Temp: 97.8 F (36.6 C) (05/06 0333) Temp Source: Oral (05/06 0333) BP: 110/58 (05/06 0333) Pulse Rate: 60 (05/06 0333)  Labs: Recent Labs    09/06/22 0356 09/07/22 0433 09/08/22 0528  HGB 13.6 13.9 14.0  HCT 40.8 41.8 41.8  PLT 255 259 253  HEPARINUNFRC 0.59 0.67  --   CREATININE 1.45* 1.38*  --      Estimated Creatinine Clearance: 48.5 mL/min (A) (by C-G formula based on SCr of 1.38 mg/dL (H)).   Medical History: Past Medical History:  Diagnosis Date   Diabetes mellitus without complication (HCC)    borderline   Hepatomegaly    Hyperlipidemia    Hypertension    Substance abuse (HCC)     Medications:  Scheduled:   aspirin EC  81 mg Oral Daily   atorvastatin  20 mg Oral Daily   carvedilol  3.125 mg Oral BID   folic acid  1 mg Oral Daily   irbesartan  300 mg Oral Daily   melatonin  5 mg Oral QHS   multivitamin with minerals  1 tablet Oral Daily   senna-docusate  2 tablet Oral BID   terazosin  2 mg Oral QHS   thiamine  100 mg Oral Daily   Or   thiamine  100 mg Intravenous Daily   Infusions:   heparin 1,350 Units/hr (09/08/22 0001)   PRN:   Assessment: William Ball is a 77 y.o. male presenting with concern for peripheral arterial occlusion. PMH significant for HLD, T2DM, CKD3, HTN, GERD, BPH, PAD. Patient reports increased redness swelling and pain of the right lower leg for the last few days. He had ultrasound on 4/4 which was negative for DVT. Patient was not on [facility] PTA per chart review. Pharmacy has been consulted to initiate and manage heparin infusion.   Baseline Labs: baseline aPTT, PT/INR and CBC ordered  Goal of Therapy:  Heparin level  0.3-0.7 units/ml Monitor platelets by anticoagulation protocol: Yes  05/02 0405 HL 0.34, therapeutic x 1 05/02 1200 HL 0.55 05/03 0102 HL 0.68 05/04 0356 HL 0.59 05/05 0433 HL 0.67 05/05 0528 HL 0.56, therapeutic @ 1350 un/hr   Plan:  Heparin remains therapeutic and CBC stable. Continue heparin infusion at 1350 units/hr Recheck HL with AM labs Continue to monitor H&H and platelets daily while on heparin infusion    Rae Plotner Rodriguez-Guzman PharmD, BCPS 09/08/2022 7:47 AM

## 2022-09-08 NOTE — Progress Notes (Signed)
  Progress Note   Patient: William Ball ZOX:[id number] DOB: 12/04/1945 DOA: 09/03/2022     5 DOS: the patient was seen and examined on 09/08/2022   Brief hospital course:  Mr. Grayling Yepes is a 77 year old male with history of PAD, hypertension, hyperlipidemia, non-insulin-dependent diabetes mellitus, who presents to emergency department for chief concerns of right limb pain. Patient is placed on heparin drip for leg ischemia, seen by vascular surgery, planning for intervention on 5/8    Principal Problem:   Acute lower limb ischemia Active Problems:   Hyperlipidemia   Gastroesophageal reflux   BPH (benign prostatic hyperplasia)   Smoking greater than 40 pack years   Aortic atherosclerosis (HCC)   Arterial insufficiency of lower extremity (HCC)   Hypertension associated with diabetes (HCC)   Right leg pain   Alcohol use   PVD (peripheral vascular disease) (HCC)   Hyponatremia   Overweight (BMI 25.0-29.9)   Assessment and Plan:  Acute lower limb ischemia Peripheral arterial disease. Tobacco abuse. Patient is status post lower extremity angiogram, definitive surgery is planned. Patient still on heparin drip, will continue. Patient advised to quit smoking. Angioplasty is scheduled on Wednesday.  Will continue keeping in the hospital until surgery. Patient doing well on heparin drip and the pain medicine.   Constipation from pain medicine. Had a bowel movement after lactulose.   Alcohol use No evidence of withdrawal.  Continue to follow.   Hypertension associated with diabetes (HCC) Continue ARB and beta-blocker   Aortic atherosclerosis (HCC) Aspirin 81 mg daily resumed, atorvastatin 20 mg daily resumed Pletal not resumed on admission as patient's current heparin GGT   BPH (benign prostatic hyperplasia) Terazosin 2 mg nightly   Hyperlipidemia Atorvastatin 20 mg daily resumed     Subjective:  Patient still complain intermittent leg pain otherwise doing  well.  Physical Exam: Vitals:   09/07/22 1525 09/07/22 2029 09/08/22 0333 09/08/22 0802  BP: (!) 143/59 (!) 162/61 (!) 110/58 133/60  Pulse: (!) 53 70 60 (!) 57  Resp: 18 18 16 18   Temp: 98.8 F (37.1 C) 98.4 F (36.9 C) 97.8 F (36.6 C) 98.2 F (36.8 C)  TempSrc:  Oral Oral   SpO2: 96% 97% 97% 95%  Weight:      Height:       General exam: Appears calm and comfortable  Respiratory system: Clear to auscultation. Respiratory effort normal. Cardiovascular system: S1 & S2 heard, RRR. No JVD, murmurs, rubs, gallops or clicks. No pedal edema. Gastrointestinal system: Abdomen is nondistended, soft and nontender. No organomegaly or masses felt. Normal bowel sounds heard. Central nervous system: Alert and oriented. No focal neurological deficits. Extremities: Symmetric 5 x 5 power. Skin: No rashes, lesions or ulcers Psychiatry: Judgement and insight appear normal. Mood & affect appropriate.    Data Reviewed:  Lab results reviewed.  Family Communication: None  Disposition: Status is: Inpatient Remains inpatient appropriate because: Severity of disease, inpatient procedure.     Time spent: 35 minutes  Author: Marrion Coy, MD 09/08/2022 12:22 PM  For on call review [email or web address].

## 2022-09-09 DIAGNOSIS — E1159 Type 2 diabetes mellitus with other circulatory complications: Secondary | ICD-10-CM | POA: Diagnosis not present

## 2022-09-09 DIAGNOSIS — I70421 Atherosclerosis of autologous vein bypass graft(s) of the extremities with rest pain, right leg: Secondary | ICD-10-CM

## 2022-09-09 DIAGNOSIS — I152 Hypertension secondary to endocrine disorders: Secondary | ICD-10-CM | POA: Diagnosis not present

## 2022-09-09 DIAGNOSIS — I998 Other disorder of circulatory system: Secondary | ICD-10-CM | POA: Diagnosis not present

## 2022-09-09 DIAGNOSIS — I739 Peripheral vascular disease, unspecified: Secondary | ICD-10-CM | POA: Diagnosis not present

## 2022-09-09 LAB — HEPARIN LEVEL (UNFRACTIONATED): Heparin Unfractionated: 0.66 [IU]/mL (ref 0.30–0.70)

## 2022-09-09 LAB — GLUCOSE, CAPILLARY
Glucose-Capillary: 104 mg/dL — ABNORMAL HIGH (ref 70–99)
Glucose-Capillary: 121 mg/dL — ABNORMAL HIGH (ref 70–99)
Glucose-Capillary: 178 mg/dL — ABNORMAL HIGH (ref 70–99)
Glucose-Capillary: 145 mg/dL — ABNORMAL HIGH (ref 70–99)

## 2022-09-09 MED ORDER — CHLORHEXIDINE GLUCONATE 4 % EX SOLN
60.0000 mL | Freq: Once | CUTANEOUS | Status: AC
  Administered 2022-09-09: 4 via TOPICAL

## 2022-09-09 MED ORDER — HEPARIN (PORCINE) 25000 UT/250ML-% IV SOLN
1350.0000 [IU]/h | INTRAVENOUS | Status: DC
  Administered 2022-09-09 – 2022-09-10 (×2): 1350 [IU]/h via INTRAVENOUS
  Filled 2022-09-09: qty 250

## 2022-09-09 MED ORDER — CHLORHEXIDINE GLUCONATE 4 % EX SOLN
60.0000 mL | Freq: Once | CUTANEOUS | Status: AC
  Administered 2022-09-10: 4 via TOPICAL

## 2022-09-09 MED ORDER — HEPARIN (PORCINE) 25000 UT/250ML-% IV SOLN: 1350.0000 [IU]/h | INTRAVENOUS | Status: DC

## 2022-09-09 NOTE — H&P (View-Only) (Signed)
Progress Note    09/09/2022 1:59 PM 5 Days Post-Op  Subjective:  William Ball is a 77 year old male with history of PAD, hypertension, hyperlipidemia, non-insulin-dependent diabetes mellitus, who presents to emergency department for chief concerns of right limb pain. Patient is placed on heparin drip for leg ischemia.  Patient is now postop day 2 from an aortogram and selective bilateral lower extremity angiograms.   On exam patient is resting comfortably. Patient states that he still has some discomfort in his right lower extremity. Patient's right lower extremity is cooler than his left lower extremity. Unable to palpate pulses in the patient's right lower extremity due to edema. No complaints overnight. Vitals all remained stable.   Vitals:   09/09/22 0300 09/09/22 0827  BP: (!) 141/60 (!) 148/57  Pulse: (!) 50 60  Resp: 18 18  Temp: 98.1 F (36.7 C) 97.6 F (36.4 C)  SpO2: 93% 97%   Physical Exam: Cardiac:  RRR, Normal S1, S2  Lungs:  Auscultation throughout, normal respiratory effort  Incisions:  Left groin incision with dressing clean dry and intact. No hematoma seroma to note.  Extremities:   Palpable pulses to left lower extremity, Unable to palpate PT/DP pulses on the right due to +2 edema.  Abdomen:  Positive bowel sounds, soft, NT/ND, no masses  Neurologic: A&O X 3; No focal weakness or paresthesias are detected; speech is fluent/normal.   CBC    Component Value Date/Time   WBC 7.6 09/08/2022 0528   RBC 4.67 09/08/2022 0528   HGB 14.0 09/08/2022 0528   HGB 14.0 06/13/2019 1612   HCT 41.8 09/08/2022 0528   HCT 41.5 06/13/2019 1612   PLT 253 09/08/2022 0528   PLT 258 06/13/2019 1612   MCV 89.5 09/08/2022 0528   MCV 92 06/13/2019 1612   MCH 30.0 09/08/2022 0528   MCHC 33.5 09/08/2022 0528   RDW 12.5 09/08/2022 0528   RDW 12.8 06/13/2019 1612   LYMPHSABS 3.7 09/03/2022 1610   LYMPHSABS 2.1 06/13/2019 1612   MONOABS 0.8 09/03/2022 1610   EOSABS 0.8 (H)  09/03/2022 1610   EOSABS 0.6 (H) 06/13/2019 1612   BASOSABS 0.1 09/03/2022 1610   BASOSABS 0.1 06/13/2019 1612    BMET    Component Value Date/Time   NA 133 (L) 09/07/2022 0433   NA 134 06/18/2020 1556   K 4.0 09/07/2022 0433   CL 103 09/07/2022 0433   CO2 22 09/07/2022 0433   GLUCOSE 124 (H) 09/07/2022 0433   BUN 39 (H) 09/07/2022 0433   BUN 21 06/18/2020 1556   CREATININE 1.38 (H) 09/07/2022 0433   CALCIUM 9.3 09/07/2022 0433   GFRNONAA 53 (L) 09/07/2022 0433   GFRAA 66 06/18/2020 1556    INR    Component Value Date/Time   INR 1.0 09/03/2022 1610     Intake/Output Summary (Last 24 hours) at 09/09/2022 1359 Last data filed at 09/09/2022 1105 Gross per 24 hour  Intake 360 ml  Output --  Net 360 ml     Assessment/Plan:  77 y.o. male is s/p aortogram and selective bilateral lower extremity angiograms.  5 Days Post-Op   PLAN: Vascular surgery plans on taking the patient to the operating room on Wednesday, 09/10/2022 for bilateral femoral endarterectomies with bilateral iliac stent placement and possible right SFA stent placement.   I discussed in detail with the patient the surgical procedure, benefits, risks, and complications.  Patient verbalizes understanding.  Answered all the patient's questions.  Patient consents to proceed with the  procedure tomorrow.  Patient will be made n.p.o. after midnight   DVT prophylaxis:  ASA 81 mg daily and Heparin Infusion    Marcie Bal [facility] 09/09/2022 1:59 PM

## 2022-09-09 NOTE — Progress Notes (Signed)
Progress Note    09/09/2022 1:59 PM 5 Days Post-Op  Subjective:  William Ball is a 77-year-old male with history of PAD, hypertension, hyperlipidemia, non-insulin-dependent diabetes mellitus, who presents to emergency department for chief concerns of right limb pain. Patient is placed on heparin drip for leg ischemia.  Patient is now postop day 2 from an aortogram and selective bilateral lower extremity angiograms.   On exam patient is resting comfortably. Patient states that he still has some discomfort in his right lower extremity. Patient's right lower extremity is cooler than his left lower extremity. Unable to palpate pulses in the patient's right lower extremity due to edema. No complaints overnight. Vitals all remained stable.   Vitals:   09/09/22 0300 09/09/22 0827  BP: (!) 141/60 (!) 148/57  Pulse: (!) 50 60  Resp: 18 18  Temp: 98.1 F (36.7 C) 97.6 F (36.4 C)  SpO2: 93% 97%   Physical Exam: Cardiac:  RRR, Normal S1, S2  Lungs:  Auscultation throughout, normal respiratory effort  Incisions:  Left groin incision with dressing clean dry and intact. No hematoma seroma to note.  Extremities:   Palpable pulses to left lower extremity, Unable to palpate PT/DP pulses on the right due to +2 edema.  Abdomen:  Positive bowel sounds, soft, NT/ND, no masses  Neurologic: A&O X 3; No focal weakness or paresthesias are detected; speech is fluent/normal.   CBC    Component Value Date/Time   WBC 7.6 09/08/2022 0528   RBC 4.67 09/08/2022 0528   HGB 14.0 09/08/2022 0528   HGB 14.0 06/13/2019 1612   HCT 41.8 09/08/2022 0528   HCT 41.5 06/13/2019 1612   PLT 253 09/08/2022 0528   PLT 258 06/13/2019 1612   MCV 89.5 09/08/2022 0528   MCV 92 06/13/2019 1612   MCH 30.0 09/08/2022 0528   MCHC 33.5 09/08/2022 0528   RDW 12.5 09/08/2022 0528   RDW 12.8 06/13/2019 1612   LYMPHSABS 3.7 09/03/2022 1610   LYMPHSABS 2.1 06/13/2019 1612   MONOABS 0.8 09/03/2022 1610   EOSABS 0.8 (H)  09/03/2022 1610   EOSABS 0.6 (H) 06/13/2019 1612   BASOSABS 0.1 09/03/2022 1610   BASOSABS 0.1 06/13/2019 1612    BMET    Component Value Date/Time   NA 133 (L) 09/07/2022 0433   NA 134 06/18/2020 1556   K 4.0 09/07/2022 0433   CL 103 09/07/2022 0433   CO2 22 09/07/2022 0433   GLUCOSE 124 (H) 09/07/2022 0433   BUN 39 (H) 09/07/2022 0433   BUN 21 06/18/2020 1556   CREATININE 1.38 (H) 09/07/2022 0433   CALCIUM 9.3 09/07/2022 0433   GFRNONAA 53 (L) 09/07/2022 0433   GFRAA 66 06/18/2020 1556    INR    Component Value Date/Time   INR 1.0 09/03/2022 1610     Intake/Output Summary (Last 24 hours) at 09/09/2022 1359 Last data filed at 09/09/2022 1105 Gross per 24 hour  Intake 360 ml  Output --  Net 360 ml     Assessment/Plan:  77 y.o. male is s/p aortogram and selective bilateral lower extremity angiograms.  5 Days Post-Op   PLAN: Vascular surgery plans on taking the patient to the operating room on Wednesday, 09/10/2022 for bilateral femoral endarterectomies with bilateral iliac stent placement and possible right SFA stent placement.   I discussed in detail with the patient the surgical procedure, benefits, risks, and complications.  Patient verbalizes understanding.  Answered all the patient's questions.  Patient consents to proceed with the   procedure tomorrow.  Patient will be made n.p.o. after midnight   DVT prophylaxis:  ASA 81 mg daily and Heparin Infusion    Rheta Hemmelgarn R Breanda Greenlaw [facility] 09/09/2022 1:59 PM   

## 2022-09-09 NOTE — Consult Note (Addendum)
ANTICOAGULATION CONSULT NOTE  Pharmacy Consult for Heparin Infusion Indication: Vascular Occlusion  Allergies  Allergen Reactions   Amlodipine Itching    Patient Measurements: Height: 5\' 9"  (175.3 cm) Weight: 85.3 kg (188 lb) IBW/kg (Calculated) : 70.7 kg Heparin Dosing Weight: 85.3 kg  Vital Signs: Temp: 98.1 F (36.7 C) (05/07 0300) BP: 141/60 (05/07 0300) Pulse Rate: 50 (05/07 0300)  Labs: Recent Labs    09/07/22 0433 09/08/22 0528 09/09/22 0452  HGB 13.9 14.0  --   HCT 41.8 41.8  --   PLT 259 253  --   HEPARINUNFRC 0.67 0.56 0.66  CREATININE 1.38*  --   --      Estimated Creatinine Clearance: 48.5 mL/min (A) (by C-G formula based on SCr of 1.38 mg/dL (H)).   Medical History: Past Medical History:  Diagnosis Date   Diabetes mellitus without complication (HCC)    borderline   Hepatomegaly    Hyperlipidemia    Hypertension    Substance abuse (HCC)     Medications:  Scheduled:   aspirin EC  81 mg Oral Daily   atorvastatin  20 mg Oral Daily   carvedilol  3.125 mg Oral BID   folic acid  1 mg Oral Daily   insulin aspart  0-9 Units Subcutaneous TID WC   irbesartan  300 mg Oral Daily   melatonin  5 mg Oral QHS   multivitamin with minerals  1 tablet Oral Daily   senna-docusate  2 tablet Oral BID   terazosin  2 mg Oral QHS   thiamine  100 mg Oral Daily   Or   thiamine  100 mg Intravenous Daily   Infusions:   heparin 1,350 Units/hr (09/09/22 0553)   PRN:   Assessment: William Ball is a 77 y.o. male presenting with concern for peripheral arterial occlusion. PMH significant for HLD, T2DM, CKD3, HTN, GERD, BPH, PAD. Patient reports increased redness swelling and pain of the right lower leg for the last few days. He had ultrasound on 4/4 which was negative for DVT. Patient was not on [facility] PTA per chart review. Pharmacy has been consulted to initiate and manage heparin infusion.   Baseline Labs: baseline aPTT, PT/INR and CBC ordered  Goal of Therapy:   Heparin level 0.3-0.7 units/ml Monitor platelets by anticoagulation protocol: Yes  05/02 0405 HL 0.34, therapeutic x 1 05/02 1200 HL 0.55 05/03 0102 HL 0.68 05/04 0356 HL 0.59 05/05 0433 HL 0.67 05/05 0528 HL 0.56, therapeutic @ 1350 un/hr 05/07 0452 HL 0.66   Plan:  Heparin remains therapeutic and CBC stable. Continue heparin infusion at 1350 units/hr Recheck HL with AM labs Per MD note, pt scheduled for surgery on 5/8,  heparin gtt set to d/c on 5/8 @ 0600, 6 hrs prior to procedure Continue to monitor H&H and platelets daily while on heparin infusion    Mariha Sleeper D 09/09/2022 6:41 AM

## 2022-09-09 NOTE — TOC Progression Note (Signed)
Transition of Care [facility]) - Progression Note    Patient Details  Name: William Ball MRN: [id number] Date of Birth: July 07, 1945  Transition of Care [facility]) CM/SW Contact  Chapman Fitch, RN Phone Number: 09/09/2022, 3:55 PM  Clinical Narrative:    Per vascular "Vascular surgery plans on taking the patient to the operating room on Wednesday, 09/10/2022 for bilateral femoral endarterectomies with bilateral iliac stent placement and possible right SFA stent placement"   Please consult TOC if needs should arise Per TOC note from 5/2 patient declined substance abuse resources    Expected Discharge Plan: Home/Self Care Barriers to Discharge: Continued Medical Work up  Expected Discharge Plan and Services     Post Acute Care Choice: NA Living arrangements for the past 2 months: Single Family Home                                       Social Determinants of Health (SDOH) Interventions SDOH Screenings   Food Insecurity: No Food Insecurity (09/04/2022)  Housing: Low Risk  (09/04/2022)  Transportation Needs: No Transportation Needs (09/04/2022)  Utilities: Not At Risk (09/04/2022)  Depression (PHQ2-9): Low Risk  (06/13/2019)  Financial Resource Strain: Low Risk  (08/06/2017)  Physical Activity: Inactive (08/06/2017)  Social Connections: Moderately Isolated (08/06/2017)  Stress: No Stress Concern Present (08/06/2017)  Tobacco Use: High Risk (09/05/2022)    Readmission Risk Interventions     No data to display

## 2022-09-09 NOTE — Progress Notes (Signed)
  Progress Note   Patient: William Ball QMV:[id number] DOB: [birth year] DOA: 09/03/2022     6 DOS: the patient was seen and examined on 09/09/2022   Brief hospital course:  [name] is a 77 year old male with history of PAD, hypertension, hyperlipidemia, non-insulin-dependent diabetes mellitus, who presents to emergency department for chief concerns of right limb pain. Patient is placed on heparin drip for leg ischemia, seen by vascular surgery, planning for intervention on 5/8    Principal Problem:   Acute lower limb ischemia Active Problems:   Hyperlipidemia   Gastroesophageal reflux   BPH (benign prostatic hyperplasia)   Smoking greater than 40 pack years   Aortic atherosclerosis (HCC)   Arterial insufficiency of lower extremity (HCC)   Hypertension associated with diabetes (HCC)   Right leg pain   Alcohol use   PVD (peripheral vascular disease) (HCC)   Hyponatremia   Overweight (BMI 25.0-29.9)   Assessment and Plan:  Acute lower limb ischemia Peripheral arterial disease. Tobacco abuse. Patient is status post lower extremity angiogram, definitive surgery is planned. Patient still on heparin drip, will continue. Patient advised to quit smoking. Angioplasty is scheduled on Wednesday.  Will continue keeping in the hospital until surgery. Patient still have significant intermittent leg pain, requiring IV pain medicine.  Still on heparin drip.  Surgery tomorrow.   Constipation from pain medicine. Improved.   Alcohol use No evidence of withdrawal.  Continue to follow.   Hypertension associated with diabetes (HCC) Continue ARB and beta-blocker Continue sliding scale insulin.  Aortic atherosclerosis (HCC) Aspirin 81 mg daily resumed, atorvastatin 20 mg daily resumed Pletal not resumed on admission as patient's current heparin GGT   BPH (benign prostatic hyperplasia) Terazosin 2 mg nightly   Hyperlipidemia Atorvastatin 20 mg daily resumed     Subjective:   Still complaining intermittent and leg pain.  Otherwise no other complaints.  Physical Exam: Vitals:   09/08/22 2002 09/08/22 2129 09/09/22 0300 09/09/22 0827  BP: (!) 146/61 (!) 154/56 (!) 141/60 (!) 148/57  Pulse: 64 60 (!) 50 60  Resp: 18  18 18   Temp: 97.9 F (36.6 C)  98.1 F (36.7 C) 97.6 F (36.4 C)  TempSrc:      SpO2: 96%  93% 97%  Weight:      Height:       General exam: Appears calm and comfortable  Respiratory system: Clear to auscultation. Respiratory effort normal. Cardiovascular system: S1 & S2 heard, RRR. No JVD, murmurs, rubs, gallops or clicks. No pedal edema. Gastrointestinal system: Abdomen is nondistended, soft and nontender. No organomegaly or masses felt. Normal bowel sounds heard. Central nervous system: Alert and oriented. No focal neurological deficits. Extremities: Symmetric 5 x 5 power. Skin: No rashes, lesions or ulcers Psychiatry: Judgement and insight appear normal. Mood & affect appropriate.    Data Reviewed:  There are no new results to review at this time.  Family Communication: None  Disposition: Status is: Inpatient Remains inpatient appropriate because: Severity of disease, IV treatment, inpatient procedure pending.     Time spent: 35 minutes  Author: Marrion Coy, MD 09/09/2022 12:08 PM  For on call review [email or web address].

## 2022-09-10 ENCOUNTER — Inpatient Hospital Stay: Payer: Medicare HMO | Admitting: Certified Registered"

## 2022-09-10 ENCOUNTER — Encounter
Admission: EM | Disposition: A | Discharge status: Home Health | Payer: Self-pay | Source: Home / Self Care | Attending: Internal Medicine

## 2022-09-10 ENCOUNTER — Inpatient Hospital Stay: Payer: Medicare HMO

## 2022-09-10 ENCOUNTER — Other Ambulatory Visit: Payer: Self-pay

## 2022-09-10 DIAGNOSIS — I70223 Atherosclerosis of native arteries of extremities with rest pain, bilateral legs: Secondary | ICD-10-CM

## 2022-09-10 DIAGNOSIS — T82856A Stenosis of peripheral vascular stent, initial encounter: Secondary | ICD-10-CM | POA: Diagnosis not present

## 2022-09-10 DIAGNOSIS — I998 Other disorder of circulatory system: Secondary | ICD-10-CM | POA: Diagnosis not present

## 2022-09-10 HISTORY — PX: INSERTION OF ILIAC STENT: SHX6256

## 2022-09-10 HISTORY — PX: ENDARTERECTOMY FEMORAL: SHX5804

## 2022-09-10 LAB — MAGNESIUM: Magnesium: 1.8 mg/dL (ref 1.7–2.4)

## 2022-09-10 LAB — GLUCOSE, CAPILLARY
Glucose-Capillary: 127 mg/dL — ABNORMAL HIGH (ref 70–99)
Glucose-Capillary: 131 mg/dL — ABNORMAL HIGH (ref 70–99)
Glucose-Capillary: 234 mg/dL — ABNORMAL HIGH (ref 70–99)
Glucose-Capillary: 260 mg/dL — ABNORMAL HIGH (ref 70–99)
Glucose-Capillary: 292 mg/dL — ABNORMAL HIGH (ref 70–99)

## 2022-09-10 LAB — CBC
HCT: 39.5 % (ref 39.0–52.0)
Hemoglobin: 13.2 g/dL (ref 13.0–17.0)
MCH: 29.9 pg (ref 26.0–34.0)
MCHC: 33.4 g/dL (ref 30.0–36.0)
MCV: 89.4 fL (ref 80.0–100.0)
Platelets: 255 K/uL (ref 150–400)
RBC: 4.42 MIL/uL (ref 4.22–5.81)
RDW: 12.5 % (ref 11.5–15.5)
WBC: 7.2 K/uL (ref 4.0–10.5)
nRBC: 0 % (ref 0.0–0.2)

## 2022-09-10 LAB — TYPE AND SCREEN
ABO/RH(D): A POS
Antibody Screen: NEGATIVE

## 2022-09-10 LAB — BASIC METABOLIC PANEL WITH GFR
Anion gap: 9 (ref 5–15)
BUN: 53 mg/dL — ABNORMAL HIGH (ref 8–23)
CO2: 22 mmol/L (ref 22–32)
Calcium: 10.6 mg/dL — ABNORMAL HIGH (ref 8.9–10.3)
Chloride: 105 mmol/L (ref 98–111)
Creatinine, Ser: 1.37 mg/dL — ABNORMAL HIGH (ref 0.61–1.24)
GFR, Estimated: 53 mL/min — ABNORMAL LOW
Glucose, Bld: 145 mg/dL — ABNORMAL HIGH (ref 70–99)
Potassium: 4.4 mmol/L (ref 3.5–5.1)
Sodium: 136 mmol/L (ref 135–145)

## 2022-09-10 LAB — ABO/RH: ABO/RH(D): A POS

## 2022-09-10 LAB — MRSA NEXT GEN BY PCR, NASAL: MRSA by PCR Next Gen: NOT DETECTED

## 2022-09-10 SURGERY — ENDARTERECTOMY FEMORAL
Anesthesia: General | Laterality: Bilateral

## 2022-09-10 MED ORDER — FENTANYL CITRATE (PF) 100 MCG/2ML IJ SOLN
INTRAMUSCULAR | Status: AC
  Filled 2022-09-10: qty 2

## 2022-09-10 MED ORDER — SODIUM CHLORIDE 0.9 % IV SOLN
INTRAVENOUS | Status: DC | PRN
  Administered 2022-09-10: 251 mL via TOPICAL

## 2022-09-10 MED ORDER — DEXAMETHASONE SODIUM PHOSPHATE 10 MG/ML IJ SOLN
INTRAMUSCULAR | Status: DC | PRN
  Administered 2022-09-10: 5 mg via INTRAVENOUS

## 2022-09-10 MED ORDER — DEXAMETHASONE SODIUM PHOSPHATE 10 MG/ML IJ SOLN
INTRAMUSCULAR | Status: AC
  Filled 2022-09-10: qty 1

## 2022-09-10 MED ORDER — HEPARIN SODIUM (PORCINE) 1000 UNIT/ML IJ SOLN
INTRAMUSCULAR | Status: AC
  Filled 2022-09-10: qty 10

## 2022-09-10 MED ORDER — LABETALOL HCL 5 MG/ML IV SOLN
INTRAVENOUS | Status: AC
  Filled 2022-09-10: qty 4

## 2022-09-10 MED ORDER — DEXMEDETOMIDINE HCL IN NACL 80 MCG/20ML IV SOLN
INTRAVENOUS | Status: DC | PRN
  Administered 2022-09-10 (×2): 4 ug via INTRAVENOUS
  Administered 2022-09-10 (×2): 8 ug via INTRAVENOUS

## 2022-09-10 MED ORDER — FENTANYL CITRATE PF 50 MCG/ML IJ SOSY: 12.5000 ug | PREFILLED_SYRINGE | Freq: Once | INTRAMUSCULAR | Status: DC | PRN

## 2022-09-10 MED ORDER — LABETALOL HCL 5 MG/ML IV SOLN
INTRAVENOUS | Status: DC | PRN
  Administered 2022-09-10 (×2): 5 mg via INTRAVENOUS

## 2022-09-10 MED ORDER — HYDROMORPHONE HCL 1 MG/ML IJ SOLN
1.0000 mg | Freq: Once | INTRAMUSCULAR | Status: AC | PRN
  Administered 2022-09-10: 1 mg via INTRAVENOUS
  Filled 2022-09-10: qty 1

## 2022-09-10 MED ORDER — CEFAZOLIN SODIUM-DEXTROSE 2-4 GM/100ML-% IV SOLN
INTRAVENOUS | Status: AC
  Filled 2022-09-10: qty 100

## 2022-09-10 MED ORDER — ONDANSETRON HCL 4 MG/2ML IJ SOLN
INTRAMUSCULAR | Status: AC
  Filled 2022-09-10: qty 2

## 2022-09-10 MED ORDER — HYDRALAZINE HCL 20 MG/ML IJ SOLN
INTRAMUSCULAR | Status: AC
  Filled 2022-09-10: qty 1

## 2022-09-10 MED ORDER — HEPARIN 30,000 UNITS/1000 ML (OHS) CELLSAVER SOLUTION
Status: AC
  Filled 2022-09-10: qty 1000

## 2022-09-10 MED ORDER — KETAMINE HCL 10 MG/ML IJ SOLN
INTRAMUSCULAR | Status: DC | PRN
  Administered 2022-09-10: 20 mg via INTRAVENOUS

## 2022-09-10 MED ORDER — EPHEDRINE 5 MG/ML INJ
INTRAVENOUS | Status: AC
  Filled 2022-09-10: qty 5

## 2022-09-10 MED ORDER — OXYCODONE HCL 5 MG PO TABS: 5.0000 mg | ORAL_TABLET | Freq: Once | ORAL | Status: DC | PRN

## 2022-09-10 MED ORDER — HYDROMORPHONE HCL 1 MG/ML IJ SOLN
INTRAMUSCULAR | Status: DC | PRN
  Administered 2022-09-10 (×2): .5 mg via INTRAVENOUS

## 2022-09-10 MED ORDER — VISTASEAL 10 ML SINGLE DOSE KIT
PACK | CUTANEOUS | Status: DC | PRN
  Administered 2022-09-10: 10 mL via TOPICAL

## 2022-09-10 MED ORDER — METOPROLOL TARTRATE 5 MG/5ML IV SOLN
INTRAVENOUS | Status: DC | PRN
  Administered 2022-09-10: 2 mg via INTRAVENOUS
  Administered 2022-09-10: 1 mg via INTRAVENOUS

## 2022-09-10 MED ORDER — ENOXAPARIN SODIUM 40 MG/0.4ML IJ SOSY
40.0000 mg | PREFILLED_SYRINGE | INTRAMUSCULAR | Status: DC
  Administered 2022-09-10 – 2022-09-14 (×5): 40 mg via SUBCUTANEOUS
  Filled 2022-09-10 (×5): qty 0.4

## 2022-09-10 MED ORDER — CLOPIDOGREL BISULFATE 75 MG PO TABS
75.0000 mg | ORAL_TABLET | Freq: Every day | ORAL | Status: DC
  Administered 2022-09-11 – 2022-09-15 (×5): 75 mg via ORAL
  Filled 2022-09-10 (×5): qty 1

## 2022-09-10 MED ORDER — LIDOCAINE HCL (CARDIAC) PF 100 MG/5ML IV SOSY
PREFILLED_SYRINGE | INTRAVENOUS | Status: DC | PRN
  Administered 2022-09-10: 60 mg via INTRAVENOUS

## 2022-09-10 MED ORDER — KETAMINE HCL 50 MG/5ML IJ SOSY
PREFILLED_SYRINGE | INTRAMUSCULAR | Status: AC
  Filled 2022-09-10: qty 5

## 2022-09-10 MED ORDER — HYDROMORPHONE HCL 1 MG/ML IJ SOLN
0.5000 mg | INTRAMUSCULAR | Status: DC | PRN
  Administered 2022-09-10 – 2022-09-12 (×5): 0.5 mg via INTRAVENOUS
  Filled 2022-09-10 (×5): qty 1

## 2022-09-10 MED ORDER — ROCURONIUM BROMIDE 100 MG/10ML IV SOLN
INTRAVENOUS | Status: DC | PRN
  Administered 2022-09-10: 50 mg via INTRAVENOUS
  Administered 2022-09-10 (×2): 20 mg via INTRAVENOUS
  Administered 2022-09-10: 30 mg via INTRAVENOUS
  Administered 2022-09-10: 20 mg via INTRAVENOUS
  Administered 2022-09-10: 15 mg via INTRAVENOUS
  Administered 2022-09-10: 20 mg via INTRAVENOUS

## 2022-09-10 MED ORDER — DROPERIDOL 2.5 MG/ML IJ SOLN: 0.6250 mg | Freq: Once | INTRAMUSCULAR | Status: DC | PRN

## 2022-09-10 MED ORDER — SUGAMMADEX SODIUM 200 MG/2ML IV SOLN
INTRAVENOUS | Status: DC | PRN
  Administered 2022-09-10: 200 mg via INTRAVENOUS

## 2022-09-10 MED ORDER — SODIUM CHLORIDE 0.9 % IV SOLN: INTRAVENOUS | Status: DC | PRN

## 2022-09-10 MED ORDER — IPRATROPIUM-ALBUTEROL 0.5-2.5 (3) MG/3ML IN SOLN
RESPIRATORY_TRACT | Status: AC
  Filled 2022-09-10: qty 3

## 2022-09-10 MED ORDER — ONDANSETRON HCL 4 MG/2ML IJ SOLN: 4.0000 mg | Freq: Four times a day (QID) | INTRAMUSCULAR | Status: DC | PRN

## 2022-09-10 MED ORDER — HEPARIN SODIUM (PORCINE) 1000 UNIT/ML IJ SOLN
INTRAMUSCULAR | Status: DC | PRN
  Administered 2022-09-10: 6000 [IU] via INTRAVENOUS
  Administered 2022-09-10: 2000 [IU] via INTRAVENOUS

## 2022-09-10 MED ORDER — HEMOSTATIC AGENTS (NO CHARGE) OPTIME
TOPICAL | Status: DC | PRN
  Administered 2022-09-10: 2 via TOPICAL

## 2022-09-10 MED ORDER — EPHEDRINE SULFATE (PRESSORS) 50 MG/ML IJ SOLN
INTRAMUSCULAR | Status: DC | PRN
  Administered 2022-09-10: 10 mg via INTRAVENOUS

## 2022-09-10 MED ORDER — FAMOTIDINE 20 MG PO TABS: 40.0000 mg | ORAL_TABLET | Freq: Once | ORAL | Status: DC | PRN

## 2022-09-10 MED ORDER — PROPOFOL 10 MG/ML IV BOLUS
INTRAVENOUS | Status: AC
  Filled 2022-09-10: qty 20

## 2022-09-10 MED ORDER — ONDANSETRON HCL 4 MG/2ML IJ SOLN
INTRAMUSCULAR | Status: DC | PRN
  Administered 2022-09-10: 4 mg via INTRAVENOUS

## 2022-09-10 MED ORDER — SODIUM CHLORIDE 0.9 % IV SOLN: INTRAVENOUS | Status: DC

## 2022-09-10 MED ORDER — PROMETHAZINE HCL 25 MG/ML IJ SOLN: 6.2500 mg | INTRAMUSCULAR | Status: DC | PRN

## 2022-09-10 MED ORDER — ACETAMINOPHEN 10 MG/ML IV SOLN: 1000.0000 mg | Freq: Once | INTRAVENOUS | Status: DC | PRN

## 2022-09-10 MED ORDER — DIPHENHYDRAMINE HCL 50 MG/ML IJ SOLN: 50.0000 mg | Freq: Once | INTRAMUSCULAR | Status: DC | PRN

## 2022-09-10 MED ORDER — CHLORHEXIDINE GLUCONATE CLOTH 2 % EX PADS
6.0000 | MEDICATED_PAD | Freq: Every day | CUTANEOUS | Status: DC
  Administered 2022-09-10 – 2022-09-12 (×3): 6 via TOPICAL

## 2022-09-10 MED ORDER — OXYCODONE HCL 5 MG/5ML PO SOLN: 5.0000 mg | Freq: Once | ORAL | Status: DC | PRN

## 2022-09-10 MED ORDER — SEVOFLURANE IN SOLN
RESPIRATORY_TRACT | Status: AC
  Filled 2022-09-10: qty 250

## 2022-09-10 MED ORDER — CEFAZOLIN SODIUM-DEXTROSE 2-4 GM/100ML-% IV SOLN
2.0000 g | INTRAVENOUS | Status: AC
  Administered 2022-09-10: 2 g via INTRAVENOUS
  Filled 2022-09-10: qty 100

## 2022-09-10 MED ORDER — HEPARIN 30,000 UNITS/1000 ML (OHS) CELLSAVER SOLUTION
Status: AC | PRN
  Administered 2022-09-10: 1

## 2022-09-10 MED ORDER — HYDROMORPHONE HCL 1 MG/ML IJ SOLN
INTRAMUSCULAR | Status: AC
  Filled 2022-09-10: qty 1

## 2022-09-10 MED ORDER — PROPOFOL 10 MG/ML IV BOLUS
INTRAVENOUS | Status: DC | PRN
  Administered 2022-09-10: 150 mg via INTRAVENOUS
  Administered 2022-09-10: 30 mg via INTRAVENOUS

## 2022-09-10 MED ORDER — METHYLPREDNISOLONE SODIUM SUCC 125 MG IJ SOLR: 125.0000 mg | Freq: Once | INTRAMUSCULAR | Status: DC | PRN

## 2022-09-10 MED ORDER — MIDAZOLAM HCL 2 MG/ML PO SYRP: 8.0000 mg | ORAL_SOLUTION | Freq: Once | ORAL | Status: DC | PRN

## 2022-09-10 MED ORDER — IPRATROPIUM-ALBUTEROL 0.5-2.5 (3) MG/3ML IN SOLN
3.0000 mL | Freq: Once | RESPIRATORY_TRACT | Status: AC
  Administered 2022-09-10: 3 mL via RESPIRATORY_TRACT

## 2022-09-10 MED ORDER — FENTANYL CITRATE (PF) 100 MCG/2ML IJ SOLN
25.0000 ug | INTRAMUSCULAR | Status: DC | PRN
  Administered 2022-09-10 (×2): 25 ug via INTRAVENOUS

## 2022-09-10 MED ORDER — HEPARIN SODIUM (PORCINE) 5000 UNIT/ML IJ SOLN
INTRAMUSCULAR | Status: AC
  Filled 2022-09-10: qty 1

## 2022-09-10 MED ORDER — FENTANYL CITRATE (PF) 100 MCG/2ML IJ SOLN
INTRAMUSCULAR | Status: DC | PRN
  Administered 2022-09-10: 50 ug via INTRAVENOUS
  Administered 2022-09-10: 25 ug via INTRAVENOUS
  Administered 2022-09-10 (×2): 50 ug via INTRAVENOUS
  Administered 2022-09-10: 25 ug via INTRAVENOUS

## 2022-09-10 SURGICAL SUPPLY — 84 items
ADH SKN CLS APL DERMABOND .7 (GAUZE/BANDAGES/DRESSINGS)
AGENT HMST PWDR BTL CLGN 5GM (Miscellaneous) ×1 IMPLANT
APL PRP STRL LF DISP 70% ISPRP (MISCELLANEOUS) ×1
BAG DECANTER FOR FLEXI CONT (MISCELLANEOUS) ×1
BALLN LUTONIX 7X100X130 (BALLOONS) ×3
BALLN LUTONIX AV 8X60X75 (BALLOONS) ×1
BALLN LUTONIX DCB 5X100X130 (BALLOONS) ×1
BLADE SURG 15 STRL LF DISP TIS (BLADE) ×1
BLADE SURG 15 STRL SS (BLADE) ×1
BLADE SURG SZ11 CARB STEEL (BLADE) ×1
BOOT SUTURE VASCULAR YLW (MISCELLANEOUS) ×2
BRUSH SCRUB EZ  4% CHG (MISCELLANEOUS) ×1
BRUSH SCRUB EZ 4% CHG (MISCELLANEOUS) ×1
CATH ANGIO 5F PIGTAIL 65CM (CATHETERS) ×1
CATH BEACON 5 .035 40 KMP TP (CATHETERS) ×1
CATH BEACON 5 .038 40 KMP TP (CATHETERS) ×1
CHLORAPREP W/TINT 26 (MISCELLANEOUS) ×1
COLLAGEN CELLERATERX 5 GRAM (Miscellaneous) ×1 IMPLANT
DERMABOND ADVANCED .7 DNX12 (GAUZE/BANDAGES/DRESSINGS)
DRAPE C-ARM XRAY 36X54 (DRAPES) ×1
DRAPE INCISE IOBAN 66X45 STRL (DRAPES) ×1
DRSG OPSITE POSTOP 4X6 (GAUZE/BANDAGES/DRESSINGS)
ELECT CAUTERY BLADE 6.4 (BLADE) ×1
ELECT REM PT RETURN 9FT ADLT (ELECTROSURGICAL) ×1
GAUZE 4X4 16PLY ~~LOC~~+RFID DBL (SPONGE) ×1
GLIDEWIRE ADV .035X180CM (WIRE) ×2
GLOVE BIO SURGEON STRL SZ7 (GLOVE) ×3
GOWN STRL REUS W/ TWL LRG LVL3 (GOWN DISPOSABLE) ×2
GOWN STRL REUS W/ TWL XL LVL3 (GOWN DISPOSABLE) ×2
GOWN STRL REUS W/TWL LRG LVL3 (GOWN DISPOSABLE) ×2
GOWN STRL REUS W/TWL XL LVL3 (GOWN DISPOSABLE) ×2
GRAFT VASC PATCH XENOSURE 1X14 (Vascular Products) ×1 IMPLANT
HEMOSTAT SURGICEL 2X3 (HEMOSTASIS) ×1
INTRODUCER 7FR 23CM (INTRODUCER) ×1
IV NS 500ML (IV SOLUTION) ×1
IV NS 500ML BAXH (IV SOLUTION) ×1
KIT ENCORE 26 ADVANTAGE (KITS) ×2
KIT PREVENA INCISION MGT 13 (CANNISTER)
KIT TURNOVER KIT A (KITS) ×1
LABEL OR SOLS (LABEL) ×1
LOOP VASCULAR MINI 18 RED (MISCELLANEOUS) ×1
LOOP VESSEL MAXI  1X406 RED (MISCELLANEOUS) ×4
LOOP VESSEL MAXI 1X406 RED (MISCELLANEOUS) ×4
LOOP VESSEL MINI 0.8X406 BLUE (MISCELLANEOUS) ×4
MANIFOLD NEPTUNE II (INSTRUMENTS) ×1
NDL ENTRY 21GA 7CM ECHOTIP (NEEDLE) IMPLANT
NDL SAFETY ECLIP 18X1.5 (MISCELLANEOUS) ×1
NEEDLE ENTRY 21GA 7CM ECHOTIP (NEEDLE) ×1
NS IRRIG 500ML POUR BTL (IV SOLUTION) ×1
PACK ANGIOGRAPHY (CUSTOM PROCEDURE TRAY) ×1
PACK BASIN MAJOR ARMC (MISCELLANEOUS) ×1
PACK UNIVERSAL (MISCELLANEOUS) ×1
SET INTRO CAPELLA COAXIAL (SET/KITS/TRAYS/PACK) ×1
SET WALTER ACTIVATION W/DRAPE (SET/KITS/TRAYS/PACK) ×2
SHEATH BRITE TIP 8FRX11 (SHEATH) ×2
SPONGE T-LAP 18X18 ~~LOC~~+RFID (SPONGE) ×2
STAPLER SKIN PROX 35W (STAPLE) ×1
STENT LIFESTREAM 9X58X80 (Permanent Stent) ×2 IMPLANT
STENT VIABAHN 8X100X120 (Permanent Stent) ×2 IMPLANT
STENT VIABAHN 8X10X120 (Permanent Stent) ×2 IMPLANT
SUT MNCRL 4-0 (SUTURE)
SUT MNCRL 4-0 27XMFL (SUTURE)
SUT PROLENE 5 0 RB 1 DA (SUTURE) ×2
SUT PROLENE 6 0 BV (SUTURE) ×24
SUT PROLENE 7 0 BV 1 (SUTURE) ×2
SUT SILK 2 0 (SUTURE) ×1
SUT SILK 2-0 18XBRD TIE 12 (SUTURE) ×1
SUT SILK 3 0 (SUTURE) ×1
SUT SILK 3-0 18XBRD TIE 12 (SUTURE) ×1
SUT SILK 4 0 (SUTURE) ×1
SUT SILK 4-0 18XBRD TIE 12 (SUTURE) ×1
SUT VIC AB 2-0 CT1 27 (SUTURE) ×2
SUT VIC AB 2-0 CT1 TAPERPNT 27 (SUTURE) ×2
SUT VIC AB 3-0 SH 27 (SUTURE) ×1
SUT VIC AB 3-0 SH 27X BRD (SUTURE) ×1
SUT VICRYL+ 3-0 36IN CT-1 (SUTURE) ×2
SYR 20ML LL LF (SYRINGE) ×1
SYR 5ML LL (SYRINGE) ×1
TAG SUTURE CLAMP YLW 5PR (MISCELLANEOUS) ×2
TRAP FLUID SMOKE EVACUATOR (MISCELLANEOUS) ×1
TRAY FOLEY MTR SLVR 16FR STAT (SET/KITS/TRAYS/PACK) ×1
VASCULAR TIE MINI RED 18IN STL (MISCELLANEOUS) ×1
WATER STERILE IRR 500ML POUR (IV SOLUTION) ×1
WIRE G 018X200 V18 (WIRE) ×2

## 2022-09-10 NOTE — Op Note (Signed)
OPERATIVE NOTE   PROCEDURE: Right common femoral and profunda femoris endarterectomy with bovine pericardial patch angioplasty. Left common femoral and superficial femoral  endarterectomy with bovine pericardial patch angioplasty. Open angioplasty and stent placement bilateral common iliac arteries using kissing balloon technique. Additional stent placement right external iliac artery with an 8 mm Viabahn stent. Additional stent placement left external iliac artery with an 8 mm Viabahn stent. Bilateral incisional (disposable) VAC dressing application  PRE-OPERATIVE DIAGNOSIS: Atherosclerotic occlusive disease bilateral lower extremities with lifestyle limiting claudication and significant rest pain symptoms  POST-OPERATIVE DIAGNOSIS: Same  CO-SURGEON: Renford Dills, MD and Annice Needy, M.D.  ASSISTANT(S): None  ANESTHESIA: general  ESTIMATED BLOOD LOSS: 200 cc  FINDING(S): Profound calcific plaque noted bilaterally extending past the initial bifurcation of the profunda femoris arteries and superficial femoral arteries.   SPECIMEN(S):  Calcific plaque from the common femoral, superficial femoral and the profunda femoris arteries bilaterally  INDICATIONS:   William Ball 77 y.o. y.o.male who presents with complaints of lifestyle limiting claudication and pain continuously in the feet bilaterally. The patient has documented severe atherosclerotic occlusive disease and has undergone multiple minimally invasive treatments in the past. However, at this point his primary area of stricture stenosis resides in the common femoral and origins of the superficial femoral and profunda femoris extending into these arteries and therefore this is not amenable to intervention and he is now undergoing open endarterectomy. The risks and benefits of been reviewed with the patient, all questions have answered; alternative therapies have been reviewed as well and the patient has agreed to proceed  with surgical open repair.  DESCRIPTION: After obtaining full informed written consent, the patient was brought back to the operating room and placed supine upon the operating table.  The patient received IV antibiotics prior to induction.  After obtaining adequate anesthesia, the patient was prepped and draped in the standard fashion for: bilateral femoral exposure.    Co-surgeons are required because this is a bilateral procedure with work being performed simultaneously from both the right femoral and left femoral approach.  This also expedite the procedure making a shorter operative time reducing complications and improving patient safety.  Attention was turned to the bilateral groins with Dr. Wyn Quaker working on the right and myself working on the left of the patient.  Vertical  incisions were made over the common femoral artery and dissected down to the common femoral artery with electrocautery.  I dissected out the common femoral artery from the distal external iliac artery (identified by the superficial circumflex vessels) down to the femoral bifurcation.  On initial inspection, the common femoral artery was: Densely calcified and there was no palpable pulse noted bilaterally.    Subsequently the dissection was continued to include all circumflex branches and the profunda femoral artery and superficial femoral artery. The superficial femoral artery was dissected circumferentially for a distance of approximately 3-4 cm and the profunda femoris was dissected circumferentially out to the fourth order branches individual vessel loops were placed around each branch. Both of the groins were treated simultaneously as described above. Control of all branches was obtained with vessel loops.  A softer area in the distal external iliac artery amendable to clamping was identified.    The patient was given 6000 units of Heparin intravenously, (an additional 2000 units was given just prior to beginning the  interventional portion) which was a therapeutic bolus.   After waiting 3 minutes, the left distal external iliac artery was clamped  and we placed all circumflex branches, and the profunda and superficial femoral arteries under tension.  Arteriotomy was made in the left common femoral artery with a 11-blade and extended it with a Potts scissor proximally and distally extending the distal end down the SFA for approximately 3 cm.   Endarterectomy was then performed under direct visualization using a freer elevator and a right angle from the mid common femoral extending  both proximally and distally. Proximally the endarterectomy was brought up to the level of the clamp where a clean edge was obtained. Distally the endarterectomy was carried down to a soft spot in the SFA where a feathered edge would was obtained.  The profunda femoris was treated with an eversion technique extending endarterectomy.  6-0 Prolene interrupted tacking sutures were placed to secure the leading edge of the plaque in the SFA and the profunda femoris.  At this point, I fashioned a core matrix patch for the geometry of the left femoral arteriotomy.  The patch was sewn to the artery with 2 running stitches of 6-0 Prolene, running from each end.  Prior to completing the patch angioplasty, the profunda femoral artery was flushed as was the superficial femoral artery. The system was then forward flushed. The endarterectomy site was then irrigated copiously with heparinized saline. The patch angioplasty was completed in the usual fashion.  Flow was then reestablished first to the profunda femoris and then the superficial femoral artery. Any gaps or bleeding sites in the suture line were easily controlled with a 6-0 Prolene suture.  We then turned our attention to the right side.  Arteriotomy was made in the right common femoral artery with a 11-blade and extended it with a Potts scissor proximally and distally extending the distal end down the  SFA for approximately 1-2 cm.   Endarterectomy was then performed under direct visualization using a freer elevator and a right angle from the mid common femoral extending  both proximally and distally. Proximally the right endarterectomy was brought up to the level of the clamp where a clean edge was obtained. Distally the endarterectomy was carried down to a soft spot in the SFA where a feathered edge would was obtained.  The profunda femoris was treated with an extensive eversion technique extending endarterectomy.  Endarterectomy of the right profunda femoris extended more than 2 cm distally.  6-0 Prolene interrupted tacking sutures were placed to secure the leading edge of the plaque in the SFA and the profunda femoris.  At this point, we fashioned a core matrix patch for the geometry of the right femoral arteriotomy.  The patch was sewn to the artery with 2 running stitches of 6-0 Prolene, running from each end.  A gap was left in the medial wall to allow for placement of the sheath on the right side.    We then turned our attention to the interventional portion of the operation.  With Dr. Wyn Quaker working on the right side of the patient myself working on the left side of the patient assisting 1 another with our contralateral sides we began the intervention.  On the left we accessed the center of the patch with a microneedle followed by microwire.  Micro sheath was then placed followed by negotiating an advantage wire into the abdominal aorta through the multiple stenoses.  On the right side we place a sheath through the gap in the suture line of the patch.  Working again with a Kumpe catheter and a advantage wire he was also able  to cross the occlusion in the external as well as the stenoses in the common and advance the wire into the abdominal aorta.  Pigtail catheter was then advanced up the right side and distal aortogram with bilateral iliac imaging was performed.  This demonstrated the distal aorta  was diffusely diseased but there are no hemodynamically significant lesions in the aorta itself.  Within the bilateral common iliac arteries there were greater than 80% stenoses with diffuse calcific disease.  In the right external iliac artery there was a segmental occlusion with reconstitution distally.  On the left within the previously stented segment there was a greater than 90% stenosis associated with a greater than 80% stenosis in the distal external iliac artery just proximal to the circumflex branches.  This point we selected a 9 x 58 mm Lifestream stent for both sides the aortic bifurcation was marked and the stents were brought up and deployed simultaneously in the common iliac arteries using the kissing balloon technique.  Working to complete the left side follow-up imaging was performed and we selected a 8 by the 100 Viabahn stent and deployed this with its distal edge at the level of the circumflex vessels.  Approximately 4 to 5 cm of gap between the Lifestream stent and the Viabahn stent remained in the distal common iliac on the left but this had been stented previously and therefore an 8 mm x 60 mm Lutonix drug-eluting balloon was advanced across this section and inflated to 12 atm for approximately 1 minute.  A 7 mm x 100 mm balloon was then advanced through the Viabahn stent and inflated to 10 atm for approximately 1 minute.  Follow-up imaging demonstrated less than 10% residual stenosis throughout the entire common and external iliac arteries with restoration of proper inflow to the common femoral.  Attention was then turned to the right where the occluded segment was predilated with a 5 mm Lutonix drug-eluting balloon.  Using this balloon as a measurement guide an 8 mm x 100 mm Viabahn was selected and then deployed beginning in the distal Lifestream stent and extending down to the level of the circumflex vessels.  This was then postdilated with a 7 mm balloon.  Inflation was to 14 atm for  1 minute.  Follow-up imaging now demonstrated less than 10% residual stenosis throughout the common and external iliac arteries on the right with proper inflow to the common femoral and we elected to remove the sheaths and complete the surgical repair of the femoral arteries.  Arteriotomy was made in the left common femoral artery with a 11-blade and extended it with a Potts scissor proximally and distally extending the distal end down the SFA for approximately 3 cm.   Endarterectomy was then performed under direct visualization using a freer elevator and a right angle from the mid common femoral extending  both proximally and distally. Proximally the endarterectomy was brought up to the level of the clamp where a clean edge was obtained. Distally the endarterectomy was carried down to a soft spot in the SFA where a feathered edge would was obtained.  The profunda femoris was treated with an eversion technique extending endarterectomy.  6-0 Prolene interrupted tacking sutures were placed to secure the leading edge of the plaque in the SFA and the profunda femoris.  Prior to completing the right patch angioplasty, the profunda femoral artery was flushed as was the superficial femoral artery. The system was then forward flushed. The endarterectomy site was then irrigated copiously with heparinized  saline. The patch angioplasty was completed in the usual fashion.  Flow was then reestablished first to the profunda femoris and then the superficial femoral artery. Any gaps or bleeding sites in the suture line were easily controlled with a 6-0 Prolene suture.  Both right and left groins were then irrigated copiously with sterile saline and subsequently Evicel and Surgicel were placed in the wound. The incision was repaired with a double layer of 2-0 Vicryl, a double layer of 3-0 Vicryl, and surgical staples were then used to close skin.  Prevena incisional disposable vacs were then used as the  dressing.  COMPLICATIONS: None  CONDITION: Almon Register, M.D. [facility] Office: [phone]  09/10/2022, 4:01 PM

## 2022-09-10 NOTE — Transfer of Care (Signed)
Immediate Anesthesia Transfer of Care Note  Patient: William Ball  Procedure(s) Performed: ENDARTERECTOMY FEMORAL (Bilateral) INSERTION OF ILIAC STENT (Bilateral) APPLICATION OF CELL SAVER (Bilateral)  Patient Location: PACU  Anesthesia Type:General  Level of Consciousness: drowsy and patient cooperative  Airway & Oxygen Therapy: Patient Spontanous Breathing and Patient connected to face mask oxygen  Post-op Assessment: Report given to RN and Post -op Vital signs reviewed and stable  Post vital signs: stable  Last Vitals:  Vitals Value Taken Time  BP 107/55 09/10/22 1546  Temp    Pulse 86 09/10/22 1559  Resp 29 09/10/22 1559  SpO2 98 % 09/10/22 1559  Vitals shown include unvalidated device data.  Last Pain:  Vitals:   09/10/22 0947  TempSrc: Tympanic  PainSc: 3       Patients Stated Pain Goal: 1 (09/10/22 0947)  Complications: There were no known notable events for this encounter.

## 2022-09-10 NOTE — Op Note (Signed)
OPERATIVE NOTE   PROCEDURE: 1.   Left common femoral and superficial femoral artery endarterectomies with bovine pericardial patch angioplasty 2.   Right common femoral and profunda femoris endarterectomies with bovine pericardial patch angioplasty 3.   Aortogram and iliofemoral angiogram 4.   Bilateral common iliac artery stent placements in a kissing balloon fashion with 9 mm diameter by 58 mm length Lifestream stents on both sides 5.   Additional stent placement in the left external iliac artery with 8 mm diameter by 10 cm length Viabahn stent 6.   Additional stent placement to the right external iliac artery with 8 mm diameter by 10 cm length Viabahn stent 7.   Bilateral incisional (disposable) VAC dressing placement.   PRE-OPERATIVE DIAGNOSIS: 1.Atherosclerotic occlusive disease bilateral lower extremities with rest pain   POST-OPERATIVE DIAGNOSIS: Same  SURGEON: Festus Barren, MD  CO-surgeon: Levora Dredge, MD  ANESTHESIA:  general  ESTIMATED BLOOD LOSS: 200 cc  FINDING(S): 1.  significant plaque in bilateral common femoral arteries as well as the proximal portion of the superficial femoral artery on the left and the profunda femoris artery on the right  SPECIMEN(S): Left common femoral and superficial femoral artery plaque Right common femoral and profunda femoris artery plaque  INDICATIONS:    Patient presents with severe femoral artery and bilateral iliac artery disease with rest pain.  Bilateral femoral endarterectomies as well as iliac intervention are planned to try to improve perfusion.  The risks and benefits as well as alternative therapies including intervention were reviewed in detail all questions were answered the patient agrees to proceed with surgery.  DESCRIPTION: After obtaining full informed written consent, the patient was brought back to the operating room and placed supine upon the operating table.  The patient received IV antibiotics prior to induction.   After obtaining adequate anesthesia, the patient was prepped and draped in the standard fashion appropriate time out is called.    With myself working on the right and Dr. Gilda Crease working on the left we began by dissecting out the femoral arteries on each side. Vertical incisions were created overlying both femoral arteries. The common femoral artery proximally, and superficial femoral artery, and primary profunda femoris artery branches were encircled with vessel loops and prepared for control. Both femoral arteries were found to have significant plaque from the common femoral artery into the profunda and superficial femoral arteries.   6000 units of heparin was given and allowed circulate for 5 minutes. Additional heparin was given as needed later in the procedure.  Attention is then turned to the left femoral artery.  An arteriotomy is made with 11 blade and extended with Potts scissors in the common femoral artery and carried down onto the first 2-3 cm of the superficial femoral artery artery. An endarterectomy was then performed. The [facility] [place] was used to create a plane. The proximal endpoint was cut flush with tenotomy scissors. This was in the proximal common femoral artery.  The profunda femoris artery did not appear heavily diseased and the endarterectomy was taken at the origin of the profunda femoris artery and then tacked down with a series of 6-0 Prolene sutures at the origin of the profunda femoris artery to ensure good flow.  The distal endpoint of the superficial femoral endarterectomy was created with gentle traction and the distal endpoint was tacked down with three 6-0 Prolene sutures.  The bovine pericardial patch is then selected and prepared for a patch angioplasty.  It is cut and beveled and started at  the proximal endpoint with a 6-0 Prolene suture.  Approximately one half of the suture line is run medially and laterally and the distal end point was cut and bevelled to match  the arteriotomy.  A second 6-0 Prolene was started at the distal end point and run to the mid portion to complete the arteriotomy.  The vessel was flushed prior to release of control and completion of the anastomosis.  At this point, flow was established first to the profunda femoris artery and then to the superficial femoral artery.  On the left side, we closed the arteriotomy completely and then stuck the patch for our endovascular portion of the procedure.  The right femoral artery is then addressed. Arteriotomy is made in the common femoral artery and extended down into the first centimeter or 2 of the superficial femoral artery. Similarly, an endarterectomy was performed with the [facility]. The proximal endpoint was cut flush with tenotomy scissors in the proximal common femoral artery.  The proximal centimeter or 2 of the profunda femoris artery was addressed and treated with an eversion endarterectomy and this was performed with a hemostat and gentle traction. The proximal portion of the SFA was relatively clean and so we ended our endarterectomy right at the origin of the SFA and then tacked this down with four 6-0 Prolene sutures. The distal endpoint of the profunda femoris endarterectomy was tacked down with three 6-0 Prolene sutures. The bovine pericardial patch was then brought onto the field.  It is cut and beveled and started at the proximal endpoint with a 6-0 Prolene suture.  Approximately one half of the suture line is run medially and laterally and the distal end point was cut and bevelled to match the arteriotomy.  A second 6-0 Prolene was started at the distal end point and run to the mid portion to complete the arteriotomy leaving a short gap for placement of the sheath for the endovascular portion of the procedure since the right external iliac artery was occluded.    Dr. Gilda Crease then accessed the patch in the left femoral artery and was able to navigate an advantage wire up into  the aorta without difficulty placing an 8 French sheath.  I placed an 8 French sheath through the gap in the arteriotomy suture line and was able to advance an advantage wire and Kumpe catheter up into the external iliac artery.  After selective imaging, I was able to cross the occlusion with minimal difficulty and get up into the aorta where we exchanged for a pigtail catheter for better imaging.  Oblique images were performed as well.  The aorta was calcific but not stenotic.  The left common iliac artery had about a 70% stenosis just proximal to the previously placed stent and then about a 90 to 95% stenosis just distal to the previously placed stent with some hyperplasia at the distal portion of the previously placed stent.  On the right side, the right common iliac artery was heavily calcified and only mildly diseased proximally but in the distal segment of the common iliac artery it became heavily diseased and then occluded down through the remainder of the common iliac artery.  The external iliac artery had reconstitution but there was another short segment occlusion in the proximal segment and then moderate disease in the distal segment of the right external iliac artery.  We began by placing 9 mm diameter by 58 mm length Lifestream stents in the common iliac arteries bilaterally in a kissing balloon  fashion terminating at the origins of the common iliac arteries bilaterally.  This went down into the previously placed stent and address the lesion on the left and got down to the initial portion of disease on the right as well.  On the left side, after exchanging for a 0.018 wire an 8 mm diameter by 10 cm length Viabahn stent was deployed and postdilated with a 7 mm balloon throughout and an 8 mm Lutonix drug-coated balloon proximally.  Completion imaging showed less than 10% residual stenosis in the left common and external iliac arteries after these 2 stent placements.  Attention was then turned to the  right.  We predilated this lesion due to the densely calcified occlusion with a 5 mm diameter Lutonix drug-coated balloon.  We then selected an 8 mm diameter by 10 cm length Viabahn stent as no flow was seen in the hypogastric artery at this point.  This had about 1 cm overlap to the Lifestream stent and came down to the distal external iliac artery encompassing the remainder of the disease.  This was postdilated with a 7 mm diameter Lutonix drug-coated balloon with excellent angiographic completion result and less than 10% residual stenosis in the right common and external iliac arteries.  Both sheaths were then removed.  On the left side, the access site and the patch was repaired with 2 mattress 6-0 Prolene sutures.  We flushed and de-aired prior to closure of this access site.  On the right side, flushing maneuvers were performed and flow was reestablished to the femoral vessels with completion of the patch angioplasty closure of the arteriotomy.  6-0 Prolene patch sutures were used for hemostasis as needed. Excellent pulses noted in the bilateral superficial femoral and profunda femoris artery below the femoral anastomosis.  Fibrillar and Vistacel topical hemostatic agents were placed in the femoral incisions and hemostasis was complete. The femoral incisions were then closed in a layered fashion with 2 layers of 2-0 Vicryl, 2 layers of 3-0 Vicryl, and staples with an incisional (disposable) VAC for the skin closure. Cellerate was used with wound closure as well.  The patient was then awakened from anesthesia and taken to the recovery room in stable condition having tolerated the procedure well.  COMPLICATIONS: None  CONDITION: Stable     Festus Barren 09/10/2022 3:55 PM  This note was created with Dragon Medical transcription system. Any errors in dictation are purely unintentional.

## 2022-09-10 NOTE — Interval H&P Note (Signed)
History and Physical Interval Note:  09/10/2022 10:27 AM  William Ball  has presented today for surgery, with the diagnosis of PAD.  The various methods of treatment have been discussed with the patient and family. After consideration of risks, benefits and other options for treatment, the patient has consented to  Procedure(s): ENDARTERECTOMY FEMORAL (Bilateral) INSERTION OF ILIAC STENT (Bilateral) APPLICATION OF CELL SAVER (Bilateral) as a surgical intervention.  The patient's history has been reviewed, patient examined, no change in status, stable for surgery.  I have reviewed the patient's chart and labs.  Questions were answered to the patient's satisfaction.     Festus Barren

## 2022-09-10 NOTE — Anesthesia Preprocedure Evaluation (Signed)
Anesthesia Evaluation  Patient identified by MRN, date of birth, ID band Patient awake    Reviewed: Allergy & Precautions, H&P , NPO status , Patient's Chart, lab work & pertinent test results, reviewed documented beta blocker date and time   Airway Mallampati: II  TM Distance: >3 FB Neck ROM: full    Dental  (+) Edentulous Upper, Edentulous Lower   Pulmonary COPD, Current Smoker and Patient abstained from smoking.   Pulmonary exam normal        Cardiovascular Exercise Tolerance: Poor hypertension, On Medications + Peripheral Vascular Disease  Normal cardiovascular exam Rhythm:regular Rate:Normal     Neuro/Psych negative neurological ROS  negative psych ROS   GI/Hepatic Neg liver ROS,GERD  Medicated,,  Endo/Other  negative endocrine ROSdiabetes, Well Controlled, Type 2, Oral Hypoglycemic Agents    Renal/GU Renal disease  negative genitourinary   Musculoskeletal   Abdominal   Peds  Hematology negative hematology ROS (+)   Anesthesia Other Findings Past Medical History: No date: Diabetes mellitus without complication (HCC)     Comment:  borderline No date: Hepatomegaly No date: Hyperlipidemia No date: Hypertension No date: Substance abuse (HCC) Past Surgical History: No date: ANGIOPLASTY No date: APPENDECTOMY 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  patient unsure of this but thinks this is what               happened No date: CARDIAC SURGERY 11/13/2015: CATARACT EXTRACTION W/PHACO; Left     Comment:  Procedure: CATARACT EXTRACTION PHACO AND INTRAOCULAR               LENS PLACEMENT (IOC);  Surgeon: Galen Manila, MD;                Location: [facility] ORS;  Service: Ophthalmology;  Laterality:              Left;  [place] 1.06 AP% 23.9 CDE 15.81 Fluid pack lot #               [id number] 11/27/2015: CATARACT EXTRACTION W/PHACO; Right     Comment:  Procedure: CATARACT EXTRACTION PHACO AND  INTRAOCULAR               LENS PLACEMENT (IOC);  Surgeon: Galen Manila, MD;                Location: [facility] ORS;  Service: Ophthalmology;  Laterality:              Right;  [place] 1.06 AP% 23.8 CDE 15.80 Fluid pack lot #               [id number] 2012: COLONOSCOPY     Comment:  polyps, diverticulosis , 69yr repeat 07/17/2016: COLONOSCOPY WITH PROPOFOL; N/A     Comment:  Procedure: COLONOSCOPY WITH PROPOFOL;  Surgeon: Wyline Mood, MD;  Location: [facility] ENDOSCOPY;  Service: Endoscopy;              Laterality: N/A; 1982: FRACTURE SURGERY; Left     Comment:  foot, leg.unsure if metal in these parts.possibly               replaced ankle 09/04/2022: LOWER EXTREMITY ANGIOGRAPHY; Right     Comment:  Procedure: Lower Extremity Angiography;  Surgeon: Annice Needy, MD;  Location: [facility] INVASIVE CV LAB;  Service:  Cardiovascular;  Laterality: Right; 12/27/2015: PERIPHERAL VASCULAR CATHETERIZATION; Left     Comment:  Procedure: Lower Extremity Angiography;  Surgeon: Annice Needy, MD;  Location: [facility] INVASIVE CV [facility];  Service:               Cardiovascular;  Laterality: Left; BMI    Body Mass Index: 27.02 kg/m     Reproductive/Obstetrics negative OB ROS                             Anesthesia Physical Anesthesia Plan  ASA: 4  Anesthesia Plan: General ETT   Post-op Pain Management:    Induction:   PONV Risk Score and Plan: 2  Airway Management Planned:   Additional Equipment:   Intra-op Plan:   Post-operative Plan:   Informed Consent: I have reviewed the patients History and Physical, chart, labs and discussed the procedure including the risks, benefits and alternatives for the proposed anesthesia with the patient or authorized representative who has indicated his/her understanding and acceptance.     Dental Advisory Given  Plan Discussed with: CRNA  Anesthesia Plan Comments:        Anesthesia Quick  Evaluation

## 2022-09-10 NOTE — Anesthesia Procedure Notes (Signed)
Procedure Name: Intubation Date/Time: 09/10/2022 11:14 AM  Performed by: Monico Hoar, CRNAPre-anesthesia Checklist: Patient identified, Patient being monitored, Timeout performed, Emergency Drugs available and Suction available Patient Re-evaluated:Patient Re-evaluated prior to induction Oxygen Delivery Method: Circle system utilized Preoxygenation: Pre-oxygenation with 100% oxygen Induction Type: IV induction Ventilation: Mask ventilation without difficulty Laryngoscope Size: Mac and 4 Grade View: Grade I Tube type: Oral Tube size: 7.0 mm Number of attempts: 1 Airway Equipment and Method: Stylet Placement Confirmation: ETT inserted through vocal cords under direct vision, positive ETCO2 and breath sounds checked- equal and bilateral Secured at: 21 cm Tube secured with: Tape Dental Injury: Teeth and Oropharynx as per pre-operative assessment

## 2022-09-10 NOTE — Progress Notes (Signed)
Patient accepted from PACU post procedure to ICU bed 14 at 17:00.  VSS.  Patient assessment complete, see flowsheets.  Family at bedside

## 2022-09-10 NOTE — Progress Notes (Signed)
  PROGRESS NOTE    William Ball  ZOX:[id number] DOB: [birth year] DOA: 09/03/2022 PCP: Luciana Axe, NP  IC14A/IC14A-AA  LOS: 7 days   Brief hospital course:   Assessment & Plan: [name] is a 77 year old male with history of PAD, hypertension, hyperlipidemia, non-insulin-dependent diabetes mellitus, who presents to emergency department for chief concerns of right limb pain. Patient is placed on heparin drip for leg ischemia, seen by vascular surgery, planning for intervention on 5/8  Acute lower limb ischemia Peripheral arterial disease. Tobacco abuse. status post lower extremity angiogram. --received heparin gtt Patient advised to quit smoking. Plan: --femoral endarterectomy today   Constipation from pain medicine. Improved.   Alcohol use No evidence of withdrawal.     Hypertension associated with diabetes (HCC) --cont coreg and irbesartan   Aortic atherosclerosis (HCC) --cont ASA and statin Pletal not resumed on admission as patient was on heparin GGT   BPH (benign prostatic hyperplasia) Terazosin 2 mg nightly   Hyperlipidemia --cont statin    DVT prophylaxis: Lovenox SQ Code Status: Full code  Family Communication:  Level of care: ICU Dispo:   The patient is from: home Anticipated d/c is to: home Anticipated d/c date is: 1-2 days   Subjective and Interval History:  Went for femoral endarterectomy today.  Tolerated it well.   Objective: Vitals:   09/10/22 1647 09/10/22 1700 09/10/22 1705 09/10/22 1800  BP: (!) 131/53  (!) 140/55 (!) 147/54  Pulse: 73 77  76  Resp: 12 14  14   Temp: 98.4 F (36.9 C)  98 F (36.7 C)   TempSrc:   Oral   SpO2: 97% 95%  99%  Weight:      Height:        Intake/Output Summary (Last 24 hours) at 09/10/2022 1835 Last data filed at 09/10/2022 1800 Gross per 24 hour  Intake 1848.31 ml  Output 1900 ml  Net -51.69 ml   Filed Weights   09/03/22 1600 09/10/22 0947  Weight: 85.3 kg 83 kg    Examination:    Constitutional: NAD HEENT: conjunctivae and lids normal, EOMI CV: No cyanosis.   RESP: normal respiratory effort SKIN: warm, dry   Data Reviewed: I have personally reviewed labs and imaging studies  Time spent: 35 minutes  Darlin Priestly, MD [facility] If 7PM-7AM, please contact night-coverage 09/10/2022, 6:35 PM

## 2022-09-11 ENCOUNTER — Encounter: Payer: Self-pay | Admitting: Vascular Surgery

## 2022-09-11 DIAGNOSIS — I998 Other disorder of circulatory system: Secondary | ICD-10-CM | POA: Diagnosis not present

## 2022-09-11 LAB — GLUCOSE, CAPILLARY
Glucose-Capillary: 156 mg/dL — ABNORMAL HIGH (ref 70–99)
Glucose-Capillary: 190 mg/dL — ABNORMAL HIGH (ref 70–99)
Glucose-Capillary: 174 mg/dL — ABNORMAL HIGH (ref 70–99)
Glucose-Capillary: 198 mg/dL — ABNORMAL HIGH (ref 70–99)

## 2022-09-11 LAB — BASIC METABOLIC PANEL WITH GFR
Anion gap: 9 (ref 5–15)
BUN: 44 mg/dL — ABNORMAL HIGH (ref 8–23)
CO2: 19 mmol/L — ABNORMAL LOW (ref 22–32)
Calcium: 8.9 mg/dL (ref 8.9–10.3)
Chloride: 106 mmol/L (ref 98–111)
Creatinine, Ser: 1.35 mg/dL — ABNORMAL HIGH (ref 0.61–1.24)
GFR, Estimated: 54 mL/min — ABNORMAL LOW
Glucose, Bld: 163 mg/dL — ABNORMAL HIGH (ref 70–99)
Potassium: 4.8 mmol/L (ref 3.5–5.1)
Sodium: 134 mmol/L — ABNORMAL LOW (ref 135–145)

## 2022-09-11 LAB — CBC
HCT: 43 % (ref 39.0–52.0)
Hemoglobin: 14.2 g/dL (ref 13.0–17.0)
MCH: 29.6 pg (ref 26.0–34.0)
MCHC: 33 g/dL (ref 30.0–36.0)
MCV: 89.6 fL (ref 80.0–100.0)
Platelets: 230 K/uL (ref 150–400)
RBC: 4.8 MIL/uL (ref 4.22–5.81)
RDW: 12.8 % (ref 11.5–15.5)
WBC: 9.3 K/uL (ref 4.0–10.5)
nRBC: 0 % (ref 0.0–0.2)

## 2022-09-11 LAB — MAGNESIUM: Magnesium: 1.5 mg/dL — ABNORMAL LOW (ref 1.7–2.4)

## 2022-09-11 MED ORDER — MAGNESIUM SULFATE 2 GM/50ML IV SOLN
2.0000 g | Freq: Once | INTRAVENOUS | Status: AC
  Administered 2022-09-11: 2 g via INTRAVENOUS
  Filled 2022-09-11: qty 50

## 2022-09-11 MED ORDER — POLYETHYLENE GLYCOL 3350 17 G PO PACK
34.0000 g | PACK | Freq: Two times a day (BID) | ORAL | Status: DC
  Administered 2022-09-11 – 2022-09-15 (×5): 34 g via ORAL
  Filled 2022-09-11 (×8): qty 2

## 2022-09-11 NOTE — Progress Notes (Signed)
[facility]  Daily Progress Note   Subjective  -   Having nerve pain in the right foot.  Incisional pain present as well.  No major events overnight.    Objective Vitals:   09/11/22 1000 09/11/22 1100 09/11/22 1130 09/11/22 1200  BP: (!) 160/67 101/65 (!) 125/47 (!) 130/115  Pulse: 87 80 67 67  Resp: 16 15 15  (!) 21  Temp:      TempSrc:      SpO2: 97% 96% 99% 100%  Weight:      Height:        Intake/Output Summary (Last 24 hours) at 09/11/2022 1345 Last data filed at 09/11/2022 1200 Gross per 24 hour  Intake 1622 ml  Output 1030 ml  Net 592 ml    PULM  CTAB CV  RRR VASC  Good doppler signals in right foot, 1+ palpable pulses left foot.   Laboratory CBC    Component Value Date/Time   WBC 9.3 09/11/2022 0501   HGB 14.2 09/11/2022 0501   HGB 14.0 06/13/2019 1612   HCT 43.0 09/11/2022 0501   HCT 41.5 06/13/2019 1612   PLT 230 09/11/2022 0501   PLT 258 06/13/2019 1612    BMET    Component Value Date/Time   NA 134 (L) 09/11/2022 0501   NA 134 06/18/2020 1556   K 4.8 09/11/2022 0501   CL 106 09/11/2022 0501   CO2 19 (L) 09/11/2022 0501   GLUCOSE 163 (H) 09/11/2022 0501   BUN 44 (H) 09/11/2022 0501   BUN 21 06/18/2020 1556   CREATININE 1.35 (H) 09/11/2022 0501   CALCIUM 8.9 09/11/2022 0501   GFRNONAA 54 (L) 09/11/2022 0501   GFRAA 66 06/18/2020 1556    Assessment/Planning: POD # 1 s/p bilateral femoral endarterectomies and bilateral iliac stent placements for rest pain  Doing fairly well.  Having expected incisional pain and reperfusion pain on the right. Feet are warm.  Good Doppler signals on the right with palpable pulses on the left.  Has known SFA disease on the right but this is not going to create a limb threatening situation. No signs of compartment syndrome Okay to give regular diet, ambulate, and move out to the floor Aspirin and Plavix and statin agent.    Festus Barren  09/11/2022, 1:45 PM

## 2022-09-11 NOTE — Anesthesia Postprocedure Evaluation (Signed)
Anesthesia Post Note  Patient: William Ball  Procedure(s) Performed: ENDARTERECTOMY FEMORAL (Bilateral) INSERTION OF ILIAC STENT (Bilateral) APPLICATION OF CELL SAVER (Bilateral)  Patient location during evaluation: SICU Anesthesia Type: General Level of consciousness: awake Pain management: pain level controlled Vital Signs Assessment: post-procedure vital signs reviewed and stable Respiratory status: spontaneous breathing Cardiovascular status: stable Postop Assessment: no apparent nausea or vomiting Anesthetic complications: no   There were no known notable events for this encounter.   Last Vitals:  Vitals:   09/11/22 0600 09/11/22 0700  BP: (!) 152/54 (!) 158/51  Pulse: 77 78  Resp: 15 14  Temp:    SpO2: 97% 96%    Last Pain:  Vitals:   09/11/22 0500  TempSrc: Oral  PainSc:                  Rosanne Gutting

## 2022-09-11 NOTE — Evaluation (Signed)
Physical Therapy Evaluation Patient Details Name: William Ball MRN: [id number] DOB: 07-Feb-1946 Today's Date: 09/11/2022  History of Present Illness  77 year old male with history of PAD, hypertension, hyperlipidemia, non-insulin-dependent diabetes mellitus, who presents to ED with right limb pain. Now s/p bilateral femoral endarterectomies, angioplasties, and stents placement on 09/10/22.   Clinical Impression  Patient alert, agreeable to PT with some encouragement, reported pain was significant in bilateral groins. At baseline the pt reported he is independent, lives with his wife.   The patient required minA and extended time. The patient was able to sit with fair balance. Sit <> stand with RW and minA, step pivot with RW and minA (x2 for safety but not required). Pt unable to ambulate due to pain, weakness, and reported dizziness (vitals WFLs).  Overall the patient demonstrated deficits (see "PT Problem List") that impede the patient's functional abilities, safety, and mobility and would benefit from skilled PT intervention. Recommendation is to continue skilled PT services to maximize function and improve mobility.        Recommendations for follow up therapy are one component of a multi-disciplinary discharge planning process, led by the attending physician.  Recommendations may be updated based on patient status, additional functional criteria and insurance authorization.  Follow Up Recommendations Can patient physically be transported by private vehicle: Yes     Assistance Recommended at Discharge Frequent or constant Supervision/Assistance  Patient can return home with the following  A lot of help with bathing/dressing/bathroom;Assistance with cooking/housework;Direct supervision/assist for medications management;Assist for transportation;Help with stairs or ramp for entrance;A lot of help with walking and/or transfers    Equipment Recommendations Rolling walker (2 wheels)   Recommendations for Other Services       Functional Status Assessment Patient has had a recent decline in their functional status and demonstrates the ability to make significant improvements in function in a reasonable and predictable amount of time.     Precautions / Restrictions Precautions Precautions: Fall Restrictions Weight Bearing Restrictions: No      Mobility  Bed Mobility Overal bed mobility: Needs Assistance Bed Mobility: Supine to Sit     Supine to sit: HOB elevated, Min assist     General bed mobility comments: effortful, limited by pain    Transfers Overall transfer level: Needs assistance Equipment used: Rolling walker (2 wheels) Transfers: Sit to/from Stand, Bed to chair/wheelchair/BSC Sit to Stand: Min assist   Step pivot transfers: Min assist            Ambulation/Gait               General Gait Details: unable to truly ambulate due to pain  Stairs            Wheelchair Mobility    Modified Rankin (Stroke Patients Only)       Balance Overall balance assessment: Needs assistance Sitting-balance support: Feet supported Sitting balance-Leahy Scale: Fair     Standing balance support: Reliant on assistive device for balance Standing balance-Leahy Scale: Poor                               Pertinent Vitals/Pain Pain Assessment Pain Assessment: Faces Faces Pain Scale: Hurts worst Pain Location: bilateral hip, groin, abdomen Pain Descriptors / Indicators: Aching, Guarding, Grimacing, Pressure, Sore Pain Intervention(s): Limited activity within patient's tolerance, Monitored during session, Repositioned    Home Living Family/patient expects to be discharged to:: Private residence Living Arrangements: Spouse/significant other Available  Help at Discharge: Family Type of Home: House Home Access: Stairs to enter   [employer] of Steps: 5   Home Layout: One level Home Equipment: Agricultural consultant (2  wheels);Cane - single point      Prior Function Prior Level of Function : Independent/Modified Independent                     Hand Dominance        Extremity/Trunk Assessment   Upper Extremity Assessment Upper Extremity Assessment: Generalized weakness    Lower Extremity Assessment Lower Extremity Assessment: Generalized weakness    Cervical / Trunk Assessment Cervical / Trunk Assessment: Normal  Communication   Communication: No difficulties  Cognition Arousal/Alertness: Awake/alert Behavior During Therapy: WFL for tasks assessed/performed Overall Cognitive Status: Within Functional Limits for tasks assessed                                          General Comments      Exercises     Assessment/Plan    PT Assessment Patient needs continued PT services  PT Problem List Decreased strength;Decreased mobility;Decreased range of motion;Decreased activity tolerance;Decreased balance;Pain       PT Treatment Interventions DME instruction;Therapeutic activities;Gait training;Therapeutic exercise;Patient/family education;Balance training;Stair training;Functional mobility training;Neuromuscular re-education    PT Goals (Current goals can be found in the Care Plan section)  Acute Rehab PT Goals Patient Stated Goal: to have less pain PT Goal Formulation: With patient Time For Goal Achievement: 09/25/22 Potential to Achieve Goals: Good    Frequency Min 4X/week     Co-evaluation               AM-PAC PT "6 Clicks" Mobility  Outcome Measure Help needed turning from your back to your side while in a flat bed without using bedrails?: A Little Help needed moving from lying on your back to sitting on the side of a flat bed without using bedrails?: A Lot Help needed moving to and from a bed to a chair (including a wheelchair)?: A Little Help needed standing up from a chair using your arms (e.g., wheelchair or bedside chair)?: A Little Help  needed to walk in hospital room?: A Lot Help needed climbing 3-5 steps with a railing? : A Lot 6 Click Score: 15    End of Session   Activity Tolerance: Patient limited by pain Patient left: in chair;with call bell/phone within reach;with family/visitor present Nurse Communication: Mobility status PT Visit Diagnosis: Other abnormalities of gait and mobility (R26.89);Muscle weakness (generalized) (M62.81);Pain Pain - Right/Left:  (bilateral) Pain - part of body: Leg    Time: 0981-1914 PT Time Calculation (min) (ACUTE ONLY): 32 min   Charges:   PT Evaluation $PT Eval Low Complexity: 1 Low PT Treatments $Therapeutic Activity: 23-37 mins        Olga Coaster PT, DPT 11:41 AM,09/11/22

## 2022-09-11 NOTE — Progress Notes (Signed)
  PROGRESS NOTE    MASARU HALER  ZOX:[id number] DOB: 1946-01-10 DOA: 09/03/2022 PCP: Luciana Axe, NP  IC14A/[id number]-AA  LOS: 8 days   Brief hospital course:   Assessment & Plan: [name] is a 77 year old male with history of PAD, hypertension, hyperlipidemia, non-insulin-dependent diabetes mellitus, who presents to emergency department for chief concerns of right limb pain. Patient is placed on heparin drip for leg ischemia, seen by vascular surgery, planning for intervention on 5/8  Acute lower limb ischemia Peripheral arterial disease. Tobacco abuse. S/p femoral endarterectomy on 09/10/22 Patient advised to quit smoking. Plan: --cont wound vac  --cont ASA, plavix and statin  Constipation from pain medicine. --miralax scheduled   Alcohol use No evidence of withdrawal.     Hypertension associated with diabetes (HCC) --cont coreg and irbesartan   Aortic atherosclerosis (HCC) --cont ASA, plavix and statin   BPH (benign prostatic hyperplasia) Terazosin 2 mg nightly   Hyperlipidemia --cont statin    DVT prophylaxis: Lovenox SQ Code Status: Full code  Family Communication:  Level of care: Med-Surg Dispo:   The patient is from: home Anticipated d/c is to: home Anticipated d/c date is: 2-3 days   Subjective and Interval History:  Pt reported intermittent pain shooting down right leg.   Objective: Vitals:   09/11/22 1600 09/11/22 1630 09/11/22 1700 09/11/22 1800  BP: (!) 162/47  (!) 167/54 (!) 156/52  Pulse: 68 97 75 73  Resp: 18 20 16  (!) 21  Temp:      TempSrc:      SpO2: 96% 100% 97% 95%  Weight:      Height:        Intake/Output Summary (Last 24 hours) at 09/11/2022 1845 Last data filed at 09/11/2022 1600 Gross per 24 hour  Intake 1738.98 ml  Output 700 ml  Net 1038.98 ml   Filed Weights   09/03/22 1600 09/10/22 0947  Weight: 85.3 kg 83 kg    Examination:   Constitutional: NAD, AAOx3 HEENT: conjunctivae and lids normal, EOMI CV: No  cyanosis.   RESP: normal respiratory effort, on RA Extremities: wound vac over both groins with good seal. SKIN: warm, dry Neuro: II - XII grossly intact.   Psych: Normal mood and affect.  Appropriate judgement and reason   Data Reviewed: I have personally reviewed labs and imaging studies  Time spent: 35 minutes  Darlin Priestly, MD [facility] If 7PM-7AM, please contact night-coverage 09/11/2022, 6:45 PM

## 2022-09-11 NOTE — Evaluation (Signed)
Occupational Therapy Evaluation Patient Details Name: William Ball MRN: [id number] DOB: 04/07/1946 Today's Date: 09/11/2022   History of Present Illness 77 year old male with history of PAD, hypertension, hyperlipidemia, non-insulin-dependent diabetes mellitus, who presents to ED with right limb pain. Now s/p bilateral femoral endarterectomies, angioplasties, and stents placement on 09/10/22.   Clinical Impression   Pt was seen for OT evaluation this date. Prior to hospital admission, pt was independent and is eager to return to PLOF. Pt lives with his spouse who will be able to assist upon discharge. Pt presents to acute OT demonstrating impaired ADL performance and functional mobility 2/2 significant bilateral groin pain from vascular procedure and impaired strength, balance, and activity tolerance (See OT problem list). Pt currently requires CGA-MIN A for ADL mobility (limited in distance 2/2 pain, heavy reliance on RW), and MIN-MOD A for LB ADL tasks. Pt would benefit from skilled OT services to address noted impairments and functional limitations (see below for any additional details) in order to maximize safety and independence while minimizing falls risk and caregiver burden.     Recommendations for follow up therapy are one component of a multi-disciplinary discharge planning process, led by the attending physician.  Recommendations may be updated based on patient status, additional functional criteria and insurance authorization.   Assistance Recommended at Discharge Frequent or constant Supervision/Assistance  Patient can return home with the following A little help with walking and/or transfers;A little help with bathing/dressing/bathroom;Assistance with cooking/housework;Assist for transportation;Help with stairs or ramp for entrance    Functional Status Assessment  Patient has had a recent decline in their functional status and demonstrates the ability to make significant improvements in  function in a reasonable and predictable amount of time.  Equipment Recommendations  BSC/3in1    Recommendations for Other Services       Precautions / Restrictions Precautions Precautions: Fall Restrictions Weight Bearing Restrictions: No      Mobility Bed Mobility Overal bed mobility: Needs Assistance Bed Mobility: Sit to Supine       Sit to supine: Min assist, HOB elevated   General bed mobility comments: MIN A for BLE mgt 2/2 pain    Transfers Overall transfer level: Needs assistance Equipment used: Rolling walker (2 wheels) Transfers: Sit to/from Stand, Bed to chair/wheelchair/BSC Sit to Stand: Min assist     Step pivot transfers: Min guard            Balance Overall balance assessment: Needs assistance Sitting-balance support: Feet supported Sitting balance-Leahy Scale: Good     Standing balance support: Reliant on assistive device for balance, Bilateral upper extremity supported, During functional activity Standing balance-Leahy Scale: Poor                             ADL either performed or assessed with clinical judgement   ADL Overall ADL's : Needs assistance/impaired                                       General ADL Comments: Pt requires MIN-MOD A for LB ADL tasks 2/2 pain with bending. Spouse able to provide. MIN A for toilet transfer with RW. Anticipate improvement with pain control.     Vision         Perception     Praxis      Pertinent Vitals/Pain Pain Assessment Pain Assessment: 0-10 Pain Score: 5  Pain Location: bilateral hip, groin, abdomen Pain Descriptors / Indicators: Guarding, Grimacing, Pressure, Sore Pain Intervention(s): Limited activity within patient's tolerance, Premedicated before session, Monitored during session, Repositioned     Hand Dominance     Extremity/Trunk Assessment Upper Extremity Assessment Upper Extremity Assessment: Generalized weakness   Lower Extremity  Assessment Lower Extremity Assessment: Generalized weakness   Cervical / Trunk Assessment Cervical / Trunk Assessment: Normal   Communication Communication Communication: No difficulties   Cognition Arousal/Alertness: Awake/alert Behavior During Therapy: WFL for tasks assessed/performed Overall Cognitive Status: Within Functional Limits for tasks assessed                                       General Comments       Exercises Other Exercises Other Exercises: Pt/spouse educated in AE/DME and home/routines modifications for bathing, dressing, and toileting to maximize safety/indep and comfort as pt is fairly pain limited this date.   Shoulder Instructions      Home Living Family/patient expects to be discharged to:: Private residence Living Arrangements: Spouse/significant other Available Help at Discharge: Family Type of Home: House Home Access: Stairs to enter Secretary/administrator of Steps: 5   Home Layout: One level     Bathroom Shower/Tub: Chief Strategy Officer: Standard     Home Equipment: Agricultural consultant (2 wheels);Cane - single point;Shower seat;Hand held shower head;Adaptive equipment Adaptive Equipment: Reacher        Prior Functioning/Environment Prior Level of Function : Independent/Modified Independent                        OT Problem List: Decreased strength;Pain;Impaired balance (sitting and/or standing);Decreased knowledge of use of DME or AE      OT Treatment/Interventions: Self-care/ADL training;Therapeutic exercise;Therapeutic activities;DME and/or AE instruction;Patient/family education;Balance training    OT Goals(Current goals can be found in the care plan section) Acute Rehab OT Goals Patient Stated Goal: go home with less pain OT Goal Formulation: With patient/family Time For Goal Achievement: 09/25/22 Potential to Achieve Goals: Good ADL Goals Pt Will Perform Lower Body Dressing: with modified  independence;sit to/from stand Pt Will Transfer to Toilet: with modified independence;ambulating (LRAD) Pt Will Perform Toileting - Clothing Manipulation and hygiene: with modified independence  OT Frequency: Min 2X/week    Co-evaluation              AM-PAC OT "6 Clicks" Daily Activity     Outcome Measure Help from another person eating meals?: None Help from another person taking care of personal grooming?: None Help from another person toileting, which includes using toliet, bedpan, or urinal?: A Little Help from another person bathing (including washing, rinsing, drying)?: A Little Help from another person to put on and taking off regular upper body clothing?: None Help from another person to put on and taking off regular lower body clothing?: A Little 6 Click Score: 21   End of Session Equipment Utilized During Treatment: Rolling walker (2 wheels)  Activity Tolerance: Patient limited by pain;Patient tolerated treatment well Patient left: in bed;with call bell/phone within reach;with family/visitor present  OT Visit Diagnosis: Other abnormalities of gait and mobility (R26.89);Pain Pain - Right/Left:  (both) Pain - part of body:  (groin)                Time: 1610-9604 OT Time Calculation (min): 17 min Charges:  OT General Charges $OT Visit:  1 Visit OT Evaluation $OT Eval Low Complexity: 1 Low OT Treatments $Self Care/Home Management : 8-22 mins  Arman Filter., MPH, MS, OTR/L ascom [phone] 09/11/22, 12:23 PM

## 2022-09-11 NOTE — Progress Notes (Signed)
Progress Note    09/11/2022 3:21 PM 1 Day Post-Op  Subjective:  [name] is a 77 year old male with history of PAD, hypertension, hyperlipidemia, non-insulin-dependent diabetes mellitus, who presents to emergency department for chief concerns of right limb pain.   He is now POD #1 from  Left common femoral and superficial femoral artery endarterectomies with bovine pericardial patch angioplasty and Right common femoral and profunda femoris endarterectomies with bovine pericardial patch angioplasty and Aortogram with iliofemoral angiogram with bilateral common iliac artery stent placements.  Patient resting and recovering as expected this morning in bed in ICU. Patient endorses right lower extremity pain. Denies any left lower extremity pain. Patient endorses both lower extremities feel warmer today. No complaints overnight. Vitals all remain stable.      Vitals:   09/11/22 1400 09/11/22 1500  BP: (!) 132/52 (!) 155/49  Pulse: 70 74  Resp: (!) 22 17  Temp:    SpO2: 97% 97%   Physical Exam: Cardiac:  RRR, Normal S1, S2  Lungs:  Clear on auscultation throughout, normal respiratory effort, scattered rhonchi with post operative atelectases cough. Incisions:  Bilateral groin incisions with prevena wound vac working well with dressing clean dry and intact. No hematoma seroma to note.  Extremities:   Strong doppler pulses to left and right lower extremities,  Abdomen:  Positive bowel sounds, soft, NT/ND, no masses  Neurologic: A&O X 3; No focal weakness or paresthesias are detected; speech is fluent/normal.   CBC    Component Value Date/Time   WBC 9.3 09/11/2022 0501   RBC 4.80 09/11/2022 0501   HGB 14.2 09/11/2022 0501   HGB 14.0 06/13/2019 1612   HCT 43.0 09/11/2022 0501   HCT 41.5 06/13/2019 1612   PLT 230 09/11/2022 0501   PLT 258 06/13/2019 1612   MCV 89.6 09/11/2022 0501   MCV 92 06/13/2019 1612   MCH 29.6 09/11/2022 0501   MCHC 33.0 09/11/2022 0501   RDW 12.8  09/11/2022 0501   RDW 12.8 06/13/2019 1612   LYMPHSABS 3.7 09/03/2022 1610   LYMPHSABS 2.1 06/13/2019 1612   MONOABS 0.8 09/03/2022 1610   EOSABS 0.8 (H) 09/03/2022 1610   EOSABS 0.6 (H) 06/13/2019 1612   BASOSABS 0.1 09/03/2022 1610   BASOSABS 0.1 06/13/2019 1612    BMET    Component Value Date/Time   NA 134 (L) 09/11/2022 0501   NA 134 06/18/2020 1556   K 4.8 09/11/2022 0501   CL 106 09/11/2022 0501   CO2 19 (L) 09/11/2022 0501   GLUCOSE 163 (H) 09/11/2022 0501   BUN 44 (H) 09/11/2022 0501   BUN 21 06/18/2020 1556   CREATININE 1.35 (H) 09/11/2022 0501   CALCIUM 8.9 09/11/2022 0501   GFRNONAA 54 (L) 09/11/2022 0501   GFRAA 66 06/18/2020 1556    INR    Component Value Date/Time   INR 1.0 09/03/2022 1610     Intake/Output Summary (Last 24 hours) at 09/11/2022 1521 Last data filed at 09/11/2022 1500 Gross per 24 hour  Intake 1970.76 ml  Output 580 ml  Net 1390.76 ml     Assessment/Plan:  77 y.o. male is s/p bilateral femoral endarterectomies with stent placement 1 Day Post-Op   PLAN: Regular diet advance as tolerated. Pain control OOB with assist around unit and sit up in chair today.  Incentive spirometry/ Flutter valve.  Prevena wound vacs X 7 days.  Discontinue Foley Catheter and continue to monitor Urine Output Will need ICU for tonight.  DVT prophylaxis:  ASA  81 mg Daily, Plavix 75 mg Daily, Lovenox 40 mg [place] Q24   William Ball [facility] 09/11/2022 3:21 PM

## 2022-09-12 ENCOUNTER — Encounter: Payer: Self-pay | Admitting: Vascular Surgery

## 2022-09-12 DIAGNOSIS — I998 Other disorder of circulatory system: Secondary | ICD-10-CM | POA: Diagnosis not present

## 2022-09-12 LAB — CBC
HCT: 35.5 % — ABNORMAL LOW (ref 39.0–52.0)
Hemoglobin: 11.7 g/dL — ABNORMAL LOW (ref 13.0–17.0)
MCH: 30 pg (ref 26.0–34.0)
MCHC: 33 g/dL (ref 30.0–36.0)
MCV: 91 fL (ref 80.0–100.0)
Platelets: 209 K/uL (ref 150–400)
RBC: 3.9 MIL/uL — ABNORMAL LOW (ref 4.22–5.81)
RDW: 12.9 % (ref 11.5–15.5)
WBC: 10.3 K/uL (ref 4.0–10.5)
nRBC: 0 % (ref 0.0–0.2)

## 2022-09-12 LAB — BASIC METABOLIC PANEL WITH GFR
Anion gap: 6 (ref 5–15)
BUN: 40 mg/dL — ABNORMAL HIGH (ref 8–23)
CO2: 22 mmol/L (ref 22–32)
Calcium: 9 mg/dL (ref 8.9–10.3)
Chloride: 108 mmol/L (ref 98–111)
Creatinine, Ser: 1.39 mg/dL — ABNORMAL HIGH (ref 0.61–1.24)
GFR, Estimated: 52 mL/min — ABNORMAL LOW
Glucose, Bld: 180 mg/dL — ABNORMAL HIGH (ref 70–99)
Potassium: 4.3 mmol/L (ref 3.5–5.1)
Sodium: 136 mmol/L (ref 135–145)

## 2022-09-12 LAB — GLUCOSE, CAPILLARY
Glucose-Capillary: 152 mg/dL — ABNORMAL HIGH (ref 70–99)
Glucose-Capillary: 194 mg/dL — ABNORMAL HIGH (ref 70–99)
Glucose-Capillary: 159 mg/dL — ABNORMAL HIGH (ref 70–99)
Glucose-Capillary: 169 mg/dL — ABNORMAL HIGH (ref 70–99)

## 2022-09-12 LAB — SURGICAL PATHOLOGY

## 2022-09-12 LAB — MAGNESIUM: Magnesium: 2.1 mg/dL (ref 1.7–2.4)

## 2022-09-12 MED ORDER — OXYCODONE-ACETAMINOPHEN 5-325 MG PO TABS
1.0000 | ORAL_TABLET | ORAL | Status: DC | PRN
  Administered 2022-09-12 – 2022-09-14 (×7): 2 via ORAL
  Administered 2022-09-14: 1 via ORAL
  Administered 2022-09-15 (×4): 2 via ORAL
  Filled 2022-09-12 (×12): qty 2

## 2022-09-12 MED ORDER — MORPHINE SULFATE (PF) 2 MG/ML IV SOLN
1.0000 mg | INTRAVENOUS | Status: DC | PRN
  Administered 2022-09-12 – 2022-09-13 (×4): 1 mg via INTRAVENOUS
  Filled 2022-09-12 (×4): qty 1

## 2022-09-12 NOTE — Progress Notes (Signed)
Progress Note    09/12/2022 1:26 PM 2 Days Post-Op  Subjective:  Mr. [place] Rebeck is a 77 year old male with history of PAD, hypertension, hyperlipidemia, non-insulin-dependent diabetes mellitus, who presents to emergency department for chief concerns of right limb pain.    He is now POD #2 from  Left common femoral and superficial femoral artery endarterectomies with bovine pericardial patch angioplasty and Right common femoral and profunda femoris endarterectomies with bovine pericardial patch angioplasty and Aortogram with iliofemoral angiogram with bilateral common iliac artery stent placements.   Patient resting and recovering as expected this morning in bed in ICU. Patient endorses right lower extremity pain. Denies any left lower extremity pain. Patient endorses both lower extremities feel warmer today. No complaints overnight. Vitals all remain stable.      Vitals:   09/12/22 0900 09/12/22 1103  BP: (!) 149/46 (!) 160/62  Pulse: 70 71  Resp: (!) 25 18  Temp:  98.5 F (36.9 C)  SpO2: 99% 98%   Physical Exam: Cardiac:  RRR, Normal S1, S2  Lungs:  Clear on auscultation throughout, normal respiratory effort, scattered rhonchi with post operative atelectases cough. Incisions:  Bilateral groin incisions with prevena wound vac working well with dressing clean dry and intact. No hematoma seroma to note.  Extremities:   Strong doppler pulses to left and right lower extremities,  Abdomen:  Positive bowel sounds, soft, NT/ND, no masses  Neurologic: A&O X 3; No focal weakness or paresthesias are detected; speech is fluent/normal.  CBC    Component Value Date/Time   WBC 10.3 09/12/2022 0500   RBC 3.90 (L) 09/12/2022 0500   HGB 11.7 (L) 09/12/2022 0500   HGB 14.0 06/13/2019 1612   HCT 35.5 (L) 09/12/2022 0500   HCT 41.5 06/13/2019 1612   PLT 209 09/12/2022 0500   PLT 258 06/13/2019 1612   MCV 91.0 09/12/2022 0500   MCV 92 06/13/2019 1612   MCH 30.0 09/12/2022 0500   MCHC 33.0  09/12/2022 0500   RDW 12.9 09/12/2022 0500   RDW 12.8 06/13/2019 1612   LYMPHSABS 3.7 09/03/2022 1610   LYMPHSABS 2.1 06/13/2019 1612   MONOABS 0.8 09/03/2022 1610   EOSABS 0.8 (H) 09/03/2022 1610   EOSABS 0.6 (H) 06/13/2019 1612   BASOSABS 0.1 09/03/2022 1610   BASOSABS 0.1 06/13/2019 1612    BMET    Component Value Date/Time   NA 136 09/12/2022 0500   NA 134 06/18/2020 1556   K 4.3 09/12/2022 0500   CL 108 09/12/2022 0500   CO2 22 09/12/2022 0500   GLUCOSE 180 (H) 09/12/2022 0500   BUN 40 (H) 09/12/2022 0500   BUN 21 06/18/2020 1556   CREATININE 1.39 (H) 09/12/2022 0500   CALCIUM 9.0 09/12/2022 0500   GFRNONAA 52 (L) 09/12/2022 0500   GFRAA 66 06/18/2020 1556    INR    Component Value Date/Time   INR 1.0 09/03/2022 1610     Intake/Output Summary (Last 24 hours) at 09/12/2022 1326 Last data filed at 09/12/2022 0856 Gross per 24 hour  Intake 393.93 ml  Output 1870 ml  Net -1476.07 ml     Assessment/Plan:  77 y.o. male is s/p bilateral femoral endarterectomies with stent placement  2 Days Post-Op   PLAN: Regular diet advance as tolerated. Pain control Transfer to floor today OOB with assist around unit and sit up in chair today.  Incentive spirometry/ Flutter valve.  Prevena wound vacs X 7 days.   DVT prophylaxis:  ASA 81 mg Daily, Plavix  75 mg Daily, Lovenox 40 mg [place] Q24    Latravis Grine R Armani [facility] 09/12/2022 1:26 PM

## 2022-09-12 NOTE — Progress Notes (Signed)
Physical Therapy Treatment Patient Details Name: William Ball MRN: [id number] DOB: 1945/08/20 Today's Date: 09/12/2022   History of Present Illness 77 year old male with history of PAD, hypertension, hyperlipidemia, non-insulin-dependent diabetes mellitus, who presents to ED with right limb pain. Now s/p bilateral femoral endarterectomies, angioplasties, and stents placement on 09/10/22.    PT Comments    Patient is agreeable to PT. He reports having pain medication earlier. He continues to require assistance with bed mobility. 2 standing bouts performed with cues for technique. Patient ambulated in room today with cues for sequencing of BLE and rolling walker. Patient does appear to have dorsiflexion weakness on the right. Increased standing activity tolerance this session. Recommend to continue PT to maximize independence and decrease caregiver burden.    Recommendations for follow up therapy are one component of a multi-disciplinary discharge planning process, led by the attending physician.  Recommendations may be updated based on patient status, additional functional criteria and insurance authorization.  Follow Up Recommendations  Can patient physically be transported by private vehicle: Yes    Assistance Recommended at Discharge Frequent or constant Supervision/Assistance  Patient can return home with the following A lot of help with bathing/dressing/bathroom;Assistance with cooking/housework;Direct supervision/assist for medications management;Assist for transportation;Help with stairs or ramp for entrance;A lot of help with walking and/or transfers   Equipment Recommendations  Rolling walker (2 wheels)    Recommendations for Other Services       Precautions / Restrictions Precautions Precautions: Fall Precaution Comments: would vac x 2 Restrictions Weight Bearing Restrictions: No     Mobility  Bed Mobility Overal bed mobility: Needs Assistance Bed Mobility: Supine to Sit,  Sit to Supine     Supine to sit: Min assist Sit to supine: Min assist   General bed mobility comments: assistance for RLE. verbal cues for technique    Transfers Overall transfer level: Needs assistance Equipment used: Rolling walker (2 wheels) Transfers: Sit to/from Stand Sit to Stand: Min guard, From elevated surface           General transfer comment: 2 standing bouts performed with lifting assistance required.    Ambulation/Gait Ambulation/Gait assistance: Min guard Gait Distance (Feet): 35 Feet Assistive device: Rolling walker (2 wheels) Gait Pattern/deviations: Step-to pattern, Decreased stance time - right, Decreased dorsiflexion - right Gait velocity: decreased   Pre-gait activities: side steps performed with cues for technique using rolling walker prior to ambulation away from the bed. weight acceptance with weight shifting on BLE without knee buckling General Gait Details: education for sequencing of BLE and rolling walker with carry over demonstrated. heavy use of rolling walker for support with ambulation   Stairs             Wheelchair Mobility    Modified Rankin (Stroke Patients Only)       Balance Overall balance assessment: Needs assistance Sitting-balance support: Feet supported Sitting balance-Leahy Scale: Good     Standing balance support: Reliant on assistive device for balance Standing balance-Leahy Scale: Poor Standing balance comment: heavy use of rolling walker for support                            Cognition Arousal/Alertness: Awake/alert Behavior During Therapy: WFL for tasks assessed/performed Overall Cognitive Status: Within Functional Limits for tasks assessed  Exercises General Exercises - Lower Extremity Ankle Circles/Pumps: AROM, Strengthening, Right, 10 reps, Seated Other Exercises Other Exercises: encouraged active dorsiflexion on RLE as patient  reports pain and weakness    General Comments        Pertinent Vitals/Pain Pain Assessment Pain Assessment: Faces Faces Pain Scale: Hurts even more Pain Location: bilateral groin Pain Descriptors / Indicators: Guarding, Grimacing, Sore Pain Intervention(s): Limited activity within patient's tolerance, Monitored during session    Home Living                          Prior Function            PT Goals (current goals can now be found in the care plan section) Acute Rehab PT Goals Patient Stated Goal: to have less pain PT Goal Formulation: With patient Time For Goal Achievement: 09/25/22 Potential to Achieve Goals: Good Progress towards PT goals: Progressing toward goals    Frequency    Min 4X/week      PT Plan Current plan remains appropriate    Co-evaluation              AM-PAC PT "6 Clicks" Mobility   Outcome Measure  Help needed turning from your back to your side while in a flat bed without using bedrails?: A Little Help needed moving from lying on your back to sitting on the side of a flat bed without using bedrails?: A Lot Help needed moving to and from a bed to a chair (including a wheelchair)?: A Little Help needed standing up from a chair using your arms (e.g., wheelchair or bedside chair)?: A Little Help needed to walk in hospital room?: A Little Help needed climbing 3-5 steps with a railing? : A Lot 6 Click Score: 16    End of Session   Activity Tolerance: Patient limited by fatigue;Patient limited by pain Patient left: in bed;with call bell/phone within reach;with bed alarm set   PT Visit Diagnosis: Other abnormalities of gait and mobility (R26.89);Muscle weakness (generalized) (M62.81);Pain     Time: 1235-1318 PT Time Calculation (min) (ACUTE ONLY): 43 min  Charges:  $Gait Training: 8-22 mins $Therapeutic Activity: 23-37 mins                     Donna Bernard, PT, MPT    [name] 09/12/2022, 1:30 PM

## 2022-09-12 NOTE — Progress Notes (Signed)
  PROGRESS NOTE    William Ball  ZOX:[id number] DOB: 1946/04/07 DOA: 09/03/2022 PCP: Luciana Axe, NP  106A/106A-BB  LOS: 9 days   Brief hospital course:   Assessment & Plan: [name] is a 77 year old male with history of PAD, hypertension, hyperlipidemia, non-insulin-dependent diabetes mellitus, who presents to emergency department for chief concerns of right limb pain. Patient is placed on heparin drip for leg ischemia, seen by vascular surgery, planning for intervention on 5/8  Acute lower limb ischemia Peripheral arterial disease. Tobacco abuse. S/p femoral endarterectomy on 09/10/22 Patient advised to quit smoking. Plan: --cont wound vac for 7 days --cont ASA, plavix and statin  Constipation from pain medicine. --miralax scheduled   Alcohol use No evidence of withdrawal.     Hypertension associated with diabetes (HCC) --cont coreg and irbesartan   Aortic atherosclerosis (HCC) --cont ASA, plavix and statin   BPH (benign prostatic hyperplasia) Terazosin 2 mg nightly   Hyperlipidemia --cont statin    DVT prophylaxis: Lovenox SQ Code Status: Full code  Family Communication:  Level of care: Med-Surg Dispo:   The patient is from: home Anticipated d/c is to: SNF rehab Anticipated d/c date is: whenever bed available   Subjective and Interval History:  Pt had PT today, still having pain and difficult to put weight on his legs.   Objective: Vitals:   09/12/22 0700 09/12/22 0800 09/12/22 0900 09/12/22 1103  BP: (!) 147/52 (!) 154/49 (!) 149/46 (!) 160/62  Pulse: 61 70 70 71  Resp: (!) 26 18 (!) 25 18  Temp:  98.3 F (36.8 C)  98.5 F (36.9 C)  TempSrc:  Oral  Oral  SpO2: 97% 96% 99% 98%  Weight:      Height:        Intake/Output Summary (Last 24 hours) at 09/12/2022 1915 Last data filed at 09/12/2022 0856 Gross per 24 hour  Intake --  Output 1750 ml  Net -1750 ml   Filed Weights   09/03/22 1600 09/10/22 0947  Weight: 85.3 kg 83 kg     Examination:   Constitutional: NAD, AAOx3 HEENT: conjunctivae and lids normal, EOMI CV: No cyanosis.   RESP: normal respiratory effort, on RA Neuro: II - XII grossly intact.   Psych: Normal mood and affect.  Appropriate judgement and reason   Data Reviewed: I have personally reviewed labs and imaging studies  Time spent: 25 minutes  Darlin Priestly, MD [facility] If 7PM-7AM, please contact night-coverage 09/12/2022, 7:15 PM

## 2022-09-13 DIAGNOSIS — I998 Other disorder of circulatory system: Secondary | ICD-10-CM | POA: Diagnosis not present

## 2022-09-13 LAB — CBC
HCT: 33.1 % — ABNORMAL LOW (ref 39.0–52.0)
Hemoglobin: 10.9 g/dL — ABNORMAL LOW (ref 13.0–17.0)
MCH: 29.8 pg (ref 26.0–34.0)
MCHC: 32.9 g/dL (ref 30.0–36.0)
MCV: 90.4 fL (ref 80.0–100.0)
Platelets: 215 K/uL (ref 150–400)
RBC: 3.66 MIL/uL — ABNORMAL LOW (ref 4.22–5.81)
RDW: 12.8 % (ref 11.5–15.5)
WBC: 10.9 K/uL — ABNORMAL HIGH (ref 4.0–10.5)
nRBC: 0 % (ref 0.0–0.2)

## 2022-09-13 LAB — BASIC METABOLIC PANEL WITH GFR
Anion gap: 7 (ref 5–15)
BUN: 36 mg/dL — ABNORMAL HIGH (ref 8–23)
CO2: 21 mmol/L — ABNORMAL LOW (ref 22–32)
Calcium: 9 mg/dL (ref 8.9–10.3)
Chloride: 107 mmol/L (ref 98–111)
Creatinine, Ser: 1.27 mg/dL — ABNORMAL HIGH (ref 0.61–1.24)
GFR, Estimated: 58 mL/min — ABNORMAL LOW
Glucose, Bld: 140 mg/dL — ABNORMAL HIGH (ref 70–99)
Potassium: 4.1 mmol/L (ref 3.5–5.1)
Sodium: 135 mmol/L (ref 135–145)

## 2022-09-13 LAB — GLUCOSE, CAPILLARY
Glucose-Capillary: 146 mg/dL — ABNORMAL HIGH (ref 70–99)
Glucose-Capillary: 177 mg/dL — ABNORMAL HIGH (ref 70–99)
Glucose-Capillary: 107 mg/dL — ABNORMAL HIGH (ref 70–99)
Glucose-Capillary: 140 mg/dL — ABNORMAL HIGH (ref 70–99)

## 2022-09-13 LAB — MAGNESIUM: Magnesium: 1.9 mg/dL (ref 1.7–2.4)

## 2022-09-13 NOTE — Progress Notes (Signed)
    Subjective  - POD # 3, status post bilateral femoral endarterectomy and bilateral iliac stents  Resting comfortably   Physical Exam:  Extremities warm well-perfused       Assessment/Plan:  POD #3  Continue to mobilize Continue aspirin statin and Plavix Hopeful discharge soon  [name] 09/13/2022 3:48 PM --  Vitals:   09/13/22 0921 09/13/22 1201  BP: (!) 171/56 (!) 99/47  Pulse: 61 64  Resp: 16 16  Temp: 98.5 F (36.9 C) 97.8 F (36.6 C)  SpO2: 94% 96%    Intake/Output Summary (Last 24 hours) at 09/13/2022 1548 Last data filed at 09/12/2022 1920 Gross per 24 hour  Intake 240 ml  Output --  Net 240 ml     Laboratory CBC    Component Value Date/Time   WBC 10.9 (H) 09/13/2022 0428   HGB 10.9 (L) 09/13/2022 0428   HGB 14.0 06/13/2019 1612   HCT 33.1 (L) 09/13/2022 0428   HCT 41.5 06/13/2019 1612   PLT 215 09/13/2022 0428   PLT 258 06/13/2019 1612    BMET    Component Value Date/Time   NA 135 09/13/2022 0428   NA 134 06/18/2020 1556   K 4.1 09/13/2022 0428   CL 107 09/13/2022 0428   CO2 21 (L) 09/13/2022 0428   GLUCOSE 140 (H) 09/13/2022 0428   BUN 36 (H) 09/13/2022 0428   BUN 21 06/18/2020 1556   CREATININE 1.27 (H) 09/13/2022 0428   CALCIUM 9.0 09/13/2022 0428   GFRNONAA 58 (L) 09/13/2022 0428   GFRAA 66 06/18/2020 1556    COAG Lab Results  Component Value Date   INR 1.0 09/03/2022   No results found for: "PTT"  Antibiotics Anti-infectives (From admission, onward)    Start     Dose/Rate Route Frequency Ordered Stop   09/10/22 0138  ceFAZolin (ANCEF) IVPB 2g/100 mL premix        2 g 200 mL/hr over 30 Minutes Intravenous 30 min pre-op 09/10/22 0138 09/10/22 1132   09/04/22 1611  ceFAZolin (ANCEF) IVPB 1 g/50 mL premix  Status:  Discontinued        over 30 Minutes  Continuous PRN 09/04/22 1611 09/04/22 1656   09/04/22 1437  ceFAZolin (ANCEF) IVPB 2g/100 mL premix  Status:  Discontinued        2 g 200 mL/hr over 30 Minutes  Intravenous 30 min pre-op 09/04/22 1437 09/04/22 1656        William Ball, M.D., [facility] [place] Office: [phone] Pager:  [phone]

## 2022-09-13 NOTE — Progress Notes (Signed)
  PROGRESS NOTE    William Ball  ZOX:[id number] DOB: 02/17/1946 DOA: 09/03/2022 PCP: Luciana Axe, NP  106A/106A-BB  LOS: 10 days   Brief hospital course:   Assessment & Plan: William Ball is a 77 year old male with history of PAD, hypertension, hyperlipidemia, non-insulin-dependent diabetes mellitus, who presents to emergency department for chief concerns of right limb pain. Patient is placed on heparin drip for leg ischemia, seen by vascular surgery, planning for intervention on 5/8  Acute lower limb ischemia Peripheral arterial disease. Tobacco abuse. S/p bilateral femoral endarterectomy and bilateral iliac stents 09/10/22 Patient advised to quit smoking. Plan: --cont wound vac for 7 days --cont ASA, plavix and statin  Constipation from pain medicine. --miralax scheduled   Alcohol use No evidence of withdrawal.     Hypertension associated with diabetes (HCC) --cont coreg and irbesartan   Aortic atherosclerosis (HCC) --cont ASA, plavix and statin   BPH (benign prostatic hyperplasia) Terazosin 2 mg nightly   Hyperlipidemia --cont statin    DVT prophylaxis: Lovenox SQ Code Status: Full code  Family Communication:  Level of care: Med-Surg Dispo:   The patient is from: home Anticipated d/c is to: SNF rehab Anticipated d/c date is: whenever bed available   Subjective and Interval History:  No new complaint today.   Objective: Vitals:   09/13/22 0447 09/13/22 0921 09/13/22 1201 09/13/22 1602  BP: (!) 153/52 (!) 171/56 (!) 99/47 (!) 121/52  Pulse: 61 61 64 (!) 58  Resp: 16 16 16 14   Temp: 98.4 F (36.9 C) 98.5 F (36.9 C) 97.8 F (36.6 C) 98.7 F (37.1 C)  TempSrc: Oral  Oral   SpO2: 97% 94% 96% 100%  Weight:      Height:       No intake or output data in the 24 hours ending 09/13/22 2005  Filed Weights   09/03/22 1600 09/10/22 0947  Weight: 85.3 kg 83 kg    Examination:   Constitutional: NAD CV: No cyanosis.   RESP: normal  respiratory effort, on RA   Data Reviewed: I have personally reviewed labs and imaging studies  Time spent: 25 minutes  Darlin Priestly, MD [facility] If 7PM-7AM, please contact night-coverage 09/13/2022, 8:05 PM

## 2022-09-13 NOTE — [place] FL2 (Deleted)
 MEDICAID FL2 LEVEL OF CARE FORM     IDENTIFICATION  Patient Name: William Ball Birthdate: 1946/03/31 Sex: male Admission Date (Current Location): 09/03/2022  [facility] and [place] Number:  Chiropodist and Address:  [facility], [address] [place], [place] [address]      Provider Number: [id number]  Attending Physician Name and Address:  Darlin Priestly, MD  Relative Name and Phone Number:  Lonzie, Toran (Spouse) [phone] (Mobile)    Current Level of Care: Hospital Recommended Level of Care: Skilled Nursing Facility Prior Approval Number:    Date Approved/Denied:   PASRR Number: [id number]  Discharge Plan: SNF    Current Diagnoses: Patient Active Problem List   Diagnosis Date Noted   Overweight (BMI 25.0-29.9) 09/06/2022   Hyponatremia 09/05/2022   PVD (peripheral vascular disease) (HCC) 09/04/2022   Acute lower limb ischemia 09/03/2022   Right leg pain 09/03/2022   Alcohol use 09/03/2022   Atherosclerosis of native arteries of extremity with rest pain (HCC) 09/02/2022   Atherosclerosis of native arteries of extremity with intermittent claudication (HCC) 08/31/2022   Diabetes (HCC) 08/31/2022   Hypertension associated with diabetes (HCC) 12/06/2018   CKD (chronic kidney disease) stage 3, GFR 30-59 ml/min (HCC) 02/18/2018   Arterial insufficiency of lower extremity (HCC) 09/23/2017   Emphysema lung (HCC) 08/17/2017   Aortic valve calcification 08/17/2017   Aortic atherosclerosis (HCC) 07/28/2016   Advanced care planning/counseling discussion    First degree hemorrhoids    Personal history of tobacco use, presenting hazards to health 06/25/2016   Smoking greater than 40 pack years 12/05/2015   Senile purpura (HCC) 12/05/2015   Hip arthritis 06/07/2015   BPH (benign prostatic hyperplasia) 06/07/2015   Gastroesophageal reflux 01/22/2015   Hyperlipidemia    Type 2 DM with CKD stage 3 and hypertension (HCC)     Hypertensive nephropathy     Orientation RESPIRATION BLADDER Height & Weight     Self, Time, Situation, Place  Normal Continent Weight: 83 kg Height:  5\' 9"  (175.3 cm)  BEHAVIORAL SYMPTOMS/MOOD NEUROLOGICAL BOWEL NUTRITION STATUS      Continent Diet  AMBULATORY STATUS COMMUNICATION OF NEEDS Skin   Extensive Assist (due to bilateral wound vacs in place in right and left groin/incision sites for 7 days post op.) Verbally (Missing teeth) Wound Vac, Surgical wounds (Bilateral prevana wound vac in right and left groin/incision area.)                       Personal Care Assistance Level of Assistance  Bathing, Dressing Bathing Assistance: Limited assistance   Dressing Assistance: Maximum assistance     Functional Limitations Info             SPECIAL CARE FACTORS FREQUENCY  PT (By licensed PT), OT (By licensed OT)     PT Frequency: 5x/week OT Frequency: 5x/week            Contractures Contractures Info: Not present    Additional Factors Info  Code Status, Allergies Code Status Info: Full Code Allergies Info: Amlodipine           Current Medications (09/13/2022):  This is the current hospital active medication list Current Facility-Administered Medications  Medication Dose Route Frequency Provider Last Rate Last Admin   aspirin EC tablet 81 mg  81 mg Oral Daily Annice Needy, MD   81 mg at 09/13/22 0948   atorvastatin (LIPITOR) tablet 20 mg  20 mg Oral Daily Festus Barren  S, MD   20 mg at 09/13/22 0947   carvedilol (COREG) tablet 3.125 mg  3.125 mg Oral BID Annice Needy, MD   3.125 mg at 09/13/22 0947   clopidogrel (PLAVIX) tablet 75 mg  75 mg Oral Daily Annice Needy, MD   75 mg at 09/13/22 0947   enoxaparin (LOVENOX) injection 40 mg  40 mg Subcutaneous Q24H Darlin Priestly, MD   40 mg at 09/12/22 2230   fentaNYL (SUBLIMAZE) injection 12.5 mcg  12.5 mcg Intravenous Once PRN Annice Needy, MD       folic acid (FOLVITE) tablet 1 mg  1 mg Oral Daily Annice Needy, MD   1 mg at  09/13/22 0947   insulin aspart (novoLOG) injection 0-9 Units  0-9 Units Subcutaneous TID WC Annice Needy, MD   2 Units at 09/13/22 1214   irbesartan (AVAPRO) tablet 300 mg  300 mg Oral Daily Annice Needy, MD   300 mg at 09/13/22 0946   melatonin tablet 5 mg  5 mg Oral QHS Annice Needy, MD   5 mg at 09/12/22 2229   morphine (PF) 2 MG/ML injection 1 mg  1 mg Intravenous Q4H PRN Darlin Priestly, MD   1 mg at 09/13/22 1029   multivitamin with minerals tablet 1 tablet  1 tablet Oral Daily Annice Needy, MD   1 tablet at 09/13/22 0948   nicotine (NICODERM CQ - dosed in mg/24 hours) patch 21 mg  21 mg Transdermal Daily PRN Annice Needy, MD       ondansetron (ZOFRAN) injection 4 mg  4 mg Intravenous Q6H PRN Annice Needy, MD       oxyCODONE-acetaminophen (PERCOCET/ROXICET) 5-325 MG per tablet 1-2 tablet  1-2 tablet Oral Q4H PRN Darlin Priestly, MD   2 tablet at 09/13/22 1424   polyethylene glycol (MIRALAX / GLYCOLAX) packet 34 g  34 g Oral BID Darlin Priestly, MD   34 g at 09/13/22 0946   senna-docusate (Senokot-S) tablet 2 tablet  2 tablet Oral BID Annice Needy, MD   2 tablet at 09/13/22 0946   terazosin (HYTRIN) capsule 2 mg  2 mg Oral QHS Annice Needy, MD   2 mg at 09/12/22 2229   thiamine (VITAMIN B1) tablet 100 mg  100 mg Oral Daily Annice Needy, MD   100 mg at 09/13/22 6045   Or   thiamine (VITAMIN B1) injection 100 mg  100 mg Intravenous Daily Annice Needy, MD         Discharge Medications: Please see discharge summary for a list of discharge medications.  Relevant Imaging Results:  Relevant Lab Results:   Additional Information SS# [id number]  Bing Quarry, RN

## 2022-09-13 NOTE — TOC Progression Note (Signed)
Transition of Care [facility]) - Progression Note    Patient Details  Name: William Ball MRN: [id number] Date of Birth: 06/23/1945  [facility] CM/SW Contact  Bing Quarry, RN Phone Number: 09/13/2022, 4:51 PM  Clinical Narrative: 09/13/22: PT recommendations for STR/SNF at this point in time. Patient is POD # for bilateral femoral endarterectomies with bilateral wound vacs at groin sites. Lives with spouse and when spoke to patient and explained CM/TOC role and STR/choices/process patient deferred till he spoke with vascular surgeon on Monday. He feels he can likely go home with help from spouse or may be here a bit longer with the wound vacs. Explained that even if bed choice made and bed search started, the discharge plan can always change per progress and patient wishes. Patient still wanted to wait to speak to surgeon. Directed to Medicare.gov website for choice review in the meantime/discussion with spouse. PASRR obtained, FL2 completed and signed pending decision. Gabriel Cirri RN CM     Expected Discharge Plan: Home/Self Care Barriers to Discharge: Continued Medical Work up  Expected Discharge Plan and Services     Post Acute Care Choice: NA Living arrangements for the past 2 months: Single Family Home                                       Social Determinants of Health (SDOH) Interventions SDOH Screenings   Food Insecurity: No Food Insecurity (09/04/2022)  Housing: Low Risk  (09/04/2022)  Transportation Needs: No Transportation Needs (09/04/2022)  Utilities: Not At Risk (09/04/2022)  Depression (PHQ2-9): Low Risk  (06/13/2019)  Financial Resource Strain: Low Risk  (08/06/2017)  Physical Activity: Inactive (08/06/2017)  Social Connections: Moderately Isolated (08/06/2017)  Stress: No Stress Concern Present (08/06/2017)  Tobacco Use: High Risk (09/12/2022)    Readmission Risk Interventions     No data to display

## 2022-09-14 DIAGNOSIS — I998 Other disorder of circulatory system: Secondary | ICD-10-CM | POA: Diagnosis not present

## 2022-09-14 LAB — CBC
HCT: 35.9 % — ABNORMAL LOW (ref 39.0–52.0)
Hemoglobin: 12.1 g/dL — ABNORMAL LOW (ref 13.0–17.0)
MCH: 30.6 pg (ref 26.0–34.0)
MCHC: 33.7 g/dL (ref 30.0–36.0)
MCV: 90.7 fL (ref 80.0–100.0)
Platelets: 289 K/uL (ref 150–400)
RBC: 3.96 MIL/uL — ABNORMAL LOW (ref 4.22–5.81)
RDW: 12.7 % (ref 11.5–15.5)
WBC: 9.5 K/uL (ref 4.0–10.5)
nRBC: 0 % (ref 0.0–0.2)

## 2022-09-14 LAB — BASIC METABOLIC PANEL WITH GFR
Anion gap: 10 (ref 5–15)
BUN: 41 mg/dL — ABNORMAL HIGH (ref 8–23)
CO2: 20 mmol/L — ABNORMAL LOW (ref 22–32)
Calcium: 9.4 mg/dL (ref 8.9–10.3)
Chloride: 104 mmol/L (ref 98–111)
Creatinine, Ser: 1.34 mg/dL — ABNORMAL HIGH (ref 0.61–1.24)
GFR, Estimated: 55 mL/min — ABNORMAL LOW
Glucose, Bld: 137 mg/dL — ABNORMAL HIGH (ref 70–99)
Potassium: 4 mmol/L (ref 3.5–5.1)
Sodium: 134 mmol/L — ABNORMAL LOW (ref 135–145)

## 2022-09-14 LAB — GLUCOSE, CAPILLARY
Glucose-Capillary: 149 mg/dL — ABNORMAL HIGH (ref 70–99)
Glucose-Capillary: 143 mg/dL — ABNORMAL HIGH (ref 70–99)
Glucose-Capillary: 150 mg/dL — ABNORMAL HIGH (ref 70–99)
Glucose-Capillary: 177 mg/dL — ABNORMAL HIGH (ref 70–99)

## 2022-09-14 LAB — MAGNESIUM: Magnesium: 2 mg/dL (ref 1.7–2.4)

## 2022-09-14 NOTE — Progress Notes (Signed)
PT Cancellation Note  Patient Details Name: William Ball MRN: [id number] DOB: 1945-07-02   Cancelled Treatment:    Reason Eval/Treat Not Completed: Other (comment). Pt in recliner upon arrival and reports he has been OOB for over 3 hours. Reports pain is beginning to worsen and requests pain meds. Called RN and informed. Pt rating at 5/10. Reports wound vac removed earlier and asks to remain in chair. Will re-attempt at another time.   Lycan Davee 09/14/2022, 2:43 PM Elizabeth Palau, PT, DPT, GCS [phone]

## 2022-09-14 NOTE — Progress Notes (Signed)
.     Subjective  - POD #43, status post bilateral femoral endarterectomy and bilateral iliac stents   Right foot  feels better this am   Physical Exam:  Motor and sensory function intact on both legs Provena vacs not functioning properly, so I removed them Compartments soft on both legs       Assessment/Plan:  POD #4  Needs to be more active Keep groins dry, now that Provena vacs removed ASA/Statin/Plavix  Wells Kalista Laguardia 09/14/2022 11:59 AM --  Vitals:   09/14/22 0510 09/14/22 0802  BP: (!) 144/47 (!) 153/57  Pulse: (!) 57 (!) 56  Resp: 18 16  Temp: 98 F (36.7 C) 98.4 F (36.9 C)  SpO2: 95% 97%    Intake/Output Summary (Last 24 hours) at 09/14/2022 1159 Last data filed at 09/14/2022 1009 Gross per 24 hour  Intake 240 ml  Output 350 ml  Net -110 ml     Laboratory CBC    Component Value Date/Time   WBC 9.5 09/14/2022 0422   HGB 12.1 (L) 09/14/2022 0422   HGB 14.0 06/13/2019 1612   HCT 35.9 (L) 09/14/2022 0422   HCT 41.5 06/13/2019 1612   PLT 289 09/14/2022 0422   PLT 258 06/13/2019 1612    BMET    Component Value Date/Time   NA 134 (L) 09/14/2022 0422   NA 134 06/18/2020 1556   K 4.0 09/14/2022 0422   CL 104 09/14/2022 0422   CO2 20 (L) 09/14/2022 0422   GLUCOSE 137 (H) 09/14/2022 0422   BUN 41 (H) 09/14/2022 0422   BUN 21 06/18/2020 1556   CREATININE 1.34 (H) 09/14/2022 0422   CALCIUM 9.4 09/14/2022 0422   GFRNONAA 55 (L) 09/14/2022 0422   GFRAA 66 06/18/2020 1556    COAG Lab Results  Component Value Date   INR 1.0 09/03/2022   No results found for: "PTT"  Antibiotics Anti-infectives (From admission, onward)    Start     Dose/Rate Route Frequency Ordered Stop   09/10/22 0138  ceFAZolin (ANCEF) IVPB 2g/100 mL premix        2 g 200 mL/hr over 30 Minutes Intravenous 30 min pre-op 09/10/22 0138 09/10/22 1132   09/04/22 1611  ceFAZolin (ANCEF) IVPB 1 g/50 mL premix  Status:  Discontinued        over 30 Minutes  Continuous PRN  09/04/22 1611 09/04/22 1656   09/04/22 1437  ceFAZolin (ANCEF) IVPB 2g/100 mL premix  Status:  Discontinued        2 g 200 mL/hr over 30 Minutes Intravenous 30 min pre-op 09/04/22 1437 09/04/22 1656        V. Charlena Cross, M.D., [facility] [place] Office: [phone] Pager:  [phone]

## 2022-09-14 NOTE — [place] FL2 (Signed)
[place] MEDICAID FL2 LEVEL OF CARE FORM     IDENTIFICATION  Patient Name: William Ball Birthdate: 1945/06/19 Sex: male Admission Date (Current Location): 09/03/2022  [facility] [place] Number:  Chiropodist and Address:  [facility], [address] [place], [place] [address]      Provider Number: [id number]  Attending Physician Name and Address:  Darlin Priestly, MD  Relative Name and Phone Number:  Reuven, Hildebrand (Spouse) [phone] (Mobile)    Current Level of Care: Hospital Recommended Level of Care: Skilled Nursing Facility Prior Approval Number:    Date Approved/Denied:   PASRR Number: [id number]  Discharge Plan: SNF    Current Diagnoses: Patient Active Problem List   Diagnosis Date Noted   Overweight (BMI 25.0-29.9) 09/06/2022   Hyponatremia 09/05/2022   PVD (peripheral vascular disease) (HCC) 09/04/2022   Acute lower limb ischemia 09/03/2022   Right leg pain 09/03/2022   Alcohol use 09/03/2022   Atherosclerosis of native arteries of extremity with rest pain (HCC) 09/02/2022   Atherosclerosis of native arteries of extremity with intermittent claudication (HCC) 08/31/2022   Diabetes (HCC) 08/31/2022   Hypertension associated with diabetes (HCC) 12/06/2018   CKD (chronic kidney disease) stage 3, GFR 30-59 ml/min (HCC) 02/18/2018   Arterial insufficiency of lower extremity (HCC) 09/23/2017   Emphysema lung (HCC) 08/17/2017   Aortic valve calcification 08/17/2017   Aortic atherosclerosis (HCC) 07/28/2016   Advanced care planning/counseling discussion    First degree hemorrhoids    Personal history of tobacco use, presenting hazards to health 06/25/2016   Smoking greater than 40 pack years 12/05/2015   Senile purpura (HCC) 12/05/2015   Hip arthritis 06/07/2015   BPH (benign prostatic hyperplasia) 06/07/2015   Gastroesophageal reflux 01/22/2015   Hyperlipidemia    Type 2 DM with CKD stage 3 and hypertension (HCC)     Hypertensive nephropathy     Orientation RESPIRATION BLADDER Height & Weight     Self, Time, Situation, Place  Normal Continent Weight: 83 kg Height:  5\' 9"  (175.3 cm)  BEHAVIORAL SYMPTOMS/MOOD NEUROLOGICAL BOWEL NUTRITION STATUS      Continent Diet  AMBULATORY STATUS COMMUNICATION OF NEEDS Skin   Extensive Assist (due to bilateral wound vacs in place in right and left groin/incision sites for 7 days post op.) Verbally (Missing teeth) Wound Vac, Surgical wounds (Bilateral prevana wound vac in right and left groin/incision area.)  (Addendum: Wound vacs removed 09/14/22, keep dressing dry per provider note).                      Personal Care Assistance Level of Assistance  Bathing, Dressing Bathing Assistance: Limited assistance   Dressing Assistance: Maximum assistance     Functional Limitations Info             SPECIAL CARE FACTORS FREQUENCY  PT (By licensed PT), OT (By licensed OT)     PT Frequency: 5x/week OT Frequency: 5x/week            Contractures Contractures Info: Not present    Additional Factors Info  Code Status, Allergies Code Status Info: Full Code Allergies Info: Amlodipine           Current Medications (09/14/2022):  This is the current hospital active medication list Current Facility-Administered Medications  Medication Dose Route Frequency Provider Last Rate Last Admin   aspirin EC tablet 81 mg  81 mg Oral Daily Annice Needy, MD   81 mg at 09/14/22 0848   atorvastatin (  LIPITOR) tablet 20 mg  20 mg Oral Daily Annice Needy, MD   20 mg at 09/14/22 0848   carvedilol (COREG) tablet 3.125 mg  3.125 mg Oral BID Annice Needy, MD   3.125 mg at 09/14/22 0848   clopidogrel (PLAVIX) tablet 75 mg  75 mg Oral Daily Annice Needy, MD   75 mg at 09/14/22 0851   enoxaparin (LOVENOX) injection 40 mg  40 mg Subcutaneous Q24H Darlin Priestly, MD   40 mg at 09/13/22 2224   fentaNYL (SUBLIMAZE) injection 12.5 mcg  12.5 mcg Intravenous Once PRN Annice Needy, MD        folic acid (FOLVITE) tablet 1 mg  1 mg Oral Daily Annice Needy, MD   1 mg at 09/14/22 0848   insulin aspart (novoLOG) injection 0-9 Units  0-9 Units Subcutaneous TID WC Annice Needy, MD   1 Units at 09/14/22 1213   irbesartan (AVAPRO) tablet 300 mg  300 mg Oral Daily Annice Needy, MD   300 mg at 09/14/22 0848   melatonin tablet 5 mg  5 mg Oral QHS Annice Needy, MD   5 mg at 09/13/22 2224   morphine (PF) 2 MG/ML injection 1 mg  1 mg Intravenous Q4H PRN Darlin Priestly, MD   1 mg at 09/13/22 2224   multivitamin with minerals tablet 1 tablet  1 tablet Oral Daily Annice Needy, MD   1 tablet at 09/14/22 0849   nicotine (NICODERM CQ - dosed in mg/24 hours) patch 21 mg  21 mg Transdermal Daily PRN Annice Needy, MD       ondansetron (ZOFRAN) injection 4 mg  4 mg Intravenous Q6H PRN Annice Needy, MD       oxyCODONE-acetaminophen (PERCOCET/ROXICET) 5-325 MG per tablet 1-2 tablet  1-2 tablet Oral Q4H PRN Darlin Priestly, MD   2 tablet at 09/14/22 1438   polyethylene glycol (MIRALAX / GLYCOLAX) packet 34 g  34 g Oral BID Darlin Priestly, MD   34 g at 09/14/22 0848   senna-docusate (Senokot-S) tablet 2 tablet  2 tablet Oral BID Annice Needy, MD   2 tablet at 09/14/22 0849   terazosin (HYTRIN) capsule 2 mg  2 mg Oral QHS Annice Needy, MD   2 mg at 09/13/22 2225   thiamine (VITAMIN B1) tablet 100 mg  100 mg Oral Daily Annice Needy, MD   100 mg at 09/14/22 0848   Or   thiamine (VITAMIN B1) injection 100 mg  100 mg Intravenous Daily Annice Needy, MD         Discharge Medications: Please see discharge summary for a list of discharge medications.  Relevant Imaging Results:  Relevant Lab Results:   Additional Information SS# [id number]  Bing Quarry, RN

## 2022-09-14 NOTE — Progress Notes (Signed)
  PROGRESS NOTE    SHUNSUKE MASCIARELLI  ZOX:[id number] DOB: 1945/05/28 DOA: 09/03/2022 PCP: Luciana Axe, NP  106A/106A-BB  LOS: 11 days   Brief hospital course:   Assessment & Plan: [name] is a 77 year old male with history of PAD, hypertension, hyperlipidemia, non-insulin-dependent diabetes mellitus, who presents to emergency department for chief concerns of right limb pain. Patient is placed on heparin drip for leg ischemia, seen by vascular surgery, planning for intervention on 5/8  Acute lower limb ischemia Peripheral arterial disease. Tobacco abuse. S/p bilateral femoral endarterectomy and bilateral iliac stents 09/10/22 Patient advised to quit smoking. Plan: --wound vac removed by vascular surgery today --no dressing recommendation, just keep area clean and dry, per vascular --cont ASA, plavix and statin  Constipation from pain medicine. --miralax BID   Alcohol use No evidence of withdrawal.     Hypertension associated with diabetes (HCC) --cont coreg and irbesartan   Aortic atherosclerosis (HCC) --cont ASA, plavix and statin   BPH (benign prostatic hyperplasia) Terazosin 2 mg nightly   Hyperlipidemia --cont statin  DM2 --A1c 8.0, poorly controlled --SSI    DVT prophylaxis: Lovenox SQ Code Status: Full code  Family Communication: wife updated at bedside today Level of care: Med-Surg Dispo:   The patient is from: home Anticipated d/c is to: SNF rehab vs home Anticipated d/c date is: whenever bed available   Subjective and Interval History:  Wound vac not functioning properly, removed by vascular surgery today.  Pt reported doing better and asked about going home instead to SNF.   Objective: Vitals:   09/13/22 2016 09/14/22 0510 09/14/22 0802 09/14/22 1730  BP: (!) 143/64 (!) 144/47 (!) 153/57 (!) 133/46  Pulse: (!) 56 (!) 57 (!) 56 60  Resp: 16 18 16    Temp: 98.2 F (36.8 C) 98 F (36.7 C) 98.4 F (36.9 C) 98 F (36.7 C)  TempSrc:  Oral Oral  Oral  SpO2: 97% 95% 97% 98%  Weight:      Height:        Intake/Output Summary (Last 24 hours) at 09/14/2022 1810 Last data filed at 09/14/2022 1424 Gross per 24 hour  Intake 480 ml  Output 350 ml  Net 130 ml    Filed Weights   09/03/22 1600 09/10/22 0947  Weight: 85.3 kg 83 kg    Examination:   Constitutional: NAD, AAOx3 HEENT: conjunctivae and lids normal, EOMI CV: No cyanosis.   RESP: normal respiratory effort, on RA Extremities: long incision over both groins with staples SKIN: warm, dry Neuro: II - XII grossly intact.   Psych: Normal mood and affect.  Appropriate judgement and reason   Data Reviewed: I have personally reviewed labs and imaging studies  Time spent: 35 minutes  Darlin Priestly, MD [facility] If 7PM-7AM, please contact night-coverage 09/14/2022, 6:10 PM

## 2022-09-15 DIAGNOSIS — I998 Other disorder of circulatory system: Secondary | ICD-10-CM | POA: Diagnosis not present

## 2022-09-15 LAB — BASIC METABOLIC PANEL WITH GFR
Anion gap: 10 (ref 5–15)
BUN: 50 mg/dL — ABNORMAL HIGH (ref 8–23)
CO2: 21 mmol/L — ABNORMAL LOW (ref 22–32)
Calcium: 9 mg/dL (ref 8.9–10.3)
Chloride: 102 mmol/L (ref 98–111)
Creatinine, Ser: 1.49 mg/dL — ABNORMAL HIGH (ref 0.61–1.24)
GFR, Estimated: 48 mL/min — ABNORMAL LOW
Glucose, Bld: 124 mg/dL — ABNORMAL HIGH (ref 70–99)
Potassium: 4 mmol/L (ref 3.5–5.1)
Sodium: 133 mmol/L — ABNORMAL LOW (ref 135–145)

## 2022-09-15 LAB — CBC
HCT: 33.1 % — ABNORMAL LOW (ref 39.0–52.0)
Hemoglobin: 10.9 g/dL — ABNORMAL LOW (ref 13.0–17.0)
MCH: 29.6 pg (ref 26.0–34.0)
MCHC: 32.9 g/dL (ref 30.0–36.0)
MCV: 89.9 fL (ref 80.0–100.0)
Platelets: 299 K/uL (ref 150–400)
RBC: 3.68 MIL/uL — ABNORMAL LOW (ref 4.22–5.81)
RDW: 12.5 % (ref 11.5–15.5)
WBC: 8 K/uL (ref 4.0–10.5)
nRBC: 0 % (ref 0.0–0.2)

## 2022-09-15 LAB — GLUCOSE, CAPILLARY
Glucose-Capillary: 203 mg/dL — ABNORMAL HIGH (ref 70–99)
Glucose-Capillary: 98 mg/dL (ref 70–99)

## 2022-09-15 LAB — MAGNESIUM: Magnesium: 2.1 mg/dL (ref 1.7–2.4)

## 2022-09-15 MED ORDER — POLYETHYLENE GLYCOL 3350 17 G PO PACK: 17.0000 g | PACK | Freq: Two times a day (BID) | ORAL | 0 refills | Status: DC

## 2022-09-15 MED ORDER — ADULT MULTIVITAMIN W/MINERALS CH: 1.0000 | ORAL_TABLET | Freq: Every day | ORAL | Status: DC

## 2022-09-15 MED ORDER — VITAMIN B-1 100 MG PO TABS: 100.0000 mg | ORAL_TABLET | Freq: Every day | ORAL | Status: AC

## 2022-09-15 MED ORDER — FOLIC ACID 1 MG PO TABS: 1.0000 mg | ORAL_TABLET | Freq: Every day | ORAL | Status: AC

## 2022-09-15 MED ORDER — NICOTINE 21 MG/24HR TD PT24: 21.0000 mg | MEDICATED_PATCH | Freq: Every day | TRANSDERMAL | 0 refills | Status: DC | PRN

## 2022-09-15 MED ORDER — SENNOSIDES-DOCUSATE SODIUM 8.6-50 MG PO TABS: 2.0000 | ORAL_TABLET | Freq: Two times a day (BID) | ORAL | Status: DC

## 2022-09-15 MED ORDER — CLOPIDOGREL BISULFATE 75 MG PO TABS: 75.0000 mg | ORAL_TABLET | Freq: Every day | ORAL | 2 refills | Status: AC

## 2022-09-15 MED ORDER — HYDROCODONE-ACETAMINOPHEN 5-325 MG PO TABS: 1.0000 | ORAL_TABLET | Freq: Four times a day (QID) | ORAL | 0 refills | Status: AC | PRN

## 2022-09-15 NOTE — Progress Notes (Signed)
Physical Therapy Treatment Patient Details Name: William Ball MRN: [id number] DOB: January 01, 1946 Today's Date: 09/15/2022   History of Present Illness 77 year old male with history of PAD, hypertension, hyperlipidemia, non-insulin-dependent diabetes mellitus, who presents to ED with right limb pain. Now s/p bilateral femoral endarterectomies, angioplasties, and stents placement on 09/10/22.    PT Comments    Pt sitting up in recliner upon PT arrival and reports he has been ambulating to bathroom (with RW) on his own; agreeable to therapy.  During session pt modified independent with transfers and SBA ambulating 100 feet with RW use (limited d/t pain--nurse notified of pt's request for pain meds).  Pt steady and safe with functional mobility using RW and requesting home discharge with HHPT (pt reports having ramp to enter home): MD and TOC notified and updated on pt's status.    Recommendations for follow up therapy are one component of a multi-disciplinary discharge planning process, led by the attending physician.  Recommendations may be updated based on patient status, additional functional criteria and insurance authorization.  Follow Up Recommendations  Can patient physically be transported by private vehicle: Yes    Assistance Recommended at Discharge Intermittent Supervision/Assistance  Patient can return home with the following A little help with walking and/or transfers;A little help with bathing/dressing/bathroom;Assistance with cooking/housework;Assist for transportation;Help with stairs or ramp for entrance   Equipment Recommendations  Rolling walker (2 wheels) (pt reports having RW at home already)    Recommendations for Other Services       Precautions / Restrictions Precautions Precautions: Fall Restrictions Weight Bearing Restrictions: No     Mobility  Bed Mobility               General bed mobility comments: Deferred (pt in recliner beginning/end of session)     Transfers Overall transfer level: Modified independent Equipment used: Rolling walker (2 wheels) Transfers: Sit to/from Stand Sit to Stand: Modified independent (Device/Increase time)   Step pivot transfers: Modified independent (Device/Increase time)       General transfer comment: steady transfers with RW use    Ambulation/Gait Ambulation/Gait assistance: Supervision Gait Distance (Feet): 100 Feet Assistive device: Rolling walker (2 wheels) Gait Pattern/deviations: Step-to pattern, Decreased stance time - right, Decreased dorsiflexion - right Gait velocity: decreased     General Gait Details: steady ambulation with RW use; occasional brief standing rest breaks d/t R foot/ankle pain   Stairs Stairs:  (pt reports no steps into home (has ramp))           Wheelchair Mobility    Modified Rankin (Stroke Patients Only)       Balance Overall balance assessment: Modified Independent Sitting-balance support: No upper extremity supported, Feet supported Sitting balance-Leahy Scale: Normal Sitting balance - Comments: steady sitting reaching outside BOS   Standing balance support: Single extremity supported Standing balance-Leahy Scale: Good Standing balance comment: steady standing with single UE support                            Cognition Arousal/Alertness: Awake/alert Behavior During Therapy: WFL for tasks assessed/performed Overall Cognitive Status: Within Functional Limits for tasks assessed                                          Exercises      General Comments General comments (skin integrity, edema, etc.): no drainage noted  B groin dressings.  Nursing cleared pt for participation in physical therapy.  Pt agreeable to PT session.      Pertinent Vitals/Pain Pain Assessment Pain Assessment: 0-10 Pain Score: 6  Pain Location: bilateral groin, R foot Pain Descriptors / Indicators: Sore, Tender, Burning Pain  Intervention(s): Limited activity within patient's tolerance, Monitored during session, Repositioned, Patient requesting pain meds-RN notified Vitals (HR and O2 on room air) stable and WFL throughout treatment session.    Home Living                          Prior Function            PT Goals (current goals can now be found in the care plan section) Acute Rehab PT Goals Patient Stated Goal: to have less pain PT Goal Formulation: With patient Time For Goal Achievement: 09/25/22 Potential to Achieve Goals: Good Progress towards PT goals: Progressing toward goals    Frequency    Min 4X/week      PT Plan Discharge plan needs to be updated (MD and TOC notified)    Co-evaluation              AM-PAC PT "6 Clicks" Mobility   Outcome Measure  Help needed turning from your back to your side while in a flat bed without using bedrails?: None Help needed moving from lying on your back to sitting on the side of a flat bed without using bedrails?: None Help needed moving to and from a bed to a chair (including a wheelchair)?: None Help needed standing up from a chair using your arms (e.g., wheelchair or bedside chair)?: None Help needed to walk in hospital room?: A Little Help needed climbing 3-5 steps with a railing? : A Little 6 Click Score: 22    End of Session Equipment Utilized During Treatment: Gait belt Activity Tolerance: Patient limited by pain Patient left: in chair;with call bell/phone within reach Nurse Communication: Mobility status;Precautions (pt in chair upon PT arrival without chair alarm and pt reporting he has been walking to bathroom on own (nurse agreeable to keeping no chair alarm in place)) PT Visit Diagnosis: Other abnormalities of gait and mobility (R26.89);Muscle weakness (generalized) (M62.81);Pain Pain - Right/Left:  (B) Pain - part of body: Leg     Time: 1610-9604 PT Time Calculation (min) (ACUTE ONLY): 16 min  Charges:  $Gait  Training: 8-22 mins                     Hendricks Limes, PT 09/15/22, 12:44 PM

## 2022-09-15 NOTE — TOC Transition Note (Signed)
Transition of Care [facility]) - CM/SW Discharge Note   Patient Details  Name: William Ball MRN: [id number] Date of Birth: 12/20/1945  [facility]) CM/SW Contact:  Allena Katz, LCSW Phone Number: 09/15/2022, 3:21 PM   Clinical Narrative:   CSW spoke with patients wife. She does not want a 3in1 at home and reports pt has an elevated toilet seat. Wife is interested in [facility] and does not have a preference. HH arranged through [facility] for PT/OT/RN. CSW let sarah know patient would be discharging today. CSW signing off.       Barriers to Discharge: Continued Medical Work up   Patient Goals and CMS Choice      Discharge Placement                         Discharge Plan and Services Additional resources added to the After Visit Summary for       Post Acute Care Choice: NA                               Social Determinants of Health (SDOH) Interventions SDOH Screenings   Food Insecurity: No Food Insecurity (09/04/2022)  Housing: Low Risk  (09/04/2022)  Transportation Needs: No Transportation Needs (09/04/2022)  Utilities: Not At Risk (09/04/2022)  Depression (PHQ2-9): Low Risk  (06/13/2019)  Financial Resource Strain: Low Risk  (08/06/2017)  Physical Activity: Inactive (08/06/2017)  Social Connections: Moderately Isolated (08/06/2017)  Stress: No Stress Concern Present (08/06/2017)  Tobacco Use: High Risk (09/12/2022)     Readmission Risk Interventions     No data to display

## 2022-09-15 NOTE — Progress Notes (Signed)
Progress Note    09/15/2022 3:14 PM 5 Days Post-Op  Subjective: William Ball 77 year old male who is now postop day 5 from bilateral femoral endarterectomies with bilateral iliac stents.  Patient continues to endorse soreness and pain from bilateral groins.  Patient endorses a pain medicine helps with this and allows him to ambulate.  She was seen by physical therapy earlier today and confirms that the patient now may be functional and go home versus going to rehab.  Patient and family at the bedside endorse and rather have the patient come home and they will look after him as needed.  Other complaints overnight and his vitals all remained stable.  On exam today patient was ambulating from the bathroom to the bed with his walker.  Endorses soreness upon ambulation but with some pain medicine he is able to do so.  He endorses he will be able to do this at home.  Bilateral groins have staples intact.  Prevena wound vacs were removed over the weekend as they were not working well.  No hematoma seroma or infection to note at this point in time.   Vitals:   09/15/22 0534 09/15/22 0837  BP: (!) 119/45 (!) 150/67  Pulse: (!) 57 62  Resp: 19 16  Temp: 98.1 F (36.7 C) 98.2 F (36.8 C)  SpO2: 97% 100%   Physical Exam: Cardiac:  RRR, Normal S1, S2  Lungs:  Clear on auscultation throughout, normal respiratory effort, scattered rhonchi with post operative atelectases cough. Incisions:  Bilateral groin incisions with prevena wound vac working well with dressing clean dry and intact. No hematoma seroma to note.  Extremities:   Strong doppler pulses to left and right lower extremities,  Abdomen:  Positive bowel sounds, soft, NT/ND, no masses  Neurologic: A&O X 3; No focal weakness or paresthesias are detected; speech is fluent/normal.   CBC    Component Value Date/Time   WBC 8.0 09/15/2022 0428   RBC 3.68 (L) 09/15/2022 0428   HGB 10.9 (L) 09/15/2022 0428   HGB 14.0 06/13/2019 1612   HCT 33.1  (L) 09/15/2022 0428   HCT 41.5 06/13/2019 1612   PLT 299 09/15/2022 0428   PLT 258 06/13/2019 1612   MCV 89.9 09/15/2022 0428   MCV 92 06/13/2019 1612   MCH 29.6 09/15/2022 0428   MCHC 32.9 09/15/2022 0428   RDW 12.5 09/15/2022 0428   RDW 12.8 06/13/2019 1612   LYMPHSABS 3.7 09/03/2022 1610   LYMPHSABS 2.1 06/13/2019 1612   MONOABS 0.8 09/03/2022 1610   EOSABS 0.8 (H) 09/03/2022 1610   EOSABS 0.6 (H) 06/13/2019 1612   BASOSABS 0.1 09/03/2022 1610   BASOSABS 0.1 06/13/2019 1612    BMET    Component Value Date/Time   NA 133 (L) 09/15/2022 0428   NA 134 06/18/2020 1556   K 4.0 09/15/2022 0428   CL 102 09/15/2022 0428   CO2 21 (L) 09/15/2022 0428   GLUCOSE 124 (H) 09/15/2022 0428   BUN 50 (H) 09/15/2022 0428   BUN 21 06/18/2020 1556   CREATININE 1.49 (H) 09/15/2022 0428   CALCIUM 9.0 09/15/2022 0428   GFRNONAA 48 (L) 09/15/2022 0428   GFRAA 66 06/18/2020 1556    INR    Component Value Date/Time   INR 1.0 09/03/2022 1610     Intake/Output Summary (Last 24 hours) at 09/15/2022 1514 Last data filed at 09/15/2022 1432 Gross per 24 hour  Intake 720 ml  Output --  Net 720 ml     Assessment/Plan:  77 y.o. male is s/p bilateral femoral endarterectomies with bilateral iliac stent placement.  5 Days Post-Op   PLAN: Vascular surgery patient may be discharged home today. Patient needs to be more active when he gets home.  He was instructed to walk around the house at least once every 2 hours.  Do not sit in a chair all day or lay in bed all day. Patient was instructed on how to keep bilateral groins dry at all times now the Prevena wound vacs have been removed.  Patient is allowed to shower but must dry bilateral groins immediately afterwards. Patient to be discharged on 81 mg aspirin daily 75 mg Plavix daily and a statin. Patient was instructed to not lift anything more than a gallon of milk.  Patient instructed not to drive until he comes back to clinic and get his staples  removed.  Patient was instructed to not drink any alcohol while healing.  Patient was also instructed to increase his fluid intake by drinking a minimum of 512 ounce glasses of water a day and increasing his caloric intake for healing purposes. She will return to clinic in 2 weeks for staple removal.  DVT prophylaxis: ASA 81 mg daily, Plavix 75 mg daily   Marcie Bal [facility] 09/15/2022 3:14 PM

## 2022-09-15 NOTE — Care Management Important Message (Signed)
Important Message  Patient Details  Name: William Ball MRN: [id number] Date of Birth: [birth year]   Medicare Important Message Given:  Yes     [name] 09/15/2022, 2:26 PM

## 2022-09-15 NOTE — Discharge Summary (Signed)
Physician Discharge Summary   William Ball  male DOB: 03-09-1946  FGH:[id number]  PCP: Luciana Axe, NP  Admit date: 09/03/2022 Discharge date: 09/15/2022  Admitted From: home Disposition:  home Home Health: Yes CODE STATUS: Full code  Discharge Instructions     Discharge instructions   Complete by: As directed    Vascular surgery added plavix for you to take in addition to your aspirin.  Please follow up with Dr. Driscilla Grammes office in 2 weeks to take the staples out.   Dr. Darlin Priestly - -   No wound care   Complete by: As directed       Hospital Course:  For full details, please see H&P, progress notes, consult notes and ancillary notes.  Briefly,  William Ball is a 77 year old male with history of PAD, hypertension, hyperlipidemia, non-insulin-dependent diabetes mellitus, who presented to emergency department for chief concerns of right limb pain. Patient is placed on heparin drip for leg ischemia, seen by vascular surgery.   Acute lower limb ischemia Peripheral arterial disease. Tobacco abuse. S/p bilateral femoral endarterectomy and bilateral iliac stents 09/10/22 Patient advised to quit smoking. --wound vac removed by vascular surgery prior to discharge. --no dressing recommendation, just keep area clean and dry, per vascular --cont ASA, plavix (new) and statin --return to vascular clinic in 2 weeks for staple removal.    Constipation from pain medicine. --discharged on Miralax and Senokot   Alcohol use No evidence of withdrawal.     Hypertension associated with diabetes (HCC) --cont home coreg valsartan  --home chlorthalidone, aldactone to be resumed after discharge. --not taking benazepril PTA   Aortic atherosclerosis (HCC) --cont ASA, plavix (new) and statin   BPH (benign prostatic hyperplasia) Terazosin 2 mg nightly   Hyperlipidemia --cont statin   DM2 --A1c 8.0, poorly controlled --discharged back on home regimen as below.   Discharge  Diagnoses:  Principal Problem:   Acute lower limb ischemia Active Problems:   Hyperlipidemia   Gastroesophageal reflux   BPH (benign prostatic hyperplasia)   Smoking greater than 40 pack years   Aortic atherosclerosis (HCC)   Arterial insufficiency of lower extremity (HCC)   Hypertension associated with diabetes (HCC)   Right leg pain   Alcohol use   PVD (peripheral vascular disease) (HCC)   Hyponatremia   Overweight (BMI 25.0-29.9)   30 Day Unplanned Readmission Risk Score    Flowsheet Row ED to Hosp-Admission (Current) from 09/03/2022 in [facility] 1C MEDICAL TELEMETRY  30 Day Unplanned Readmission Risk Score (%) 15.72 Filed at 09/15/2022 1200       This score is the patient's risk of an unplanned readmission within 30 days of being discharged (0 -100%). The score is based on dignosis, age, lab data, medications, orders, and past utilization.   Low:  0-14.9   Medium: 15-21.9   High: 22-29.9   Extreme: 30 and above         Discharge Instructions:  Allergies as of 09/15/2022       Reactions   Amlodipine Itching        Medication List     STOP taking these medications    benazepril 40 MG tablet Commonly known as: LOTENSIN       TAKE these medications    albuterol 108 (90 Base) MCG/ACT inhaler Commonly known as: VENTOLIN HFA Inhale into the lungs.   Aspirin 81 MG Caps Take by mouth.   atorvastatin 20 MG tablet Commonly known as: LIPITOR TAKE 1  TABLET BY MOUTH EVERY DAY   carvedilol 3.125 MG tablet Commonly known as: COREG Take 3.125 mg by mouth 2 (two) times daily.   chlorthalidone 50 MG tablet Commonly known as: HYGROTON Take 1 tablet (50 mg total) by mouth daily. MUST BE SEEN IN CLINIC FOR FURTHER REFILLS ON MEDICATION   cilostazol 50 MG tablet Commonly known as: PLETAL Take 50 mg by mouth 2 (two) times daily.   clopidogrel 75 MG tablet Commonly known as: PLAVIX Take 1 tablet (75 mg total) by mouth daily. Start  taking on: Sep 16, 2022   FISH OIL PO Take by mouth daily.   folic acid 1 MG tablet Commonly known as: FOLVITE Take 1 tablet (1 mg total) by mouth daily. Start taking on: Sep 16, 2022   glipiZIDE 10 MG tablet Commonly known as: GLUCOTROL TAKE 1 TABLET (10 MG TOTAL) BY MOUTH 2 (TWO) TIMES DAILY BEFORE A MEAL.   HYDROcodone-acetaminophen 5-325 MG tablet Commonly known as: Norco Take 1-2 tablets by mouth every 6 (six) hours as needed for up to 5 days for moderate pain or severe pain. What changed:  how much to take when to take this   Jardiance 10 MG Tabs tablet Generic drug: empagliflozin Take 10 mg by mouth daily.   multivitamin with minerals Tabs tablet Take 1 tablet by mouth daily. Start taking on: Sep 16, 2022   nicotine 21 mg/24hr patch Commonly known as: NICODERM CQ - dosed in mg/24 hours Place 1 patch (21 mg total) onto the skin daily as needed (nicotine craving).   Novofine Pen Needle 32G X 6 MM Misc Generic drug: Insulin Pen Needle Use once daily with Lantus pen   polyethylene glycol 17 g packet Commonly known as: MIRALAX / GLYCOLAX Take 17 g by mouth 2 (two) times daily.   senna-docusate 8.6-50 MG tablet Commonly known as: Senokot-S Take 2 tablets by mouth 2 (two) times daily.   spironolactone 25 MG tablet Commonly known as: ALDACTONE Take 1 tablet by mouth daily.   terazosin 2 MG capsule Commonly known as: HYTRIN Take 2 mg by mouth at bedtime.   thiamine 100 MG tablet Commonly known as: Vitamin B-1 Take 1 tablet (100 mg total) by mouth daily. Start taking on: Sep 16, 2022   Evaristo Bury FlexTouch 100 UNIT/ML FlexTouch Pen Generic drug: insulin degludec Inject 30 Units into the skin at bedtime.   valsartan 320 MG tablet Commonly known as: DIOVAN 160 mg.         Follow-up Information     Luciana Axe, NP Follow up in 1 week(s).   Specialty: Family Medicine Contact information: [address] [place] [address] [phone]          Annice Needy, MD Follow up in 2 week(s).   Specialties: Vascular Surgery, Radiology, Interventional Cardiology Why: wound check and take out staples Contact information: [address] [place] [address] [phone]                 Allergies  Allergen Reactions   Amlodipine Itching     The results of significant diagnostics from this hospitalization (including imaging, microbiology, ancillary and laboratory) are listed below for reference.   Consultations:   Procedures/Studies: DG C-Arm 1-60 Min-No Report  Result Date: 09/10/2022 Fluoroscopy was utilized by the requesting physician.  No radiographic interpretation.   PERIPHERAL VASCULAR CATHETERIZATION  Result Date: 09/04/2022 See surgical note for result.  US Venous Img Lower Unilateral Right  Result Date: 09/03/2022  CLINICAL DATA:  Erythema, pain, swelling in the right lower extremity EXAM: RIGHT LOWER EXTREMITY VENOUS DOPPLER ULTRASOUND TECHNIQUE: Gray-scale sonography with compression, as well as color and duplex ultrasound, were performed to evaluate the deep venous system(s) from the level of the common femoral vein through the popliteal and proximal calf veins. COMPARISON:  None Available. FINDINGS: VENOUS Normal compressibility of the common femoral, superficial femoral, and popliteal veins, as well as the visualized calf veins. Visualized portions of profunda femoral vein and great saphenous vein unremarkable. No filling defects to suggest DVT on grayscale or color Doppler imaging. Doppler waveforms show normal direction of venous flow, normal respiratory plasticity and response to augmentation. Limited views of the contralateral common femoral vein are unremarkable. OTHER None. Limitations: none IMPRESSION: No right lower extremity DVT. Electronically Signed   By: Gaylyn Rong M.D.   On: 09/03/2022 15:34      Labs: BNP (last 3 results) No results for input(s): "BNP" in the last 8760  hours. Basic Metabolic Panel: Recent Labs  Lab 09/11/22 0501 09/12/22 0500 09/13/22 0428 09/14/22 0422 09/15/22 0428  NA 134* 136 135 134* 133*  K 4.8 4.3 4.1 4.0 4.0  CL 106 108 107 104 102  CO2 19* 22 21* 20* 21*  GLUCOSE 163* 180* 140* 137* 124*  BUN 44* 40* 36* 41* 50*  CREATININE 1.35* 1.39* 1.27* 1.34* 1.49*  CALCIUM 8.9 9.0 9.0 9.4 9.0  MG 1.5* 2.1 1.9 2.0 2.1   Liver Function Tests: No results for input(s): "AST", "ALT", "ALKPHOS", "BILITOT", "PROT", "ALBUMIN" in the last 168 hours. No results for input(s): "LIPASE", "AMYLASE" in the last 168 hours. No results for input(s): "AMMONIA" in the last 168 hours. CBC: Recent Labs  Lab 09/11/22 0501 09/12/22 0500 09/13/22 0428 09/14/22 0422 09/15/22 0428  WBC 9.3 10.3 10.9* 9.5 8.0  HGB 14.2 11.7* 10.9* 12.1* 10.9*  HCT 43.0 35.5* 33.1* 35.9* 33.1*  MCV 89.6 91.0 90.4 90.7 89.9  PLT 230 209 215 289 299   Cardiac Enzymes: No results for input(s): "CKTOTAL", "CKMB", "CKMBINDEX", "TROPONINI" in the last 168 hours. BNP: Invalid input(s): "POCBNP" CBG: Recent Labs  Lab 09/14/22 1144 09/14/22 1731 09/14/22 2029 09/15/22 0838 09/15/22 1207  GLUCAP 143* 150* 177* 203* 98   D-Dimer No results for input(s): "DDIMER" in the last 72 hours. Hgb A1c No results for input(s): "HGBA1C" in the last 72 hours. Lipid Profile No results for input(s): "CHOL", "HDL", "LDLCALC", "TRIG", "CHOLHDL", "LDLDIRECT" in the last 72 hours. Thyroid function studies No results for input(s): "TSH", "T4TOTAL", "T3FREE", "THYROIDAB" in the last 72 hours.  Invalid input(s): "FREET3" Anemia work up No results for input(s): "VITAMINB12", "FOLATE", "FERRITIN", "TIBC", "IRON", "RETICCTPCT" in the last 72 hours. Urinalysis    Component Value Date/Time   COLORURINE YELLOW (A) 01/27/2017 1016   APPEARANCEUR Clear 06/13/2019 1611   LABSPEC 1.009 01/27/2017 1016   PHURINE 5.0 01/27/2017 1016   GLUCOSEU Negative 06/13/2019 1611   HGBUR NEGATIVE  01/27/2017 1016   BILIRUBINUR Negative 06/13/2019 1611   KETONESUR NEGATIVE 01/27/2017 1016   PROTEINUR Negative 06/13/2019 1611   PROTEINUR NEGATIVE 01/27/2017 1016   NITRITE Negative 06/13/2019 1611   NITRITE NEGATIVE 01/27/2017 1016   LEUKOCYTESUR Negative 06/13/2019 1611   Sepsis Labs Recent Labs  Lab 09/12/22 0500 09/13/22 0428 09/14/22 0422 09/15/22 0428  WBC 10.3 10.9* 9.5 8.0   Microbiology Recent Results (from the past 240 hour(s))  MRSA Next Gen by PCR, Nasal     Status: None   Collection Time:  09/10/22  5:23 PM   Specimen: Nasal Mucosa; Nasal Swab  Result Value Ref Range Status   MRSA by PCR Next Gen NOT DETECTED NOT DETECTED Final    Comment: (NOTE) The GeneXpert MRSA Assay (FDA approved for NASAL specimens only), is one component of a comprehensive MRSA colonization surveillance program. It is not intended to diagnose MRSA infection nor to guide or monitor treatment for MRSA infections. Test performance is not FDA approved in patients less than 48 years old. Performed at [facility], [address] [place], [place] [address]      Total time spend on discharging this patient, including the last patient exam, discussing the hospital stay, instructions for ongoing care as it relates to all pertinent caregivers, as well as preparing the medical discharge records, prescriptions, and/or referrals as applicable, is 35 minutes.    Darlin Priestly, MD  [facility] 09/15/2022, 2:38 PM

## 2022-09-15 NOTE — Progress Notes (Signed)
Occupational Therapy Treatment Patient Details Name: William Ball MRN: [id number] DOB: 1946-02-06 Today's Date: 09/15/2022   History of present illness 77 year old male with history of PAD, hypertension, hyperlipidemia, non-insulin-dependent diabetes mellitus, who presents to ED with right limb pain. Now s/p bilateral femoral endarterectomies, angioplasties, and stents placement on 09/10/22.   OT comments  Upon entering the room, pt supine in bed and agreeable to OT intervention. Pt performing bed mobility without assistance. Pt dons B Socks with figure four position while seated. Pt stands from bed with mod I and use of RW. Pt navigates tight spaces in room with cuing for side steps to increase ease. Pt ambulates 100' with RW at mod I level and reports fatigue and requests to return back to room. He returns in same manner as above and returns back to bed. Pt making excellent progress towards goals.     Recommendations for follow up therapy are one component of a multi-disciplinary discharge planning process, led by the attending physician.  Recommendations may be updated based on patient status, additional functional criteria and insurance authorization.    Assistance Recommended at Discharge Frequent or constant Supervision/Assistance  Patient can return home with the following  Assistance with cooking/housework;Assist for transportation;Help with stairs or ramp for entrance   Equipment Recommendations  BSC/3in1       Precautions / Restrictions Precautions Precautions: Fall Restrictions Weight Bearing Restrictions: No       Mobility Bed Mobility Overal bed mobility: Needs Assistance Bed Mobility: Supine to Sit, Sit to Supine     Supine to sit: Supervision Sit to supine: Supervision   General bed mobility comments: no physical assistance provided    Transfers Overall transfer level: Modified independent Equipment used: Rolling walker (2 wheels) Transfers: Sit to/from  Stand Sit to Stand: Modified independent (Device/Increase time)                 Balance Overall balance assessment: Modified Independent Sitting-balance support: No upper extremity supported, Feet supported Sitting balance-Leahy Scale: Normal Sitting balance - Comments: steady sitting reaching outside BOS   Standing balance support: Single extremity supported Standing balance-Leahy Scale: Good Standing balance comment: steady standing with single UE support                           ADL either performed or assessed with clinical judgement   ADL                                         General ADL Comments: Pt performed figure four position while seated to don B socks with supervision overall    Extremity/Trunk Assessment Upper Extremity Assessment Upper Extremity Assessment: Generalized weakness   Lower Extremity Assessment Lower Extremity Assessment: Generalized weakness   Cervical / Trunk Assessment Cervical / Trunk Assessment: Normal    Vision Patient Visual Report: No change from baseline            Cognition Arousal/Alertness: Awake/alert Behavior During Therapy: WFL for tasks assessed/performed Overall Cognitive Status: Within Functional Limits for tasks assessed                                                General Comments no drainage noted B groin dressings  Pertinent Vitals/ Pain       Pain Assessment Pain Assessment: 0-10 Pain Score: 3  Pain Location: bilateral groin, R foot Pain Descriptors / Indicators: Sore, Tender, Aching Pain Intervention(s): Limited activity within patient's tolerance, Monitored during session, Repositioned         Frequency  Min 2X/week        Progress Toward Goals  OT Goals(current goals can now be found in the care plan section)  Progress towards OT goals: Progressing toward goals     Plan Discharge plan remains appropriate;Frequency remains appropriate        AM-PAC OT "6 Clicks" Daily Activity     Outcome Measure   Help from another person eating meals?: None Help from another person taking care of personal grooming?: None Help from another person toileting, which includes using toliet, bedpan, or urinal?: A Little Help from another person bathing (including washing, rinsing, drying)?: A Little Help from another person to put on and taking off regular upper body clothing?: None Help from another person to put on and taking off regular lower body clothing?: A Little 6 Click Score: 21    End of Session Equipment Utilized During Treatment: Rolling walker (2 wheels)  OT Visit Diagnosis: Other abnormalities of gait and mobility (R26.89)   Activity Tolerance Patient tolerated treatment well   Patient Left with call bell/phone within reach;in chair   Nurse Communication Mobility status        Time: 1127-1140 OT Time Calculation (min): 13 min  Charges: OT General Charges $OT Visit: 1 Visit OT Treatments $Therapeutic Activity: 8-22 mins  Jackquline Denmark, MS, OTR/L , CBIS ascom [phone]  09/15/22, 12:59 PM

## 2022-09-16 ENCOUNTER — Encounter: Payer: Self-pay | Admitting: Vascular Surgery

## 2022-09-22 ENCOUNTER — Other Ambulatory Visit: Payer: Self-pay | Admitting: Gerontology

## 2022-09-22 ENCOUNTER — Ambulatory Visit
Admission: RE | Admit: 2022-09-22 | Discharge: 2022-09-22 | Disposition: A | Discharge status: Home / Self Care | Payer: Medicare HMO | Source: Ambulatory Visit | Attending: Gerontology | Admitting: Gerontology

## 2022-09-22 DIAGNOSIS — M79604 Pain in right leg: Secondary | ICD-10-CM

## 2022-09-23 ENCOUNTER — Telehealth (INDEPENDENT_AMBULATORY_CARE_PROVIDER_SITE_OTHER): Payer: Self-pay

## 2022-09-23 NOTE — Telephone Encounter (Signed)
Discussed with Lavette, patient has a small DVT in peroneal vein, no intervention indicated.  Because patient was placed on eliquis that is adequate at this time.  Will keep scheduled follow up appointment

## 2022-09-23 NOTE — Telephone Encounter (Signed)
Chelse called from [facility] [place] about William Ball's visit yesterday. He was complaining about pain at his surgical site that he had on 09/10/22 which was a bilateral ENDARTERECTOMY FEMORAL  but only the right side is swelling and in pain. She done an ultrasound and he has  + DVT. She said she started him on Eliquis and stopped him on Pletal. She told him to call and make a follow up appt. If he hasn't called to make an follow up appt, please call and make one for him.

## 2022-09-29 ENCOUNTER — Emergency Department: Payer: Medicare HMO

## 2022-09-29 ENCOUNTER — Inpatient Hospital Stay
Admission: EM | Admit: 2022-09-29 | Discharge: 2022-10-16 | DRG: 381 | Disposition: A | Discharge status: Other Acute Inpatient Hospital | Payer: Medicare HMO | Attending: Internal Medicine | Admitting: Internal Medicine

## 2022-09-29 ENCOUNTER — Inpatient Hospital Stay: Payer: Medicare HMO

## 2022-09-29 DIAGNOSIS — Z87891 Personal history of nicotine dependence: Secondary | ICD-10-CM

## 2022-09-29 DIAGNOSIS — Z83438 Family history of other disorder of lipoprotein metabolism and other lipidemia: Secondary | ICD-10-CM

## 2022-09-29 DIAGNOSIS — I739 Peripheral vascular disease, unspecified: Secondary | ICD-10-CM | POA: Diagnosis present

## 2022-09-29 DIAGNOSIS — E1122 Type 2 diabetes mellitus with diabetic chronic kidney disease: Secondary | ICD-10-CM | POA: Diagnosis present

## 2022-09-29 DIAGNOSIS — K921 Melena: Secondary | ICD-10-CM | POA: Diagnosis not present

## 2022-09-29 DIAGNOSIS — E785 Hyperlipidemia, unspecified: Secondary | ICD-10-CM | POA: Diagnosis present

## 2022-09-29 DIAGNOSIS — E1169 Type 2 diabetes mellitus with other specified complication: Secondary | ICD-10-CM | POA: Diagnosis present

## 2022-09-29 DIAGNOSIS — K449 Diaphragmatic hernia without obstruction or gangrene: Secondary | ICD-10-CM | POA: Diagnosis present

## 2022-09-29 DIAGNOSIS — I152 Hypertension secondary to endocrine disorders: Secondary | ICD-10-CM | POA: Diagnosis present

## 2022-09-29 DIAGNOSIS — L03314 Cellulitis of groin: Secondary | ICD-10-CM | POA: Insufficient documentation

## 2022-09-29 DIAGNOSIS — K2210 Ulcer of esophagus without bleeding: Secondary | ICD-10-CM

## 2022-09-29 DIAGNOSIS — T8149XA Infection following a procedure, other surgical site, initial encounter: Principal | ICD-10-CM

## 2022-09-29 DIAGNOSIS — E1141 Type 2 diabetes mellitus with diabetic mononeuropathy: Secondary | ICD-10-CM | POA: Diagnosis present

## 2022-09-29 DIAGNOSIS — I959 Hypotension, unspecified: Secondary | ICD-10-CM | POA: Diagnosis present

## 2022-09-29 DIAGNOSIS — G4700 Insomnia, unspecified: Secondary | ICD-10-CM | POA: Diagnosis present

## 2022-09-29 DIAGNOSIS — E871 Hypo-osmolality and hyponatremia: Secondary | ICD-10-CM | POA: Diagnosis present

## 2022-09-29 DIAGNOSIS — I10 Essential (primary) hypertension: Secondary | ICD-10-CM | POA: Insufficient documentation

## 2022-09-29 DIAGNOSIS — K5520 Angiodysplasia of colon without hemorrhage: Secondary | ICD-10-CM | POA: Diagnosis present

## 2022-09-29 DIAGNOSIS — K922 Gastrointestinal hemorrhage, unspecified: Secondary | ICD-10-CM | POA: Diagnosis present

## 2022-09-29 DIAGNOSIS — D500 Iron deficiency anemia secondary to blood loss (chronic): Secondary | ICD-10-CM | POA: Diagnosis not present

## 2022-09-29 DIAGNOSIS — Z7984 Long term (current) use of oral hypoglycemic drugs: Secondary | ICD-10-CM | POA: Diagnosis not present

## 2022-09-29 DIAGNOSIS — K2971 Gastritis, unspecified, with bleeding: Secondary | ICD-10-CM | POA: Diagnosis present

## 2022-09-29 DIAGNOSIS — Z888 Allergy status to other drugs, medicaments and biological substances status: Secondary | ICD-10-CM

## 2022-09-29 DIAGNOSIS — N400 Enlarged prostate without lower urinary tract symptoms: Secondary | ICD-10-CM | POA: Diagnosis present

## 2022-09-29 DIAGNOSIS — M25571 Pain in right ankle and joints of right foot: Secondary | ICD-10-CM | POA: Insufficient documentation

## 2022-09-29 DIAGNOSIS — E1151 Type 2 diabetes mellitus with diabetic peripheral angiopathy without gangrene: Secondary | ICD-10-CM | POA: Diagnosis present

## 2022-09-29 DIAGNOSIS — Z1152 Encounter for screening for COVID-19: Secondary | ICD-10-CM | POA: Diagnosis not present

## 2022-09-29 DIAGNOSIS — E1165 Type 2 diabetes mellitus with hyperglycemia: Secondary | ICD-10-CM | POA: Diagnosis present

## 2022-09-29 DIAGNOSIS — I129 Hypertensive chronic kidney disease with stage 1 through stage 4 chronic kidney disease, or unspecified chronic kidney disease: Secondary | ICD-10-CM | POA: Diagnosis present

## 2022-09-29 DIAGNOSIS — Z7902 Long term (current) use of antithrombotics/antiplatelets: Secondary | ICD-10-CM

## 2022-09-29 DIAGNOSIS — F17210 Nicotine dependence, cigarettes, uncomplicated: Secondary | ICD-10-CM | POA: Diagnosis present

## 2022-09-29 DIAGNOSIS — D62 Acute posthemorrhagic anemia: Secondary | ICD-10-CM | POA: Diagnosis present

## 2022-09-29 DIAGNOSIS — Z7901 Long term (current) use of anticoagulants: Secondary | ICD-10-CM

## 2022-09-29 DIAGNOSIS — Z86718 Personal history of other venous thrombosis and embolism: Secondary | ICD-10-CM

## 2022-09-29 DIAGNOSIS — I82451 Acute embolism and thrombosis of right peroneal vein: Secondary | ICD-10-CM | POA: Diagnosis present

## 2022-09-29 DIAGNOSIS — Z79899 Other long term (current) drug therapy: Secondary | ICD-10-CM

## 2022-09-29 DIAGNOSIS — Z794 Long term (current) use of insulin: Secondary | ICD-10-CM

## 2022-09-29 DIAGNOSIS — K219 Gastro-esophageal reflux disease without esophagitis: Secondary | ICD-10-CM | POA: Diagnosis present

## 2022-09-29 DIAGNOSIS — K2211 Ulcer of esophagus with bleeding: Secondary | ICD-10-CM | POA: Diagnosis present

## 2022-09-29 DIAGNOSIS — K2981 Duodenitis with bleeding: Secondary | ICD-10-CM | POA: Diagnosis present

## 2022-09-29 DIAGNOSIS — D72829 Elevated white blood cell count, unspecified: Secondary | ICD-10-CM | POA: Insufficient documentation

## 2022-09-29 DIAGNOSIS — N179 Acute kidney failure, unspecified: Secondary | ICD-10-CM | POA: Diagnosis present

## 2022-09-29 DIAGNOSIS — E663 Overweight: Secondary | ICD-10-CM | POA: Diagnosis present

## 2022-09-29 DIAGNOSIS — N1831 Chronic kidney disease, stage 3a: Secondary | ICD-10-CM | POA: Diagnosis present

## 2022-09-29 DIAGNOSIS — I97638 Postprocedural hematoma of a circulatory system organ or structure following other circulatory system procedure: Secondary | ICD-10-CM | POA: Diagnosis not present

## 2022-09-29 DIAGNOSIS — Z9889 Other specified postprocedural states: Secondary | ICD-10-CM | POA: Diagnosis not present

## 2022-09-29 DIAGNOSIS — E1159 Type 2 diabetes mellitus with other circulatory complications: Secondary | ICD-10-CM | POA: Diagnosis present

## 2022-09-29 DIAGNOSIS — M5431 Sciatica, right side: Secondary | ICD-10-CM | POA: Diagnosis present

## 2022-09-29 DIAGNOSIS — Z6825 Body mass index (BMI) 25.0-25.9, adult: Secondary | ICD-10-CM

## 2022-09-29 DIAGNOSIS — E119 Type 2 diabetes mellitus without complications: Secondary | ICD-10-CM

## 2022-09-29 DIAGNOSIS — Z8249 Family history of ischemic heart disease and other diseases of the circulatory system: Secondary | ICD-10-CM

## 2022-09-29 DIAGNOSIS — Z7982 Long term (current) use of aspirin: Secondary | ICD-10-CM

## 2022-09-29 LAB — CBC WITH DIFFERENTIAL/PLATELET
Abs Immature Granulocytes: 0.38 K/uL — ABNORMAL HIGH (ref 0.00–0.07)
Basophils Absolute: 0.1 K/uL (ref 0.0–0.1)
Basophils Relative: 0 %
Eosinophils Absolute: 1.5 K/uL — ABNORMAL HIGH (ref 0.0–0.5)
Eosinophils Relative: 9 %
HCT: 29.5 % — ABNORMAL LOW (ref 39.0–52.0)
Hemoglobin: 9.8 g/dL — ABNORMAL LOW (ref 13.0–17.0)
Immature Granulocytes: 2 %
Lymphocytes Relative: 25 %
Lymphs Abs: 4.3 K/uL — ABNORMAL HIGH (ref 0.7–4.0)
MCH: 30.3 pg (ref 26.0–34.0)
MCHC: 33.2 g/dL (ref 30.0–36.0)
MCV: 91.3 fL (ref 80.0–100.0)
Monocytes Absolute: 1 K/uL (ref 0.1–1.0)
Monocytes Relative: 6 %
Neutro Abs: 10.1 K/uL — ABNORMAL HIGH (ref 1.7–7.7)
Neutrophils Relative %: 58 %
Platelets: 414 K/uL — ABNORMAL HIGH (ref 150–400)
RBC: 3.23 MIL/uL — ABNORMAL LOW (ref 4.22–5.81)
RDW: 12.6 % (ref 11.5–15.5)
WBC: 17.3 K/uL — ABNORMAL HIGH (ref 4.0–10.5)
nRBC: 0 % (ref 0.0–0.2)

## 2022-09-29 LAB — IRON AND TIBC
Iron: 77 ug/dL (ref 45–182)
Saturation Ratios: 19 % (ref 17.9–39.5)
TIBC: 400 ug/dL (ref 250–450)
UIBC: 323 ug/dL

## 2022-09-29 LAB — FERRITIN: Ferritin: 188 ng/mL (ref 24–336)

## 2022-09-29 LAB — LACTIC ACID, PLASMA: Lactic Acid, Venous: 1.2 mmol/L (ref 0.5–1.9)

## 2022-09-29 LAB — HEPATIC FUNCTION PANEL
ALT: 97 U/L — ABNORMAL HIGH (ref 0–44)
AST: 46 U/L — ABNORMAL HIGH (ref 15–41)
Albumin: 3.8 g/dL (ref 3.5–5.0)
Alkaline Phosphatase: 91 U/L (ref 38–126)
Bilirubin, Direct: <0.1 mg/dL (ref 0.0–0.2)
Total Bilirubin: 0.7 mg/dL (ref 0.3–1.2)
Total Protein: 7.1 g/dL (ref 6.5–8.1)

## 2022-09-29 LAB — BASIC METABOLIC PANEL WITH GFR
Anion gap: 11 (ref 5–15)
BUN: 110 mg/dL — ABNORMAL HIGH (ref 8–23)
CO2: 20 mmol/L — ABNORMAL LOW (ref 22–32)
Calcium: 9.6 mg/dL (ref 8.9–10.3)
Chloride: 98 mmol/L (ref 98–111)
Creatinine, Ser: 1.91 mg/dL — ABNORMAL HIGH (ref 0.61–1.24)
GFR, Estimated: 36 mL/min — ABNORMAL LOW
Glucose, Bld: 170 mg/dL — ABNORMAL HIGH (ref 70–99)
Potassium: 5 mmol/L (ref 3.5–5.1)
Sodium: 129 mmol/L — ABNORMAL LOW (ref 135–145)

## 2022-09-29 LAB — SODIUM, URINE, RANDOM: Sodium, Ur: 34 mmol/L

## 2022-09-29 LAB — RETICULOCYTES
Immature Retic Fract: 16 % — ABNORMAL HIGH (ref 2.3–15.9)
RBC.: 3.34 MIL/uL — ABNORMAL LOW (ref 4.22–5.81)
Retic Count, Absolute: 99.2 K/uL (ref 19.0–186.0)
Retic Ct Pct: 3 % (ref 0.4–3.1)

## 2022-09-29 LAB — CBG MONITORING, ED: Glucose-Capillary: 160 mg/dL — ABNORMAL HIGH (ref 70–99)

## 2022-09-29 LAB — APTT: aPTT: 28 s (ref 24–36)

## 2022-09-29 LAB — PROTIME-INR
INR: 1.3 — ABNORMAL HIGH (ref 0.8–1.2)
Prothrombin Time: 16 s — ABNORMAL HIGH (ref 11.4–15.2)

## 2022-09-29 LAB — OSMOLALITY: Osmolality: 317 mosm/kg — ABNORMAL HIGH (ref 275–295)

## 2022-09-29 LAB — OSMOLALITY, URINE: Osmolality, Ur: 356 mosm/kg (ref 300–900)

## 2022-09-29 LAB — FOLATE: Folate: 26 ng/mL

## 2022-09-29 MED ORDER — VANCOMYCIN VARIABLE DOSE PER UNSTABLE RENAL FUNCTION (PHARMACIST DOSING): Status: DC

## 2022-09-29 MED ORDER — ACETAMINOPHEN 325 MG PO TABS
650.0000 mg | ORAL_TABLET | Freq: Four times a day (QID) | ORAL | Status: AC | PRN
  Filled 2022-09-29: qty 2

## 2022-09-29 MED ORDER — ATORVASTATIN CALCIUM 20 MG PO TABS
40.0000 mg | ORAL_TABLET | Freq: Every day | ORAL | Status: DC
  Administered 2022-09-29 – 2022-10-15 (×17): 40 mg via ORAL
  Filled 2022-09-29 (×17): qty 2

## 2022-09-29 MED ORDER — OXYCODONE-ACETAMINOPHEN 5-325 MG PO TABS: 1.0000 | ORAL_TABLET | ORAL | Status: DC | PRN

## 2022-09-29 MED ORDER — SENNOSIDES-DOCUSATE SODIUM 8.6-50 MG PO TABS: 1.0000 | ORAL_TABLET | Freq: Every evening | ORAL | Status: DC | PRN

## 2022-09-29 MED ORDER — INSULIN DEGLUDEC 100 UNIT/ML ~~LOC~~ SOPN: 30.0000 [IU] | PEN_INJECTOR | Freq: Every day | SUBCUTANEOUS | Status: DC

## 2022-09-29 MED ORDER — PREDNISONE 10 MG PO TABS
5.0000 mg | ORAL_TABLET | Freq: Every day | ORAL | Status: AC
  Administered 2022-09-30 – 2022-10-01 (×2): 5 mg via ORAL
  Filled 2022-09-29 (×2): qty 1

## 2022-09-29 MED ORDER — GABAPENTIN 100 MG PO CAPS
100.0000 mg | ORAL_CAPSULE | Freq: Once | ORAL | Status: AC
  Administered 2022-09-29: 100 mg via ORAL
  Filled 2022-09-29: qty 1

## 2022-09-29 MED ORDER — INSULIN GLARGINE-YFGN 100 UNIT/ML ~~LOC~~ SOLN
30.0000 [IU] | Freq: Every day | SUBCUTANEOUS | Status: DC
  Administered 2022-09-29 – 2022-10-15 (×17): 30 [IU] via SUBCUTANEOUS
  Filled 2022-09-29 (×18): qty 0.3

## 2022-09-29 MED ORDER — SODIUM CHLORIDE 0.9 % IV SOLN
1.0000 g | INTRAVENOUS | Status: DC
  Administered 2022-09-30: 1 g via INTRAVENOUS
  Filled 2022-09-29: qty 10

## 2022-09-29 MED ORDER — MELATONIN 5 MG PO TABS
5.0000 mg | ORAL_TABLET | Freq: Every evening | ORAL | Status: DC | PRN
  Administered 2022-09-29 – 2022-10-04 (×4): 5 mg via ORAL
  Filled 2022-09-29 (×4): qty 1

## 2022-09-29 MED ORDER — ACETAMINOPHEN 650 MG RE SUPP: 650.0000 mg | Freq: Four times a day (QID) | RECTAL | Status: AC | PRN

## 2022-09-29 MED ORDER — PANTOPRAZOLE SODIUM 40 MG IV SOLR
40.0000 mg | Freq: Two times a day (BID) | INTRAVENOUS | Status: AC
  Administered 2022-09-29 – 2022-10-01 (×4): 40 mg via INTRAVENOUS
  Filled 2022-09-29 (×4): qty 10

## 2022-09-29 MED ORDER — VANCOMYCIN HCL IN DEXTROSE 1-5 GM/200ML-% IV SOLN
1000.0000 mg | Freq: Once | INTRAVENOUS | Status: AC
  Administered 2022-09-29: 1000 mg via INTRAVENOUS
  Filled 2022-09-29: qty 200

## 2022-09-29 MED ORDER — INSULIN ASPART 100 UNIT/ML IJ SOLN
0.0000 [IU] | Freq: Every day | INTRAMUSCULAR | Status: DC
  Administered 2022-09-29: 0 [IU] via SUBCUTANEOUS
  Administered 2022-10-01 – 2022-10-14 (×6): 2 [IU] via SUBCUTANEOUS
  Administered 2022-10-15: 3 [IU] via SUBCUTANEOUS
  Filled 2022-09-29 (×7): qty 1

## 2022-09-29 MED ORDER — CARVEDILOL 3.125 MG PO TABS
3.1250 mg | ORAL_TABLET | Freq: Two times a day (BID) | ORAL | Status: DC
  Administered 2022-09-29 – 2022-10-16 (×32): 3.125 mg via ORAL
  Filled 2022-09-29 (×32): qty 1

## 2022-09-29 MED ORDER — FOLIC ACID 1 MG PO TABS
1.0000 mg | ORAL_TABLET | Freq: Every day | ORAL | Status: DC
  Administered 2022-09-30 – 2022-10-16 (×17): 1 mg via ORAL
  Filled 2022-09-29 (×17): qty 1

## 2022-09-29 MED ORDER — HEPARIN (PORCINE) 25000 UT/250ML-% IV SOLN
950.0000 [IU]/h | INTRAVENOUS | Status: DC
  Administered 2022-09-29: 950 [IU]/h via INTRAVENOUS
  Filled 2022-09-29: qty 250

## 2022-09-29 MED ORDER — ALBUTEROL SULFATE (2.5 MG/3ML) 0.083% IN NEBU: 3.0000 mL | INHALATION_SOLUTION | Freq: Four times a day (QID) | RESPIRATORY_TRACT | Status: DC | PRN

## 2022-09-29 MED ORDER — NICOTINE 21 MG/24HR TD PT24
21.0000 mg | MEDICATED_PATCH | Freq: Every day | TRANSDERMAL | Status: DC | PRN
  Administered 2022-09-29: 21 mg via TRANSDERMAL
  Filled 2022-09-29: qty 1

## 2022-09-29 MED ORDER — SODIUM CHLORIDE 0.9 % IV SOLN
2.0000 g | Freq: Once | INTRAVENOUS | Status: AC
  Administered 2022-09-29: 2 g via INTRAVENOUS
  Filled 2022-09-29: qty 20

## 2022-09-29 MED ORDER — INSULIN ASPART 100 UNIT/ML IJ SOLN
0.0000 [IU] | Freq: Three times a day (TID) | INTRAMUSCULAR | Status: DC
  Administered 2022-09-30 – 2022-10-01 (×5): 1 [IU] via SUBCUTANEOUS
  Administered 2022-10-02: 2 [IU] via SUBCUTANEOUS
  Administered 2022-10-02: 1 [IU] via SUBCUTANEOUS
  Administered 2022-10-03: 2 [IU] via SUBCUTANEOUS
  Administered 2022-10-05: 1 [IU] via SUBCUTANEOUS
  Administered 2022-10-06 – 2022-10-07 (×3): 2 [IU] via SUBCUTANEOUS
  Administered 2022-10-07 – 2022-10-08 (×2): 1 [IU] via SUBCUTANEOUS
  Administered 2022-10-08: 2 [IU] via SUBCUTANEOUS
  Administered 2022-10-08: 1 [IU] via SUBCUTANEOUS
  Administered 2022-10-09 – 2022-10-11 (×5): 2 [IU] via SUBCUTANEOUS
  Administered 2022-10-11: 1 [IU] via SUBCUTANEOUS
  Administered 2022-10-12: 2 [IU] via SUBCUTANEOUS
  Administered 2022-10-12: 3 [IU] via SUBCUTANEOUS
  Administered 2022-10-13: 2 [IU] via SUBCUTANEOUS
  Administered 2022-10-13: 3 [IU] via SUBCUTANEOUS
  Administered 2022-10-14: 2 [IU] via SUBCUTANEOUS
  Administered 2022-10-14: 1 [IU] via SUBCUTANEOUS
  Administered 2022-10-15 (×2): 2 [IU] via SUBCUTANEOUS
  Administered 2022-10-16: 1 [IU] via SUBCUTANEOUS
  Filled 2022-09-29 (×29): qty 1

## 2022-09-29 MED ORDER — ADULT MULTIVITAMIN W/MINERALS CH
1.0000 | ORAL_TABLET | Freq: Every day | ORAL | Status: DC
  Administered 2022-09-30 – 2022-10-16 (×17): 1 via ORAL
  Filled 2022-09-29 (×17): qty 1

## 2022-09-29 MED ORDER — PANTOPRAZOLE SODIUM 40 MG IV SOLR
40.0000 mg | Freq: Once | INTRAVENOUS | Status: AC
  Administered 2022-09-29: 40 mg via INTRAVENOUS
  Filled 2022-09-29: qty 10

## 2022-09-29 MED ORDER — SODIUM CHLORIDE 0.9 % IV SOLN: INTRAVENOUS | Status: AC

## 2022-09-29 MED ORDER — ONDANSETRON HCL 4 MG PO TABS: 4.0000 mg | ORAL_TABLET | Freq: Four times a day (QID) | ORAL | Status: AC | PRN

## 2022-09-29 MED ORDER — SENNOSIDES-DOCUSATE SODIUM 8.6-50 MG PO TABS
2.0000 | ORAL_TABLET | Freq: Two times a day (BID) | ORAL | Status: DC
  Administered 2022-09-30 – 2022-10-16 (×30): 2 via ORAL
  Filled 2022-09-29 (×33): qty 2

## 2022-09-29 MED ORDER — GABAPENTIN 100 MG PO CAPS
100.0000 mg | ORAL_CAPSULE | Freq: Three times a day (TID) | ORAL | Status: DC
  Administered 2022-09-30 – 2022-10-16 (×49): 100 mg via ORAL
  Filled 2022-09-29 (×49): qty 1

## 2022-09-29 MED ORDER — THIAMINE MONONITRATE 100 MG PO TABS
100.0000 mg | ORAL_TABLET | Freq: Every day | ORAL | Status: DC
  Administered 2022-09-30 – 2022-10-16 (×17): 100 mg via ORAL
  Filled 2022-09-29 (×16): qty 1

## 2022-09-29 MED ORDER — SODIUM CHLORIDE 0.9 % IV BOLUS
500.0000 mL | Freq: Once | INTRAVENOUS | Status: AC
  Administered 2022-09-29: 500 mL via INTRAVENOUS

## 2022-09-29 MED ORDER — ONDANSETRON HCL 4 MG/2ML IJ SOLN: 4.0000 mg | Freq: Four times a day (QID) | INTRAMUSCULAR | Status: AC | PRN

## 2022-09-29 MED ORDER — OXYCODONE-ACETAMINOPHEN 5-325 MG PO TABS
1.0000 | ORAL_TABLET | Freq: Four times a day (QID) | ORAL | Status: DC | PRN
  Administered 2022-09-30 – 2022-10-07 (×21): 1 via ORAL
  Filled 2022-09-29 (×21): qty 1

## 2022-09-29 NOTE — Assessment & Plan Note (Signed)
-   Atorvastatin 40 mg daily resumed 

## 2022-09-29 NOTE — Progress Notes (Signed)
ANTICOAGULATION CONSULT NOTE   Pharmacy Consult for IV heparin Indication: history of right occlusive peroneal vein dvt on 09/22/22  Allergies  Allergen Reactions   Amlodipine Itching    Patient Measurements: Height: 5\' 9"  (175.3 cm) Weight: 81.2 kg (179 lb) IBW/kg (Calculated) : 70.7 Heparin Dosing Weight: 81.2 kg  Vital Signs: Temp: 97.7 F (36.5 C) (05/27 1454) Temp Source: Oral (05/27 1454) BP: 133/57 (05/27 1658) Pulse Rate: 57 (05/27 1658)  Labs: Recent Labs    09/29/22 1457  HGB 9.8*  HCT 29.5*  PLT 414*  CREATININE 1.91*    Estimated Creatinine Clearance: 32.4 mL/min (A) (by C-G formula based on SCr of 1.91 mg/dL (H)).   Medical History: Past Medical History:  Diagnosis Date   Diabetes mellitus without complication (HCC)    borderline   Hepatomegaly    Hyperlipidemia    Hypertension    Substance abuse (HCC)     Medications:  Eliquis prior to admission (last dose 5/27 @0800 )  Assessment: 77 year old male admitted with possible GI bleed. DVT on 09/22/2022.  Hgb 9.8  Goal of Therapy:  aPTT 66-85 seconds NO BOLUS Protocol Monitor platelets by anticoagulation protocol: Yes   Plan: Double checked - confirmed with MD -wants to continue anticoagulation at this time. Dosing conservatively. No boluses. Heparin 950 units /hr. aPTT in 8 hours CBC at least daily    Elliot Gurney, PharmD, BCPS Clinical Pharmacist  09/29/2022 8:56 PM

## 2022-09-29 NOTE — ED Provider Notes (Signed)
[facility] Provider Note    Event Date/Time   First MD Initiated Contact with Patient 09/29/22 1626     (approximate)   History   Hypotension   HPI  William Ball is a 77 y.o. male  PAD, hypertension, hyperlipidemia, non-insulin-dependent diabetes mellitus, who was admitted status post the procedure on 09/04/2022 for lower extremity angiography with bilateral iliac stents on 09/10/2022 continued on aspirin, Plavix who comes in today with concerns for low blood pressure.  Patient reports having some low blood pressures at home with some concern for some nausea.  Patient immediately denies any nausea or vomiting.  He denies any current headaches.  He states that he just felt like his blood pressure was low as he records it every day and it was running 110 systolic or typically it runs 161W to 150s.  He does report some redness and drainage for the past 3 days of his left groin.  He denies any significant pain associated with it.  He denies any new chest pain, shortness of breath or other concerns.  Denies any known fevers.  Patient also reports over the past few days having black stool.  He does report taking some laxative so was not sure if it was related to that.  He reports it is very liquidy and dark in color.  Denies having this previously.  I reviewed the records where patient had procedure done on 5/8 with Dr. Wyn Quaker.  I also reviewed where he already underwent ultrasound venous to rule out DVTs on 5/20 on the right leg that was positive for postoperative seroma or hematoma as well as did see a segmental occlusive DVT.  Patient is on Eliquis.   Physical Exam   Triage Vital Signs: ED Triage Vitals [09/29/22 1454]  Enc Vitals Group     BP 120/62     Pulse Rate 71     Resp 16     Temp 97.7 F (36.5 C)     Temp Source Oral     SpO2 98 %     Weight 179 lb (81.2 kg)     Height 5\' 9"  (1.753 m)     Head Circumference      Peak Flow      Pain Score 0     Pain Loc       Pain Edu?      Excl. in GC?     Most recent vital signs: Vitals:   09/29/22 1454  BP: 120/62  Pulse: 71  Resp: 16  Temp: 97.7 F (36.5 C)  SpO2: 98%     General: Awake, no distress.  CV:  Good peripheral perfusion.  Resp:  Normal effort.  Abd:  No distention.  Soft and nontender Other:  Pulses palpated distally bilaterally.  He is got some redness noted with staples still in place of the left groin with a little bit of clear drainage noted from it. Rectal exam, positive melena confirmed Hemoccult positive  ED Results / Procedures / Treatments   Labs (all labs ordered are listed, but only abnormal results are displayed) Labs Reviewed  BASIC METABOLIC PANEL - Abnormal; Notable for the following components:      Result Value   Sodium 129 (*)    CO2 20 (*)    Glucose, Bld 170 (*)    BUN 110 (*)    Creatinine, Ser 1.91 (*)    GFR, Estimated 36 (*)    All other components within normal limits  CBC WITH DIFFERENTIAL/PLATELET - Abnormal; Notable for the following components:   WBC 17.3 (*)    RBC 3.23 (*)    Hemoglobin 9.8 (*)    HCT 29.5 (*)    Platelets 414 (*)    Neutro Abs 10.1 (*)    Lymphs Abs 4.3 (*)    Eosinophils Absolute 1.5 (*)    Abs Immature Granulocytes 0.38 (*)    All other components within normal limits     RADIOLOGY  Pending-    PROCEDURES:  Critical Care performed: No  Procedures   MEDICATIONS ORDERED IN ED: Medications  sodium chloride 0.9 % bolus 500 mL (has no administration in time range)  vancomycin (VANCOCIN) IVPB 1000 mg/200 mL premix (has no administration in time range)  cefTRIAXone (ROCEPHIN) 2 g in sodium chloride 0.9 % 100 mL IVPB (has no administration in time range)  pantoprazole (PROTONIX) injection 40 mg (has no administration in time range)  acetaminophen (TYLENOL) tablet 650 mg (has no administration in time range)    Or  acetaminophen (TYLENOL) suppository 650 mg (has no administration in time range)   ondansetron (ZOFRAN) tablet 4 mg (has no administration in time range)    Or  ondansetron (ZOFRAN) injection 4 mg (has no administration in time range)  senna-docusate (Senokot-S) tablet 1 tablet (has no administration in time range)  gabapentin (NEURONTIN) capsule 100 mg (100 mg Oral Given 09/29/22 1640)     IMPRESSION / MDM / ASSESSMENT AND PLAN / ED COURSE  I reviewed the triage vital signs and the nursing notes.   Patient's presentation is most consistent with acute presentation with potential threat to life or bodily function.   Patient comes in with concerns for left groin infection.  This looks consistent with cellulitis will get ultrasound to rule out any kind of pseudoaneurysm, abscess.  I did message vascular surgery so they are aware of patient Dr Myra Gianotti.  I have also am concerned about potential GI bleed given concern for melena on exam currently hemodynamically stable will add on type and screen start Protonix.  Recent normal LFTs so doubt cirrhosis.  Patient given some fluid due to concern for some dehydration.  Patient was started on Vanco, ceftriaxone.  Does not meet sepsis criteria at this time but blood cultures and lactate were ordered.  I did discuss with the hospital team for admission  I did add on ultrasounds of the legs again to see if the clot burden has changed or worsen given the concern for potential GI bleed to see.    FINAL CLINICAL IMPRESSION(S) / ED DIAGNOSES   Final diagnoses:  Cellulitis of drainage site, post-operative  Gastrointestinal hemorrhage, unspecified gastrointestinal hemorrhage type     Rx / DC Orders   ED Discharge Orders     None        Note:  This document was prepared using Dragon voice recognition software and may include unintentional dictation errors.   Concha Se, MD 09/29/22 [phone]

## 2022-09-29 NOTE — Assessment & Plan Note (Addendum)
I suspect this is multifactorial in setting of upper GI bleed concerns Status post sodium chloride 500 mL bolus, I have ordered sodium chloride infusion at 100 mL/h, 5 hours ordered On CKD 3A Serum creatinine range has been 1.35-1.6, over the last 3 weeks

## 2022-09-29 NOTE — Assessment & Plan Note (Addendum)
With marked elevated leukocytosis, check urine Legionella Check urine sodium, urine osmolality, serum osmolality Status post sodium chloride 500 mL bolus per EDP Ordered sodium chloride 100 mL/h, 5 hours ordered

## 2022-09-29 NOTE — ED Triage Notes (Signed)
Pt c/o hypotension at home and nausea after having stent placement 2 weeks ago for blood clots in his left leg. Pt incision is still intact with staple, just redness at the site

## 2022-09-29 NOTE — Assessment & Plan Note (Addendum)
On prednisone taper, end date 10/01/2022 CBC in the AM

## 2022-09-29 NOTE — Progress Notes (Signed)
Pharmacy Antibiotic Note  William Ball is a 77 y.o. male admitted on 09/29/2022 with  Purulent cellulitis in pt with DM2, at site of vasular access for bilateral enterectomy and iliac stent placement .  Pharmacy has been consulted for vancomycin dosing.  Scr 1.91 above baseline  Plan: Vancomycin 1,000 mg dose to complete 2 gram load  Follow up renal function in AM  Height: 5\' 9"  (175.3 cm) Weight: 81.2 kg (179 lb) IBW/kg (Calculated) : 70.7  Temp (24hrs), Avg:97.9 F (36.6 C), Min:97.7 F (36.5 C), Max:98.4 F (36.9 C)  Recent Labs  Lab 09/29/22 1457 09/29/22 1827  WBC 17.3*  --   CREATININE 1.91*  --   LATICACIDVEN  --  1.2    Estimated Creatinine Clearance: 32.4 mL/min (A) (by C-G formula based on SCr of 1.91 mg/dL (H)).    Allergies  Allergen Reactions   Amlodipine Itching    Antimicrobials this admission: vancomycin 5/27 >>    Dose adjustments this admission: N/a  Microbiology results: 5/27 BCx: in process   Thank you for allowing pharmacy to be a part of this patient's care.  Jaynie Bream 09/29/2022 9:48 PM

## 2022-09-29 NOTE — Hospital Course (Signed)
[name] is a 77 year old male with history of hypertension, hyperlipidemia, tobacco use, non-insulin-dependent diabetes mellitus, constipation, PAD status post bilateral femoral enterectomy and bilateral iliac stents placed on 09/05/2022, history of occlusive right peroneal vein DVT, who presents emergency department for chief concerns of hypotension and weakness.  Vitals in the ED showed temperature of 97.7, respiration rate of 16, heart rate of 71, blood pressure 120/62, SpO2 98% on room air.  Serum sodium is 129, potassium 5.0, chloride 98, bicarb 20, BUN of 110, serum creatinine 1.91, EGFR 36, nonfasting blood glucose 170, WBC 17.3, hemoglobin 9.8, platelets of 414.  ED treatment: Protonix 40 mg IV, gabapentin 100 mg p.o., ceftriaxone 2 g IV, sodium chloride 500 mL bolus, vancomycin

## 2022-09-29 NOTE — Progress Notes (Signed)
PHARMACY -  BRIEF ANTIBIOTIC NOTE   Pharmacy has received consult(s) for vancomycin from an ED provider.  The patient's profile has been reviewed for ht/wt/allergies/indication/available labs.    One time order(s) placed for vancomycin 1,000 mg x 1  Further antibiotics/pharmacy consults should be ordered by admitting physician if indicated.                       Thank you,  Elliot Gurney, PharmD, BCPS Clinical Pharmacist  09/29/2022 5:22 PM

## 2022-09-29 NOTE — Assessment & Plan Note (Signed)
-   This complicates overall care and prognosis.  

## 2022-09-29 NOTE — H&P (Addendum)
History and Physical   [name] ZOX:[id number] DOB: 11/23/1945 DOA: 09/29/2022  PCP: William Axe, NP  Outpatient Specialists: Dr. Gilda Ball, vascular surgery Patient coming from: Home  I have personally briefly reviewed patient's old medical records in [facility] EMR.  Chief Concern: Hypotension, nausea, weakness  HPI: [name] is a 77 year old male with history of hypertension, hyperlipidemia, tobacco use, non-insulin-dependent diabetes mellitus, constipation, PAD status post bilateral femoral enterectomy and bilateral iliac stents placed on 09/05/2022, history of occlusive right peroneal vein DVT, who presents emergency department for chief concerns of hypotension and weakness.  Vitals in the ED showed temperature of 97.7, respiration rate of 16, heart rate of 71, blood pressure 120/62, SpO2 98% on room air.  Serum sodium is 129, potassium 5.0, chloride 98, bicarb 20, BUN of 110, serum creatinine 1.91, EGFR 36, nonfasting blood glucose 170, WBC 17.3, hemoglobin 9.8, platelets of 414.  ED treatment: Protonix 40 mg IV, gabapentin 100 mg p.o., ceftriaxone 2 g IV, sodium chloride 500 mL bolus, vancomycin ------------------------------ At bedside, he was able to tell me his name, age, current calendar year, current locatin. He reports weakness and hypotension that he noticed today.  He reports his blood pressure normally runs in the 130s and today is was in the low 120s.  He endorses weakness in the last 2 days with poor p.o. intake in the last 2 days.  He reports right ankle, started to hurt since discharge from hsoptial. He has been trying epsom salt which did help.  He reports three days of melena stool. He denies taking iron tablets and nsaids at home. He is one aspirin, plavix, and eliquis at home.   Social history: He lives with wife and son. He smokes 3/4 (15 cigarettes per day). He infrequently drinks etoh. He denies recreational drug use. He is retired and formerly was a  [employer], Radio producer.    ROS: Constitutional: no weight change, no fever ENT/Mouth: no sore throat, no rhinorrhea Eyes: no eye pain, no vision changes Cardiovascular: no chest pain, no dyspnea,  no edema, no palpitations Respiratory: no cough, no sputum, no wheezing Gastrointestinal: no nausea, no vomiting, no diarrhea, no constipation, + melena stool Genitourinary: no urinary incontinence, no dysuria, no hematuria Musculoskeletal: no arthralgias, no myalgias Skin: no skin lesions, no pruritus, Neuro: + weakness, no loss of consciousness, no syncope Psych: no anxiety, no depression, + decrease appetite Heme/Lymph: no bruising, no bleeding  ED Course: Discussed with emergency medicine provider, patient requiring hospitalization for chief concerns of acute on chronic anemia concerning for GI bleed on Eliquis for right peroneal vein DVT.  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 Peripheral IV order for nursing to ensure  and maintain 2 PIV, prefer large-bore Gastroenterology specialist has been consulted via secure chat and Epic order Adm GI it to telemetry cardiac, inpatient  Right ankle pain He denies trauma He reports he has  been hurting since 09/15/2022 Right ankle x-ray 2 views ordered Oxycodone-acetaminophen 5-325 mg p.o. every 6 hours as needed for severe, moderate pain, 2 days ordered  Insulin dependent type 2 diabetes mellitus (HCC) Home long-acting insulin 30 units nightly resumed Home glipizide and Jardiance not resumed on admission Insulin SSI with at bedtime coverage ordered  Essential hypertension Home valsartan 220 mg daily, spironolactone 25 mg daily, were not resumed on admission due to mild low normotensive on admission Home Coreg 6.25 mg p.o. 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  Cellulitis of left groin Status post ceftriaxone 2 g IV one-time dose and vancomycin per EDP Continue with ceftriaxone 1 g IV daily Vascular has been consulted  Leukocytosis On prednisone taper, end date 10/01/2022 CBC in the AM  AKI (acute kidney injury) (HCC) I suspect this is multifactorial in setting of upper GI bleed concerns Status post sodium chloride 500 mL bolus, I have ordered sodium chloride infusion at 100 mL/h, 5 hours ordered On CKD 3A Serum creatinine range has been 1.35-1.6, over the last 3 weeks  Overweight (BMI 25.0-29.9) This complicates overall care and prognosis.   Hyponatremia With marked elevated leukocytosis, check urine Legionella Check urine sodium, urine osmolality, serum osmolality Status post sodium chloride 500 mL bolus per EDP Ordered sodium chloride 100 mL/h, 5 hours ordered  Personal history of tobacco use, presenting hazards to health Patient endorses readiness to stop tobacco use. Greater than 5 minutes spent on tobacco cessation counseling Week one, smoke 14 cigarettes per day. Week two,  smoke 13 cigarettes per day. Week three, smoke 12 cigarettes per day, continue until smoking half the amount of cigarettes per day Discuss with PCP for pharmacologic assistance with smoking cessation Clean all indoor clothing, sheets, blankets, and freshen textile furniture to rid the smell of cigarettes Only smoke outside and wear outer covering Leave cigarettes and lighters outside in separate places During in-between cigarettes, if you feel the urge to smoke, use the following: stress squeezing devices/phone a trusted friend to talk you through the urge/walk in a safe environment Avoid prolong interactions with individuals actively smoking cigarettes If you smoke in social setting, avoid social setting where cigarette smoking is expected or considered acceptable  Call 1800 QUIT NOW if in need of nicotine patches to help with cessation  BPH (benign prostatic hyperplasia) Home terazosin not resumed on admission due to hypotension and concerns of upper GI bleed  Hyperlipidemia Atorvastatin 40 mg daily resumed  Insomnia-patient asking for melatonin; melatonin 5 mg nightly as needed for sleep ordered  Sciatic nerve pain for 09/22/2022 outpatient clinic visit-patient was prescribed prednisone taper; gabapentin 100 mg 3 times daily - Prednisone taper for completion and gabapentin resumed on admission  Chart reviewed.   DVT prophylaxis: Heparin GGT Code Status: Full code Diet: Clear liquids Family Communication: A phone call was offered, patient declined states that his wife already knows he is in the hospital Disposition Plan: Pending clinical course, guarded prognosis Consults called: Vascular, gastroenterology Admission status: Telemetry cardiac, inpatient  Past Medical History:  Diagnosis Date   Diabetes mellitus without complication (HCC)    borderline   Hepatomegaly    Hyperlipidemia    Hypertension    Substance abuse (HCC)    Past Surgical History:  Procedure Laterality Date    ANGIOPLASTY     APPENDECTOMY     BONE MARROW TRANSPLANT Left    ankle.  patient unsure of this but thinks this is what happened   CARDIAC SURGERY     CATARACT EXTRACTION W/PHACO Left 11/13/2015   Procedure: CATARACT EXTRACTION PHACO AND INTRAOCULAR LENS PLACEMENT (IOC);  Surgeon: Galen Manila, MD;  Location: [facility] ORS;  Service: Ophthalmology;  Laterality: Left;  [place] 1.06 AP% 23.9 CDE 15.81 Fluid pack lot # [id number]   CATARACT EXTRACTION W/PHACO Right 11/27/2015   Procedure: CATARACT EXTRACTION PHACO AND INTRAOCULAR LENS PLACEMENT (IOC);  Surgeon: Galen Manila, MD;  Location: [facility] ORS;  Service: Ophthalmology;  Laterality: Right;  [place] 1.06 AP% 23.8 CDE 15.80 Fluid pack lot # [id number]   COLONOSCOPY  2012   polyps, diverticulosis , 17yr repeat   COLONOSCOPY WITH PROPOFOL N/A 07/17/2016   Procedure: COLONOSCOPY WITH PROPOFOL;  Surgeon: Wyline Mood, MD;  Location: [facility];  Service: Endoscopy;  Laterality: N/A;   ENDARTERECTOMY FEMORAL Bilateral 09/10/2022   Procedure: ENDARTERECTOMY FEMORAL;  Surgeon: Annice Needy, MD;  Location: [facility] ORS;  Service: Vascular;  Laterality: Bilateral;   FRACTURE SURGERY Left 1982   foot, leg.unsure if metal in these parts.possibly replaced ankle   INSERTION OF ILIAC STENT Bilateral 09/10/2022   Procedure: INSERTION OF ILIAC STENT;  Surgeon: Annice Needy, MD;  Location: [facility];  Service: Vascular;  Laterality: Bilateral;   LOWER EXTREMITY ANGIOGRAPHY Right 09/04/2022   Procedure: Lower Extremity Angiography;  Surgeon: Annice Needy, MD;  Location: [facility];  Service: Cardiovascular;  Laterality: Right;   PERIPHERAL VASCULAR CATHETERIZATION Left 12/27/2015   Procedure: Lower Extremity Angiography;  Surgeon: Annice Needy, MD;  Location: [facility];  Service: Cardiovascular;  Laterality: Left;   Social History:  reports that he has been smoking cigarettes. He has a 55.00 pack-year smoking history. He has never used smokeless tobacco. He  reports current alcohol use of about 28.0 standard drinks of alcohol per week. He reports that he does not use drugs.  Allergies  Allergen Reactions   Amlodipine Itching   Family History  Problem Relation Age of Onset   Cancer Mother    Heart disease Mother    Hyperlipidemia Mother    Heart disease Father    Hyperlipidemia Father    Family history: Family history reviewed and not pertinent  Prior to Admission medications   Medication Sig Start Date End Date Taking? Authorizing Provider  albuterol (VENTOLIN HFA) 108 (90 Base) MCG/ACT inhaler Inhale into the lungs. 05/23/21   [provider]  Aspirin 81 MG CAPS Take by mouth.    [provider]  atorvastatin (LIPITOR) 20 MG tablet TAKE 1 TABLET BY MOUTH EVERY DAY 04/15/20   Johnson, Megan P, DO  carvedilol (COREG) 3.125 MG tablet Take 3.125 mg by mouth 2 (two) times daily. 04/09/22   [provider]  chlorthalidone (HYGROTON) 50 MG tablet Take 1 tablet (50 mg total) by mouth daily. MUST BE SEEN IN CLINIC FOR FURTHER REFILLS ON MEDICATION 05/27/21   Debbe Odea, MD  cilostazol (PLETAL) 50 MG tablet Take 50 mg by mouth 2 (two) times daily. 08/13/22   [provider]  clopidogrel (PLAVIX) 75 MG tablet Take 1 tablet (  75 mg total) by mouth daily. 09/16/22 12/15/22  Darlin Priestly, MD  folic acid (FOLVITE) 1 MG tablet Take 1 tablet (1 mg total) by mouth daily. 09/16/22   Darlin Priestly, MD  glipiZIDE (GLUCOTROL) 10 MG tablet TAKE 1 TABLET (10 MG TOTAL) BY MOUTH 2 (TWO) TIMES DAILY BEFORE A MEAL. 04/09/20   Johnson, Megan P, DO  JARDIANCE 10 MG TABS tablet Take 10 mg by mouth daily.    [provider]  Multiple Vitamin (MULTIVITAMIN WITH MINERALS) TABS tablet Take 1 tablet by mouth daily. 09/16/22   Darlin Priestly, MD  nicotine (NICODERM CQ - DOSED IN MG/24 HOURS) 21 mg/24hr patch Place 1 patch (21 mg total) onto the skin daily as needed (nicotine craving). 09/15/22   Darlin Priestly, MD  Omega-3 Fatty Acids (FISH OIL PO)  Take by mouth daily.    [provider]  polyethylene glycol (MIRALAX / GLYCOLAX) 17 g packet Take 17 g by mouth 2 (two) times daily. 09/15/22   Darlin Priestly, MD  senna-docusate (SENOKOT-S) 8.6-50 MG tablet Take 2 tablets by mouth 2 (two) times daily. 09/15/22   Darlin Priestly, MD  spironolactone (ALDACTONE) 25 MG tablet Take 1 tablet by mouth daily. 06/30/22   [provider]  terazosin (HYTRIN) 2 MG capsule Take 2 mg by mouth at bedtime.    [provider]  thiamine (VITAMIN B-1) 100 MG tablet Take 1 tablet (100 mg total) by mouth daily. 09/16/22   Darlin Priestly, MD  TRESIBA FLEXTOUCH 100 UNIT/ML FlexTouch Pen Inject 30 Units into the skin at bedtime. 08/06/22   [provider]  valsartan (DIOVAN) 320 MG tablet 160 mg. 04/15/22 04/15/23  [provider]   Physical Exam: Vitals:   09/29/22 1634 09/29/22 1658 09/29/22 1858 09/29/22 2003  BP:  (!) 133/57  (!) 159/58  Pulse: 62 (!) 57  63  Resp: 19 20  20   Temp:   97.7 F (36.5 C) 98.4 F (36.9 C)  TempSrc:   Oral Oral  SpO2: 100% 100%  100%  Weight:      Height:       Constitutional: appears age-appropriate, frail, NAD, calm Eyes: PERRL, lids and conjunctivae normal ENMT: Mucous membranes are moist. Posterior pharynx clear of any exudate or lesions. Age-appropriate dentition. Hearing appropriate Neck: normal, supple, no masses, no thyromegaly Respiratory: clear to auscultation bilaterally, no wheezing, no crackles. Normal respiratory effort. No accessory muscle use.  Cardiovascular: Regular rate and rhythm, no murmurs / rubs / gallops. No extremity edema. 2+ pedal pulses. No carotid bruits.  Abdomen: no tenderness, no masses palpated, no hepatosplenomegaly. Bowel sounds positive.  Musculoskeletal: no clubbing / cyanosis. No joint deformity upper and lower extremities. Good ROM, no contractures, no atrophy. Normal muscle tone.  Skin: no rashes, lesions, ulcers. No induration.  Bilateral groin incision with  sutures.  Redness and increased warmth in the left groin  Neurologic: Sensation intact. Strength 5/5 in all 4.  Psychiatric: Normal judgment and insight. Alert and oriented x 3.  Depressed mood.   EKG: Ordered pending completion  Chest x-ray on Admission: I personally reviewed and I agree with radiologist reading as below.  DG Ankle 2 Views Right  Result Date: 09/29/2022 CLINICAL DATA:  [id number] with ankle pain and purulent cellulitis. EXAM: RIGHT ANKLE - 2 VIEW COMPARISON:  None Available. FINDINGS: There is no evidence of fracture, dislocation, or joint effusion. There is no evidence of arthropathy or acute focal bone abnormality. Moderate posterior and plantar calcaneal enthesopathic spurring is noted.  Mild anterior soft tissue fullness is noted with otherwise unremarkable soft tissues. IMPRESSION: 1. Mild anterior soft tissue fullness without underlying osseous abnormality. 2. Moderate posterior and plantar calcaneal enthesopathic spurring. Electronically Signed   By: Almira Bar M.D.   On: 09/29/2022 22:13   [place] Lower Ext Art Left Ltd  Result Date: 09/29/2022 CLINICAL DATA:  Recent endovascular stent placement. Redness and drainage at incision site in left groin. EXAM: LEFT LOWER EXTREMITY ARTERIAL DUPLEX SCAN TECHNIQUE: Gray-scale sonography as well as color Doppler and duplex ultrasound was performed to evaluate the left common femoral artery, superficial femoral artery and profunda femoral artery in the area of concern COMPARISON:  None Available. FINDINGS: Left lower Extremity No pseudoaneurysm seen. No visible AV fistula. Hypoechoic material is seen in the left groin adjacent to the vessels and in the left groin, likely small hematoma. Area in the left groin measures approximately 1 cm in maximum diameter. IMPRESSION: No evidence of pseudoaneurysm or AV fistula. Probable small left groin hematoma. Electronically Signed   By: Charlett Nose M.D.   On: 09/29/2022 21:06   US Venous Img Lower  Bilateral  Result Date: 09/29/2022 CLINICAL DATA:  Leg swelling.  Follow-up DVT. EXAM: BILATERAL LOWER EXTREMITY VENOUS DOPPLER ULTRASOUND TECHNIQUE: Gray-scale sonography with graded compression, as well as color Doppler and duplex ultrasound were performed to evaluate the lower extremity deep venous systems from the level of the common femoral vein and including the common femoral, femoral, profunda femoral, popliteal and calf veins including the posterior tibial, peroneal and gastrocnemius veins when visible. The superficial great saphenous vein was also interrogated. Spectral Doppler was utilized to evaluate flow at rest and with distal augmentation maneuvers in the common femoral, femoral and popliteal veins. COMPARISON:  Right lower extremity duplex 09/22/2022. FINDINGS: RIGHT LOWER EXTREMITY Common Femoral Vein: No evidence of thrombus. Normal compressibility, respiratory phasicity and response to augmentation. Saphenofemoral Junction: No evidence of thrombus. Normal compressibility and flow on color Doppler imaging. Profunda Femoral Vein: No evidence of thrombus. Normal compressibility and flow on color Doppler imaging. Femoral Vein: No evidence of thrombus. Normal compressibility, respiratory phasicity and response to augmentation. Popliteal Vein: No evidence of thrombus. Normal compressibility, respiratory phasicity and response to augmentation. Calf Veins: Again seen occlusion of the peroneal vein. Posterior tibial vein is patent. Superficial Great Saphenous Vein: No evidence of thrombus. Normal compressibility. Venous Reflux:  None. Other Findings:  None. LEFT LOWER EXTREMITY Common Femoral Vein: No evidence of thrombus. Normal compressibility, respiratory phasicity and response to augmentation. Saphenofemoral Junction: No evidence of thrombus. Normal compressibility and flow on color Doppler imaging. Profunda Femoral Vein: No evidence of thrombus. Normal compressibility and flow on color Doppler  imaging. Femoral Vein: No evidence of thrombus. Normal compressibility, respiratory phasicity and response to augmentation. Popliteal Vein: No evidence of thrombus. Normal compressibility, respiratory phasicity and response to augmentation. Calf Veins: No evidence of thrombus. Normal compressibility and flow on color Doppler imaging. Superficial Great Saphenous Vein: No evidence of thrombus. Normal compressibility. Venous Reflux:  None. Other Findings:  None. IMPRESSION: 1. 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 2. No left lower extremity DVT. Electronically Signed   By: Narda Rutherford M.D.   On: 09/29/2022 19:28   DG Chest Port 1 View  Result Date: 09/29/2022 CLINICAL DATA:  Intractable nausea and vomiting. Weakness and hypotension. EXAM: PORTABLE CHEST 1 VIEW COMPARISON:  Chest radiographs 01/27/2017, CT abdomen and pelvis 01/27/2017 FINDINGS: Moderate atherosclerotic calcifications within the aortic arch.  A benign calcified granuloma again overlies the inferolateral left lung, also seen on prior CT. The lungs are clear. No pleural effusion or pneumothorax. No acute skeletal abnormality. IMPRESSION: No acute cardiopulmonary disease process. Electronically Signed   By: Neita Garnet M.D.   On: 09/29/2022 18:29    Labs on Admission: I have personally reviewed following labs  CBC: Recent Labs  Lab 09/29/22 1457  WBC 17.3*  NEUTROABS 10.1*  HGB 9.8*  HCT 29.5*  MCV 91.3  PLT 414*   Basic Metabolic Panel: Recent Labs  Lab 09/29/22 1457  NA 129*  K 5.0  CL 98  CO2 20*  GLUCOSE 170*  BUN 110*  CREATININE 1.91*  CALCIUM 9.6   GFR: Estimated Creatinine Clearance: 32.4 mL/min (A) (by C-G formula based on SCr of 1.91 mg/dL (H)).  Urine analysis:    Component Value Date/Time   COLORURINE YELLOW (A) 01/27/2017 1016   APPEARANCEUR Clear 06/13/2019 1611   LABSPEC 1.009 01/27/2017 1016   PHURINE 5.0 01/27/2017 1016   GLUCOSEU Negative  06/13/2019 1611   HGBUR NEGATIVE 01/27/2017 1016   BILIRUBINUR Negative 06/13/2019 1611   KETONESUR NEGATIVE 01/27/2017 1016   PROTEINUR Negative 06/13/2019 1611   PROTEINUR NEGATIVE 01/27/2017 1016   NITRITE Negative 06/13/2019 1611   NITRITE NEGATIVE 01/27/2017 1016   LEUKOCYTESUR Negative 06/13/2019 1611   This document was prepared using Dragon Voice Recognition software and may include unintentional dictation errors.  Dr. Sedalia Muta [facility]  If 7PM-7AM, please contact overnight-coverage provider If 7AM-7PM, please contact day attending provider [email or web address]  09/29/2022, 10:36 PM

## 2022-09-29 NOTE — Assessment & Plan Note (Signed)
Home terazosin not resumed on admission due to hypotension and concerns of upper GI bleed

## 2022-09-29 NOTE — Assessment & Plan Note (Addendum)
Status post ceftriaxone 2 g IV one-time dose and vancomycin per EDP Continue with ceftriaxone 1 g IV daily Vascular has been consulted

## 2022-09-29 NOTE — Assessment & Plan Note (Addendum)
On 09/22/2022 Prescribed Eliquis outpatient We will hold Eliquis on admission Ordered heparin GTT per pharmacy Vascular surgeon consulted via secure chat for for evaluation for IVC filter and also for left groin cellulitis Check CBC in a.m.

## 2022-09-29 NOTE — Assessment & Plan Note (Signed)
Patient endorses readiness to stop tobacco use. Greater than 5 minutes spent on tobacco cessation counseling Week one, smoke 14 cigarettes per day. Week two, smoke 13 cigarettes per day. Week three, smoke 12 cigarettes per day, continue until smoking half the amount of cigarettes per day Discuss with PCP for pharmacologic assistance with smoking cessation Clean all indoor clothing, sheets, blankets, and freshen textile furniture to rid the smell of cigarettes Only smoke outside and wear outer covering Leave cigarettes and lighters outside in separate places During in-between cigarettes, if you feel the urge to smoke, use the following: stress squeezing devices/phone a trusted friend to talk you through the urge/walk in a safe environment Avoid prolong interactions with individuals actively smoking cigarettes If you smoke in social setting, avoid social setting where cigarette smoking is expected or considered acceptable  Call 1800 QUIT NOW if in need of nicotine patches to help with cessation

## 2022-09-29 NOTE — Assessment & Plan Note (Addendum)
Baseline hemoglobin over the last 2 weeks has been 10.9-12.1 On admission her hemoglobin is 9.8 We will check anemia panel Ordered Protonix 40 mg IV twice daily, 2 days ordered Recheck CBC in a.m. Peripheral IV order for nursing to ensure and maintain 2 PIV, prefer large-bore Gastroenterology specialist has been consulted via secure chat and Epic order Adm GI it to telemetry cardiac, inpatient

## 2022-09-29 NOTE — Assessment & Plan Note (Addendum)
Home long-acting insulin 30 units nightly resumed Home glipizide and Jardiance not resumed on admission Insulin SSI with at bedtime coverage ordered

## 2022-09-29 NOTE — Assessment & Plan Note (Signed)
Home valsartan 220 mg daily, spironolactone 25 mg daily, were not resumed on admission due to mild low normotensive on admission Home Coreg 6.25 mg p.o. twice daily resumed

## 2022-09-29 NOTE — Assessment & Plan Note (Addendum)
He denies trauma He reports he has been hurting since 09/15/2022 Right ankle x-ray 2 views ordered Oxycodone-acetaminophen 5-325 mg p.o. every 6 hours as needed for severe, moderate pain, 2 days ordered

## 2022-09-30 ENCOUNTER — Encounter
Admission: EM | Disposition: A | Discharge status: Other Acute Inpatient Hospital | Payer: Self-pay | Source: Home / Self Care | Attending: Internal Medicine

## 2022-09-30 ENCOUNTER — Inpatient Hospital Stay: Payer: Medicare HMO | Admitting: Anesthesiology

## 2022-09-30 ENCOUNTER — Other Ambulatory Visit: Payer: Self-pay

## 2022-09-30 ENCOUNTER — Encounter (INDEPENDENT_AMBULATORY_CARE_PROVIDER_SITE_OTHER): Payer: Medicare HMO | Admitting: Nurse Practitioner

## 2022-09-30 DIAGNOSIS — Z7901 Long term (current) use of anticoagulants: Secondary | ICD-10-CM

## 2022-09-30 DIAGNOSIS — I97638 Postprocedural hematoma of a circulatory system organ or structure following other circulatory system procedure: Secondary | ICD-10-CM

## 2022-09-30 DIAGNOSIS — I959 Hypotension, unspecified: Secondary | ICD-10-CM

## 2022-09-30 DIAGNOSIS — T8149XA Infection following a procedure, other surgical site, initial encounter: Secondary | ICD-10-CM

## 2022-09-30 DIAGNOSIS — F17210 Nicotine dependence, cigarettes, uncomplicated: Secondary | ICD-10-CM

## 2022-09-30 DIAGNOSIS — I82451 Acute embolism and thrombosis of right peroneal vein: Secondary | ICD-10-CM

## 2022-09-30 DIAGNOSIS — K921 Melena: Secondary | ICD-10-CM

## 2022-09-30 DIAGNOSIS — K2210 Ulcer of esophagus without bleeding: Secondary | ICD-10-CM | POA: Diagnosis not present

## 2022-09-30 DIAGNOSIS — Z9889 Other specified postprocedural states: Secondary | ICD-10-CM

## 2022-09-30 DIAGNOSIS — K922 Gastrointestinal hemorrhage, unspecified: Secondary | ICD-10-CM | POA: Diagnosis not present

## 2022-09-30 HISTORY — PX: ESOPHAGOGASTRODUODENOSCOPY (EGD) WITH PROPOFOL: SHX5813

## 2022-09-30 LAB — CBC
HCT: 24.3 % — ABNORMAL LOW (ref 39.0–52.0)
Hemoglobin: 8 g/dL — ABNORMAL LOW (ref 13.0–17.0)
MCH: 30 pg (ref 26.0–34.0)
MCHC: 32.9 g/dL (ref 30.0–36.0)
MCV: 91 fL (ref 80.0–100.0)
Platelets: 338 K/uL (ref 150–400)
RBC: 2.67 MIL/uL — ABNORMAL LOW (ref 4.22–5.81)
RDW: 12.6 % (ref 11.5–15.5)
WBC: 16 K/uL — ABNORMAL HIGH (ref 4.0–10.5)
nRBC: 0 % (ref 0.0–0.2)

## 2022-09-30 LAB — APTT: aPTT: 44 s — ABNORMAL HIGH (ref 24–36)

## 2022-09-30 LAB — BASIC METABOLIC PANEL WITH GFR
Anion gap: 9 (ref 5–15)
BUN: 105 mg/dL — ABNORMAL HIGH (ref 8–23)
CO2: 19 mmol/L — ABNORMAL LOW (ref 22–32)
Calcium: 8.8 mg/dL — ABNORMAL LOW (ref 8.9–10.3)
Chloride: 104 mmol/L (ref 98–111)
Creatinine, Ser: 1.67 mg/dL — ABNORMAL HIGH (ref 0.61–1.24)
GFR, Estimated: 42 mL/min — ABNORMAL LOW
Glucose, Bld: 185 mg/dL — ABNORMAL HIGH (ref 70–99)
Potassium: 4.7 mmol/L (ref 3.5–5.1)
Sodium: 132 mmol/L — ABNORMAL LOW (ref 135–145)

## 2022-09-30 LAB — GLUCOSE, CAPILLARY
Glucose-Capillary: 170 mg/dL — ABNORMAL HIGH (ref 70–99)
Glucose-Capillary: 134 mg/dL — ABNORMAL HIGH (ref 70–99)
Glucose-Capillary: 154 mg/dL — ABNORMAL HIGH (ref 70–99)

## 2022-09-30 LAB — HEPARIN LEVEL (UNFRACTIONATED): Heparin Unfractionated: >1.1 [IU]/mL — ABNORMAL HIGH (ref 0.30–0.70)

## 2022-09-30 LAB — VITAMIN B12: Vitamin B-12: 654 pg/mL (ref 180–914)

## 2022-09-30 LAB — CBG MONITORING, ED: Glucose-Capillary: 148 mg/dL — ABNORMAL HIGH (ref 70–99)

## 2022-09-30 LAB — MAGNESIUM: Magnesium: 2.9 mg/dL — ABNORMAL HIGH (ref 1.7–2.4)

## 2022-09-30 LAB — PHOSPHORUS: Phosphorus: 5 mg/dL — ABNORMAL HIGH (ref 2.5–4.6)

## 2022-09-30 SURGERY — ESOPHAGOGASTRODUODENOSCOPY (EGD) WITH PROPOFOL
Anesthesia: General

## 2022-09-30 MED ORDER — LIDOCAINE HCL (CARDIAC) PF 100 MG/5ML IV SOSY
PREFILLED_SYRINGE | INTRAVENOUS | Status: DC | PRN
  Administered 2022-09-30: 80 mg via INTRAVENOUS

## 2022-09-30 MED ORDER — SODIUM CHLORIDE 0.9 % IV SOLN: INTRAVENOUS | Status: DC | PRN

## 2022-09-30 MED ORDER — TRAZODONE HCL 50 MG PO TABS
50.0000 mg | ORAL_TABLET | Freq: Every evening | ORAL | Status: DC | PRN
  Administered 2022-10-01 – 2022-10-06 (×5): 50 mg via ORAL
  Filled 2022-09-30 (×5): qty 1

## 2022-09-30 MED ORDER — HYDRALAZINE HCL 20 MG/ML IJ SOLN: 10.0000 mg | INTRAMUSCULAR | Status: DC | PRN

## 2022-09-30 MED ORDER — SENNOSIDES-DOCUSATE SODIUM 8.6-50 MG PO TABS
1.0000 | ORAL_TABLET | Freq: Every evening | ORAL | Status: DC | PRN
  Filled 2022-09-30: qty 1

## 2022-09-30 MED ORDER — PROPOFOL 10 MG/ML IV BOLUS
INTRAVENOUS | Status: DC | PRN
  Administered 2022-09-30: 40 mg via INTRAVENOUS
  Administered 2022-09-30: 10 mg via INTRAVENOUS
  Administered 2022-09-30: 20 mg via INTRAVENOUS

## 2022-09-30 MED ORDER — GUAIFENESIN 100 MG/5ML PO LIQD: 5.0000 mL | ORAL | Status: DC | PRN

## 2022-09-30 MED ORDER — METOPROLOL TARTRATE 5 MG/5ML IV SOLN: 5.0000 mg | INTRAVENOUS | Status: DC | PRN

## 2022-09-30 MED ORDER — IPRATROPIUM-ALBUTEROL 0.5-2.5 (3) MG/3ML IN SOLN: 3.0000 mL | RESPIRATORY_TRACT | Status: DC | PRN

## 2022-09-30 MED ORDER — CEFAZOLIN SODIUM-DEXTROSE 2-4 GM/100ML-% IV SOLN
2.0000 g | Freq: Three times a day (TID) | INTRAVENOUS | Status: AC
  Administered 2022-09-30 – 2022-10-05 (×16): 2 g via INTRAVENOUS
  Filled 2022-09-30 (×16): qty 100

## 2022-09-30 NOTE — Anesthesia Preprocedure Evaluation (Signed)
Anesthesia Evaluation  Patient identified by MRN, date of birth, ID band Patient awake    Reviewed: Allergy & Precautions, NPO status , Patient's Chart, lab work & pertinent test results  Airway Mallampati: III  TM Distance: >3 FB Neck ROM: full    Dental  (+) Chipped, Upper Dentures   Pulmonary shortness of breath and with exertion, COPD, Current Smoker and Patient abstained from smoking.   Pulmonary exam normal        Cardiovascular hypertension, (-) angina + Peripheral Vascular Disease  Normal cardiovascular exam     Neuro/Psych negative neurological ROS  negative psych ROS   GI/Hepatic Neg liver ROS,GERD  Controlled,,  Endo/Other  diabetes, Type 2    Renal/GU Renal disease  negative genitourinary   Musculoskeletal   Abdominal   Peds  Hematology negative hematology ROS (+)   Anesthesia Other Findings Patient is NPO appropriate and reports no nausea or vomiting today.   Past Medical History: No date: Diabetes mellitus without complication (HCC)     Comment:  borderline No date: Hepatomegaly No date: Hyperlipidemia No date: Hypertension No date: Substance abuse (HCC)  Past Surgical History: No date: ANGIOPLASTY No date: APPENDECTOMY 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  patient unsure of this but thinks this is what               happened No date: CARDIAC SURGERY 11/13/2015: CATARACT EXTRACTION W/PHACO; Left     Comment:  Procedure: CATARACT EXTRACTION PHACO AND INTRAOCULAR               LENS PLACEMENT (IOC);  Surgeon: Galen Manila, MD;                Location: [facility] OR[facility];  Service: Ophthalmology;  Laterality:              Left;  [place] 1.06 AP% 23.9 CDE 15.81 Fluid pack lot #               [id number] 11/27/2015: CATARACT EXTRACTION W/PHACO; Right     Comment:  Procedure: CATARACT EXTRACTION PHACO AND INTRAOCULAR               LENS PLACEMENT (IOC);  Surgeon: Galen Manila, MD;                 Location: [facility];  Service: Ophthalmology;  Laterality:              Right;  [place] 1.06 AP% 23.8 CDE 15.80 Fluid pack lot #               [id number] 2012: COLONOSCOPY     Comment:  polyps, diverticulosis , 32yr repeat 07/17/2016: COLONOSCOPY WITH PROPOFOL; N/A     Comment:  Procedure: COLONOSCOPY WITH PROPOFOL;  Surgeon: Wyline Mood, MD;  Location: [facility];  Service: Endoscopy;              Laterality: N/A; 09/10/2022: ENDARTERECTOMY FEMORAL; Bilateral     Comment:  Procedure: ENDARTERECTOMY FEMORAL;  Surgeon: Annice Needy, MD;  Location: [facility];  Service: Vascular;                Laterality: Bilateral; 1982: FRACTURE SURGERY; Left     Comment:  foot, leg.unsure if metal in these parts.possibly  replaced ankle 09/10/2022: INSERTION OF ILIAC STENT; Bilateral     Comment:  Procedure: INSERTION OF ILIAC STENT;  Surgeon: Annice Needy, MD;  Location: [facility];  Service: Vascular;                Laterality: Bilateral; 09/04/2022: LOWER EXTREMITY ANGIOGRAPHY; Right     Comment:  Procedure: Lower Extremity Angiography;  Surgeon: Annice Needy, MD;  Location: [facility];  Service:               Cardiovascular;  Laterality: Right; 12/27/2015: PERIPHERAL VASCULAR CATHETERIZATION; Left     Comment:  Procedure: Lower Extremity Angiography;  Surgeon: Annice Needy, MD;  Location: [facility];  Service:               Cardiovascular;  Laterality: Left;  BMI    Body Mass Index: 26.43 kg/m      Reproductive/Obstetrics negative OB ROS                              Anesthesia Physical Anesthesia Plan  ASA: 3  Anesthesia Plan: General   Post-op Pain Management:    Induction: Intravenous  PONV Risk Score and Plan: Propofol infusion and TIVA  Airway Management Planned: Natural Airway and Nasal Cannula  Additional Equipment:   Intra-op Plan:    Post-operative Plan:   Informed Consent: I have reviewed the patients History and Physical, chart, labs and discussed the procedure including the risks, benefits and alternatives for the proposed anesthesia with the patient or authorized representative who has indicated his/her understanding and acceptance.     Dental Advisory Given  Plan Discussed with: Anesthesiologist, CRNA and Surgeon  Anesthesia Plan Comments: (Patient consented for risks of anesthesia including but not limited to:  - adverse reactions to medications - risk of airway placement if required - damage to eyes, teeth, lips or other oral mucosa - nerve damage due to positioning  - sore throat or hoarseness - Damage to heart, brain, nerves, lungs, other parts of body or loss of life  Patient voiced understanding.)         Anesthesia Quick Evaluation

## 2022-09-30 NOTE — Progress Notes (Signed)
PROGRESS NOTE    William Ball  ZOX:[id number] DOB: 21-Sep-1945 DOA: 09/29/2022 PCP: Luciana Axe, NP   Brief Narrative:  77 year old with history of HTN, HLD, tobacco use, non-insulin-dependent DM2, constipation, PAD status post bilateral femoral endarterectomy with bilateral iliac stent placement on 09/05/2022, occlusive right lower extremity DVT comes to the ED with weakness and hypotension.  Patient was noted to have hemoglobin of 9.8, started on PPI, given antibiotics in the ED.  Patient was diagnosed with acute on chronic anemia.  GI team consulted.   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  Continue PPI IV.  Especially because patient is on anticoagulation, GI consulted who plans on doing endoscopy today. Transfuse if Hb <8  Right ankle pain X-ray shows soft tissue swelling but no fracture.  Will advise RICE.  PT/OT, follow-up outpatient.  Insulin dependent type 2 diabetes mellitus (HCC) Peripheral neuropathy Outpatient glipizide and Jardiance on hold.  Continue Semglee 30 units daily.  Sliding scale and Accu-Cheks Gabapentin  Essential hypertension Continue Coreg.  Other home medications are on hold. 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 Vascular team consulted by the admitting provider especially because patient had a recent intervention  Sciatica Nerve Pain RLE Was started on Outpatient Prednisone taper by PCP, last day tomorrow.   Cellulitis of left groin Vascular team consulted.  Currently on IV vancomycin and Rocephin, can descalate to Ancef.   Leukocytosis On prednisone taper, end date 10/01/2022 CBC in the AM  AKI (acute kidney injury) (HCC) Baseline creatinine 1.5, admission creatinine 1.9, improving.  Overweight (BMI 25.0-29.9) This complicates overall care and prognosis.   Hyponatremia Improved  BPH (benign prostatic hyperplasia) Home terazosin not resumed on admission due to hypotension and concerns of upper GI bleed  Hyperlipidemia On Lipitor   DVT prophylaxis: Place TED hose Start: 09/29/22 1736 Code Status: Full Family Communication:   Status is: Inpatient On going eval for GI bleed.        Diet Orders (From admission, onward)     Start     Ordered   09/30/22 0001  Diet NPO time specified Except for: Sips with Meds  Diet effective midnight       Question:  Except for  Answer:  Clearance Coots with Meds   09/29/22 1825            Subjective: Seen at bedside, no complaints at this time.    Examination:  General exam: Appears calm and comfortable  Respiratory system: Clear to auscultation. Respiratory effort normal. Cardiovascular system: S1 & S2 heard, RRR. No JVD, murmurs, rubs, gallops or clicks. No pedal edema. Gastrointestinal system: Abdomen  is nondistended, soft and nontender. No organomegaly or masses felt. Normal bowel sounds heard. Central nervous system: Alert and oriented. No focal neurological deficits. Extremities: Symmetric 5 x 5 power. Skin: No rashes, lesions or ulcers Psychiatry: Judgement and insight appear normal. Mood  & affect appropriate.  Objective: Vitals:   09/30/22 0745 09/30/22 0747 09/30/22 0800 09/30/22 0830  BP:   (!) 94/35 (!) 99/42  Pulse:   (!) 55 (!) 57  Resp: 20   (!) 24  Temp:  98.2 F (36.8 C)    TempSrc:  Oral    SpO2:   95% 96%  Weight:      Height:        Intake/Output Summary (Last 24 hours) at 09/30/2022 0853 Last data filed at 09/30/2022 4098 Gross per 24 hour  Intake 1063.77 ml  Output --  Net 1063.77 ml   Filed Weights   09/29/22 1454  Weight: 81.2 kg    Scheduled Meds:  atorvastatin  40 mg Oral QHS   carvedilol  3.125 mg Oral BID   folic acid  1 mg Oral Daily   gabapentin  100 mg Oral TID   insulin aspart  0-5 Units Subcutaneous QHS   insulin aspart  0-9 Units Subcutaneous TID WC   insulin glargine-yfgn  30 Units Subcutaneous Daily   multivitamin with minerals  1 tablet Oral Daily   pantoprazole (PROTONIX) IV  40 mg Intravenous BID   predniSONE  5 mg Oral Q breakfast   senna-docusate  2 tablet Oral BID   thiamine  100 mg Oral Daily   vancomycin variable dose per unstable renal function (pharmacist dosing)   Does not apply See admin instructions   Continuous Infusions:  cefTRIAXone (ROCEPHIN)  IV      Nutritional status     Body mass index is 26.43 kg/m.  Data Reviewed:   CBC: Recent Labs  Lab 09/29/22 1457 09/30/22 0450  WBC 17.3* 16.0*  NEUTROABS 10.1*  --   HGB 9.8* 8.0*  HCT 29.5* 24.3*  MCV 91.3 91.0  PLT 414* 338   Basic Metabolic Panel: Recent Labs  Lab 09/29/22 1457 09/30/22 0450  NA 129* 132*  K 5.0 4.7  CL 98 104  CO2 20* 19*  GLUCOSE 170* 185*  BUN 110* 105*  CREATININE 1.91* 1.67*  CALCIUM 9.6 8.8*   GFR: Estimated Creatinine Clearance: 37 mL/min (A) (by C-G formula based on SCr of 1.67 mg/dL (H)). Liver Function Tests: Recent Labs  Lab 09/29/22 1827  AST 46*  ALT 97*  ALKPHOS 91  BILITOT 0.7  PROT 7.1  ALBUMIN 3.8   No results for input(s): "LIPASE", "AMYLASE" in the last 168 hours. No results for  input(s): "AMMONIA" in the last 168 hours. Coagulation Profile: Recent Labs  Lab 09/29/22 1827  INR 1.3*   Cardiac Enzymes: No results for input(s): "CKTOTAL", "CKMB", "CKMBINDEX", "TROPONINI" in the last 168 hours. BNP (last 3 results) No results for input(s): "PROBNP" in the last 8760 hours. HbA1C: No results for input(s): "HGBA1C" in the last 72 hours. CBG: Recent Labs  Lab 09/29/22 2245 09/30/22 0849  GLUCAP 160* 148*   Lipid Profile: No results for input(s): "CHOL", "HDL", "LDLCALC", "TRIG", "CHOLHDL", "LDLDIRECT" in the last 72 hours. Thyroid Function Tests: No results for input(s): "TSH", "T4TOTAL", "FREET4", "T3FREE", "THYROIDAB" in the last 72 hours. Anemia Panel: Recent Labs    09/29/22 1737 09/29/22 1827  VITAMINB12 654  --   FOLATE  --  26.0  FERRITIN  --  188  TIBC  --  400  IRON  --  77  RETICCTPCT  --  3.0   Sepsis Labs: Recent Labs  Lab 09/29/22 1827  LATICACIDVEN 1.2    Recent Results (from the past 240 hour(s))  Blood culture (routine x 2)     Status: None (Preliminary result)   Collection Time: 09/29/22  6:32 PM   Specimen: BLOOD  Result Value Ref Range Status   Specimen Description BLOOD LEFT ANTECUBITAL  Final   Special Requests   Final    BOTTLES DRAWN AEROBIC AND ANAEROBIC Blood Culture adequate volume   Culture   Final    NO GROWTH < 12 HOURS Performed at [facility], [address], [place], [place] [address]    Report Status PENDING  Incomplete  Blood culture (routine x 2)     Status: None (Preliminary result)   Collection Time: 09/29/22  6:32 PM   Specimen: BLOOD  Result Value Ref Range Status   Specimen Description BLOOD BLOOD LEFT FOREARM  Final   Special Requests   Final    BOTTLES DRAWN AEROBIC AND ANAEROBIC Blood Culture results may not be optimal due to an excessive volume of blood received in culture bottles   Culture   Final    NO GROWTH < 12 HOURS Performed at [facility], [address]  [place], [place] [address]    Report Status PENDING  Incomplete         Radiology Studies: DG Ankle 2 Views Right  Result Date: 09/29/2022 CLINICAL DATA:  [id number] with ankle pain and purulent cellulitis. EXAM: RIGHT ANKLE - 2 VIEW COMPARISON:  None Available. FINDINGS: There is no evidence of fracture, dislocation, or joint effusion. There is no evidence of arthropathy or acute focal bone abnormality. Moderate posterior and plantar calcaneal enthesopathic spurring is noted. Mild anterior soft tissue fullness is noted with otherwise unremarkable soft tissues. IMPRESSION: 1. Mild anterior soft tissue fullness without underlying osseous abnormality. 2. Moderate posterior and plantar calcaneal enthesopathic spurring. Electronically Signed   By: Almira Bar M.D.   On: 09/29/2022 22:13   [place] Lower Ext Art Left Ltd  Result Date: 09/29/2022 CLINICAL DATA:  Recent endovascular stent placement. Redness and drainage at incision site in left groin. EXAM: LEFT LOWER EXTREMITY ARTERIAL DUPLEX SCAN TECHNIQUE: Gray-scale sonography as well as color Doppler and duplex ultrasound was performed to evaluate the left common femoral artery, superficial femoral artery and profunda femoral artery in the area of concern COMPARISON:  None Available. FINDINGS: Left lower Extremity No pseudoaneurysm seen. No visible AV fistula. Hypoechoic material is seen in the left groin adjacent to the vessels and in the left groin, likely small hematoma. Area in the left groin measures approximately 1 cm in maximum diameter. IMPRESSION: No evidence of pseudoaneurysm or AV fistula. Probable small left groin hematoma. Electronically Signed   By: Charlett Nose M.D.   On: 09/29/2022 21:06   US Venous Img Lower Bilateral  Result Date: 09/29/2022 CLINICAL DATA:  Leg swelling.  Follow-up DVT. EXAM: BILATERAL LOWER EXTREMITY VENOUS DOPPLER ULTRASOUND TECHNIQUE: Gray-scale sonography with graded compression, as well as color Doppler and duplex  ultrasound were performed to evaluate the lower extremity deep venous systems from the level of the common femoral vein and including the common femoral, femoral, profunda femoral, popliteal and calf veins including the posterior tibial, peroneal and gastrocnemius veins when visible. The superficial great saphenous vein was also interrogated. Spectral Doppler was utilized to evaluate flow at rest  and with distal augmentation maneuvers in the common femoral, femoral and popliteal veins. COMPARISON:  Right lower extremity duplex 09/22/2022. FINDINGS: RIGHT LOWER EXTREMITY Common Femoral Vein: No evidence of thrombus. Normal compressibility, respiratory phasicity and response to augmentation. Saphenofemoral Junction: No evidence of thrombus. Normal compressibility and flow on color Doppler imaging. Profunda Femoral Vein: No evidence of thrombus. Normal compressibility and flow on color Doppler imaging. Femoral Vein: No evidence of thrombus. Normal compressibility, respiratory phasicity and response to augmentation. Popliteal Vein: No evidence of thrombus. Normal compressibility, respiratory phasicity and response to augmentation. Calf Veins: Again seen occlusion of the peroneal vein. Posterior tibial vein is patent. Superficial Great Saphenous Vein: No evidence of thrombus. Normal compressibility. Venous Reflux:  None. Other Findings:  None. LEFT LOWER EXTREMITY Common Femoral Vein: No evidence of thrombus. Normal compressibility, respiratory phasicity and response to augmentation. Saphenofemoral Junction: No evidence of thrombus. Normal compressibility and flow on color Doppler imaging. Profunda Femoral Vein: No evidence of thrombus. Normal compressibility and flow on color Doppler imaging. Femoral Vein: No evidence of thrombus. Normal compressibility, respiratory phasicity and response to augmentation. Popliteal Vein: No evidence of thrombus. Normal compressibility, respiratory phasicity and response to augmentation.  Calf Veins: No evidence of thrombus. Normal compressibility and flow on color Doppler imaging. Superficial Great Saphenous Vein: No evidence of thrombus. Normal compressibility. Venous Reflux:  None. Other Findings:  None. IMPRESSION: 1. 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 2. No left lower extremity DVT. Electronically Signed   By: Narda Rutherford M.D.   On: 09/29/2022 19:28   DG Chest Port 1 View  Result Date: 09/29/2022 CLINICAL DATA:  Intractable nausea and vomiting. Weakness and hypotension. EXAM: PORTABLE CHEST 1 VIEW COMPARISON:  Chest radiographs 01/27/2017, CT abdomen and pelvis 01/27/2017 FINDINGS: Moderate atherosclerotic calcifications within the aortic arch. A benign calcified granuloma again overlies the inferolateral left lung, also seen on prior CT. The lungs are clear. No pleural effusion or pneumothorax. No acute skeletal abnormality. IMPRESSION: No acute cardiopulmonary disease process. Electronically Signed   By: Neita Garnet M.D.   On: 09/29/2022 18:29           LOS: 1 day   Time spent= 35 mins    Madhav Mohon Joline Maxcy, MD [facility]  If 7PM-7AM, please contact night-coverage  09/30/2022, 8:53 AM

## 2022-09-30 NOTE — Consult Note (Signed)
Hospital Consult    Reason for Consult:  Right Lower Extremity DVT  Requesting Physician:  Dr. Londell Moh DO MRN #:  [id number]  History of Present Illness: This is a 77 y.o. male with history of hypertension, hyperlipidemia, tobacco abuse, diabetes mellitus 2 non-insulin-dependent, constipation, PAD.  Patient is now status post bilateral femoral endarterectomies with bilateral iliac stents placed on 09/10/2026.  He was discharged on 09/15/2022 to home doing well.  He presents to [facility] emergency department on 09/29/2022 with a chief complaint of hypotension and weakness.  Patient states he went to see his primary care physician about a week ago and he was placed on Eliquis for his lower extremity DVT.  He was also discharged from the hospital on aspirin 81 mg daily and Plavix 75 mg daily.  Over the past week patient has noticed black tarry stools which she states started after he started taking the Eliquis.  Patient endorses today that he has been seen by gastroenterology and he will be taking for an upper endoscope sometime this morning.  Patient also endorses drinking alcohol after returning home.  Past Medical History:  Diagnosis Date   Diabetes mellitus without complication (HCC)    borderline   Hepatomegaly    Hyperlipidemia    Hypertension    Substance abuse (HCC)     Past Surgical History:  Procedure Laterality Date   ANGIOPLASTY     APPENDECTOMY     BONE MARROW TRANSPLANT Left    ankle.  patient unsure of this but thinks this is what happened   CARDIAC SURGERY     CATARACT EXTRACTION W/PHACO Left 11/13/2015   Procedure: CATARACT EXTRACTION PHACO AND INTRAOCULAR LENS PLACEMENT (IOC);  Surgeon: Galen Manila, MD;  Location: [facility] ORS;  Service: Ophthalmology;  Laterality: Left;  [place] 1.06 AP% 23.9 CDE 15.81 Fluid pack lot # [id number]   CATARACT EXTRACTION W/PHACO Right 11/27/2015   Procedure: CATARACT EXTRACTION PHACO AND INTRAOCULAR LENS PLACEMENT (IOC);  Surgeon: Galen Manila,  MD;  Location: [facility] ORS;  Service: Ophthalmology;  Laterality: Right;  [place] 1.06 AP% 23.8 CDE 15.80 Fluid pack lot # [id number]   COLONOSCOPY  2012   polyps, diverticulosis , 71yr repeat   COLONOSCOPY WITH PROPOFOL N/A 07/17/2016   Procedure: COLONOSCOPY WITH PROPOFOL;  Surgeon: Wyline Mood, MD;  Location: [facility] ENDOSCOPY;  Service: Endoscopy;  Laterality: N/A;   ENDARTERECTOMY FEMORAL Bilateral 09/10/2022   Procedure: ENDARTERECTOMY FEMORAL;  Surgeon: Annice Needy, MD;  Location: [facility] ORS;  Service: Vascular;  Laterality: Bilateral;   FRACTURE SURGERY Left 1982   foot, leg.unsure if metal in these parts.possibly replaced ankle   INSERTION OF ILIAC STENT Bilateral 09/10/2022   Procedure: INSERTION OF ILIAC STENT;  Surgeon: Annice Needy, MD;  Location: [facility] ORS;  Service: Vascular;  Laterality: Bilateral;   LOWER EXTREMITY ANGIOGRAPHY Right 09/04/2022   Procedure: Lower Extremity Angiography;  Surgeon: Annice Needy, MD;  Location: [facility] INVASIVE CV LAB;  Service: Cardiovascular;  Laterality: Right;   PERIPHERAL VASCULAR CATHETERIZATION Left 12/27/2015   Procedure: Lower Extremity Angiography;  Surgeon: Annice Needy, MD;  Location: [facility] INVASIVE CV LAB;  Service: Cardiovascular;  Laterality: Left;    Allergies  Allergen Reactions   Amlodipine Itching    Prior to Admission medications   Medication Sig Start Date End Date Taking? Authorizing Provider  albuterol (VENTOLIN HFA) 108 (90 Base) MCG/ACT inhaler Inhale 1-2 puffs into the lungs every 6 (six) hours as needed for wheezing or shortness of breath.   Yes [provider]  apixaban (ELIQUIS) 5 MG TABS tablet Take 5-10 mg by mouth See admin instructions. Take 2 tablets (10mg ) by mouth twice daily for 7 days then take 1 tablet (5mg ) by mouth twice daily thereafter   Yes [provider]  aspirin EC 81 MG tablet Take 81 mg by mouth daily.   Yes [provider]  atorvastatin (LIPITOR) 40 MG tablet Take 40 mg by mouth daily.   Yes  [provider]  carvedilol (COREG) 6.25 MG tablet Take 3.125 mg by mouth 2 (two) times daily.   Yes [provider]  chlorthalidone (HYGROTON) 50 MG tablet Take 1 tablet (50 mg total) by mouth daily. MUST BE SEEN IN CLINIC FOR FURTHER REFILLS ON MEDICATION 05/27/21  Yes Debbe Odea, MD  clopidogrel (PLAVIX) 75 MG tablet Take 1 tablet (75 mg total) by mouth daily. 09/16/22 12/15/22 Yes Darlin Priestly, MD  empagliflozin (JARDIANCE) 10 MG TABS tablet Take 10 mg by mouth daily.   Yes [provider]  folic acid (FOLVITE) 1 MG tablet Take 1 tablet (1 mg total) by mouth daily. 09/16/22  Yes Darlin Priestly, MD  gabapentin (NEURONTIN) 100 MG capsule Take 100 mg by mouth 3 (three) times daily.   Yes [provider]  glipiZIDE (GLUCOTROL XL) 10 MG 24 hr tablet Take 20 mg by mouth daily.   Yes [provider]  insulin degludec (TRESIBA) 100 UNIT/ML FlexTouch Pen Inject 30 Units into the skin at bedtime.   Yes [provider]  Multiple Vitamin (MULTIVITAMIN WITH MINERALS) TABS tablet Take 1 tablet by mouth daily. 09/16/22  Yes Darlin Priestly, MD  nicotine (NICODERM CQ - DOSED IN MG/24 HOURS) 21 mg/24hr patch Place 1 patch (21 mg total) onto the skin daily as needed (nicotine craving). 09/15/22  Yes Darlin Priestly, MD  Omega-3 Fatty Acids (FISH OIL) 1000 MG CAPS Take 1,000 mg by mouth daily.   Yes [provider]  oxyCODONE-acetaminophen (PERCOCET/ROXICET) 5-325 MG tablet Take 1 tablet by mouth every 4 (four) hours as needed for severe pain.   Yes [provider]  polyethylene glycol (MIRALAX / GLYCOLAX) 17 g packet Take 17 g by mouth 2 (two) times daily. 09/15/22  Yes Darlin Priestly, MD  senna-docusate (SENOKOT-S) 8.6-50 MG tablet Take 2 tablets by mouth 2 (two) times daily. 09/15/22  Yes Darlin Priestly, MD  spironolactone (ALDACTONE) 25 MG tablet Take 25 mg by mouth daily.   Yes [provider]  terazosin (HYTRIN) 2 MG capsule Take 2 mg by mouth at bedtime.    Yes [provider]  thiamine (VITAMIN B-1) 100 MG tablet Take 1 tablet (100 mg total) by mouth daily. 09/16/22  Yes Darlin Priestly, MD  valsartan (DIOVAN) 320 MG tablet Take 160 mg by mouth daily.   Yes [provider]    Social History   Socioeconomic History   Marital status: Married    Spouse name: Not on file   Number of children: Not on file   Years of education: Not on file   Highest education level: 9th grade  Occupational History   Occupation: working full time   Tobacco Use   Smoking status: Every Day    Packs/day: 1.00    Years: 55.00    Additional pack years: 0.00    Total pack years: 55.00    Types: Cigarettes   Smokeless tobacco: Never  Vaping Use   Vaping Use: Never used  Substance and Sexual Activity   Alcohol use: Yes  Alcohol/week: 28.0 standard drinks of alcohol    Types: 28 Cans of beer per week    Comment: 4-6 daily beer   Drug use: No   Sexual activity: Not on file  Other Topics Concern   Not on file  Social History Narrative   Retired   International aid/development worker of Corporate investment banker Strain: Low Risk  (08/06/2017)   Overall Financial Resource Strain (CARDIA)    Difficulty of Paying Living Expenses: Not hard at all  Food Insecurity: No Food Insecurity (09/30/2022)   Hunger Vital Sign    Worried About Running Out of Food in the Last Year: Never true    Ran Out of Food in the Last Year: Never true  Transportation Needs: No Transportation Needs (09/30/2022)   PRAPARE - Administrator, Civil Service (Medical): No    Lack of Transportation (Non-Medical): No  Physical Activity: Inactive (08/06/2017)   Exercise Vital Sign    Days of Exercise per Week: 0 days    Minutes of Exercise per Session: 0 min  Stress: No Stress Concern Present (08/06/2017)   [employer] of Occupational Health - Occupational Stress Questionnaire    Feeling of Stress : Not at all  Social Connections: Moderately Isolated (08/06/2017)   Social  Connection and Isolation Panel [NHANES]    Frequency of Communication with Friends and Family: Never    Frequency of Social Gatherings with Friends and Family: Once a week    Attends Religious Services: Never    Database administrator or Organizations: No    Attends Banker Meetings: Never    Marital Status: Married  Catering manager Violence: Not At Risk (09/30/2022)   Humiliation, Afraid, Rape, and Kick questionnaire    Fear of Current or Ex-Partner: No    Emotionally Abused: No    Physically Abused: No    Sexually Abused: No     Family History  Problem Relation Age of Onset   Cancer Mother    Heart disease Mother    Hyperlipidemia Mother    Heart disease Father    Hyperlipidemia Father     ROS: Otherwise negative unless mentioned in HPI  Physical Examination  Vitals:   09/30/22 0915 09/30/22 1000  BP:  (!) 123/47  Pulse:  (!) 56  Resp: 14 14  Temp:    SpO2:  98%   Body mass index is 26.43 kg/m.  General:  WDWN in NAD Gait: Not observed HENT: WNL, normocephalic Pulmonary: normal non-labored breathing, without Rales, rhonchi,  wheezing Cardiac: regular, without  Murmurs, rubs or gallops; without carotid bruits Abdomen: Positive bowel sounds, soft, NT/ND, no masses Skin: without rashes Vascular Exam/Pulses: Positive palpable bilateral lower extremity Pulses. Warm to touch bilat.  Extremities: without ischemic changes, without Gangrene , with cellulitis; without open wounds;  Musculoskeletal: no muscle wasting or atrophy  Neurologic: A&O X 3;  No focal weakness or paresthesias are detected; speech is fluent/normal Psychiatric:  The pt has Normal affect. Lymph:  Unremarkable  CBC    Component Value Date/Time   WBC 16.0 (H) 09/30/2022 0450   RBC 2.67 (L) 09/30/2022 0450   HGB 8.0 (L) 09/30/2022 0450   HGB 14.0 06/13/2019 1612   HCT 24.3 (L) 09/30/2022 0450   HCT 41.5 06/13/2019 1612   PLT 338 09/30/2022 0450   PLT 258 06/13/2019 1612   MCV  91.0 09/30/2022 0450   MCV 92 06/13/2019 1612   MCH 30.0 09/30/2022 0450   MCHC 32.9  09/30/2022 0450   RDW 12.6 09/30/2022 0450   RDW 12.8 06/13/2019 1612   LYMPHSABS 4.3 (H) 09/29/2022 1457   LYMPHSABS 2.1 06/13/2019 1612   MONOABS 1.0 09/29/2022 1457   EOSABS 1.5 (H) 09/29/2022 1457   EOSABS 0.6 (H) 06/13/2019 1612   BASOSABS 0.1 09/29/2022 1457   BASOSABS 0.1 06/13/2019 1612    BMET    Component Value Date/Time   NA 132 (L) 09/30/2022 0450   NA 134 06/18/2020 1556   K 4.7 09/30/2022 0450   CL 104 09/30/2022 0450   CO2 19 (L) 09/30/2022 0450   GLUCOSE 185 (H) 09/30/2022 0450   BUN 105 (H) 09/30/2022 0450   BUN 21 06/18/2020 1556   CREATININE 1.67 (H) 09/30/2022 0450   CALCIUM 8.8 (L) 09/30/2022 0450   GFRNONAA 42 (L) 09/30/2022 0450   GFRAA 66 06/18/2020 1556    COAGS: Lab Results  Component Value Date   INR 1.3 (H) 09/29/2022   INR 1.0 09/03/2022     Non-Invasive Vascular Imaging:   EXAM:09/29/2022 LEFT LOWER EXTREMITY ARTERIAL DUPLEX SCAN  FINDINGS: Left lower Extremity   No pseudoaneurysm seen. No visible AV fistula. Hypoechoic material is seen in the left groin adjacent to the vessels and in the left groin, likely small hematoma. Area in the left groin measures approximately 1 cm in maximum diameter.   IMPRESSION: No evidence of pseudoaneurysm or AV fistula.   Probable small left groin hematoma.   EXAM:09/29/2022 BILATERAL LOWER EXTREMITY VENOUS DOPPLER ULTRASOUND  COMPARISON:  Right lower extremity duplex 09/22/2022.   FINDINGS: RIGHT LOWER EXTREMITY   Common Femoral Vein: No evidence of thrombus. Normal compressibility, respiratory phasicity and response to augmentation.   Saphenofemoral Junction: No evidence of thrombus. Normal compressibility and flow on color Doppler imaging.   Profunda Femoral Vein: No evidence of thrombus. Normal compressibility and flow on color Doppler imaging.   Femoral Vein: No evidence of thrombus. Normal  compressibility, respiratory phasicity and response to augmentation.   Popliteal Vein: No evidence of thrombus. Normal compressibility, respiratory phasicity and response to augmentation.   Calf Veins: Again seen occlusion of the peroneal vein. Posterior tibial vein is patent.   Superficial Great Saphenous Vein: No evidence of thrombus. Normal compressibility.   Venous Reflux:  None.   Other Findings:  None.   LEFT LOWER EXTREMITY   Common Femoral Vein: No evidence of thrombus. Normal compressibility, respiratory phasicity and response to augmentation.   Saphenofemoral Junction: No evidence of thrombus. Normal compressibility and flow on color Doppler imaging.   Profunda Femoral Vein: No evidence of thrombus. Normal compressibility and flow on color Doppler imaging.   Femoral Vein: No evidence of thrombus. Normal compressibility, respiratory phasicity and response to augmentation.   Popliteal Vein: No evidence of thrombus. Normal compressibility, respiratory phasicity and response to augmentation.   Calf Veins: No evidence of thrombus. Normal compressibility and flow on color Doppler imaging.   Superficial Great Saphenous Vein: No evidence of thrombus. Normal compressibility.   Venous Reflux:  None.   Other Findings:  None.   IMPRESSION: 1. 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 2. No left lower extremity DVT.  Statin:  Yes.   Beta Blocker:  Yes.   Aspirin:  Yes.   ACEI:  No. ARB:  Yes.   CCB use:  No Other antiplatelets/anticoagulants:  Yes.   Eliquis 5 mg BID   ASSESSMENT/PLAN: This is a 77 y.o. male who presents to [facility] emergency department with  hypotension weakness and black tarry stools after and placed on Eliquis by his PCP for right lower extremity DVT.  Patient endorses drinking alcohol once he had returned home.  Patient was placed on ASA 81 mg daily and Plavix 75 mg daily on discharge from his prior  surgery.  DVT was known at this time and treatment was adequate for his DVT.  Per the patient he was seen by GI and will undergo an upper endoscopy today for possible GI bleed.  PLAN: Per vascular surgery if the patient is able to tolerate ASA 81 mg daily and Plavix 75 mg daily he should return to  these medications.  There is no need for the Eliquis and treatment of his lower extremity DVT at this time.  Decision regarding anticoagulation therapy will be made after results from the upper endoscopy.  Continue IV antibiotic therapy for current cellulitis of the left groin.  Upon ultrasound there was a very small hematoma noted but no other therapy or procedure is needed at this time.   -Discussed the plan in detail with Dr. Festus Barren MD and he is in agreement with the plan.   Marcie Bal [facility] 09/30/2022 10:28 AM

## 2022-09-30 NOTE — Transfer of Care (Signed)
Immediate Anesthesia Transfer of Care Note  Patient: William Ball  Procedure(s) Performed: ESOPHAGOGASTRODUODENOSCOPY (EGD) WITH PROPOFOL  Patient Location: PACU and Endoscopy Unit  Anesthesia Type:General  Level of Consciousness: awake, drowsy, and patient cooperative  Airway & Oxygen Therapy: Patient Spontanous Breathing  Post-op Assessment: Report given to RN and Post -op Vital signs reviewed and stable  Post vital signs: Reviewed and stable  Last Vitals:  Vitals Value Taken Time  BP 103/38 09/30/22 1203  Temp 35.7 C 09/30/22 1203  Pulse 63 09/30/22 1208  Resp 24 09/30/22 1208  SpO2 97 % 09/30/22 1208  Vitals shown include unvalidated device data.  Last Pain:  Vitals:   09/30/22 1203  TempSrc: Temporal  PainSc: Asleep         Complications: No notable events documented.

## 2022-09-30 NOTE — ED Notes (Signed)
Patients Heparin infusion stopped per Physician and Pharmacy [facility] due to level of 8

## 2022-09-30 NOTE — Op Note (Signed)
[facility] Gastroenterology Patient Name: William Ball Procedure Date: 09/30/2022 11:53 AM MRN: [id number] Account #: [id number] Date of Birth: 1945-07-15 Admit Type: Inpatient Age: 77 Room: [facility] ENDO ROOM 4 Gender: Male Note Status: Finalized Instrument Name: Upper Endoscope [id number] Procedure:             Upper GI endoscopy Indications:           Melena Providers:             [name] MD, MD Referring MD:          No Local Md, MD (Referring MD) Medicines:             Propofol per Anesthesia Complications:         No immediate complications. Procedure:             Pre-Anesthesia Assessment:                        - Prior to the procedure, a History and Physical was                         performed, and patient medications and allergies were                         reviewed. The patient's tolerance of previous                         anesthesia was also reviewed. The risks and benefits                         of the procedure and the sedation options and risks                         were discussed with the patient. All questions were                         answered, and informed consent was obtained. Prior                         Anticoagulants: The patient has taken Eliquis                         (apixaban), last dose was 1 day prior to procedure.                         ASA Grade Assessment: III - A patient with severe                         systemic disease. After reviewing the risks and                         benefits, the patient was deemed in satisfactory                         condition to undergo the procedure.                        - Prior to the procedure, a History and Physical was  performed, and patient medications and allergies were                         reviewed. The patient's tolerance of previous                         anesthesia was also reviewed. The risks and benefits                         of the procedure  and the sedation options and risks                         were discussed with the patient. All questions were                         answered, and informed consent was obtained. Prior                         Anticoagulants: The patient has taken Plavix                         (clopidogrel), last dose was 1 day prior to procedure.                         After reviewing the risks and benefits, the patient                         was deemed in satisfactory condition to undergo the                         procedure.                        After obtaining informed consent, the endoscope was                         passed under direct vision. Throughout the procedure,                         the patient's blood pressure, pulse, and oxygen                         saturations were monitored continuously. The Endoscope                         was introduced through the mouth, and advanced to the                         second part of duodenum. Findings:      One cratered esophageal ulcer with no stigmata of recent bleeding was       found in the lower third of the esophagus.      A medium-sized hiatal hernia was present.      Localized moderate inflammation was found in the gastric antrum.      Mild inflammation characterized by erythema was found in the duodenal       bulb and in the first portion of the duodenum. Impression:            -  Esophageal ulcer with no stigmata of recent bleeding.                        - Medium-sized hiatal hernia.                        - Gastritis.                        - Duodenitis.                        - No specimens collected. Recommendation:        - Return patient to hospital ward for ongoing care.                        - Clear liquid diet.                        - Use a proton pump inhibitor PO BID. Procedure Code(s):     --- Professional ---                        [phone], Esophagogastroduodenoscopy, flexible,                         transoral;  diagnostic, including collection of                         specimen(s) by brushing or washing, when performed                         (separate procedure) Diagnosis Code(s):     --- Professional ---                        K92.1, Melena (includes Hematochezia)                        K22.10, Ulcer of esophagus without bleeding                        K29.70, Gastritis, unspecified, without bleeding CPT copyright 2022 American Medical Association. All rights reserved. The codes documented in this report are preliminary and upon coder review may  be revised to meet current compliance requirements. [name] MD, MD 09/30/2022 12:03:33 PM This report has been signed electronically. Number of Addenda: 0 Note Initiated On: 09/30/2022 11:53 AM Estimated Blood Loss:  Estimated blood loss: none.      [facility]

## 2022-09-30 NOTE — Consult Note (Signed)
PHARMACY CONSULT NOTE - FOLLOW UP  Pharmacy Consult for Electrolyte Monitoring and Replacement   Recent Labs: Potassium (mmol/L)  Date Value  09/30/2022 4.7   Magnesium (mg/dL)  Date Value  16/02/9603 2.1   Calcium (mg/dL)  Date Value  54/01/8118 8.8 (L)   Albumin (g/dL)  Date Value  14/78/2956 3.8  09/26/2019 4.6   Phosphorus (mg/dL)  Date Value  21/30/8657 3.9   Sodium (mmol/L)  Date Value  09/30/2022 132 (L)  06/18/2020 134    Assessment: 77 y.o. male with PMH HTN, HLD, tobacco use, DM, PAD s/p bilateral iliac stents (09/05/2022) who presents with hypotension, weakness, and melena. Labs notable Hgb 9.8, now down to 8.0. GI has been consulted for EGD to be performed today.  Goal of Therapy:  Electrolytes WNL  Plan:  Magnesium and phosphorus levels have been ordered No other electrolyte replacement warranted at this time Follow-up electrolytes with AM labs  Will M. Dareen Piano, PharmD PGY-1 Pharmacy Resident 09/30/2022 9:11 AM

## 2022-09-30 NOTE — Progress Notes (Signed)
ANTICOAGULATION CONSULT NOTE   Pharmacy Consult for IV heparin Indication: history of right occlusive peroneal vein dvt on 09/22/22  Allergies  Allergen Reactions   Amlodipine Itching    Patient Measurements: Height: 5\' 9"  (175.3 cm) Weight: 81.2 kg (179 lb) IBW/kg (Calculated) : 70.7 Heparin Dosing Weight: 81.2 kg  Vital Signs: Temp: 98.3 F (36.8 C) (05/28 0326) Temp Source: Oral (05/28 0326) BP: 103/59 (05/28 0330) Pulse Rate: 62 (05/28 0330)  Labs: Recent Labs    09/29/22 1457 09/29/22 1827 09/30/22 0450  HGB 9.8*  --  8.0*  HCT 29.5*  --  24.3*  PLT 414*  --  338  APTT  --  28 44*  LABPROT  --  16.0*  --   INR  --  1.3*  --   HEPARINUNFRC  --   --  >1.10*  CREATININE 1.91*  --   --      Estimated Creatinine Clearance: 32.4 mL/min (A) (by C-G formula based on SCr of 1.91 mg/dL (H)).   Medical History: Past Medical History:  Diagnosis Date   Diabetes mellitus without complication (HCC)    borderline   Hepatomegaly    Hyperlipidemia    Hypertension    Substance abuse (HCC)     Medications:  Eliquis prior to admission (last dose 5/27 @0800 )  Assessment: 77 year old male admitted with possible GI bleed. DVT on 09/22/2022.  Hgb 9.8  Goal of Therapy:  aPTT 66-85 seconds NO BOLUS Protocol Monitor platelets by anticoagulation protocol: Yes  05/28 0450 aPTT 44, subtherapeutic / HL >1.1 / Hgb decreased to 8.0   Plan:  Notified provider on call regarding Hgb = 8.0 Heparin stopped at this time pending reassessment of pt later this AM Notified RN to stop heparin Pharmacy to F/U anticoag plan  Otelia Sergeant, PharmD, [facility] 09/30/2022 6:02 AM

## 2022-09-30 NOTE — Consult Note (Signed)
[name], MD [facility]  [address], [address] [place], [place] [address] Phone: [phone] Fax : [phone]  Consultation  Referring Provider:     Dr. Sedalia Muta Primary Care Physician:  Luciana Axe, NP Primary Gastroenterologist:  Dr. Tobi Bastos         Reason for Consultation:     Melena  Date of Admission:  09/29/2022 Date of Consultation:  09/30/2022         HPI:   William Ball is a 77 y.o. male who recently had lower extremity angiography with bilateral iliac stent placement and was on aspirin and Plavix.  The patient states that he was not moving his bowels despite taking multiple laxatives.  The patient then took Epsom salts with water and within an hour the patient started to have bowel movements with dark black stools.  He reports that he was feeling weak and tired after 4 to 5 days of the dark stools and had some nausea.  The patient also felt like his blood pressure was low.  The patient now reports that he has not had a bowel movement since yesterday. The patient had a colonoscopy by Dr. Tobi Bastos back in 2018 with multiple polyps removed and one of them was a tubular adenoma.  The patient was recommended to have a repeat colonoscopy in 5 years from that procedure. Patient's recent labs have shown his hemoglobin and hematocrit have been decreasing with his last normal being on May 9 at 14.2.  Component     Latest Ref Rng 09/15/2022 09/29/2022 09/30/2022  Hemoglobin     13.0 - 17.0 g/dL 13.0 (L)  9.8 (L)  8.0 (L)   HCT     39.0 - 52.0 % 33.1 (L)  29.5 (L)  24.3 (L)    The patient was started on pantoprazole and was put on heparin which has been discontinued. The patient denies any abdominal pain or hematemesis associated with this melena.  Past Medical History:  Diagnosis Date   Diabetes mellitus without complication (HCC)    borderline   Hepatomegaly    Hyperlipidemia    Hypertension    Substance abuse (HCC)     Past Surgical History:  Procedure Laterality Date    ANGIOPLASTY     APPENDECTOMY     BONE MARROW TRANSPLANT Left    ankle.  patient unsure of this but thinks this is what happened   CARDIAC SURGERY     CATARACT EXTRACTION W/PHACO Left 11/13/2015   Procedure: CATARACT EXTRACTION PHACO AND INTRAOCULAR LENS PLACEMENT (IOC);  Surgeon: Galen Manila, MD;  Location: [facility] ORS;  Service: Ophthalmology;  Laterality: Left;  [place] 1.06 AP% 23.9 CDE 15.81 Fluid pack lot # [id number]   CATARACT EXTRACTION W/PHACO Right 11/27/2015   Procedure: CATARACT EXTRACTION PHACO AND INTRAOCULAR LENS PLACEMENT (IOC);  Surgeon: Galen Manila, MD;  Location: [facility];  Service: Ophthalmology;  Laterality: Right;  [place] 1.06 AP% 23.8 CDE 15.80 Fluid pack lot # [id number]   COLONOSCOPY  2012   polyps, diverticulosis , 13yr repeat   COLONOSCOPY WITH PROPOFOL N/A 07/17/2016   Procedure: COLONOSCOPY WITH PROPOFOL;  Surgeon: Wyline Mood, MD;  Location: [facility];  Service: Endoscopy;  Laterality: N/A;   ENDARTERECTOMY FEMORAL Bilateral 09/10/2022   Procedure: ENDARTERECTOMY FEMORAL;  Surgeon: Annice Needy, MD;  Location: [facility];  Service: Vascular;  Laterality: Bilateral;   FRACTURE SURGERY Left 1982   foot, leg.unsure if metal in these parts.possibly replaced ankle   INSERTION OF  ILIAC STENT Bilateral 09/10/2022   Procedure: INSERTION OF ILIAC STENT;  Surgeon: Annice Needy, MD;  Location: [facility];  Service: Vascular;  Laterality: Bilateral;   LOWER EXTREMITY ANGIOGRAPHY Right 09/04/2022   Procedure: Lower Extremity Angiography;  Surgeon: Annice Needy, MD;  Location: [facility];  Service: Cardiovascular;  Laterality: Right;   PERIPHERAL VASCULAR CATHETERIZATION Left 12/27/2015   Procedure: Lower Extremity Angiography;  Surgeon: Annice Needy, MD;  Location: [facility];  Service: Cardiovascular;  Laterality: Left;    Prior to Admission medications   Medication Sig Start Date End Date Taking? Authorizing Provider  albuterol (VENTOLIN HFA) 108 (90 Base)  MCG/ACT inhaler Inhale 1-2 puffs into the lungs every 6 (six) hours as needed for wheezing or shortness of breath.   Yes [provider]  apixaban (ELIQUIS) 5 MG TABS tablet Take 5-10 mg by mouth See admin instructions. Take 2 tablets (10mg ) by mouth twice daily for 7 days then take 1 tablet (5mg ) by mouth twice daily thereafter   Yes [provider]  aspirin EC 81 MG tablet Take 81 mg by mouth daily.   Yes [provider]  atorvastatin (LIPITOR) 40 MG tablet Take 40 mg by mouth daily.   Yes [provider]  carvedilol (COREG) 6.25 MG tablet Take 3.125 mg by mouth 2 (two) times daily.   Yes [provider]  chlorthalidone (HYGROTON) 50 MG tablet Take 1 tablet (50 mg total) by mouth daily. MUST BE SEEN IN CLINIC FOR FURTHER REFILLS ON MEDICATION 05/27/21  Yes Debbe Odea, MD  clopidogrel (PLAVIX) 75 MG tablet Take 1 tablet (75 mg total) by mouth daily. 09/16/22 12/15/22 Yes Darlin Priestly, MD  empagliflozin (JARDIANCE) 10 MG TABS tablet Take 10 mg by mouth daily.   Yes [provider]  folic acid (FOLVITE) 1 MG tablet Take 1 tablet (1 mg total) by mouth daily. 09/16/22  Yes Darlin Priestly, MD  gabapentin (NEURONTIN) 100 MG capsule Take 100 mg by mouth 3 (three) times daily.   Yes [provider]  glipiZIDE (GLUCOTROL XL) 10 MG 24 hr tablet Take 20 mg by mouth daily.   Yes [provider]  insulin degludec (TRESIBA) 100 UNIT/ML FlexTouch Pen Inject 30 Units into the skin at bedtime.   Yes [provider]  Multiple Vitamin (MULTIVITAMIN WITH MINERALS) TABS tablet Take 1 tablet by mouth daily. 09/16/22  Yes Darlin Priestly, MD  nicotine (NICODERM CQ - DOSED IN MG/24 HOURS) 21 mg/24hr patch Place 1 patch (21 mg total) onto the skin daily as needed (nicotine craving). 09/15/22  Yes Darlin Priestly, MD  Omega-3 Fatty Acids (FISH OIL) 1000 MG CAPS Take 1,000 mg by mouth daily.   Yes [provider]  oxyCODONE-acetaminophen (PERCOCET/ROXICET)  5-325 MG tablet Take 1 tablet by mouth every 4 (four) hours as needed for severe pain.   Yes [provider]  polyethylene glycol (MIRALAX / GLYCOLAX) 17 g packet Take 17 g by mouth 2 (two) times daily. 09/15/22  Yes Darlin Priestly, MD  senna-docusate (SENOKOT-S) 8.6-50 MG tablet Take 2 tablets by mouth 2 (two) times daily. 09/15/22  Yes Darlin Priestly, MD  spironolactone (ALDACTONE) 25 MG tablet Take 25 mg by mouth daily.   Yes [provider]  terazosin (HYTRIN) 2 MG capsule Take 2 mg by mouth at bedtime.   Yes [provider]  thiamine (VITAMIN B-1) 100 MG tablet Take 1 tablet (100 mg total) by mouth daily. 09/16/22  Yes Darlin Priestly,  MD  valsartan (DIOVAN) 320 MG tablet Take 160 mg by mouth daily.   Yes [provider]    Family History  Problem Relation Age of Onset   Cancer Mother    Heart disease Mother    Hyperlipidemia Mother    Heart disease Father    Hyperlipidemia Father      Social History   Tobacco Use   Smoking status: Every Day    Packs/day: 1.00    Years: 55.00    Additional pack years: 0.00    Total pack years: 55.00    Types: Cigarettes   Smokeless tobacco: Never  Vaping Use   Vaping Use: Never used  Substance Use Topics   Alcohol use: Yes    Alcohol/week: 28.0 standard drinks of alcohol    Types: 28 Cans of beer per week    Comment: 4-6 daily beer   Drug use: No    Allergies as of 09/29/2022 - Review Complete 09/29/2022  Allergen Reaction Noted   Amlodipine Itching 04/15/2022    Review of Systems:    All systems reviewed and negative except where noted in HPI.   Physical Exam:  Vital signs in last 24 hours: Temp:  [97.7 F (36.5 C)-98.4 F (36.9 C)] 98.3 F (36.8 C) (05/28 0326) Pulse Rate:  [57-71] 62 (05/28 0330) Resp:  [13-25] 25 (05/28 0330) BP: (103-171)/(57-142) 103/59 (05/28 0330) SpO2:  [96 %-100 %] 97 % (05/28 0330) Weight:  [81.2 kg] 81.2 kg (05/27 1454)   General:   Pleasant, cooperative in NAD Head:   Normocephalic and atraumatic. Eyes:   No icterus.   Conjunctiva pink. PERRLA. Ears:  Normal auditory acuity. Neck:  Supple; no masses or thyroidomegaly Lungs: Respirations even and unlabored. Lungs clear to auscultation bilaterally.   No wheezes, crackles, or rhonchi.  Heart:  Regular rate and rhythm;  Without murmur, clicks, rubs or gallops Abdomen:  Soft, nondistended, nontender. Normal bowel sounds. No appreciable masses or hepatomegaly.  No rebound or guarding.  Rectal:  Not performed. Msk:  Symmetrical without gross deformities.    Extremities:  Without edema, cyanosis or clubbing. Neurologic:  Alert and oriented x3;  grossly normal neurologically. Skin:  Intact without significant lesions or rashes. Cervical Nodes:  No significant cervical adenopathy. Psych:  Alert and cooperative. Normal affect.  LAB RESULTS: Recent Labs    09/29/22 1457 09/30/22 0450  WBC 17.3* 16.0*  HGB 9.8* 8.0*  HCT 29.5* 24.3*  PLT 414* 338   BMET Recent Labs    09/29/22 1457 09/30/22 0450  NA 129* 132*  K 5.0 4.7  CL 98 104  CO2 20* 19*  GLUCOSE 170* 185*  BUN 110* 105*  CREATININE 1.91* 1.67*  CALCIUM 9.6 8.8*   LFT Recent Labs    09/29/22 1827  PROT 7.1  ALBUMIN 3.8  AST 46*  ALT 97*  ALKPHOS 91  BILITOT 0.7  BILIDIR <0.1  IBILI NOT CALCULATED   PT/INR Recent Labs    09/29/22 1827  LABPROT 16.0*  INR 1.3*    STUDIES: DG Ankle 2 Views Right  Result Date: 09/29/2022 CLINICAL DATA:  [id number] with ankle pain and purulent cellulitis. EXAM: RIGHT ANKLE - 2 VIEW COMPARISON:  None Available. FINDINGS: There is no evidence of fracture, dislocation, or joint effusion. There is no evidence of arthropathy or acute focal bone abnormality. Moderate posterior and plantar calcaneal enthesopathic spurring is noted. Mild anterior soft tissue fullness is noted with otherwise unremarkable soft tissues. IMPRESSION: 1. Mild anterior soft tissue  fullness without underlying osseous abnormality. 2.  Moderate posterior and plantar calcaneal enthesopathic spurring. Electronically Signed   By: Almira Bar M.D.   On: 09/29/2022 22:13   [place] Lower Ext Art Left Ltd  Result Date: 09/29/2022 CLINICAL DATA:  Recent endovascular stent placement. Redness and drainage at incision site in left groin. EXAM: LEFT LOWER EXTREMITY ARTERIAL DUPLEX SCAN TECHNIQUE: Gray-scale sonography as well as color Doppler and duplex ultrasound was performed to evaluate the left common femoral artery, superficial femoral artery and profunda femoral artery in the area of concern COMPARISON:  None Available. FINDINGS: Left lower Extremity No pseudoaneurysm seen. No visible AV fistula. Hypoechoic material is seen in the left groin adjacent to the vessels and in the left groin, likely small hematoma. Area in the left groin measures approximately 1 cm in maximum diameter. IMPRESSION: No evidence of pseudoaneurysm or AV fistula. Probable small left groin hematoma. Electronically Signed   By: Charlett Nose M.D.   On: 09/29/2022 21:06   US Venous Img Lower Bilateral  Result Date: 09/29/2022 CLINICAL DATA:  Leg swelling.  Follow-up DVT. EXAM: BILATERAL LOWER EXTREMITY VENOUS DOPPLER ULTRASOUND TECHNIQUE: Gray-scale sonography with graded compression, as well as color Doppler and duplex ultrasound were performed to evaluate the lower extremity deep venous systems from the level of the common femoral vein and including the common femoral, femoral, profunda femoral, popliteal and calf veins including the posterior tibial, peroneal and gastrocnemius veins when visible. The superficial great saphenous vein was also interrogated. Spectral Doppler was utilized to evaluate flow at rest and with distal augmentation maneuvers in the common femoral, femoral and popliteal veins. COMPARISON:  Right lower extremity duplex 09/22/2022. FINDINGS: RIGHT LOWER EXTREMITY Common Femoral Vein: No evidence of thrombus. Normal compressibility, respiratory phasicity  and response to augmentation. Saphenofemoral Junction: No evidence of thrombus. Normal compressibility and flow on color Doppler imaging. Profunda Femoral Vein: No evidence of thrombus. Normal compressibility and flow on color Doppler imaging. Femoral Vein: No evidence of thrombus. Normal compressibility, respiratory phasicity and response to augmentation. Popliteal Vein: No evidence of thrombus. Normal compressibility, respiratory phasicity and response to augmentation. Calf Veins: Again seen occlusion of the peroneal vein. Posterior tibial vein is patent. Superficial Great Saphenous Vein: No evidence of thrombus. Normal compressibility. Venous Reflux:  None. Other Findings:  None. LEFT LOWER EXTREMITY Common Femoral Vein: No evidence of thrombus. Normal compressibility, respiratory phasicity and response to augmentation. Saphenofemoral Junction: No evidence of thrombus. Normal compressibility and flow on color Doppler imaging. Profunda Femoral Vein: No evidence of thrombus. Normal compressibility and flow on color Doppler imaging. Femoral Vein: No evidence of thrombus. Normal compressibility, respiratory phasicity and response to augmentation. Popliteal Vein: No evidence of thrombus. Normal compressibility, respiratory phasicity and response to augmentation. Calf Veins: No evidence of thrombus. Normal compressibility and flow on color Doppler imaging. Superficial Great Saphenous Vein: No evidence of thrombus. Normal compressibility. Venous Reflux:  None. Other Findings:  None. IMPRESSION: 1. 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 2. No left lower extremity DVT. Electronically Signed   By: Narda Rutherford M.D.   On: 09/29/2022 19:28   DG Chest Port 1 View  Result Date: 09/29/2022 CLINICAL DATA:  Intractable nausea and vomiting. Weakness and hypotension. EXAM: PORTABLE CHEST 1 VIEW COMPARISON:  Chest radiographs 01/27/2017, CT abdomen and pelvis 01/27/2017  FINDINGS: Moderate atherosclerotic calcifications within the aortic arch. A benign calcified granuloma again overlies the inferolateral left lung, also seen on prior CT. The lungs  are clear. No pleural effusion or pneumothorax. No acute skeletal abnormality. IMPRESSION: No acute cardiopulmonary disease process. Electronically Signed   By: Neita Garnet M.D.   On: 09/29/2022 18:29      Impression / Plan:   Assessment: Principal Problem:   Upper GI bleed Active Problems:   Hyperlipidemia   BPH (benign prostatic hyperplasia)   Personal history of tobacco use, presenting hazards to health   Hypertension associated with diabetes (HCC)   PVD (peripheral vascular disease) (HCC)   Hyponatremia   Overweight (BMI 25.0-29.9)   AKI (acute kidney injury) (HCC)   Leukocytosis   Cellulitis of left groin   Deep venous thrombosis (DVT) of right peroneal vein (HCC)   Essential hypertension   Insulin dependent type 2 diabetes mellitus (HCC)   Right ankle pain   William Ball is a 77 y.o. y/o male with who comes in with anemia hypotension and melena.  The patient denies taking any NSAIDs and his last black stool was yesterday.  His hemoglobin was 14.22 weeks ago.  Plan:  Due to the patient's drop in hemoglobin from 9.8 to 8.0 today the patient will be set up for an EGD.  The patient has been explained the plan and agrees with it.  The patient will be kept n.p.o. till after the procedure.  Thank you for involving me in the care of this patient.      LOS: 1 day   [name], MD, [facility] 09/30/2022, 7:27 AM,  Pager [phone] 7am-5pm  Check AMION for 5pm -7am coverage and on weekends   Note: This dictation was prepared with Dragon dictation along with smaller phrase technology. Any transcriptional errors that result from this process are unintentional.

## 2022-10-01 ENCOUNTER — Encounter: Payer: Self-pay | Admitting: Gastroenterology

## 2022-10-01 DIAGNOSIS — K922 Gastrointestinal hemorrhage, unspecified: Secondary | ICD-10-CM | POA: Diagnosis not present

## 2022-10-01 LAB — CBC
HCT: 23.8 % — ABNORMAL LOW (ref 39.0–52.0)
Hemoglobin: 7.9 g/dL — ABNORMAL LOW (ref 13.0–17.0)
MCH: 30.4 pg (ref 26.0–34.0)
MCHC: 33.2 g/dL (ref 30.0–36.0)
MCV: 91.5 fL (ref 80.0–100.0)
Platelets: 329 K/uL (ref 150–400)
RBC: 2.6 MIL/uL — ABNORMAL LOW (ref 4.22–5.81)
RDW: 13.1 % (ref 11.5–15.5)
WBC: 10.6 K/uL — ABNORMAL HIGH (ref 4.0–10.5)
nRBC: 0 % (ref 0.0–0.2)

## 2022-10-01 LAB — GLUCOSE, CAPILLARY
Glucose-Capillary: 122 mg/dL — ABNORMAL HIGH (ref 70–99)
Glucose-Capillary: 121 mg/dL — ABNORMAL HIGH (ref 70–99)
Glucose-Capillary: 141 mg/dL — ABNORMAL HIGH (ref 70–99)
Glucose-Capillary: 211 mg/dL — ABNORMAL HIGH (ref 70–99)

## 2022-10-01 LAB — BASIC METABOLIC PANEL WITH GFR
Anion gap: 8 (ref 5–15)
BUN: 79 mg/dL — ABNORMAL HIGH (ref 8–23)
CO2: 22 mmol/L (ref 22–32)
Calcium: 9.2 mg/dL (ref 8.9–10.3)
Chloride: 105 mmol/L (ref 98–111)
Creatinine, Ser: 1.56 mg/dL — ABNORMAL HIGH (ref 0.61–1.24)
GFR, Estimated: 45 mL/min — ABNORMAL LOW
Glucose, Bld: 169 mg/dL — ABNORMAL HIGH (ref 70–99)
Potassium: 4.6 mmol/L (ref 3.5–5.1)
Sodium: 135 mmol/L (ref 135–145)

## 2022-10-01 LAB — MAGNESIUM: Magnesium: 2.5 mg/dL — ABNORMAL HIGH (ref 1.7–2.4)

## 2022-10-01 LAB — LEGIONELLA PNEUMOPHILA SEROGP 1 UR AG: L. pneumophila Serogp 1 Ur Ag: NEGATIVE

## 2022-10-01 MED ORDER — ASPIRIN 81 MG PO TBEC
81.0000 mg | DELAYED_RELEASE_TABLET | Freq: Every day | ORAL | Status: DC
  Administered 2022-10-01 – 2022-10-16 (×16): 81 mg via ORAL
  Filled 2022-10-01 (×16): qty 1

## 2022-10-01 MED ORDER — CLOPIDOGREL BISULFATE 75 MG PO TABS
75.0000 mg | ORAL_TABLET | Freq: Every day | ORAL | Status: DC
  Administered 2022-10-01 – 2022-10-16 (×16): 75 mg via ORAL
  Filled 2022-10-01 (×16): qty 1

## 2022-10-01 MED ORDER — SODIUM CHLORIDE 0.9 % IV BOLUS
500.0000 mL | Freq: Once | INTRAVENOUS | Status: AC
  Administered 2022-10-01: 500 mL via INTRAVENOUS

## 2022-10-01 NOTE — Progress Notes (Signed)
Progress Note    10/01/2022 4:47 PM 1 Day Post-Op  Subjective: [name] is a 77 year old male with a history of hypertension, hyperlipidemia, tobacco use, diabetes mellitus 2 non-insulin-dependent, PAD now status post bilateral femoral endarterectomies with bilateral iliac stent placement on 09/05/2022.  He is also down to have a right lower extremity DVT.   Patient presented to [facility] emergency department with generalized weakness and hypotension.  She was diagnosed with acute on chronic anemia and GI team was consulted.  On 09/30/22 he underwent EGD which showed gastritis/duodenitis and an esophageal ulcer without any stigmata of recent bleeding.  Patient had endorsed black tarry stools prior to coming to the emergency room.  He also endorsed that he went to see his general practitioner and the placed him on Eliquis 5 mg twice daily for DVT treatment.   Vitals:   10/01/22 1036 10/01/22 1556  BP:  (!) 128/57  Pulse:  (!) 54  Resp:  20  Temp:  98 F (36.7 C)  SpO2: 98% 100%   Physical Exam: Cardiac:  RRR, Normal S1 and S2, No gallpos, clicks or rubs. Lungs: Normal nonlabored breathing, without rales rhonchi or wheezing.  Clear throughout on auscultation. Incisions: Left groin site with staples intact from prior endarterectomy, right groin site with staples intact from prior endarterectomy.  Both clean dry no signs or symptoms of infection. Extremities: The lower extremities with palpable pulses.  Bilateral lower extremities warm to touch. Abdomen: Positive bowel sounds, soft, nontender and nondistended  Neurologic: Alert and oriented x 3, follows all commands appropriately, answers all questions appropriately. CBC    Component Value Date/Time   WBC 10.6 (H) 10/01/2022 0541   RBC 2.60 (L) 10/01/2022 0541   HGB 7.9 (L) 10/01/2022 0541   HGB 14.0 06/13/2019 1612   HCT 23.8 (L) 10/01/2022 0541   HCT 41.5 06/13/2019 1612   PLT 329 10/01/2022 0541   PLT 258 06/13/2019 1612   MCV  91.5 10/01/2022 0541   MCV 92 06/13/2019 1612   MCH 30.4 10/01/2022 0541   MCHC 33.2 10/01/2022 0541   RDW 13.1 10/01/2022 0541   RDW 12.8 06/13/2019 1612   LYMPHSABS 4.3 (H) 09/29/2022 1457   LYMPHSABS 2.1 06/13/2019 1612   MONOABS 1.0 09/29/2022 1457   EOSABS 1.5 (H) 09/29/2022 1457   EOSABS 0.6 (H) 06/13/2019 1612   BASOSABS 0.1 09/29/2022 1457   BASOSABS 0.1 06/13/2019 1612    BMET    Component Value Date/Time   NA 135 10/01/2022 0541   NA 134 06/18/2020 1556   K 4.6 10/01/2022 0541   CL 105 10/01/2022 0541   CO2 22 10/01/2022 0541   GLUCOSE 169 (H) 10/01/2022 0541   BUN 79 (H) 10/01/2022 0541   BUN 21 06/18/2020 1556   CREATININE 1.56 (H) 10/01/2022 0541   CALCIUM 9.2 10/01/2022 0541   GFRNONAA 45 (L) 10/01/2022 0541   GFRAA 66 06/18/2020 1556    INR    Component Value Date/Time   INR 1.3 (H) 09/29/2022 1827     Intake/Output Summary (Last 24 hours) at 10/01/2022 1647 Last data filed at 10/01/2022 1526 Gross per 24 hour  Intake 2366.96 ml  Output --  Net 2366.96 ml     Assessment/Plan:  78 y.o. male is s/p bilateral femoral endarterectomy with bilateral iliac stent placement on 09/05/2022 1 Day Post-Op   Patient recovering as expected.  She was placed on IV antibiotics for questionable cellulitis of the left groin.  This appears to be healing  well without any infection.  Patient was also started on Eliquis 5 mg twice daily for known right lower extremity DVT which appears to have caused his acute on chronic anemia and possible esophageal ulcer.  Skin surgery recommends stopping the Eliquis.  Patient will need to be on aspirin and Plavix post his surgical procedure.  Patient will follow-up as previously scheduled for staple removal with vascular surgery in clinic. Vascular surgery okay for patient discharge to home when hospitalist team deems appropriate.  DVT prophylaxis: ASA 81 mg daily and Plavix 75 mg daily.   Marcie Bal [facility] 10/01/2022 4:47 PM

## 2022-10-01 NOTE — Progress Notes (Signed)
PROGRESS NOTE    William Ball  ZOX:[id number] DOB: 1945-05-19 DOA: 09/29/2022 PCP: Luciana Axe, NP   Brief Narrative:  77 year old with history of HTN, HLD, tobacco use, non-insulin-dependent DM2, constipation, PAD status post bilateral femoral endarterectomy with bilateral iliac stent placement on 09/05/2022, occlusive right lower extremity DVT comes to the ED with weakness and hypotension.  Patient was noted to have hemoglobin of 9.8, started on PPI, given antibiotics in the ED.  Patient was diagnosed with acute on chronic anemia.  GI team consulted.  EGD 5/28 showed gastritis/duodenitis and esophageal ulcer without any stigmata of recent bleeding.  GI recommended PPI.  Seen by vascular team and for now discontinue Eliquis as patient will not need DVT treatment but will need to continue aspirin and Plavix.   Assessment & Plan:  Principal Problem:   Upper GI bleed Active Problems:   Hyperlipidemia   BPH (benign prostatic hyperplasia)   Personal history of tobacco use, presenting hazards to health   Hypertension associated with diabetes (HCC)   PVD (peripheral vascular disease) (HCC)   Hyponatremia   Overweight (BMI 25.0-29.9)   AKI (acute kidney injury) (HCC)   Leukocytosis   Cellulitis of left groin   Deep venous thrombosis (DVT) of right peroneal vein (HCC)   Essential hypertension   Insulin dependent type 2 diabetes mellitus (HCC)   Right ankle pain       Assessment and Plan: * Upper GI bleed Acute blood loss anemia on anticoagulation Baseline hemoglobin around 12.0 and slowly drifted down.  GI team consulted.  EGD 5/28 showed gastritis/duodenitis and esophageal ulcer without any stigmata of recent bleeding.  GI recommended PPI.  Seen by vascular team and for now discontinue Eliquis as patient will not need DVT treatment but will need to continue aspirin and Plavix. Transfuse if Hb <8    Right ankle pain X-ray shows soft tissue swelling but no fracture.  RICE.  PT/OT,  follow-up outpatient.  Insulin dependent type 2 diabetes mellitus (HCC) Peripheral neuropathy Outpatient glipizide and Jardiance on hold.  Continue Semglee 30 units daily.  Sliding scale and Accu-Cheks Gabapentin  Essential hypertension Continue Coreg.  Other home medications are on hold. 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  No need for Eliquis at this time.  Resume aspirin and Plavix.  Sciatica Nerve Pain RLE Was started on Outpatient Prednisone taper by PCP, last day of prednisone today  Cellulitis of left groin Ultrasound shows may be small groin hematoma.  Continue IV Ancef  Leukocytosis On prednisone taper, end date 10/01/2022 CBC in the AM  AKI (acute kidney injury) (HCC), resolved Baseline creatinine 1.5, admission creatinine 1.9> 1.5  Overweight (BMI 25.0-29.9) This complicates overall care and prognosis.   Hyponatremia Improved  BPH (benign prostatic hyperplasia) Home terazosin not resumed on admission due to hypotension and concerns of upper GI bleed  Hyperlipidemia On Lipitor  Orthostatis= 500cc NS bolus  PT/OT= hh, FACE TO FACE COMPLETE.    DVT prophylaxis: Place TED hose Start: 09/29/22 1736 Code Status: Full Family Communication:  Wife at bedside Status is: Inpatient Improving.  Will get PT/OT.  Will discharge once cleared by vascular       Diet Orders (From admission, onward)     Start     Ordered   09/30/22 1519  Diet clear liquid Room service appropriate? Yes; Fluid  consistency: Thin  Diet effective now       Question Answer Comment  Room service appropriate? Yes   Fluid consistency: Thin      09/30/22 1518            Subjective: Doing ok no complaints.    Examination:  Constitutional: Not in acute distress Respiratory: Clear to  auscultation bilaterally Cardiovascular: Normal sinus rhythm, no rubs Abdomen: Nontender nondistended good bowel sounds Musculoskeletal: No edema noted Skin: No rashes seen Neurologic: CN 2-12 grossly intact.  And nonfocal Psychiatric: Normal judgment and insight. Alert and oriented x 3. Normal mood.    Objective: Vitals:   09/30/22 1954 10/01/22 0024 10/01/22 0420 10/01/22 0810  BP: (!) 125/48 (!) 115/48 (!) 124/51 (!) 124/56  Pulse: (!) 55 (!) 55 (!) 58 65  Resp: 18 18 18 20   Temp: 97.6 F (36.4 C) 97.7 F (36.5 C) 98 F (36.7 C) 98 F (36.7 C)  TempSrc:    Oral  SpO2: 100% 100% 100% 96%  Weight:      Height:        Intake/Output Summary (Last 24 hours) at 10/01/2022 0810 Last data filed at 10/01/2022 1610 Gross per 24 hour  Intake 940 ml  Output 675 ml  Net 265 ml   Filed Weights   09/29/22 1454 09/30/22 1424  Weight: 81.2 kg 77.8 kg    Scheduled Meds:  atorvastatin  40 mg Oral QHS   carvedilol  3.125 mg Oral BID   folic acid  1 mg Oral Daily   gabapentin  100 mg Oral TID   insulin aspart  0-5 Units Subcutaneous QHS   insulin aspart  0-9 Units Subcutaneous TID WC   insulin glargine-yfgn  30 Units Subcutaneous Daily   multivitamin with minerals  1 tablet Oral Daily   pantoprazole (PROTONIX) IV  40 mg Intravenous BID   predniSONE  5 mg Oral Q breakfast   senna-docusate  2 tablet Oral BID   thiamine  100 mg Oral Daily   Continuous Infusions:   ceFAZolin (ANCEF) IV 2 g (10/01/22 0705)    Nutritional status     Body mass index is 25.34 kg/m.  Data Reviewed:   CBC: Recent Labs  Lab 09/29/22 1457 09/30/22 0450 10/01/22 0541  WBC 17.3* 16.0* 10.6*  NEUTROABS 10.1*  --   --   HGB 9.8* 8.0* 7.9*  HCT 29.5* 24.3* 23.8*  MCV 91.3 91.0 91.5  PLT 414* 338 329   Basic Metabolic Panel: Recent Labs  Lab 09/29/22 1457 09/30/22 0450 10/01/22 0541  NA 129* 132* 135  K 5.0 4.7 4.6  CL 98 104 105  CO2 20* 19* 22  GLUCOSE 170* 185* 169*  BUN 110* 105*  79*  CREATININE 1.91* 1.67* 1.56*  CALCIUM 9.6 8.8* 9.2  MG  --  2.9* 2.5*  PHOS  --  5.0*  --    GFR: Estimated Creatinine Clearance: 39.7 mL/min (A) (by C-G formula based on SCr of 1.56 mg/dL (H)). Liver Function Tests: Recent Labs  Lab 09/29/22 1827  AST 46*  ALT 97*  ALKPHOS 91  BILITOT 0.7  PROT 7.1  ALBUMIN 3.8   No results for input(s): "LIPASE", "AMYLASE" in the last 168 hours. No results for input(s): "AMMONIA" in the last 168 hours. Coagulation Profile: Recent Labs  Lab 09/29/22 1827  INR 1.3*   Cardiac Enzymes: No results for input(s): "CKTOTAL", "CKMB", "CKMBINDEX", "TROPONINI" in the last 168 hours. BNP (last 3 results) No results for input(s): "  PROBNP" in the last 8760 hours. HbA1C: No results for input(s): "HGBA1C" in the last 72 hours. CBG: Recent Labs  Lab 09/29/22 2245 09/30/22 0849 09/30/22 1429 09/30/22 1629 09/30/22 2028  GLUCAP 160* 148* 170* 134* 154*   Lipid Profile: No results for input(s): "CHOL", "HDL", "LDLCALC", "TRIG", "CHOLHDL", "LDLDIRECT" in the last 72 hours. Thyroid Function Tests: No results for input(s): "TSH", "T4TOTAL", "FREET4", "T3FREE", "THYROIDAB" in the last 72 hours. Anemia Panel: Recent Labs    09/29/22 1737 09/29/22 1827  VITAMINB12 654  --   FOLATE  --  26.0  FERRITIN  --  188  TIBC  --  400  IRON  --  77  RETICCTPCT  --  3.0   Sepsis Labs: Recent Labs  Lab 09/29/22 1827  LATICACIDVEN 1.2    Recent Results (from the past 240 hour(s))  Blood culture (routine x 2)     Status: None (Preliminary result)   Collection Time: 09/29/22  6:32 PM   Specimen: BLOOD  Result Value Ref Range Status   Specimen Description BLOOD LEFT ANTECUBITAL  Final   Special Requests   Final    BOTTLES DRAWN AEROBIC AND ANAEROBIC Blood Culture adequate volume   Culture   Final    NO GROWTH 2 DAYS Performed at [facility], [address] [place], [place] [address]    Report Status PENDING  Incomplete  Blood  culture (routine x 2)     Status: None (Preliminary result)   Collection Time: 09/29/22  6:32 PM   Specimen: BLOOD  Result Value Ref Range Status   Specimen Description BLOOD BLOOD LEFT FOREARM  Final   Special Requests   Final    BOTTLES DRAWN AEROBIC AND ANAEROBIC Blood Culture results may not be optimal due to an excessive volume of blood received in culture bottles   Culture   Final    NO GROWTH 2 DAYS Performed at [facility], [address] [place], [place] [address]    Report Status PENDING  Incomplete         Radiology Studies: DG Ankle 2 Views Right  Result Date: 09/29/2022 CLINICAL DATA:  [id number] with ankle pain and purulent cellulitis. EXAM: RIGHT ANKLE - 2 VIEW COMPARISON:  None Available. FINDINGS: There is no evidence of fracture, dislocation, or joint effusion. There is no evidence of arthropathy or acute focal bone abnormality. Moderate posterior and plantar calcaneal enthesopathic spurring is noted. Mild anterior soft tissue fullness is noted with otherwise unremarkable soft tissues. IMPRESSION: 1. Mild anterior soft tissue fullness without underlying osseous abnormality. 2. Moderate posterior and plantar calcaneal enthesopathic spurring. Electronically Signed   By: Almira Bar M.D.   On: 09/29/2022 22:13   [place] Lower Ext Art Left Ltd  Result Date: 09/29/2022 CLINICAL DATA:  Recent endovascular stent placement. Redness and drainage at incision site in left groin. EXAM: LEFT LOWER EXTREMITY ARTERIAL DUPLEX SCAN TECHNIQUE: Gray-scale sonography as well as color Doppler and duplex ultrasound was performed to evaluate the left common femoral artery, superficial femoral artery and profunda femoral artery in the area of concern COMPARISON:  None Available. FINDINGS: Left lower Extremity No pseudoaneurysm seen. No visible AV fistula. Hypoechoic material is seen in the left groin adjacent to the vessels and in the left groin, likely small hematoma. Area in the left groin  measures approximately 1 cm in maximum diameter. IMPRESSION: No evidence of pseudoaneurysm or AV fistula. Probable small left groin hematoma. Electronically Signed   By: Charlett Nose M.D.  On: 09/29/2022 21:06   US Venous Img Lower Bilateral  Result Date: 09/29/2022 CLINICAL DATA:  Leg swelling.  Follow-up DVT. EXAM: BILATERAL LOWER EXTREMITY VENOUS DOPPLER ULTRASOUND TECHNIQUE: Gray-scale sonography with graded compression, as well as color Doppler and duplex ultrasound were performed to evaluate the lower extremity deep venous systems from the level of the common femoral vein and including the common femoral, femoral, profunda femoral, popliteal and calf veins including the posterior tibial, peroneal and gastrocnemius veins when visible. The superficial great saphenous vein was also interrogated. Spectral Doppler was utilized to evaluate flow at rest and with distal augmentation maneuvers in the common femoral, femoral and popliteal veins. COMPARISON:  Right lower extremity duplex 09/22/2022. FINDINGS: RIGHT LOWER EXTREMITY Common Femoral Vein: No evidence of thrombus. Normal compressibility, respiratory phasicity and response to augmentation. Saphenofemoral Junction: No evidence of thrombus. Normal compressibility and flow on color Doppler imaging. Profunda Femoral Vein: No evidence of thrombus. Normal compressibility and flow on color Doppler imaging. Femoral Vein: No evidence of thrombus. Normal compressibility, respiratory phasicity and response to augmentation. Popliteal Vein: No evidence of thrombus. Normal compressibility, respiratory phasicity and response to augmentation. Calf Veins: Again seen occlusion of the peroneal vein. Posterior tibial vein is patent. Superficial Great Saphenous Vein: No evidence of thrombus. Normal compressibility. Venous Reflux:  None. Other Findings:  None. LEFT LOWER EXTREMITY Common Femoral Vein: No evidence of thrombus. Normal compressibility, respiratory phasicity and  response to augmentation. Saphenofemoral Junction: No evidence of thrombus. Normal compressibility and flow on color Doppler imaging. Profunda Femoral Vein: No evidence of thrombus. Normal compressibility and flow on color Doppler imaging. Femoral Vein: No evidence of thrombus. Normal compressibility, respiratory phasicity and response to augmentation. Popliteal Vein: No evidence of thrombus. Normal compressibility, respiratory phasicity and response to augmentation. Calf Veins: No evidence of thrombus. Normal compressibility and flow on color Doppler imaging. Superficial Great Saphenous Vein: No evidence of thrombus. Normal compressibility. Venous Reflux:  None. Other Findings:  None. IMPRESSION: 1. 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 2. No left lower extremity DVT. Electronically Signed   By: Narda Rutherford M.D.   On: 09/29/2022 19:28   DG Chest Port 1 View  Result Date: 09/29/2022 CLINICAL DATA:  Intractable nausea and vomiting. Weakness and hypotension. EXAM: PORTABLE CHEST 1 VIEW COMPARISON:  Chest radiographs 01/27/2017, CT abdomen and pelvis 01/27/2017 FINDINGS: Moderate atherosclerotic calcifications within the aortic arch. A benign calcified granuloma again overlies the inferolateral left lung, also seen on prior CT. The lungs are clear. No pleural effusion or pneumothorax. No acute skeletal abnormality. IMPRESSION: No acute cardiopulmonary disease process. Electronically Signed   By: Neita Garnet M.D.   On: 09/29/2022 18:29           LOS: 2 days   Time spent= 35 mins    Myriam Brandhorst Joline Maxcy, MD [facility]  If 7PM-7AM, please contact night-coverage  10/01/2022, 8:10 AM

## 2022-10-01 NOTE — Anesthesia Postprocedure Evaluation (Signed)
Anesthesia Post Note  Patient: William Ball  Procedure(s) Performed: ESOPHAGOGASTRODUODENOSCOPY (EGD) WITH PROPOFOL  Patient location during evaluation: Endoscopy Anesthesia Type: General Level of consciousness: awake and alert Pain management: pain level controlled Vital Signs Assessment: post-procedure vital signs reviewed and stable Respiratory status: spontaneous breathing, nonlabored ventilation, respiratory function stable and patient connected to nasal cannula oxygen Cardiovascular status: blood pressure returned to baseline and stable Postop Assessment: no apparent nausea or vomiting Anesthetic complications: no   No notable events documented.   Last Vitals:  Vitals:   10/01/22 0024 10/01/22 0420  BP: (!) 115/48 (!) 124/51  Pulse: (!) 55 (!) 58  Resp: 18 18  Temp: 36.5 C 36.7 C  SpO2: 100% 100%    Last Pain:  Vitals:   10/01/22 0048  TempSrc:   PainSc: Asleep                 Cleda Mccreedy Vallen Calabrese

## 2022-10-01 NOTE — Evaluation (Signed)
Physical Therapy Evaluation Patient Details Name: William Ball MRN: [id number] DOB: 12/13/1945 Today's Date: 10/01/2022  History of Present Illness  Pt is a 77 y.o. male with history of hypertension, hyperlipidemia, tobacco abuse, diabetes mellitus 2 non-insulin-dependent, constipation, PAD.  Patient is now status post bilateral femoral endarterectomies with bilateral iliac stents placed on 09/10/2026.  He was discharged on 09/15/2022 to home doing well.  He presents to [facility] emergency department on 09/29/2022 with a chief complaint of hypotension and weakness, tarry stools.   Clinical Impression  Pt alert, agreeable to PT reported ongoing R ankle/foot pain (present for a while now). Pt stated since his last admission he has been working with HHPT, utilizing RW and wife assists with ADLs "if needed". Has not returned to IADLs yet.  He performed bed mobility and transfers modI. Pt did use momentum and pulled on RW to come up into standing. He ambulated ~174ft with RW and CGA. Did have a decrease in velocity over time as well as an increase in antalgic gait for RLE. Pt reported feeling "rushy" after ambulation, CNA informed of potential benefit of orthostatic assessment in standing.  Overall the patient demonstrated deficits (see "PT Problem List") that impede the patient's functional abilities, safety, and mobility and would benefit from skilled PT intervention.         Recommendations for follow up therapy are one component of a multi-disciplinary discharge planning process, led by the attending physician.  Recommendations may be updated based on patient status, additional functional criteria and insurance authorization.  Follow Up Recommendations Can patient physically be transported by private vehicle: Yes     Assistance Recommended at Discharge Intermittent Supervision/Assistance  Patient can return home with the following  A little help with bathing/dressing/bathroom;Assistance with  cooking/housework;Assist for transportation;Help with stairs or ramp for entrance    Equipment Recommendations None recommended by PT  Recommendations for Other Services       Functional Status Assessment Patient has had a recent decline in their functional status and demonstrates the ability to make significant improvements in function in a reasonable and predictable amount of time.     Precautions / Restrictions Precautions Precautions: Fall Restrictions Weight Bearing Restrictions: No      Mobility  Bed Mobility Overal bed mobility: Modified Independent                  Transfers Overall transfer level: Modified independent Equipment used: Rolling walker (2 wheels)               General transfer comment: uses momentum and RW to stand despite education    Ambulation/Gait Ambulation/Gait assistance: Min guard Gait Distance (Feet): 130 Feet Assistive device: Rolling walker (2 wheels)         General Gait Details: steady ambulation but did experience a decrease in velocity over time, reported R foot/ankle pain, and increase in antalgic gait noted  Stairs            Wheelchair Mobility    Modified Rankin (Stroke Patients Only)       Balance Overall balance assessment: Modified Independent Sitting-balance support: No upper extremity supported, Feet supported Sitting balance-Leahy Scale: Normal Sitting balance - Comments: steady sitting reaching outside BOS   Standing balance support: Bilateral upper extremity supported Standing balance-Leahy Scale: Fair                               Pertinent Vitals/Pain Pain Assessment Pain  Assessment: 0-10 Pain Score: 4  Pain Location: R foot Pain Descriptors / Indicators: Sore, Tender, Aching, Grimacing Pain Intervention(s): Limited activity within patient's tolerance, Monitored during session, Repositioned    Home Living Family/patient expects to be discharged to:: Private  residence Living Arrangements: Spouse/significant other Available Help at Discharge: Family Type of Home: House Home Access: Stairs to enter   Secretary/administrator of Steps: 5   Home Layout: One level Home Equipment: Agricultural consultant (2 wheels);Cane - single point;Shower seat;Hand held shower head;Adaptive equipment      Prior Function Prior Level of Function : Independent/Modified Independent             Mobility Comments: use of RW since last hospital admission ADLs Comments: reported his wife helps him if he needs it for ADLs. has not returned to IADLs     Hand Dominance        Extremity/Trunk Assessment   Upper Extremity Assessment Upper Extremity Assessment: Overall WFL for tasks assessed    Lower Extremity Assessment Lower Extremity Assessment:  (able to move BLE against gravity)       Communication   Communication: No difficulties  Cognition Arousal/Alertness: Awake/alert Behavior During Therapy: WFL for tasks assessed/performed Overall Cognitive Status: Within Functional Limits for tasks assessed                                          General Comments      Exercises     Assessment/Plan    PT Assessment Patient needs continued PT services  PT Problem List Decreased strength;Decreased mobility;Decreased range of motion;Decreased activity tolerance;Decreased balance;Pain       PT Treatment Interventions DME instruction;Therapeutic activities;Gait training;Therapeutic exercise;Patient/family education;Balance training;Stair training;Functional mobility training;Neuromuscular re-education    PT Goals (Current goals can be found in the Care Plan section)  Acute Rehab PT Goals Patient Stated Goal: to have less pain in RLE PT Goal Formulation: With patient Time For Goal Achievement: 10/15/22 Potential to Achieve Goals: Good    Frequency Min 2X/week     Co-evaluation               AM-PAC PT "6 Clicks" Mobility   Outcome Measure Help needed turning from your back to your side while in a flat bed without using bedrails?: None Help needed moving from lying on your back to sitting on the side of a flat bed without using bedrails?: None Help needed moving to and from a bed to a chair (including a wheelchair)?: None Help needed standing up from a chair using your arms (e.g., wheelchair or bedside chair)?: None Help needed to walk in hospital room?: None Help needed climbing 3-5 steps with a railing? : A Little 6 Click Score: 23    End of Session Equipment Utilized During Treatment: Gait belt Activity Tolerance: Patient tolerated treatment well Patient left: in chair;with call bell/phone within reach Nurse Communication: Mobility status;Other (comment) (potential need for othostatics) PT Visit Diagnosis: Other abnormalities of gait and mobility (R26.89);Muscle weakness (generalized) (M62.81);Pain Pain - Right/Left: Right Pain - part of body: Ankle and joints of foot    Time: 1610-9604 PT Time Calculation (min) (ACUTE ONLY): 13 min   Charges:   PT Evaluation $PT Eval Low Complexity: 1 Low PT Treatments $Therapeutic Activity: 8-22 mins        Olga Coaster PT, DPT 10:48 AM,10/01/22

## 2022-10-01 NOTE — Progress Notes (Signed)
The patient had an upper endoscopy with inflammation seen in the stomach and duodenum but a yielding esophageal ulcer was likely the cause of the patient's issues.  The patient's hemoglobin is stable.  Nothing further to do from a GI point of view at this time.  I will sign off.  Please call if any further GI concerns or questions.  We would like to thank you for the opportunity to participate in the care of William Ball.

## 2022-10-01 NOTE — Progress Notes (Signed)
   10/01/22 1300  Spiritual Encounters  Type of Visit Follow up  Care provided to: Pt and family  Referral source Chaplain team  Reason for visit Trauma  OnCall Visit Yes  Spiritual Framework  Presenting Themes Community and relationships;Impactful experiences and emotions  Community/Connection Friend(s);Family;Faith community  Interventions  Spiritual Care Interventions Made Compassionate presence;Established relationship of care and support;Reflective listening;Encouragement  Spiritual Care Plan  Spiritual Care Issues Still Outstanding Chaplain will continue to follow    Chaplain went to visit patient because his son has just been brought in to the ED and he is really upset and concerned about him. Met with his daughter and son in law as well to see how they were. The patient told me his son has ALS and that it has been hard on his son as well as the rest of the family. Patient and family thanked me for coming by and checking on them. Told the family I will go to the ED to check on his son and his wife.

## 2022-10-01 NOTE — Evaluation (Signed)
Occupational Therapy Evaluation Patient Details Name: William Ball MRN: [id number] DOB: Sep 21, 1945 Today's Date: 10/01/2022   History of Present Illness Pt is a 77 y.o. male with history of hypertension, hyperlipidemia, tobacco abuse, diabetes mellitus 2 non-insulin-dependent, constipation, PAD.  Patient is now status post bilateral femoral endarterectomies with bilateral iliac stents placed on 09/10/2026.  He was discharged on 09/15/2022 to home doing well.  He presents to [facility] emergency department on 09/29/2022 with a chief complaint of hypotension and weakness, tarry stools.   Clinical Impression   William Ball was seen for OT evaluation this date. Prior to hospital admission, pt was requiring some assistance from his spouse for BADL management after recent hospital DC and using a RW for functional mobility. Pt presents to acute OT demonstrating impaired ADL performance and functional mobility 2/2 decreased activity tolerance, increased pain with mobility, and decreased balance (See OT problem list). Pt currently requires SUPERVISION for more exertional ADL management including LB dressing from STS and bathing. He is MOD I with RW for functional mobility.  Pt would benefit from skilled OT services to address noted impairments and functional limitations (see below for any additional details) in order to maximize safety and independence while minimizing falls risk and caregiver burden. Upon hospital discharge, recommend HHOT to maximize pt safety and return to functional independence during meaningful occupations of daily life.         Recommendations for follow up therapy are one component of a multi-disciplinary discharge planning process, led by the attending physician.  Recommendations may be updated based on patient status, additional functional criteria and insurance authorization.   Assistance Recommended at Discharge Frequent or constant Supervision/Assistance  Patient can return home with the  following Assistance with cooking/housework;Assist for transportation;Help with stairs or ramp for entrance    Functional Status Assessment  Patient has had a recent decline in their functional status and demonstrates the ability to make significant improvements in function in a reasonable and predictable amount of time.  Equipment Recommendations  BSC/3in1    Recommendations for Other Services       Precautions / Restrictions Precautions Precautions: Fall Restrictions Weight Bearing Restrictions: No      Mobility Bed Mobility Overal bed mobility: Modified Independent Bed Mobility: Supine to Sit, Sit to Supine     Supine to sit: Modified independent (Device/Increase time) Sit to supine: Modified independent (Device/Increase time)        Transfers Overall transfer level: Modified independent Equipment used: Rolling walker (2 wheels) Transfers: Sit to/from Stand Sit to Stand: Modified independent (Device/Increase time)     Step pivot transfers: Modified independent (Device/Increase time)     General transfer comment: Tends to rely on RW to stand despite education on hand placement.      Balance Overall balance assessment: Modified Independent Sitting-balance support: No upper extremity supported, Feet supported Sitting balance-Leahy Scale: Normal Sitting balance - Comments: steady sitting reaching outside BOS   Standing balance support: During functional activity, Single extremity supported Standing balance-Leahy Scale: Fair Standing balance comment: steady standing with single UE support                           ADL either performed or assessed with clinical judgement   ADL Overall ADL's : Needs assistance/impaired  General ADL Comments: Pt functionally limited by increased pain with mobility and decreased activity tolerance. Is able to perform ADL management with SUPERVISION for safety and use  of RW during session. Uses figure four position to adjust B socks without difficulty.     Vision Patient Visual Report: No change from baseline       Perception     Praxis      Pertinent Vitals/Pain Pain Assessment Pain Assessment: 0-10 Pain Score: 4  Pain Location: R foot Pain Descriptors / Indicators: Sore, Tender, Aching, Grimacing Pain Intervention(s): Limited activity within patient's tolerance, Monitored during session, Repositioned, Utilized relaxation techniques     Hand Dominance     Extremity/Trunk Assessment Upper Extremity Assessment Upper Extremity Assessment: Overall WFL for tasks assessed   Lower Extremity Assessment Lower Extremity Assessment: Generalized weakness;RLE deficits/detail RLE Deficits / Details: increased pain in R ankle/LE this date. Able to move BLE [facility] No focal deficits appreciated.   Cervical / Trunk Assessment Cervical / Trunk Assessment: Normal   Communication Communication Communication: No difficulties   Cognition Arousal/Alertness: Awake/alert Behavior During Therapy: WFL for tasks assessed/performed Overall Cognitive Status: Within Functional Limits for tasks assessed                                       General Comments       Exercises Other Exercises Other Exercises: Educated on role of OT in acute setting, safety, falls prevention, and DC recs.   Shoulder Instructions      Home Living Family/patient expects to be discharged to:: Private residence Living Arrangements: Spouse/significant other Available Help at Discharge: Family;Available 24 hours/day Type of Home: House Home Access: Stairs to enter [employer] of Steps: 5   Home Layout: One level     Bathroom Shower/Tub: Chief Strategy Officer: Standard     Home Equipment: Agricultural consultant (2 wheels);Cane - single point;Shower seat;Hand held shower head;Adaptive equipment Adaptive Equipment: Reacher        Prior  Functioning/Environment Prior Level of Function : Independent/Modified Independent             Mobility Comments: use of RW since last hospital admission ADLs Comments: Reports he is generally MOD I with RW for ADLs, wife has been helping PRN since last admission. Needs assist with IADL management.        OT Problem List: Decreased strength;Pain;Impaired balance (sitting and/or standing);Decreased knowledge of use of DME or AE;Decreased activity tolerance      OT Treatment/Interventions: Self-care/ADL training;Therapeutic exercise;Therapeutic activities;DME and/or AE instruction;Patient/family education;Balance training    OT Goals(Current goals can be found in the care plan section) Acute Rehab OT Goals Patient Stated Goal: to go home OT Goal Formulation: With patient/family Time For Goal Achievement: 09/25/22 Potential to Achieve Goals: Good ADL Goals Pt Will Perform Grooming: sitting;with modified independence;standing Pt Will Transfer to Toilet: with modified independence;regular height toilet;bedside commode Pt Will Perform Toileting - Clothing Manipulation and hygiene: with modified independence;sit to/from stand Pt Will Perform Tub/Shower Transfer: with modified independence;ambulating (LRAD)  OT Frequency: Min 2X/week    Co-evaluation              AM-PAC OT "6 Clicks" Daily Activity     Outcome Measure Help from another person eating meals?: None Help from another person taking care of personal grooming?: None Help from another person toileting, which includes using toliet, bedpan, or urinal?: A  Little Help from another person bathing (including washing, rinsing, drying)?: A Little Help from another person to put on and taking off regular upper body clothing?: None Help from another person to put on and taking off regular lower body clothing?: A Little 6 Click Score: 21   End of Session Equipment Utilized During Treatment: Rolling walker (2 wheels)  Activity  Tolerance: Patient tolerated treatment well;No increased pain Patient left: in bed;with call bell/phone within reach;with family/visitor present  OT Visit Diagnosis: Other abnormalities of gait and mobility (R26.89)                Time: 1610-9604 OT Time Calculation (min): 13 min Charges:  OT General Charges $OT Visit: 1 Visit OT Evaluation $OT Eval Low Complexity: 1 Low  Rockney Ghee, M.S., OTR/L 10/01/22, 12:13 PM

## 2022-10-02 DIAGNOSIS — K922 Gastrointestinal hemorrhage, unspecified: Secondary | ICD-10-CM | POA: Diagnosis not present

## 2022-10-02 LAB — BASIC METABOLIC PANEL WITH GFR
Anion gap: 7 (ref 5–15)
BUN: 55 mg/dL — ABNORMAL HIGH (ref 8–23)
CO2: 22 mmol/L (ref 22–32)
Calcium: 8.8 mg/dL — ABNORMAL LOW (ref 8.9–10.3)
Chloride: 108 mmol/L (ref 98–111)
Creatinine, Ser: 1.34 mg/dL — ABNORMAL HIGH (ref 0.61–1.24)
GFR, Estimated: 55 mL/min — ABNORMAL LOW
Glucose, Bld: 82 mg/dL (ref 70–99)
Potassium: 4.3 mmol/L (ref 3.5–5.1)
Sodium: 137 mmol/L (ref 135–145)

## 2022-10-02 LAB — HEMOGLOBIN AND HEMATOCRIT, BLOOD
HCT: 26.7 % — ABNORMAL LOW (ref 39.0–52.0)
Hemoglobin: 9 g/dL — ABNORMAL LOW (ref 13.0–17.0)

## 2022-10-02 LAB — CBC
HCT: 22 % — ABNORMAL LOW (ref 39.0–52.0)
Hemoglobin: 7.1 g/dL — ABNORMAL LOW (ref 13.0–17.0)
MCH: 29.7 pg (ref 26.0–34.0)
MCHC: 32.3 g/dL (ref 30.0–36.0)
MCV: 92.1 fL (ref 80.0–100.0)
Platelets: 296 K/uL (ref 150–400)
RBC: 2.39 MIL/uL — ABNORMAL LOW (ref 4.22–5.81)
RDW: 13.1 % (ref 11.5–15.5)
WBC: 7.8 K/uL (ref 4.0–10.5)
nRBC: 0 % (ref 0.0–0.2)

## 2022-10-02 LAB — GLUCOSE, CAPILLARY
Glucose-Capillary: 81 mg/dL (ref 70–99)
Glucose-Capillary: 173 mg/dL — ABNORMAL HIGH (ref 70–99)
Glucose-Capillary: 130 mg/dL — ABNORMAL HIGH (ref 70–99)
Glucose-Capillary: 80 mg/dL (ref 70–99)

## 2022-10-02 LAB — PREPARE RBC (CROSSMATCH)

## 2022-10-02 LAB — MAGNESIUM: Magnesium: 2.1 mg/dL (ref 1.7–2.4)

## 2022-10-02 MED ORDER — PANTOPRAZOLE SODIUM 40 MG PO TBEC
40.0000 mg | DELAYED_RELEASE_TABLET | Freq: Two times a day (BID) | ORAL | Status: DC
  Administered 2022-10-02 – 2022-10-16 (×29): 40 mg via ORAL
  Filled 2022-10-02 (×30): qty 1

## 2022-10-02 MED ORDER — SODIUM CHLORIDE 0.9% IV SOLUTION: Freq: Once | INTRAVENOUS | Status: DC

## 2022-10-02 NOTE — Consult Note (Signed)
PHARMACY CONSULT NOTE  Pharmacy Consult for Electrolyte Monitoring and Replacement   Recent Labs: Potassium (mmol/L)  Date Value  10/02/2022 4.3   Magnesium (mg/dL)  Date Value  16/02/9603 2.1   Calcium (mg/dL)  Date Value  54/01/8118 8.8 (L)   Albumin (g/dL)  Date Value  14/78/2956 3.8  09/26/2019 4.6   Phosphorus (mg/dL)  Date Value  21/30/8657 5.0 (H)   Sodium (mmol/L)  Date Value  10/02/2022 137  06/18/2020 134   Assessment: 77 y.o. male with PMH HTN, HLD, tobacco use, DM, PAD s/p bilateral iliac stents (09/05/2022) who presents with hypotension, weakness, and melena. Labs notable Hgb 9.8, now down to 8.0. GI has been consulted for EGD to be performed.  Goal of Therapy:  Electrolytes within normal limits  Plan:  --No electrolyte replacement indicated at this time --Follow-up electrolytes with AM labs tomorrow  Tressie Ellis 10/02/2022 7:57 AM

## 2022-10-02 NOTE — Progress Notes (Signed)
Physical Therapy Treatment Patient Details Name: DELFIN Ball MRN: [id number] DOB: [birth year] Today's Date: 10/02/2022   History of Present Illness Pt is a 77 y.o. male with history of hypertension, hyperlipidemia, tobacco abuse, diabetes mellitus 2 non-insulin-dependent, constipation, PAD.  Patient is now status post bilateral femoral endarterectomies with bilateral iliac stents placed on 09/10/2026.  He was discharged on 09/15/2022 to home doing well.  He presents to [facility] emergency department on 09/29/2022 with a chief complaint of hypotension and weakness, tarry stools.    PT Comments    Pt A&Ox4, reported mild RLE pain that did increase with activity. Orthostatic vitals assessed, negative and pt without symptoms. Pt continued to demonstrate good tolerance to activity, modI for transfers (one reminder about RW positioning during  transfers) and ambulated ~225ft with RW and CGA. Returned to room with needs in reach. The patient would benefit from further skilled PT intervention to continue to progress towards goals.   Recommendations for follow up therapy are one component of a multi-disciplinary discharge planning process, led by the attending physician.  Recommendations may be updated based on patient status, additional functional criteria and insurance authorization.  Follow Up Recommendations  Can patient physically be transported by private vehicle: Yes    Assistance Recommended at Discharge Intermittent Supervision/Assistance  Patient can return home with the following A little help with bathing/dressing/bathroom;Assistance with cooking/housework;Assist for transportation;Help with stairs or ramp for entrance   Equipment Recommendations  None recommended by PT    Recommendations for Other Services       Precautions / Restrictions Precautions Precautions: Fall Restrictions Weight Bearing Restrictions: No     Mobility  Bed Mobility               General bed mobility  comments: up in recliner at start/end of session    Transfers Overall transfer level: Modified independent Equipment used: Rolling walker (2 wheels) Transfers: Sit to/from Stand             General transfer comment: modI, but pt did leave RW outside BOS, reminder to utilize throughout transfer    Ambulation/Gait Ambulation/Gait assistance: Min guard Gait Distance (Feet): 220 Feet Assistive device: Rolling walker (2 wheels)         General Gait Details: some increased antalgic gait with time, a few standing rest breaks   Stairs             Wheelchair Mobility    Modified Rankin (Stroke Patients Only)       Balance Overall balance assessment: Modified Independent Sitting-balance support: No upper extremity supported, Feet supported Sitting balance-Leahy Scale: Normal     Standing balance support: During functional activity, Single extremity supported Standing balance-Leahy Scale: Fair                              Cognition Arousal/Alertness: Awake/alert Behavior During Therapy: WFL for tasks assessed/performed Overall Cognitive Status: Within Functional Limits for tasks assessed                                          Exercises Other Exercises Other Exercises: orthostatic vitals assessed, negative    General Comments        Pertinent Vitals/Pain Pain Assessment Pain Assessment: Faces Pain Location: R foot Pain Descriptors / Indicators: Sore, Tender, Aching, Grimacing Pain Intervention(s): Limited activity within patient's tolerance,  Monitored during session, Repositioned    Home Living                          Prior Function            PT Goals (current goals can now be found in the care plan section) Progress towards PT goals: Progressing toward goals    Frequency    Min 2X/week      PT Plan Current plan remains appropriate    Co-evaluation              AM-PAC PT "6 Clicks"  Mobility   Outcome Measure  Help needed turning from your back to your side while in a flat bed without using bedrails?: None Help needed moving from lying on your back to sitting on the side of a flat bed without using bedrails?: None Help needed moving to and from a bed to a chair (including a wheelchair)?: None Help needed standing up from a chair using your arms (e.g., wheelchair or bedside chair)?: None Help needed to walk in hospital room?: None Help needed climbing 3-5 steps with a railing? : A Little 6 Click Score: 23    End of Session Equipment Utilized During Treatment: Gait belt Activity Tolerance: Patient tolerated treatment well Patient left: in chair;with call bell/phone within reach Nurse Communication: Other (comment) (orthostatic vitals to MD, negative) PT Visit Diagnosis: Other abnormalities of gait and mobility (R26.89);Muscle weakness (generalized) (M62.81);Pain Pain - Right/Left: Right Pain - part of body: Ankle and joints of foot     Time: 1003-1020 PT Time Calculation (min) (ACUTE ONLY): 17 min  Charges:  $Therapeutic Activity: 8-22 mins                     Olga Coaster PT, DPT 12:03 PM,10/02/22

## 2022-10-02 NOTE — Progress Notes (Signed)
Mobility Specialist - Progress Note     10/02/22 1202  Mobility  Activity Stood at bedside;Ambulated with assistance in hallway  Level of Assistance Standby assist, set-up cues, supervision of patient - no hands on  Assistive Device Front wheel walker  Distance Ambulated (ft) 80 ft  Range of Motion/Exercises Active  Activity Response Tolerated well  Mobility Referral Yes  $Mobility charge 1 Mobility  Mobility Specialist Start Time (ACUTE ONLY) 1150  Mobility Specialist Stop Time (ACUTE ONLY) 1203  Mobility Specialist Time Calculation (min) (ACUTE ONLY) 13 min   Pt resting in bed on RA upon entry. Pt endorses pain in right foot but agreeable to ambulation. Pt STS and ambualtes around NS SBA with RW. Pt returned to bed and left with needs in reach.   Johnathan Hausen Mobility Specialist 10/02/22, 12:05 PM

## 2022-10-02 NOTE — Progress Notes (Signed)
PROGRESS NOTE    [name]  ZOX:[id number] DOB: Jun 18, 1945 DOA: 09/29/2022 PCP: Luciana Axe, NP   Brief Narrative:  77 year old with history of HTN, HLD, tobacco use, non-insulin-dependent DM2, constipation, PAD status post bilateral femoral endarterectomy with bilateral iliac stent placement on 09/05/2022, occlusive right lower extremity DVT comes to the ED with weakness and hypotension.  Patient was noted to have hemoglobin of 9.8, started on PPI, given antibiotics in the ED.  Patient was diagnosed with acute on chronic anemia.  GI team consulted.  EGD 5/28 showed gastritis/duodenitis and esophageal ulcer without any stigmata of recent bleeding.  GI recommended PPI.  Seen by vascular team and for now discontinue Eliquis as patient will not need DVT treatment but will need to continue aspirin and Plavix.  Will been slowly drifting down, 1 unit PRBC.   Assessment & Plan:  Principal Problem:   Upper GI bleed Active Problems:   Hyperlipidemia   BPH (benign prostatic hyperplasia)   Personal history of tobacco use, presenting hazards to health   Hypertension associated with diabetes (HCC)   PVD (peripheral vascular disease) (HCC)   Hyponatremia   Overweight (BMI 25.0-29.9)   AKI (acute kidney injury) (HCC)   Leukocytosis   Cellulitis of left groin   Deep venous thrombosis (DVT) of right peroneal vein (HCC)   Essential hypertension   Insulin dependent type 2 diabetes mellitus (HCC)   Right ankle pain       Assessment and Plan: * Upper GI bleed Acute blood loss anemia on anticoagulation Baseline hemoglobin around 12.0 and slowly drifted down.  GI team consulted.  EGD 5/28 showed gastritis/duodenitis and esophageal ulcer without any stigmata of recent bleeding.  GI recommended PPI.  Discussed with vascular and GI, will discontinue Eliquis.  Continue aspirin and Plavix. Hemoglobin drifted down to 7.1 this morning, 1 unit PRBC.  Will ask GI if there would be any role for video  capsule.   Right ankle pain X-ray shows soft tissue swelling but no fracture.  RICE.  PT/OT, follow-up outpatient.  Insulin dependent type 2 diabetes mellitus (HCC) Peripheral neuropathy Outpatient glipizide and Jardiance on hold.  Continue Semglee 30 units daily.  Sliding scale and Accu-Cheks Gabapentin  Essential hypertension Continue Coreg.  Other home medications are on hold. 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  No need for Eliquis at this time.  Resume aspirin and Plavix.  Sciatica Nerve Pain RLE Was started on Outpatient Prednisone taper by PCP, last day of prednisone today  Cellulitis of left groin Ultrasound shows may be small groin hematoma.  Continue IV Ancef  Leukocytosis On prednisone taper, end date 10/01/2022 CBC in the AM  AKI (acute kidney injury) (HCC), resolved Baseline creatinine 1.5, admission creatinine 1.9> 1.5  Overweight (BMI 25.0-29.9) This complicates overall care and prognosis.   Hyponatremia Improved  BPH (benign prostatic hyperplasia) Home terazosin not resumed on admission due to hypotension and concerns of upper GI bleed  Hyperlipidemia On Lipitor  Orthostatis= 500cc NS bolus  PT/OT= hh, FACE TO FACE COMPLETE.    DVT prophylaxis: Place TED hose Start: 09/29/22 1736 Code Status: Full Family Communication:   Status is: Inpatient Planning for 1 unit PRBC today.     Diet Orders (From admission, onward)     Start     Ordered   10/02/22 0747  Diet heart  healthy/carb modified Room service appropriate? Yes; Fluid consistency: Thin  Diet effective now       Comments: Advance as tolerated - no current abd pain ot n/v  Question Answer Comment  Diet-HS Snack? Nothing   Room service appropriate? Yes   Fluid consistency: Thin      10/02/22  0749            Subjective: Dizziness is better today.  Still having some melena. No other complaint Ambulating with the help of physical therapy  Examination: Constitutional: Not in acute distress Respiratory: Clear to auscultation bilaterally Cardiovascular: Normal sinus rhythm, no rubs Abdomen: Nontender nondistended good bowel sounds Musculoskeletal: No edema noted Skin: No rashes seen Neurologic: CN 2-12 grossly intact.  And nonfocal Psychiatric: Normal judgment and insight. Alert and oriented x 3. Normal mood.    Objective: Vitals:   10/01/22 2015 10/02/22 0047 10/02/22 0429 10/02/22 0816  BP: (!) 127/44 117/60 (!) 134/48 (!) 134/48  Pulse: (!) 55 (!) 56 (!) 53 (!) 54  Resp: 18 18 18 20   Temp: 98.8 F (37.1 C) 97.9 F (36.6 C) (!) 97.3 F (36.3 C) 97.7 F (36.5 C)  TempSrc:    Oral  SpO2: 98% 99% 99% 98%  Weight:      Height:        Intake/Output Summary (Last 24 hours) at 10/02/2022 4098 Last data filed at 10/01/2022 2327 Gross per 24 hour  Intake 1826.96 ml  Output --  Net 1826.96 ml   Filed Weights   09/29/22 1454 09/30/22 1424  Weight: 81.2 kg 77.8 kg    Scheduled Meds:  sodium chloride   Intravenous Once   aspirin EC  81 mg Oral Daily   atorvastatin  40 mg Oral QHS   carvedilol  3.125 mg Oral BID   clopidogrel  75 mg Oral Daily   folic acid  1 mg Oral Daily   gabapentin  100 mg Oral TID   insulin aspart  0-5 Units Subcutaneous QHS   insulin aspart  0-9 Units Subcutaneous TID WC   insulin glargine-yfgn  30 Units Subcutaneous Daily   multivitamin with minerals  1 tablet Oral Daily   senna-docusate  2 tablet Oral BID   thiamine  100 mg Oral Daily   Continuous Infusions:   ceFAZolin (ANCEF) IV 2 g (10/02/22 0558)    Nutritional status     Body mass index is 25.34 kg/m.  Data Reviewed:   CBC: Recent Labs  Lab 09/29/22 1457 09/30/22 0450 10/01/22 0541 10/02/22 0540  WBC 17.3* 16.0* 10.6* 7.8  NEUTROABS 10.1*  --   --   --    HGB 9.8* 8.0* 7.9* 7.1*  HCT 29.5* 24.3* 23.8* 22.0*  MCV 91.3 91.0 91.5 92.1  PLT 414* 338 329 296   Basic Metabolic Panel: Recent Labs  Lab 09/29/22 1457 09/30/22 0450 10/01/22 0541 10/02/22 0540  NA 129* 132* 135 137  K 5.0 4.7 4.6 4.3  CL 98 104 105 108  CO2 20* 19* 22 22  GLUCOSE 170* 185* 169* 82  BUN 110* 105* 79* 55*  CREATININE 1.91* 1.67* 1.56* 1.34*  CALCIUM 9.6 8.8* 9.2 8.8*  MG  --  2.9* 2.5* 2.1  PHOS  --  5.0*  --   --    GFR: Estimated Creatinine Clearance: 46.2 mL/min (A) (by C-G formula based on SCr of 1.34 mg/dL (H)). Liver Function Tests: Recent Labs  Lab 09/29/22 1827  AST 46*  ALT 97*  ALKPHOS 91  BILITOT  0.7  PROT 7.1  ALBUMIN 3.8   No results for input(s): "LIPASE", "AMYLASE" in the last 168 hours. No results for input(s): "AMMONIA" in the last 168 hours. Coagulation Profile: Recent Labs  Lab 09/29/22 1827  INR 1.3*   Cardiac Enzymes: No results for input(s): "CKTOTAL", "CKMB", "CKMBINDEX", "TROPONINI" in the last 168 hours. BNP (last 3 results) No results for input(s): "PROBNP" in the last 8760 hours. HbA1C: No results for input(s): "HGBA1C" in the last 72 hours. CBG: Recent Labs  Lab 10/01/22 0805 10/01/22 1140 10/01/22 1553 10/01/22 2016 10/02/22 0814  GLUCAP 122* 121* 141* 211* 81   Lipid Profile: No results for input(s): "CHOL", "HDL", "LDLCALC", "TRIG", "CHOLHDL", "LDLDIRECT" in the last 72 hours. Thyroid Function Tests: No results for input(s): "TSH", "T4TOTAL", "FREET4", "T3FREE", "THYROIDAB" in the last 72 hours. Anemia Panel: Recent Labs    09/29/22 1737 09/29/22 1827  VITAMINB12 654  --   FOLATE  --  26.0  FERRITIN  --  188  TIBC  --  400  IRON  --  77  RETICCTPCT  --  3.0   Sepsis Labs: Recent Labs  Lab 09/29/22 1827  LATICACIDVEN 1.2    Recent Results (from the past 240 hour(s))  Blood culture (routine x 2)     Status: None (Preliminary result)   Collection Time: 09/29/22  6:32 PM   Specimen:  BLOOD  Result Value Ref Range Status   Specimen Description BLOOD LEFT ANTECUBITAL  Final   Special Requests   Final    BOTTLES DRAWN AEROBIC AND ANAEROBIC Blood Culture adequate volume   Culture   Final    NO GROWTH 3 DAYS Performed at [facility], [address] [place], [place] [address]    Report Status PENDING  Incomplete  Blood culture (routine x 2)     Status: None (Preliminary result)   Collection Time: 09/29/22  6:32 PM   Specimen: BLOOD  Result Value Ref Range Status   Specimen Description BLOOD BLOOD LEFT FOREARM  Final   Special Requests   Final    BOTTLES DRAWN AEROBIC AND ANAEROBIC Blood Culture results may not be optimal due to an excessive volume of blood received in culture bottles   Culture   Final    NO GROWTH 3 DAYS Performed at [facility], [address] [place], [place] [address]    Report Status PENDING  Incomplete         Radiology Studies: No results found.         LOS: 3 days   Time spent= 35 mins    Carols Clemence Joline Maxcy, MD [facility]  If 7PM-7AM, please contact night-coverage  10/02/2022, 8:22 AM

## 2022-10-02 NOTE — Care Management Important Message (Signed)
Important Message  Patient Details  Name: William Ball MRN: [id number] Date of Birth: [birth year]   Medicare Important Message Given:  Yes     Johnell Comings 10/02/2022, 11:00 AM

## 2022-10-03 ENCOUNTER — Encounter
Admission: EM | Disposition: A | Discharge status: Other Acute Inpatient Hospital | Payer: Self-pay | Source: Home / Self Care | Attending: Internal Medicine

## 2022-10-03 DIAGNOSIS — D500 Iron deficiency anemia secondary to blood loss (chronic): Secondary | ICD-10-CM | POA: Diagnosis not present

## 2022-10-03 DIAGNOSIS — K922 Gastrointestinal hemorrhage, unspecified: Secondary | ICD-10-CM

## 2022-10-03 HISTORY — PX: GIVENS CAPSULE STUDY: SHX5432

## 2022-10-03 LAB — CBC
HCT: 24.8 % — ABNORMAL LOW (ref 39.0–52.0)
Hemoglobin: 8.3 g/dL — ABNORMAL LOW (ref 13.0–17.0)
MCH: 29.5 pg (ref 26.0–34.0)
MCHC: 33.5 g/dL (ref 30.0–36.0)
MCV: 88.3 fL (ref 80.0–100.0)
Platelets: 284 K/uL (ref 150–400)
RBC: 2.81 MIL/uL — ABNORMAL LOW (ref 4.22–5.81)
RDW: 14.5 % (ref 11.5–15.5)
WBC: 7 K/uL (ref 4.0–10.5)
nRBC: 0 % (ref 0.0–0.2)

## 2022-10-03 LAB — GLUCOSE, CAPILLARY
Glucose-Capillary: 76 mg/dL (ref 70–99)
Glucose-Capillary: 158 mg/dL — ABNORMAL HIGH (ref 70–99)
Glucose-Capillary: 83 mg/dL (ref 70–99)
Glucose-Capillary: 105 mg/dL — ABNORMAL HIGH (ref 70–99)

## 2022-10-03 LAB — TYPE AND SCREEN
ABO/RH(D): A POS
Antibody Screen: NEGATIVE
Unit division: 0

## 2022-10-03 LAB — BPAM RBC
Blood Product Expiration Date: 202406242359
ISSUE DATE / TIME: 202405301328
Unit Type and Rh: 6200

## 2022-10-03 LAB — BASIC METABOLIC PANEL WITH GFR
Anion gap: 7 (ref 5–15)
BUN: 34 mg/dL — ABNORMAL HIGH (ref 8–23)
CO2: 22 mmol/L (ref 22–32)
Calcium: 8.6 mg/dL — ABNORMAL LOW (ref 8.9–10.3)
Chloride: 107 mmol/L (ref 98–111)
Creatinine, Ser: 1.3 mg/dL — ABNORMAL HIGH (ref 0.61–1.24)
GFR, Estimated: 57 mL/min — ABNORMAL LOW
Glucose, Bld: 70 mg/dL (ref 70–99)
Potassium: 4.2 mmol/L (ref 3.5–5.1)
Sodium: 136 mmol/L (ref 135–145)

## 2022-10-03 LAB — MAGNESIUM: Magnesium: 1.8 mg/dL (ref 1.7–2.4)

## 2022-10-03 SURGERY — GIVENS CAPSULE STUDY
Anesthesia: General

## 2022-10-03 NOTE — Progress Notes (Signed)
Physical Therapy Treatment Patient Details Name: William Ball MRN: [id number] DOB: 1946/03/15 Today's Date: 10/03/2022   History of Present Illness Pt is a 77 y.o. male with history of hypertension, hyperlipidemia, tobacco abuse, diabetes mellitus 2 non-insulin-dependent, constipation, PAD.  Patient is now status post bilateral femoral endarterectomies with bilateral iliac stents placed on 09/10/2026.  He was discharged on 09/15/2022 to home doing well.  He presents to [facility] emergency department on 09/29/2022 with a chief complaint of hypotension and weakness, tarry stools.    PT Comments    Patient was sitting in their chair at the beginning of the session. Demonstrated modified independence when performing sit to stand. Ambulated 225 ft around the nursing station with min guard. Patient said they felt good and requested to be able to walk independently in the hallway.    Recommendations for follow up therapy are one component of a multi-disciplinary discharge planning process, led by the attending physician.  Recommendations may be updated based on patient status, additional functional criteria and insurance authorization.  Follow Up Recommendations  Can patient physically be transported by private vehicle: Yes    Assistance Recommended at Discharge PRN  Patient can return home with the following A little help with bathing/dressing/bathroom;Assistance with cooking/housework;Assist for transportation;Help with stairs or ramp for entrance   Equipment Recommendations  None recommended by PT    Recommendations for Other Services       Precautions / Restrictions Precautions Precautions: Fall Restrictions Weight Bearing Restrictions: No     Mobility  Bed Mobility               General bed mobility comments: Patient started and ended the session in the recliner.    Transfers Overall transfer level: Modified independent Equipment used: Rolling walker (2 wheels) Transfers: Sit  to/from Stand Sit to Stand: Modified independent (Device/Increase time)           General transfer comment: Modified independence when standing from chair with RW    Ambulation/Gait Ambulation/Gait assistance: Supervision Gait Distance (Feet): 225 Feet Assistive device: Rolling walker (2 wheels) Gait Pattern/deviations: Step-through pattern, Decreased step length - right, Decreased step length - left, Trunk flexed       General Gait Details: Cueing needed for positioning with RW. verbally cued patient to stand up straight and to bring the RW closer.   Stairs             Wheelchair Mobility    Modified Rankin (Stroke Patients Only)       Balance Overall balance assessment: Modified Independent Sitting-balance support: No upper extremity supported, Feet supported Sitting balance-Leahy Scale: Normal Sitting balance - Comments: steady sitting reaching outside BOS   Standing balance support: Bilateral upper extremity supported, During functional activity Standing balance-Leahy Scale: Normal Standing balance comment: patient was steady when standing with RW.                            Cognition Arousal/Alertness: Awake/alert Behavior During Therapy: WFL for tasks assessed/performed Overall Cognitive Status: Within Functional Limits for tasks assessed                                          Exercises      General Comments        Pertinent Vitals/Pain Pain Assessment Pain Assessment: No/denies pain    Home Living  Prior Function            PT Goals (current goals can now be found in the care plan section) Acute Rehab PT Goals Patient Stated Goal: to go home PT Goal Formulation: With patient Time For Goal Achievement: 10/15/22 Potential to Achieve Goals: Good Progress towards PT goals: Progressing toward goals    Frequency    Min 2X/week      PT Plan Current plan remains  appropriate    Co-evaluation              AM-PAC PT "6 Clicks" Mobility   Outcome Measure  Help needed turning from your back to your side while in a flat bed without using bedrails?: None Help needed moving from lying on your back to sitting on the side of a flat bed without using bedrails?: A Little Help needed moving to and from a bed to a chair (including a wheelchair)?: None Help needed standing up from a chair using your arms (e.g., wheelchair or bedside chair)?: None Help needed to walk in hospital room?: None Help needed climbing 3-5 steps with a railing? : A Little 6 Click Score: 22    End of Session   Activity Tolerance: Patient tolerated treatment well Patient left: in chair;with call bell/phone within reach   PT Visit Diagnosis: Other abnormalities of gait and mobility (R26.89);Muscle weakness (generalized) (M62.81);Pain     Time: 1351-1359 PT Time Calculation (min) (ACUTE ONLY): 8 min  Charges:                        Malachi Carl, SPT   Malachi Carl 10/03/2022, 2:14 PM

## 2022-10-03 NOTE — Progress Notes (Addendum)
PROGRESS NOTE    William Ball  ZOX:[id number] DOB: 1945-06-05 DOA: 09/29/2022 PCP: Luciana Axe, NP   Brief Narrative:  77 year old with history of HTN, HLD, tobacco use, non-insulin-dependent DM2, constipation, PAD status post bilateral femoral endarterectomy with bilateral iliac stent placement on 09/05/2022, occlusive right lower extremity DVT comes to the ED with weakness and hypotension.  Patient was noted to have hemoglobin of 9.8, started on PPI, given antibiotics in the ED.  Patient was diagnosed with acute on chronic anemia.  GI team consulted.  EGD 5/28 showed gastritis/duodenitis and esophageal ulcer without any stigmata of recent bleeding.  GI recommended PPI.  Seen by vascular team and for now discontinue Eliquis as patient will not need DVT treatment but will need to continue aspirin and Plavix.  Hemoglobin slowly drifted down therefore received 1 unit of PRBC.  Discussed with GI who placed video capsule.   Assessment & Plan:  Principal Problem:   Upper GI bleed Active Problems:   Hyperlipidemia   BPH (benign prostatic hyperplasia)   Personal history of tobacco use, presenting hazards to health   Hypertension associated with diabetes (HCC)   PVD (peripheral vascular disease) (HCC)   Hyponatremia   Overweight (BMI 25.0-29.9)   AKI (acute kidney injury) (HCC)   Leukocytosis   Cellulitis of left groin   Deep venous thrombosis (DVT) of right peroneal vein (HCC)   Essential hypertension   Insulin dependent type 2 diabetes mellitus (HCC)   Right ankle pain       Assessment and Plan: * Upper GI bleed Acute blood loss anemia on anticoagulation Baseline hemoglobin around 12.0 and slowly drifted down.  GI team consulted.  EGD 5/28 showed gastritis/duodenitis and esophageal ulcer without any stigmata of recent bleeding.  GI recommended PPI.  Discussed with vascular and GI, will discontinue Eliquis.  Continue aspirin and Plavix. Hemoglobin drifted down to 7.1 status post 1  unit PRBC 5/30.  Hemoglobin trended up to 9.0 but again slowly trending down.  Discussed with GI who placed video capsule today morning.  Currently on clear liquid diet.  Right ankle pain X-ray shows soft tissue swelling but no fracture.  RICE.  PT/OT, follow-up outpatient.  Insulin dependent type 2 diabetes mellitus (HCC) Peripheral neuropathy Outpatient glipizide and Jardiance on hold.  Continue Semglee 30 units daily.  Sliding scale and Accu-Cheks Gabapentin  Essential hypertension Continue Coreg.  Other home medications are on hold. 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  Continue aspirin and Plavix but no need for Eliquis anymore  Sciatica Nerve Pain RLE Completed outpatient course of prednisone taper  Cellulitis of left groin Ultrasound shows may be small groin hematoma.  Continue IV Ancef, total 7 days.   AKI (acute kidney injury) (HCC), resolved Baseline creatinine 1.5, admission creatinine 1.9> 1.3  Overweight (BMI 25.0-29.9) This complicates overall care and prognosis.   Hyponatremia Improved  BPH (benign prostatic hyperplasia) Home terazosin not resumed on admission due to hypotension and concerns of upper GI bleed  Hyperlipidemia On Lipitor  Orthostatis= 500cc NS bolus  PT/OT= hh, FACE TO FACE COMPLETE.    DVT prophylaxis: Place TED hose Start: 09/29/22 1736 Code Status: Full Family Communication:   Status is: Inpatient Ongoing evaluation for melanotic stool.  Video capsule.  Hopefully discharge in 1-2 days     Diet Orders (From admission, onward)  Start     Ordered   10/02/22 1133  Diet clear liquid Room service appropriate? Yes; Fluid consistency: Thin  Diet effective now       Question Answer Comment  Room service appropriate? Yes   Fluid consistency:  Thin      10/02/22 1132            Subjective: Seen and bedside, no complaints.  Tells me his stools are less dark appearing today.  Examination: Constitutional: Not in acute distress Respiratory: Clear to auscultation bilaterally Cardiovascular: Normal sinus rhythm, no rubs Abdomen: Nontender nondistended good bowel sounds Musculoskeletal: No edema noted Skin: No rashes seen Neurologic: CN 2-12 grossly intact.  And nonfocal Psychiatric: Normal judgment and insight. Alert and oriented x 3. Normal mood.  Objective: Vitals:   10/02/22 2015 10/02/22 2327 10/03/22 0512 10/03/22 0758  BP: (!) 149/52 (!) 117/50 (!) 129/46 (!) 128/54  Pulse: (!) 57 (!) 51 (!) 45 (!) 51  Resp: 18 18 18 18   Temp: 98.6 F (37 C) 97.9 F (36.6 C) 98.2 F (36.8 C) 97.9 F (36.6 C)  TempSrc:      SpO2: 99% 97% 100% 99%  Weight:      Height:        Intake/Output Summary (Last 24 hours) at 10/03/2022 0853 Last data filed at 10/02/2022 1845 Gross per 24 hour  Intake 2505.5 ml  Output --  Net 2505.5 ml   Filed Weights   09/29/22 1454 09/30/22 1424  Weight: 81.2 kg 77.8 kg    Scheduled Meds:  sodium chloride   Intravenous Once   aspirin EC  81 mg Oral Daily   atorvastatin  40 mg Oral QHS   carvedilol  3.125 mg Oral BID   clopidogrel  75 mg Oral Daily   folic acid  1 mg Oral Daily   gabapentin  100 mg Oral TID   insulin aspart  0-5 Units Subcutaneous QHS   insulin aspart  0-9 Units Subcutaneous TID WC   insulin glargine-yfgn  30 Units Subcutaneous Daily   multivitamin with minerals  1 tablet Oral Daily   pantoprazole  40 mg Oral BID   senna-docusate  2 tablet Oral BID   thiamine  100 mg Oral Daily   Continuous Infusions:   ceFAZolin (ANCEF) IV 2 g (10/03/22 0545)    Nutritional status     Body mass index is 25.34 kg/m.  Data Reviewed:   CBC: Recent Labs  Lab 09/29/22 1457 09/30/22 0450 10/01/22 0541 10/02/22 0540 10/02/22 2007 10/03/22 0506  WBC 17.3* 16.0* 10.6*  7.8  --  7.0  NEUTROABS 10.1*  --   --   --   --   --   HGB 9.8* 8.0* 7.9* 7.1* 9.0* 8.3*  HCT 29.5* 24.3* 23.8* 22.0* 26.7* 24.8*  MCV 91.3 91.0 91.5 92.1  --  88.3  PLT 414* 338 329 296  --  284   Basic Metabolic Panel: Recent Labs  Lab 09/29/22 1457 09/30/22 0450 10/01/22 0541 10/02/22 0540 10/03/22 0506  NA 129* 132* 135 137 136  K 5.0 4.7 4.6 4.3 4.2  CL 98 104 105 108 107  CO2 20* 19* 22 22 22   GLUCOSE 170* 185* 169* 82 70  BUN 110* 105* 79* 55* 34*  CREATININE 1.91* 1.67* 1.56* 1.34* 1.30*  CALCIUM 9.6 8.8* 9.2 8.8* 8.6*  MG  --  2.9* 2.5* 2.1 1.8  PHOS  --  5.0*  --   --   --  GFR: Estimated Creatinine Clearance: 47.6 mL/min (A) (by C-G formula based on SCr of 1.3 mg/dL (H)). Liver Function Tests: Recent Labs  Lab 09/29/22 1827  AST 46*  ALT 97*  ALKPHOS 91  BILITOT 0.7  PROT 7.1  ALBUMIN 3.8   No results for input(s): "LIPASE", "AMYLASE" in the last 168 hours. No results for input(s): "AMMONIA" in the last 168 hours. Coagulation Profile: Recent Labs  Lab 09/29/22 1827  INR 1.3*   Cardiac Enzymes: No results for input(s): "CKTOTAL", "CKMB", "CKMBINDEX", "TROPONINI" in the last 168 hours. BNP (last 3 results) No results for input(s): "PROBNP" in the last 8760 hours. HbA1C: No results for input(s): "HGBA1C" in the last 72 hours. CBG: Recent Labs  Lab 10/02/22 0814 10/02/22 1203 10/02/22 1542 10/02/22 2012 10/03/22 0759  GLUCAP 81 173* 130* 80 76   Lipid Profile: No results for input(s): "CHOL", "HDL", "LDLCALC", "TRIG", "CHOLHDL", "LDLDIRECT" in the last 72 hours. Thyroid Function Tests: No results for input(s): "TSH", "T4TOTAL", "FREET4", "T3FREE", "THYROIDAB" in the last 72 hours. Anemia Panel: No results for input(s): "VITAMINB12", "FOLATE", "FERRITIN", "TIBC", "IRON", "RETICCTPCT" in the last 72 hours.  Sepsis Labs: Recent Labs  Lab 09/29/22 1827  LATICACIDVEN 1.2    Recent Results (from the past 240 hour(s))  Blood culture  (routine x 2)     Status: None (Preliminary result)   Collection Time: 09/29/22  6:32 PM   Specimen: BLOOD  Result Value Ref Range Status   Specimen Description BLOOD LEFT ANTECUBITAL  Final   Special Requests   Final    BOTTLES DRAWN AEROBIC AND ANAEROBIC Blood Culture adequate volume   Culture   Final    NO GROWTH 4 DAYS Performed at [facility], [address] [place], [place] [address]    Report Status PENDING  Incomplete  Blood culture (routine x 2)     Status: None (Preliminary result)   Collection Time: 09/29/22  6:32 PM   Specimen: BLOOD  Result Value Ref Range Status   Specimen Description BLOOD BLOOD LEFT FOREARM  Final   Special Requests   Final    BOTTLES DRAWN AEROBIC AND ANAEROBIC Blood Culture results may not be optimal due to an excessive volume of blood received in culture bottles   Culture   Final    NO GROWTH 4 DAYS Performed at [facility], [address], [place], [place] [address]    Report Status PENDING  Incomplete         Radiology Studies: No results found.         LOS: 4 days   Time spent= 35 mins    Genieve Ramaswamy Joline Maxcy, MD [facility]  If 7PM-7AM, please contact night-coverage  10/03/2022, 8:53 AM

## 2022-10-03 NOTE — Consult Note (Signed)
PHARMACY CONSULT NOTE  Pharmacy Consult for Electrolyte Monitoring and Replacement   Recent Labs: Potassium (mmol/L)  Date Value  10/03/2022 4.2   Magnesium (mg/dL)  Date Value  44/05/270 1.8   Calcium (mg/dL)  Date Value  53/66/4403 8.6 (L)   Albumin (g/dL)  Date Value  47/42/5956 3.8  09/26/2019 4.6   Phosphorus (mg/dL)  Date Value  38/75/6433 5.0 (H)   Sodium (mmol/L)  Date Value  10/03/2022 136  06/18/2020 134   Assessment: 77 y.o. male with PMH HTN, HLD, tobacco use, DM, PAD s/p bilateral iliac stents (09/05/2022) who presents with hypotension, weakness, and melena. Labs notable Hgb 9.8, now down to 8.0. GI has been consulted for EGD to be performed.  Goal of Therapy:  Electrolytes within normal limits  Plan:  --No electrolyte replacement indicated at this time --Follow-up electrolytes with AM labs tomorrow  Tressie Ellis 10/03/2022 7:41 AM

## 2022-10-04 DIAGNOSIS — D62 Acute posthemorrhagic anemia: Secondary | ICD-10-CM

## 2022-10-04 DIAGNOSIS — E1169 Type 2 diabetes mellitus with other specified complication: Secondary | ICD-10-CM | POA: Diagnosis not present

## 2022-10-04 DIAGNOSIS — K922 Gastrointestinal hemorrhage, unspecified: Secondary | ICD-10-CM | POA: Diagnosis not present

## 2022-10-04 LAB — BASIC METABOLIC PANEL WITH GFR
Anion gap: 6 (ref 5–15)
BUN: 20 mg/dL (ref 8–23)
CO2: 23 mmol/L (ref 22–32)
Calcium: 8.6 mg/dL — ABNORMAL LOW (ref 8.9–10.3)
Chloride: 109 mmol/L (ref 98–111)
Creatinine, Ser: 1.26 mg/dL — ABNORMAL HIGH (ref 0.61–1.24)
GFR, Estimated: 59 mL/min — ABNORMAL LOW
Glucose, Bld: 54 mg/dL — ABNORMAL LOW (ref 70–99)
Potassium: 4.3 mmol/L (ref 3.5–5.1)
Sodium: 138 mmol/L (ref 135–145)

## 2022-10-04 LAB — CBC
HCT: 25.1 % — ABNORMAL LOW (ref 39.0–52.0)
Hemoglobin: 8.5 g/dL — ABNORMAL LOW (ref 13.0–17.0)
MCH: 30.1 pg (ref 26.0–34.0)
MCHC: 33.9 g/dL (ref 30.0–36.0)
MCV: 89 fL (ref 80.0–100.0)
Platelets: 260 K/uL (ref 150–400)
RBC: 2.82 MIL/uL — ABNORMAL LOW (ref 4.22–5.81)
RDW: 14.1 % (ref 11.5–15.5)
WBC: 6.5 K/uL (ref 4.0–10.5)
nRBC: 0 % (ref 0.0–0.2)

## 2022-10-04 LAB — CULTURE, BLOOD (ROUTINE X 2)
Culture: NO GROWTH
Culture: NO GROWTH
Special Requests: ADEQUATE

## 2022-10-04 LAB — GLUCOSE, CAPILLARY
Glucose-Capillary: 58 mg/dL — ABNORMAL LOW (ref 70–99)
Glucose-Capillary: 187 mg/dL — ABNORMAL HIGH (ref 70–99)
Glucose-Capillary: 119 mg/dL — ABNORMAL HIGH (ref 70–99)
Glucose-Capillary: 120 mg/dL — ABNORMAL HIGH (ref 70–99)
Glucose-Capillary: 170 mg/dL — ABNORMAL HIGH (ref 70–99)

## 2022-10-04 LAB — MAGNESIUM: Magnesium: 1.7 mg/dL (ref 1.7–2.4)

## 2022-10-04 MED ORDER — MAGNESIUM SULFATE 2 GM/50ML IV SOLN
2.0000 g | Freq: Once | INTRAVENOUS | Status: AC
  Administered 2022-10-04: 2 g via INTRAVENOUS
  Filled 2022-10-04: qty 50

## 2022-10-04 NOTE — Consult Note (Signed)
PHARMACY CONSULT NOTE  Pharmacy Consult for Electrolyte Monitoring and Replacement   Recent Labs: Potassium (mmol/L)  Date Value  10/04/2022 4.3   Magnesium (mg/dL)  Date Value  16/02/9603 1.7   Calcium (mg/dL)  Date Value  54/01/8118 8.6 (L)   Albumin (g/dL)  Date Value  14/78/2956 3.8  09/26/2019 4.6   Phosphorus (mg/dL)  Date Value  21/30/8657 5.0 (H)   Sodium (mmol/L)  Date Value  10/04/2022 138  06/18/2020 134   Assessment: 77 y.o. male with PMH HTN, HLD, tobacco use, DM, PAD s/p bilateral iliac stents (09/05/2022) who presents with hypotension, weakness, and melena. Labs notable Hgb 9.8, now down to 8.0. GI has been consulted for EGD to be performed.  Goal of Therapy:  Electrolytes within normal limits  Plan:  --2 grams IV magnesium sulfate x 1 --Follow-up electrolytes with AM labs tomorrow  William Ball 10/04/2022 8:55 AM

## 2022-10-04 NOTE — Progress Notes (Signed)
PROGRESS NOTE    William Ball  WUJ:[id number] DOB: [birth year] DOA: 09/29/2022 PCP: Luciana Axe, NP    Assessment & Plan:   Principal Problem:   Upper GI bleed Active Problems:   Hyperlipidemia   BPH (benign prostatic hyperplasia)   Personal history of tobacco use, presenting hazards to health   Hypertension associated with diabetes (HCC)   PVD (peripheral vascular disease) (HCC)   Hyponatremia   Overweight (BMI 25.0-29.9)   AKI (acute kidney injury) (HCC)   Leukocytosis   Cellulitis of left groin   Deep venous thrombosis (DVT) of right peroneal vein (HCC)   Essential hypertension   Insulin dependent type 2 diabetes mellitus (HCC)   Right ankle pain   Cellulitis of drainage site, post-operative   Ulcer of esophagus without bleeding  Assessment and Plan: Upper GI bleed: s/p EGD which showed gastritis, duodenitis, & esophageal ulcer w/o any stigmata of recent bleeding. S/p video capsule study on 10/03/22. Eliquis was d/c. GI following and recs apprec.   Acute blood loss anemia: on anticoagulation. Likely secondary to gastritis, duodenitis & esophageal ulcer. S/p 1 unit of pRBCs transfused so far. Will continue to monitor H&H   Right ankle pain: XR shows soft tissue swelling but no fracture. RICE.    DM2: poorly controlled, HbA1c 8.0. Continue on glargine, SSI w/ accuchecks   Peripheral neuropathy: continue on home dose of gabapentin    HTN: continue on coreg.    DVT: of right peroneal vein. Hx of PAD s/p bilateral femoral endarterectomy with bilateral iliac stent placement 09/10/22. Continue on aspirin, plavix. Eliquis was d/c. Vasc surg recs apprec    Sciatica nerve pain of RLE: completed prednisone taper    Cellulitis of left groin: w/ possible small groin hematoma. Continue on IV ancef x 7 days    AKI: Cr is trending down again today    Overweight: BMI 25.3. Would benefit from some weight loss   Hyponatremia: resolved    BPH: holding home dose of terazosin  secondary to potential hypotension    HLD: continue on statin       DVT prophylaxis: SCDs Code Status: full Family Communication: discussed pt's care w/ pt's family at bedside  Disposition Plan: likely d/c back home   Level of care: Telemetry Cardiac  Status is: Inpatient Remains inpatient appropriate because: severity of illness, waiting on capsule endoscopy results    Consultants:  GI   Procedures:   Antimicrobials:   Subjective: Pt c/o fatigue   Objective: Vitals:   10/03/22 1625 10/03/22 2009 10/03/22 2347 10/04/22 0352  BP: (!) 134/58 (!) 150/54 (!) 110/51 (!) 139/54  Pulse: (!) 56 (!) 54 (!) 52 (!) 51  Resp: 16 17 15 18   Temp: 98.1 F (36.7 C) 98.2 F (36.8 C) 98.3 F (36.8 C) 98.2 F (36.8 C)  TempSrc:      SpO2: 100% 100% 98% 99%  Weight:      Height:        Intake/Output Summary (Last 24 hours) at 10/04/2022 0824 Last data filed at 10/04/2022 9562 Gross per 24 hour  Intake 560 ml  Output 1300 ml  Net -740 ml   Filed Weights   09/29/22 1454 09/30/22 1424  Weight: 81.2 kg 77.8 kg    Examination:  General exam: Appears calm and comfortable  Respiratory system: Clear to auscultation. Respiratory effort normal. Cardiovascular system: S1 & S2 +. No rubs, gallops or clicks.  Gastrointestinal system: Abdomen is nondistended, soft and nontender.  Normal bowel  sounds heard. Central nervous system: Alert and oriented. Moves all extremities  Psychiatry: Judgement and insight appear normal. Mood & affect appropriate.     Data Reviewed: I have personally reviewed following labs and imaging studies  CBC: Recent Labs  Lab 09/29/22 1457 09/30/22 0450 10/01/22 0541 10/02/22 0540 10/02/22 2007 10/03/22 0506 10/04/22 0558  WBC 17.3* 16.0* 10.6* 7.8  --  7.0 6.5  NEUTROABS 10.1*  --   --   --   --   --   --   HGB 9.8* 8.0* 7.9* 7.1* 9.0* 8.3* 8.5*  HCT 29.5* 24.3* 23.8* 22.0* 26.7* 24.8* 25.1*  MCV 91.3 91.0 91.5 92.1  --  88.3 89.0  PLT 414* 338  329 296  --  284 260   Basic Metabolic Panel: Recent Labs  Lab 09/30/22 0450 10/01/22 0541 10/02/22 0540 10/03/22 0506 10/04/22 0558  NA 132* 135 137 136 138  K 4.7 4.6 4.3 4.2 4.3  CL 104 105 108 107 109  CO2 19* 22 22 22 23   GLUCOSE 185* 169* 82 70 54*  BUN 105* 79* 55* 34* 20  CREATININE 1.67* 1.56* 1.34* 1.30* 1.26*  CALCIUM 8.8* 9.2 8.8* 8.6* 8.6*  MG 2.9* 2.5* 2.1 1.8 1.7  PHOS 5.0*  --   --   --   --    GFR: Estimated Creatinine Clearance: 49.1 mL/min (A) (by C-G formula based on SCr of 1.26 mg/dL (H)). Liver Function Tests: Recent Labs  Lab 09/29/22 1827  AST 46*  ALT 97*  ALKPHOS 91  BILITOT 0.7  PROT 7.1  ALBUMIN 3.8   No results for input(s): "LIPASE", "AMYLASE" in the last 168 hours. No results for input(s): "AMMONIA" in the last 168 hours. Coagulation Profile: Recent Labs  Lab 09/29/22 1827  INR 1.3*   Cardiac Enzymes: No results for input(s): "CKTOTAL", "CKMB", "CKMBINDEX", "TROPONINI" in the last 168 hours. BNP (last 3 results) No results for input(s): "PROBNP" in the last 8760 hours. HbA1C: No results for input(s): "HGBA1C" in the last 72 hours. CBG: Recent Labs  Lab 10/02/22 2012 10/03/22 0759 10/03/22 1214 10/03/22 1643 10/03/22 2121  GLUCAP 80 76 158* 83 105*   Lipid Profile: No results for input(s): "CHOL", "HDL", "LDLCALC", "TRIG", "CHOLHDL", "LDLDIRECT" in the last 72 hours. Thyroid Function Tests: No results for input(s): "TSH", "T4TOTAL", "FREET4", "T3FREE", "THYROIDAB" in the last 72 hours. Anemia Panel: No results for input(s): "VITAMINB12", "FOLATE", "FERRITIN", "TIBC", "IRON", "RETICCTPCT" in the last 72 hours. Sepsis Labs: Recent Labs  Lab 09/29/22 1827  LATICACIDVEN 1.2    Recent Results (from the past 240 hour(s))  Blood culture (routine x 2)     Status: None   Collection Time: 09/29/22  6:32 PM   Specimen: BLOOD  Result Value Ref Range Status   Specimen Description BLOOD LEFT ANTECUBITAL  Final   Special  Requests   Final    BOTTLES DRAWN AEROBIC AND ANAEROBIC Blood Culture adequate volume   Culture   Final    NO GROWTH 5 DAYS Performed at [facility], [address], [place], [place] [address]    Report Status 10/04/2022 FINAL  Final  Blood culture (routine x 2)     Status: None   Collection Time: 09/29/22  6:32 PM   Specimen: BLOOD  Result Value Ref Range Status   Specimen Description BLOOD BLOOD LEFT FOREARM  Final   Special Requests   Final    BOTTLES DRAWN AEROBIC AND ANAEROBIC Blood Culture results may not be optimal  due to an excessive volume of blood received in culture bottles   Culture   Final    NO GROWTH 5 DAYS Performed at [facility], [address] [place] [place], [place] [address]    Report Status 10/04/2022 FINAL  Final         Radiology Studies: No results found.      Scheduled Meds:  sodium chloride   Intravenous Once   aspirin EC  81 mg Oral Daily   atorvastatin  40 mg Oral QHS   carvedilol  3.125 mg Oral BID   clopidogrel  75 mg Oral Daily   folic acid  1 mg Oral Daily   gabapentin  100 mg Oral TID   insulin aspart  0-5 Units Subcutaneous QHS   insulin aspart  0-9 Units Subcutaneous TID WC   insulin glargine-yfgn  30 Units Subcutaneous Daily   multivitamin with minerals  1 tablet Oral Daily   pantoprazole  40 mg Oral BID   senna-docusate  2 tablet Oral BID   thiamine  100 mg Oral Daily   Continuous Infusions:   ceFAZolin (ANCEF) IV 2 g (10/04/22 9147)   magnesium sulfate bolus IVPB       LOS: 5 days    Time spent: 25 mins     Charise Killian, MD [facility] Pager [phone]  If 7PM-7AM, please contact night-coverage [email or web address] 10/04/2022, 8:24 AM

## 2022-10-05 DIAGNOSIS — D62 Acute posthemorrhagic anemia: Secondary | ICD-10-CM | POA: Diagnosis not present

## 2022-10-05 DIAGNOSIS — K922 Gastrointestinal hemorrhage, unspecified: Secondary | ICD-10-CM | POA: Diagnosis not present

## 2022-10-05 DIAGNOSIS — E1169 Type 2 diabetes mellitus with other specified complication: Secondary | ICD-10-CM | POA: Diagnosis not present

## 2022-10-05 LAB — BASIC METABOLIC PANEL WITH GFR
Anion gap: 7 (ref 5–15)
BUN: 14 mg/dL (ref 8–23)
CO2: 23 mmol/L (ref 22–32)
Calcium: 8.3 mg/dL — ABNORMAL LOW (ref 8.9–10.3)
Chloride: 107 mmol/L (ref 98–111)
Creatinine, Ser: 1.17 mg/dL (ref 0.61–1.24)
GFR, Estimated: >60 mL/min
Glucose, Bld: 78 mg/dL (ref 70–99)
Potassium: 4.1 mmol/L (ref 3.5–5.1)
Sodium: 137 mmol/L (ref 135–145)

## 2022-10-05 LAB — GLUCOSE, CAPILLARY
Glucose-Capillary: 71 mg/dL (ref 70–99)
Glucose-Capillary: 117 mg/dL — ABNORMAL HIGH (ref 70–99)
Glucose-Capillary: 133 mg/dL — ABNORMAL HIGH (ref 70–99)
Glucose-Capillary: 133 mg/dL — ABNORMAL HIGH (ref 70–99)

## 2022-10-05 LAB — CBC
HCT: 23.6 % — ABNORMAL LOW (ref 39.0–52.0)
Hemoglobin: 7.9 g/dL — ABNORMAL LOW (ref 13.0–17.0)
MCH: 29.7 pg (ref 26.0–34.0)
MCHC: 33.5 g/dL (ref 30.0–36.0)
MCV: 88.7 fL (ref 80.0–100.0)
Platelets: 229 K/uL (ref 150–400)
RBC: 2.66 MIL/uL — ABNORMAL LOW (ref 4.22–5.81)
RDW: 13.9 % (ref 11.5–15.5)
WBC: 6.6 K/uL (ref 4.0–10.5)
nRBC: 0 % (ref 0.0–0.2)

## 2022-10-05 LAB — MAGNESIUM: Magnesium: 1.9 mg/dL (ref 1.7–2.4)

## 2022-10-05 NOTE — Consult Note (Signed)
PHARMACY CONSULT NOTE  Pharmacy Consult for Electrolyte Monitoring and Replacement   Recent Labs: Potassium (mmol/L)  Date Value  10/05/2022 4.1   Magnesium (mg/dL)  Date Value  16/02/9603 1.9   Calcium (mg/dL)  Date Value  54/01/8118 8.3 (L)   Albumin (g/dL)  Date Value  14/78/2956 3.8  09/26/2019 4.6   Phosphorus (mg/dL)  Date Value  21/30/8657 5.0 (H)   Sodium (mmol/L)  Date Value  10/05/2022 137  06/18/2020 134   Assessment: 77 y.o. male with PMH HTN, HLD, tobacco use, DM, PAD s/p bilateral iliac stents (09/05/2022) who presents with hypotension, weakness, and melena. Labs notable Hgb 9.8, now down to 8.0. GI has been consulted for EGD to be performed.  Goal of Therapy:  Electrolytes within normal limits  Plan:  --No electrolyte replacement at this time --Follow-up electrolytes with AM labs tomorrow  Carely Nappier A 10/05/2022 8:41 AM

## 2022-10-05 NOTE — Progress Notes (Signed)
PROGRESS NOTE    William Ball  ZOX:[id number] DOB: July 30, 1945 DOA: 09/29/2022 PCP: Luciana Axe, NP    Assessment & Plan:   Principal Problem:   Upper GI bleed Active Problems:   Hyperlipidemia   BPH (benign prostatic hyperplasia)   Personal history of tobacco use, presenting hazards to health   Hypertension associated with diabetes (HCC)   PVD (peripheral vascular disease) (HCC)   Hyponatremia   Overweight (BMI 25.0-29.9)   AKI (acute kidney injury) (HCC)   Leukocytosis   Cellulitis of left groin   Deep venous thrombosis (DVT) of right peroneal vein (HCC)   Essential hypertension   Insulin dependent type 2 diabetes mellitus (HCC)   Right ankle pain   Cellulitis of drainage site, post-operative   Ulcer of esophagus without bleeding  Assessment and Plan: Upper GI bleed: s/p EGD which showed gastritis, duodenitis, & esophageal ulcer w/o any stigmata of recent bleeding. S/p video capsule study on 10/03/22 and results are pending still. Waiting to hear back from GI. Eliquis was d/c. GI recs apprec   Acute blood loss anemia: on anticoagulation. Likely secondary to gastritis, duodenitis & esophageal ulcer. S/p 1 unit of pRBCs transfused so far. H&H are labile    Right ankle pain: XR shows soft tissue swelling but no fracture. RICE.    DM2: HbA1c 8.0, poorly controlled. Continue on glargine, SSI w/ accuchecks   Peripheral neuropathy: continue on home dose of gabapentin    HTN: continue on coreg    DVT: of right peroneal vein. Hx of PAD s/p bilateral femoral endarterectomy with bilateral iliac stent placement 09/10/22. Continue on plavix, aspirin. Eliquis was d/c. Vasc surg recs apprec   Sciatica nerve pain of RLE: completed prednisone taper    Cellulitis of left groin: w/ possible small groin hematoma. Continue on IV ancef x 7 days   AKI: Cr is trending down again today    Overweight: BMI 25.3. Would benefit from weight loss    Hyponatremia: resolved    BPH: holding  home dose of terazosin secondary to potential hypotension    HLD: continue on statin        DVT prophylaxis: SCDs Code Status: full Family Communication: discussed pt's care w/ pt's family at bedside  Disposition Plan: likely d/c back home   Level of care: Telemetry Cardiac  Status is: Inpatient Remains inpatient appropriate because: waiting on capsule study results, waiting to hear back from GI    Consultants:  GI   Procedures:   Antimicrobials:   Subjective: Pt c/o malaise   Objective: Vitals:   10/04/22 2123 10/04/22 2345 10/05/22 0337 10/05/22 0825  BP:  (!) 146/55 (!) 137/51 (!) 153/59  Pulse:  (!) 52 (!) 55 (!) 44  Resp: 17 16 18 16   Temp:  98.8 F (37.1 C) 98.4 F (36.9 C) 98.2 F (36.8 C)  TempSrc:      SpO2:  96% 100% 99%  Weight:      Height:        Intake/Output Summary (Last 24 hours) at 10/05/2022 0828 Last data filed at 10/04/2022 2345 Gross per 24 hour  Intake 1400 ml  Output 1200 ml  Net 200 ml   Filed Weights   09/29/22 1454 09/30/22 1424  Weight: 81.2 kg 77.8 kg    Examination:  General exam: Appears comfortable  Respiratory system: clear breath sounds b/l  Cardiovascular system: S1/S2+. No rubs or clicks  Gastrointestinal system: Abd is soft, NT, ND & hypoactive bowel sounds  Central  nervous system: Alert and oriented. Moves all extremities  Psychiatry: judgement and insight appears poor. Flat mood and affect    Data Reviewed: I have personally reviewed following labs and imaging studies  CBC: Recent Labs  Lab 09/29/22 1457 09/30/22 0450 10/01/22 0541 10/02/22 0540 10/02/22 2007 10/03/22 0506 10/04/22 0558 10/05/22 0542  WBC 17.3*   < > 10.6* 7.8  --  7.0 6.5 6.6  NEUTROABS 10.1*  --   --   --   --   --   --   --   HGB 9.8*   < > 7.9* 7.1* 9.0* 8.3* 8.5* 7.9*  HCT 29.5*   < > 23.8* 22.0* 26.7* 24.8* 25.1* 23.6*  MCV 91.3   < > 91.5 92.1  --  88.3 89.0 88.7  PLT 414*   < > 329 296  --  284 260 229   < > = values in  this interval not displayed.   Basic Metabolic Panel: Recent Labs  Lab 09/30/22 0450 10/01/22 0541 10/02/22 0540 10/03/22 0506 10/04/22 0558 10/05/22 0542  NA 132* 135 137 136 138 137  K 4.7 4.6 4.3 4.2 4.3 4.1  CL 104 105 108 107 109 107  CO2 19* 22 22 22 23 23   GLUCOSE 185* 169* 82 70 54* 78  BUN 105* 79* 55* 34* 20 14  CREATININE 1.67* 1.56* 1.34* 1.30* 1.26* 1.17  CALCIUM 8.8* 9.2 8.8* 8.6* 8.6* 8.3*  MG 2.9* 2.5* 2.1 1.8 1.7 1.9  PHOS 5.0*  --   --   --   --   --    GFR: Estimated Creatinine Clearance: 52.9 mL/min (by C-G formula based on SCr of 1.17 mg/dL). Liver Function Tests: Recent Labs  Lab 09/29/22 1827  AST 46*  ALT 97*  ALKPHOS 91  BILITOT 0.7  PROT 7.1  ALBUMIN 3.8   No results for input(s): "LIPASE", "AMYLASE" in the last 168 hours. No results for input(s): "AMMONIA" in the last 168 hours. Coagulation Profile: Recent Labs  Lab 09/29/22 1827  INR 1.3*   Cardiac Enzymes: No results for input(s): "CKTOTAL", "CKMB", "CKMBINDEX", "TROPONINI" in the last 168 hours. BNP (last 3 results) No results for input(s): "PROBNP" in the last 8760 hours. HbA1C: No results for input(s): "HGBA1C" in the last 72 hours. CBG: Recent Labs  Lab 10/04/22 1047 10/04/22 1227 10/04/22 1713 10/04/22 2113 10/05/22 0752  GLUCAP 187* 119* 120* 170* 71   Lipid Profile: No results for input(s): "CHOL", "HDL", "LDLCALC", "TRIG", "CHOLHDL", "LDLDIRECT" in the last 72 hours. Thyroid Function Tests: No results for input(s): "TSH", "T4TOTAL", "FREET4", "T3FREE", "THYROIDAB" in the last 72 hours. Anemia Panel: No results for input(s): "VITAMINB12", "FOLATE", "FERRITIN", "TIBC", "IRON", "RETICCTPCT" in the last 72 hours. Sepsis Labs: Recent Labs  Lab 09/29/22 1827  LATICACIDVEN 1.2    Recent Results (from the past 240 hour(s))  Blood culture (routine x 2)     Status: None   Collection Time: 09/29/22  6:32 PM   Specimen: BLOOD  Result Value Ref Range Status    Specimen Description BLOOD LEFT ANTECUBITAL  Final   Special Requests   Final    BOTTLES DRAWN AEROBIC AND ANAEROBIC Blood Culture adequate volume   Culture   Final    NO GROWTH 5 DAYS Performed at [facility], [address] [place], [place] [address]    Report Status 10/04/2022 FINAL  Final  Blood culture (routine x 2)     Status: None   Collection Time: 09/29/22  6:32 PM   Specimen: BLOOD  Result Value Ref Range Status   Specimen Description BLOOD BLOOD LEFT FOREARM  Final   Special Requests   Final    BOTTLES DRAWN AEROBIC AND ANAEROBIC Blood Culture results may not be optimal due to an excessive volume of blood received in culture bottles   Culture   Final    NO GROWTH 5 DAYS Performed at [facility], [address] [place], [place] [address]    Report Status 10/04/2022 FINAL  Final         Radiology Studies: No results found.      Scheduled Meds:  sodium chloride   Intravenous Once   aspirin EC  81 mg Oral Daily   atorvastatin  40 mg Oral QHS   carvedilol  3.125 mg Oral BID   clopidogrel  75 mg Oral Daily   folic acid  1 mg Oral Daily   gabapentin  100 mg Oral TID   insulin aspart  0-5 Units Subcutaneous QHS   insulin aspart  0-9 Units Subcutaneous TID WC   insulin glargine-yfgn  30 Units Subcutaneous Daily   multivitamin with minerals  1 tablet Oral Daily   pantoprazole  40 mg Oral BID   senna-docusate  2 tablet Oral BID   thiamine  100 mg Oral Daily   Continuous Infusions:   ceFAZolin (ANCEF) IV 2 g (10/05/22 0601)     LOS: 6 days    Time spent: 25 mins     Charise Killian, MD [facility] Pager [phone]  If 7PM-7AM, please contact night-coverage [email or web address] 10/05/2022, 8:28 AM

## 2022-10-05 NOTE — Progress Notes (Signed)
Mobility Specialist - Progress Note    10/05/22 1316  Mobility  Activity Ambulated independently in hallway  Level of Assistance Modified independent, requires aide device or extra time  Assistive Device Front wheel walker  Distance Ambulated (ft) 160 ft  Range of Motion/Exercises Active  Activity Response Tolerated well  Mobility Referral Yes  $Mobility charge 1 Mobility  Mobility Specialist Start Time (ACUTE ONLY) 1300  Mobility Specialist Stop Time (ACUTE ONLY) 1316  Mobility Specialist Time Calculation (min) (ACUTE ONLY) 16 min   Pt resting EOB upon entry on RA. Pt endorses 2/10 right foot pain. Pt STS and ambulates to hallway around NS for 1 lap with RW ModI (for comfort). Pt returned to EOB and left with needs in reach. Pt family present at bedside.    William Ball Mobility Specialist 10/05/22, 1:19 PM

## 2022-10-06 ENCOUNTER — Encounter: Payer: Self-pay | Admitting: Gastroenterology

## 2022-10-06 DIAGNOSIS — K922 Gastrointestinal hemorrhage, unspecified: Secondary | ICD-10-CM | POA: Diagnosis not present

## 2022-10-06 LAB — BASIC METABOLIC PANEL WITH GFR
Anion gap: 6 (ref 5–15)
BUN: 20 mg/dL (ref 8–23)
CO2: 24 mmol/L (ref 22–32)
Calcium: 8.4 mg/dL — ABNORMAL LOW (ref 8.9–10.3)
Chloride: 107 mmol/L (ref 98–111)
Creatinine, Ser: 1.22 mg/dL (ref 0.61–1.24)
GFR, Estimated: >60 mL/min
Glucose, Bld: 86 mg/dL (ref 70–99)
Potassium: 4.1 mmol/L (ref 3.5–5.1)
Sodium: 137 mmol/L (ref 135–145)

## 2022-10-06 LAB — CBC
HCT: 24.7 % — ABNORMAL LOW (ref 39.0–52.0)
Hemoglobin: 8.2 g/dL — ABNORMAL LOW (ref 13.0–17.0)
MCH: 29.8 pg (ref 26.0–34.0)
MCHC: 33.2 g/dL (ref 30.0–36.0)
MCV: 89.8 fL (ref 80.0–100.0)
Platelets: 245 K/uL (ref 150–400)
RBC: 2.75 MIL/uL — ABNORMAL LOW (ref 4.22–5.81)
RDW: 13.6 % (ref 11.5–15.5)
WBC: 6.9 K/uL (ref 4.0–10.5)
nRBC: 0 % (ref 0.0–0.2)

## 2022-10-06 LAB — GLUCOSE, CAPILLARY
Glucose-Capillary: 77 mg/dL (ref 70–99)
Glucose-Capillary: 152 mg/dL — ABNORMAL HIGH (ref 70–99)
Glucose-Capillary: 162 mg/dL — ABNORMAL HIGH (ref 70–99)
Glucose-Capillary: 154 mg/dL — ABNORMAL HIGH (ref 70–99)

## 2022-10-06 LAB — MAGNESIUM: Magnesium: 1.8 mg/dL (ref 1.7–2.4)

## 2022-10-06 MED ORDER — MAGNESIUM SULFATE 2 GM/50ML IV SOLN
2.0000 g | Freq: Once | INTRAVENOUS | Status: AC
  Administered 2022-10-06: 2 g via INTRAVENOUS
  Filled 2022-10-06: qty 50

## 2022-10-06 NOTE — Progress Notes (Signed)
Physical Therapy Treatment Patient Details Name: William Ball MRN: [id number] DOB: [birth year] Today's Date: 10/06/2022   History of Present Illness Pt is a 77 y.o. male with history of hypertension, hyperlipidemia, tobacco abuse, diabetes mellitus 2 non-insulin-dependent, constipation, PAD.  Patient is now status post bilateral femoral endarterectomies with bilateral iliac stents placed on 09/10/2026.  He was discharged on 09/15/2022 to home doing well.  He presents to [facility] emergency department on 09/29/2022 with a chief complaint of hypotension and weakness, tarry stools.    PT Comments    Pt seen for PT tx with pt agreeable. Pt is able to ambulate increased distances with RW & supervision with cuing for upright posture & need to ambulate within base of AD but poor return demo. Pt attempts gait without AD but requires heavier min assist as pt limited by R ankle pain & decreased weight shift to RLE during stance phase. Pt is able to negotiate 6 steps with B rails & step to pattern with supervision. Pt engaged in STS activity for BLE strengthening & endurance training. Continue to recommend PT services to progress gait with LRAD, for strengthening, balance, & activity tolerance.    Recommendations for follow up therapy are one component of a multi-disciplinary discharge planning process, led by the attending physician.  Recommendations may be updated based on patient status, additional functional criteria and insurance authorization.  Follow Up Recommendations  Can patient physically be transported by private vehicle: Yes    Assistance Recommended at Discharge PRN  Patient can return home with the following A little help with bathing/dressing/bathroom;Assistance with cooking/housework;Assist for transportation;Help with stairs or ramp for entrance   Equipment Recommendations  None recommended by PT    Recommendations for Other Services       Precautions / Restrictions  Precautions Precautions: Fall Restrictions Weight Bearing Restrictions: No     Mobility  Bed Mobility               General bed mobility comments: not tested, pt received & left in recliner    Transfers Overall transfer level: Modified independent Equipment used: Rolling walker (2 wheels) Transfers: Sit to/from Stand Sit to Stand: Modified independent (Device/Increase time)           General transfer comment: STS from recliner with RW    Ambulation/Gait Ambulation/Gait assistance: Supervision, Min assist Gait Distance (Feet): 175 Feet (+ 35 ft) Assistive device: Rolling walker (2 wheels), None Gait Pattern/deviations: Decreased step length - right, Decreased step length - left, Decreased dorsiflexion - right, Decreased dorsiflexion - left, Trunk flexed Gait velocity: decreased     General Gait Details: Pt ambulates around nurses station with RW & supervision with trunk flexed, PT educates pt on need to ambulate within base of AD & upright posture but poor return demo. Pt reports he is primarily using RW for balance vs support. Pt then ambulated ~35 ft without AD & min assist with decreased weight shifting to RLE in stance phase 2/2 R ankle pain.   Stairs Stairs: Yes Stairs assistance: Supervision Stair Management: Two rails, Step to pattern Number of Stairs: 6 (6") General stair comments: Pt reports he has 5 steps with B rails or ramp to access house. Pt negotiates 6 steps with B rails & supervision, no LOB.   Wheelchair Mobility    Modified Rankin (Stroke Patients Only)       Balance     Sitting balance-Leahy Scale: Good     Standing balance support: Bilateral upper extremity supported, During  functional activity Standing balance-Leahy Scale: Good                              Cognition Arousal/Alertness: Awake/alert Behavior During Therapy: WFL for tasks assessed/performed Overall Cognitive Status: Within Functional Limits for tasks  assessed                                 General Comments: Very pleasant man, agreeable to participate        Exercises Other Exercises Other Exercises: Pt performed 2 sets x 5 reps STS without BUE support with min assist<>CGA for first set, close supervision for 2nd set. Pt demonstrates wide BOS but good anterior weight shift. Activity focused on BLE strengthening & endurance training & pt visibly fatigued after activity.    General Comments General comments (skin integrity, edema, etc.): max HR 94 bpm      Pertinent Vitals/Pain Pain Assessment Pain Assessment: Faces Faces Pain Scale: Hurts even more Pain Location: R ankle with weight bearing Pain Descriptors / Indicators: Guarding, Discomfort Pain Intervention(s): Monitored during session, Limited activity within patient's tolerance    Home Living                          Prior Function            PT Goals (current goals can now be found in the care plan section) Acute Rehab PT Goals Patient Stated Goal: to go home PT Goal Formulation: With patient Time For Goal Achievement: 10/15/22 Potential to Achieve Goals: Good Progress towards PT goals: Progressing toward goals    Frequency    Min 2X/week      PT Plan Current plan remains appropriate    Co-evaluation              AM-PAC PT "6 Clicks" Mobility   Outcome Measure  Help needed turning from your back to your side while in a flat bed without using bedrails?: None Help needed moving from lying on your back to sitting on the side of a flat bed without using bedrails?: None Help needed moving to and from a bed to a chair (including a wheelchair)?: A Little Help needed standing up from a chair using your arms (e.g., wheelchair or bedside chair)?: None Help needed to walk in hospital room?: A Little Help needed climbing 3-5 steps with a railing? : A Little 6 Click Score: 21    End of Session   Activity Tolerance: Patient  tolerated treatment well Patient left: in chair;with chair alarm set;with call bell/phone within reach Nurse Communication: Mobility status PT Visit Diagnosis: Other abnormalities of gait and mobility (R26.89);Muscle weakness (generalized) (M62.81);Pain;Unsteadiness on feet (R26.81) Pain - Right/Left: Right Pain - part of body: Ankle and joints of foot     Time: 1610-9604 PT Time Calculation (min) (ACUTE ONLY): 19 min  Charges:  $Therapeutic Activity: 8-22 mins                     Aleda Grana, PT, DPT 10/06/22, 4:35 PM   Sandi Mariscal 10/06/2022, 4:34 PM

## 2022-10-06 NOTE — Care Management Important Message (Signed)
Important Message  Patient Details  Name: William Ball MRN: [id number] Date of Birth: 16-Nov-1945   Medicare Important Message Given:  Yes     Johnell Comings 10/06/2022, 11:34 AM

## 2022-10-06 NOTE — Progress Notes (Signed)
PROGRESS NOTE    William Ball  ZOX:[id number] DOB: 1945/11/18 DOA: 09/29/2022 PCP: Luciana Axe, NP    Assessment & Plan:   Principal Problem:   Upper GI bleed Active Problems:   Hyperlipidemia   BPH (benign prostatic hyperplasia)   Personal history of tobacco use, presenting hazards to health   Hypertension associated with diabetes (HCC)   PVD (peripheral vascular disease) (HCC)   Hyponatremia   Overweight (BMI 25.0-29.9)   AKI (acute kidney injury) (HCC)   Leukocytosis   Cellulitis of left groin   Deep venous thrombosis (DVT) of right peroneal vein (HCC)   Essential hypertension   Insulin dependent type 2 diabetes mellitus (HCC)   Right ankle pain   Cellulitis of drainage site, post-operative   Ulcer of esophagus without bleeding  Assessment and Plan: Upper GI bleed: s/p EGD which showed gastritis, duodenitis, & esophageal ulcer w/o any stigmata of recent bleeding. 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 No balloon enteroscopy available here or [facility] & if clinically having signs of bleeding will need to be transferred to [facility] or [facility]. If no evidence of active bleeding then can go home with clear instructions that if he bleeds to go to [facility] or [facility] ER since we do not have baloon enteroscopy either here or at [facility] . If has no bleeding to follow up with Dr Servando Snare as an outpatient. Eliquis was d/c. GI recs apprec   Acute blood loss anemia: on anticoagulation. Likely secondary to gastritis, duodenitis & esophageal ulcer. S/p 1 unit of pRBCs transfused so far. H&H are labile    Right ankle pain: XR shows soft tissue swelling but no fracture. RICE.    DM2: HbA1c 8.0, poorly controlled. Continue on glargine, SSI w/ accuchecks   Peripheral neuropathy: continue on home dose of gabapentin    HTN: continue on coreg    DVT: of right peroneal vein. Hx of PAD s/p bilateral  femoral endarterectomy with bilateral iliac stent placement 09/10/22. Continue on aspirin, plavix & d/c eliquis as per vasc surg   Sciatica nerve pain of RLE: completed prednisone taper    Cellulitis of left groin: w/ possible small groin hematoma. Continue on IV ancef x 7 days total    AKI: Cr is labile. Avoid nephrotoxic meds    Overweight: BMI 25.3. Would benefit from weight loss    Hyponatremia: resolved    BPH: can restart home dose of terazosin tomorrow if BP remains stable    HLD: continue on statin         DVT prophylaxis: SCDs Code Status: full Family Communication: discussed pt's care w/ pt's family at bedside  Disposition Plan: likely d/c back home   Level of care: Telemetry Cardiac  Status is: Inpatient Remains inpatient appropriate because: positive capsule study, will likely need transfer    Consultants:  GI   Procedures:   Antimicrobials:   Subjective: Pt c/o fatigue   Objective: Vitals:   10/05/22 1153 10/05/22 1654 10/05/22 1938 10/06/22 0624  BP: (!) 150/50 (!) 146/46 (!) 143/46 (!) 133/56  Pulse: (!) 54 (!) 50    Resp: 16 16 15 15   Temp: 98.4 F (36.9 C) 98.4 F (36.9 C) 98.5 F (36.9 C) 98.7 F (37.1 C)  TempSrc:   Oral Oral  SpO2: 90% 100% 99% 100%  Weight:      Height:  Intake/Output Summary (Last 24 hours) at 10/06/2022 0842 Last data filed at 10/06/2022 0028 Gross per 24 hour  Intake --  Output 1250 ml  Net -1250 ml   Filed Weights   09/29/22 1454 09/30/22 1424  Weight: 81.2 kg 77.8 kg    Examination:  General exam: Appears calm & comfortable  Respiratory system: clear breath sounds b/l  Cardiovascular system: S1 & S2+. No rubs or clicks  Gastrointestinal system: Abd is soft, NT, ND & normal bowel sounds  Central nervous system: alert and oriented. Moves all extremities  Psychiatry: judgement and insight appears poor. Flat mood and affect     Data Reviewed: I have personally reviewed following labs and imaging  studies  CBC: Recent Labs  Lab 09/29/22 1457 09/30/22 0450 10/02/22 0540 10/02/22 2007 10/03/22 0506 10/04/22 0558 10/05/22 0542 10/06/22 0528  WBC 17.3*   < > 7.8  --  7.0 6.5 6.6 6.9  NEUTROABS 10.1*  --   --   --   --   --   --   --   HGB 9.8*   < > 7.1* 9.0* 8.3* 8.5* 7.9* 8.2*  HCT 29.5*   < > 22.0* 26.7* 24.8* 25.1* 23.6* 24.7*  MCV 91.3   < > 92.1  --  88.3 89.0 88.7 89.8  PLT 414*   < > 296  --  284 260 229 245   < > = values in this interval not displayed.   Basic Metabolic Panel: Recent Labs  Lab 09/30/22 0450 10/01/22 0541 10/02/22 0540 10/03/22 0506 10/04/22 0558 10/05/22 0542 10/06/22 0528  NA 132*   < > 137 136 138 137 137  K 4.7   < > 4.3 4.2 4.3 4.1 4.1  CL 104   < > 108 107 109 107 107  CO2 19*   < > 22 22 23 23 24   GLUCOSE 185*   < > 82 70 54* 78 86  BUN 105*   < > 55* 34* 20 14 20   CREATININE 1.67*   < > 1.34* 1.30* 1.26* 1.17 1.22  CALCIUM 8.8*   < > 8.8* 8.6* 8.6* 8.3* 8.4*  MG 2.9*   < > 2.1 1.8 1.7 1.9 1.8  PHOS 5.0*  --   --   --   --   --   --    < > = values in this interval not displayed.   GFR: Estimated Creatinine Clearance: 50.7 mL/min (by C-G formula based on SCr of 1.22 mg/dL). Liver Function Tests: Recent Labs  Lab 09/29/22 1827  AST 46*  ALT 97*  ALKPHOS 91  BILITOT 0.7  PROT 7.1  ALBUMIN 3.8   No results for input(s): "LIPASE", "AMYLASE" in the last 168 hours. No results for input(s): "AMMONIA" in the last 168 hours. Coagulation Profile: Recent Labs  Lab 09/29/22 1827  INR 1.3*   Cardiac Enzymes: No results for input(s): "CKTOTAL", "CKMB", "CKMBINDEX", "TROPONINI" in the last 168 hours. BNP (last 3 results) No results for input(s): "PROBNP" in the last 8760 hours. HbA1C: No results for input(s): "HGBA1C" in the last 72 hours. CBG: Recent Labs  Lab 10/04/22 2113 10/05/22 0752 10/05/22 1210 10/05/22 1628 10/05/22 2029  GLUCAP 170* 71 117* 133* 133*   Lipid Profile: No results for input(s): "CHOL", "HDL",  "LDLCALC", "TRIG", "CHOLHDL", "LDLDIRECT" in the last 72 hours. Thyroid Function Tests: No results for input(s): "TSH", "T4TOTAL", "FREET4", "T3FREE", "THYROIDAB" in the last 72 hours. Anemia Panel: No results for input(s): "VITAMINB12", "FOLATE", "  FERRITIN", "TIBC", "IRON", "RETICCTPCT" in the last 72 hours. Sepsis Labs: Recent Labs  Lab 09/29/22 1827  LATICACIDVEN 1.2    Recent Results (from the past 240 hour(s))  Blood culture (routine x 2)     Status: None   Collection Time: 09/29/22  6:32 PM   Specimen: BLOOD  Result Value Ref Range Status   Specimen Description BLOOD LEFT ANTECUBITAL  Final   Special Requests   Final    BOTTLES DRAWN AEROBIC AND ANAEROBIC Blood Culture adequate volume   Culture   Final    NO GROWTH 5 DAYS Performed at [facility], [address] [place], [place] [address]    Report Status 10/04/2022 FINAL  Final  Blood culture (routine x 2)     Status: None   Collection Time: 09/29/22  6:32 PM   Specimen: BLOOD  Result Value Ref Range Status   Specimen Description BLOOD BLOOD LEFT FOREARM  Final   Special Requests   Final    BOTTLES DRAWN AEROBIC AND ANAEROBIC Blood Culture results may not be optimal due to an excessive volume of blood received in culture bottles   Culture   Final    NO GROWTH 5 DAYS Performed at [facility], [address], [place], [place] [address]    Report Status 10/04/2022 FINAL  Final         Radiology Studies: No results found.      Scheduled Meds:  sodium chloride   Intravenous Once   aspirin EC  81 mg Oral Daily   atorvastatin  40 mg Oral QHS   carvedilol  3.125 mg Oral BID   clopidogrel  75 mg Oral Daily   folic acid  1 mg Oral Daily   gabapentin  100 mg Oral TID   insulin aspart  0-5 Units Subcutaneous QHS   insulin aspart  0-9 Units Subcutaneous TID WC   insulin glargine-yfgn  30 Units Subcutaneous Daily   multivitamin with minerals  1 tablet Oral Daily   pantoprazole  40 mg Oral  BID   senna-docusate  2 tablet Oral BID   thiamine  100 mg Oral Daily   Continuous Infusions:  magnesium sulfate bolus IVPB       LOS: 7 days    Time spent: 25 mins     Charise Killian, MD [facility] Pager [phone]  If 7PM-7AM, please contact night-coverage [email or web address] 10/06/2022, 8:42 AM

## 2022-10-06 NOTE — Discharge Summary (Addendum)
Physician Discharge Summary  MOSIE ANGUS ZOX:[id number] DOB: 29-May-1945 DOA: 09/29/2022  PCP: Luciana Axe, NP  Admit date: 09/29/2022 Discharge date: 10/16/22  Admitted From: home  Disposition:  transfer to [facility]   Recommendations for Outpatient Follow-up:  Follow up with PCP in 1-2 weeks Call [facility] GI clinic to make an appointment ASAP at [phone].  Home Health: Equipment/Devices:  Discharge Condition: stable  CODE STATUS: full  Diet recommendation: Heart Healthy / Carb Modified   Brief/Interim Summary: HPI was taken from Dr. Sedalia Muta:  Mr. Olon Russ is a 77 year old male with history of hypertension, hyperlipidemia, tobacco use, non-insulin-dependent diabetes mellitus, constipation, PAD status post bilateral femoral enterectomy and bilateral iliac stents placed on 09/05/2022, history of occlusive right peroneal vein DVT, who presents emergency department for chief concerns of hypotension and weakness.   Vitals in the ED showed temperature of 97.7, respiration rate of 16, heart rate of 71, blood pressure 120/62, SpO2 98% on room air.   Serum sodium is 129, potassium 5.0, chloride 98, bicarb 20, BUN of 110, serum creatinine 1.91, EGFR 36, nonfasting blood glucose 170, WBC 17.3, hemoglobin 9.8, platelets of 414.   ED treatment: Protonix 40 mg IV, gabapentin 100 mg p.o., ceftriaxone 2 g IV, sodium chloride 500 mL bolus, vancomycin ------------------------------ At bedside, he was able to tell me his name, age, current calendar year, current locatin. He reports weakness and hypotension that he noticed today.  He reports his blood pressure normally runs in the 130s and today is was in the low 120s.  He endorses weakness in the last 2 days with poor p.o. intake in the last 2 days.   He reports right ankle, started to hurt since discharge from hsoptial. He has been trying epsom salt which did help.   He reports three days of melena stool. He denies taking iron tablets and nsaids at home.  He is one aspirin, plavix, and eliquis at home  As per Dr. Stephania Fragmin: 69 year old with history of HTN, HLD, tobacco use, non-insulin-dependent DM2, constipation, PAD status post bilateral femoral endarterectomy with bilateral iliac stent placement on 09/05/2022, occlusive right lower extremity DVT comes to the ED with weakness and hypotension.  Patient was noted to have hemoglobin of 9.8, started on PPI, given antibiotics in the ED.  Patient was diagnosed with acute on chronic anemia.  GI team consulted.  EGD 5/28 showed gastritis/duodenitis and esophageal ulcer without any stigmata of recent bleeding.  GI recommended PPI.  Seen by vascular team and for now discontinue Eliquis as patient will not need DVT treatment but will need to continue aspirin and Plavix.  Hemoglobin slowly drifted down therefore received 1 unit of PRBC.  Discussed with GI who placed video capsule.    As per Dr. 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 1 area of active bleeding seen in mid small bowel. [facility] and [facility] do not have balloon enteroscopy so pt will need to be transferred to [facility] for this procedure. Pt has been accepted to [facility] for this procedure. Pt will be transferred back to [facility] likely sometime after his procedure. Please see previous progress/consult notes for more information.     As per Dr. Mayford Knife 6/12-6/13/24: Talked w/ Dr. Reginold Agent hospitalist at [facility] who spoke w/ their GI specialist who stated they would not do the proceed if pt is not actively bleeding and have black stools. Pt only received 1 unit pRBCs this entire admission. Pt was  told to return to ER if bleeding reoccurs, possibly [facility] ER if possible and if not then return to [facility] ER. Pt and pt's wife verbalized their understanding.   Discharge Diagnoses:  Principal Problem:   Upper GI bleed Active Problems:   Hyperlipidemia   BPH (benign prostatic hyperplasia)   Personal history of  tobacco use, presenting hazards to health   Hypertension associated with diabetes (HCC)   PVD (peripheral vascular disease) (HCC)   Hyponatremia   Overweight (BMI 25.0-29.9)   AKI (acute kidney injury) (HCC)   Leukocytosis   Cellulitis of left groin   Deep venous thrombosis (DVT) of right peroneal vein (HCC)   Essential hypertension   Insulin dependent type 2 diabetes mellitus (HCC)   Right ankle pain   Cellulitis of drainage site, post-operative   Ulcer of esophagus without bleeding  Upper GI bleed: s/p EGD which showed gastritis, duodenitis, & esophageal ulcer w/o any stigmata of recent bleeding. 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 No balloon enteroscopy available here or [place] & if clinically having signs of bleeding will need to be transferred to [facility] or [facility]. Eliquis was d/c. Pt has been accepted to transfer to [facility] for balloon enteroscopy but see above recommendations/follow-up.   Acute blood loss anemia: on anticoagulation. Likely secondary to gastritis, duodenitis & esophageal ulcer. S/p 1 unit of pRBCs transfused so far. H&H are labile. Will transfuse if Hb <7.0     Right ankle pain: XR shows soft tissue swelling but no fracture. RICE.    DM2: HbA2c 8.0, poorly controlled. Continue on glargine, SSI w/ accuchecks   Peripheral neuropathy: continue on home dose of gabapentin     HTN: continue on coreg   DVT: of right peroneal vein. Hx of PAD s/p bilateral femoral endarterectomy with bilateral iliac stent placement 09/10/22. Continue on aspirin, plavix but eliquis was d/c as per vasc surg    Sciatica nerve pain of RLE: completed prednisone taper    Cellulitis of left groin: w/ possible small groin hematoma. Completed ancef x 7 days and completed a second course of abxs as well    AKI: Cr is labile. Avoid nephrotoxic meds    Overweight: BMI 25.3. Would benefit from  weight loss    Hyponatremia: resolved    BPH: continue on terazosin   HLD: continue on statin            Discharge Instructions  Discharge Instructions     Diet - low sodium heart healthy   Complete by: As directed    Diet Carb Modified   Complete by: As directed    Discharge instructions   Complete by: As directed    F/u w/ PCP in 1-2 weeks. F/u w/ GI, Dr. Servando Snare, in 1-2 weeks.   Discharge instructions   Complete by: As directed    Call to make an appointment at [facility] GI clinic at [phone] as soon as possible. F/u w/ PCP in 1-2 weeks   Increase activity slowly   Complete by: As directed    Increase activity slowly   Complete by: As directed       Allergies as of 10/16/2022       Reactions   Amlodipine Itching        Medication List     STOP taking these medications    apixaban 5 MG Tabs tablet Commonly known as:  ELIQUIS       TAKE these medications    albuterol 108 (90 Base) MCG/ACT inhaler Commonly known as: VENTOLIN HFA Inhale 1-2 puffs into the lungs every 6 (six) hours as needed for wheezing or shortness of breath.   aspirin EC 81 MG tablet Take 81 mg by mouth daily.   atorvastatin 40 MG tablet Commonly known as: LIPITOR Take 40 mg by mouth daily.   carvedilol 6.25 MG tablet Commonly known as: COREG Take 3.125 mg by mouth 2 (two) times daily.   chlorthalidone 50 MG tablet Commonly known as: HYGROTON Take 1 tablet (50 mg total) by mouth daily. MUST BE SEEN IN CLINIC FOR FURTHER REFILLS ON MEDICATION   clopidogrel 75 MG tablet Commonly known as: PLAVIX Take 1 tablet (75 mg total) by mouth daily.   empagliflozin 10 MG Tabs tablet Commonly known as: JARDIANCE Take 10 mg by mouth daily.   Fish Oil 1000 MG Caps Take 1,000 mg by mouth daily.   folic acid 1 MG tablet Commonly known as: FOLVITE Take 1 tablet (1 mg total) by mouth daily.   gabapentin 100 MG capsule Commonly known as: NEURONTIN Take 100 mg by mouth 3 (three) times  daily.   glipiZIDE 10 MG 24 hr tablet Commonly known as: GLUCOTROL XL Take 20 mg by mouth daily.   insulin degludec 100 UNIT/ML FlexTouch Pen Commonly known as: TRESIBA Inject 30 Units into the skin at bedtime.   multivitamin with minerals Tabs tablet Take 1 tablet by mouth daily.   nicotine 21 mg/24hr patch Commonly known as: NICODERM CQ - dosed in mg/24 hours Place 1 patch (21 mg total) onto the skin daily as needed (nicotine craving).   oxyCODONE-acetaminophen 5-325 MG tablet Commonly known as: PERCOCET/ROXICET Take 1 tablet by mouth every 4 (four) hours as needed for severe pain.   polyethylene glycol 17 g packet Commonly known as: MIRALAX / GLYCOLAX Take 17 g by mouth 2 (two) times daily.   senna-docusate 8.6-50 MG tablet Commonly known as: Senokot-S Take 2 tablets by mouth 2 (two) times daily.   silver sulfADIAZINE 1 % cream Commonly known as: SILVADENE Apply topically 2 (two) times daily.   spironolactone 25 MG tablet Commonly known as: ALDACTONE Take 25 mg by mouth daily.   terazosin 2 MG capsule Commonly known as: HYTRIN Take 2 mg by mouth at bedtime.   thiamine 100 MG tablet Commonly known as: Vitamin B-1 Take 1 tablet (100 mg total) by mouth daily.   valsartan 320 MG tablet Commonly known as: DIOVAN Take 160 mg by mouth daily.        Allergies  Allergen Reactions   Amlodipine Itching    Consultations: GI  Vasc surg    Procedures/Studies: DG Ankle 2 Views Right  Result Date: 09/29/2022 CLINICAL DATA:  [id number] with ankle pain and purulent cellulitis. EXAM: RIGHT ANKLE - 2 VIEW COMPARISON:  None Available. FINDINGS: There is no evidence of fracture, dislocation, or joint effusion. There is no evidence of arthropathy or acute focal bone abnormality. Moderate posterior and plantar calcaneal enthesopathic spurring is noted. Mild anterior soft tissue fullness is noted with otherwise unremarkable soft tissues. IMPRESSION: 1. Mild anterior soft  tissue fullness without underlying osseous abnormality. 2. Moderate posterior and plantar calcaneal enthesopathic spurring. Electronically Signed   By: Almira Bar M.D.   On: 09/29/2022 22:13   [place] Lower Ext Art Left Ltd  Result Date: 09/29/2022 CLINICAL DATA:  Recent endovascular stent placement. Redness and drainage at incision site in  left groin. EXAM: LEFT LOWER EXTREMITY ARTERIAL DUPLEX SCAN TECHNIQUE: Gray-scale sonography as well as color Doppler and duplex ultrasound was performed to evaluate the left common femoral artery, superficial femoral artery and profunda femoral artery in the area of concern COMPARISON:  None Available. FINDINGS: Left lower Extremity No pseudoaneurysm seen. No visible AV fistula. Hypoechoic material is seen in the left groin adjacent to the vessels and in the left groin, likely small hematoma. Area in the left groin measures approximately 1 cm in maximum diameter. IMPRESSION: No evidence of pseudoaneurysm or AV fistula. Probable small left groin hematoma. Electronically Signed   By: Charlett Nose M.D.   On: 09/29/2022 21:06   US Venous Img Lower Bilateral  Result Date: 09/29/2022 CLINICAL DATA:  Leg swelling.  Follow-up DVT. EXAM: BILATERAL LOWER EXTREMITY VENOUS DOPPLER ULTRASOUND TECHNIQUE: Gray-scale sonography with graded compression, as well as color Doppler and duplex ultrasound were performed to evaluate the lower extremity deep venous systems from the level of the common femoral vein and including the common femoral, femoral, profunda femoral, popliteal and calf veins including the posterior tibial, peroneal and gastrocnemius veins when visible. The superficial great saphenous vein was also interrogated. Spectral Doppler was utilized to evaluate flow at rest and with distal augmentation maneuvers in the common femoral, femoral and popliteal veins. COMPARISON:  Right lower extremity duplex 09/22/2022. FINDINGS: RIGHT LOWER EXTREMITY Common Femoral Vein: No evidence  of thrombus. Normal compressibility, respiratory phasicity and response to augmentation. Saphenofemoral Junction: No evidence of thrombus. Normal compressibility and flow on color Doppler imaging. Profunda Femoral Vein: No evidence of thrombus. Normal compressibility and flow on color Doppler imaging. Femoral Vein: No evidence of thrombus. Normal compressibility, respiratory phasicity and response to augmentation. Popliteal Vein: No evidence of thrombus. Normal compressibility, respiratory phasicity and response to augmentation. Calf Veins: Again seen occlusion of the peroneal vein. Posterior tibial vein is patent. Superficial Great Saphenous Vein: No evidence of thrombus. Normal compressibility. Venous Reflux:  None. Other Findings:  None. LEFT LOWER EXTREMITY Common Femoral Vein: No evidence of thrombus. Normal compressibility, respiratory phasicity and response to augmentation. Saphenofemoral Junction: No evidence of thrombus. Normal compressibility and flow on color Doppler imaging. Profunda Femoral Vein: No evidence of thrombus. Normal compressibility and flow on color Doppler imaging. Femoral Vein: No evidence of thrombus. Normal compressibility, respiratory phasicity and response to augmentation. Popliteal Vein: No evidence of thrombus. Normal compressibility, respiratory phasicity and response to augmentation. Calf Veins: No evidence of thrombus. Normal compressibility and flow on color Doppler imaging. Superficial Great Saphenous Vein: No evidence of thrombus. Normal compressibility. Venous Reflux:  None. Other Findings:  None. IMPRESSION: 1. 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 2. No left lower extremity DVT. Electronically Signed   By: Narda Rutherford M.D.   On: 09/29/2022 19:28   DG Chest Port 1 View  Result Date: 09/29/2022 CLINICAL DATA:  Intractable nausea and vomiting. Weakness and hypotension. EXAM: PORTABLE CHEST 1 VIEW COMPARISON:  Chest  radiographs 01/27/2017, CT abdomen and pelvis 01/27/2017 FINDINGS: Moderate atherosclerotic calcifications within the aortic arch. A benign calcified granuloma again overlies the inferolateral left lung, also seen on prior CT. The lungs are clear. No pleural effusion or pneumothorax. No acute skeletal abnormality. IMPRESSION: No acute cardiopulmonary disease process. Electronically Signed   By: Neita Garnet M.D.   On: 09/29/2022 18:29   US Venous Img Lower Unilateral Right (DVT)  Result Date: 09/22/2022 CLINICAL DATA:  Right lower extremity edema and erythema. Status  post bilateral femoral surgical endarterectomy and iliac stent placement on 09/10/2022 EXAM: RIGHT LOWER EXTREMITY VENOUS DOPPLER ULTRASOUND TECHNIQUE: Gray-scale sonography with graded compression, as well as color Doppler and duplex ultrasound were performed to evaluate the lower extremity deep venous systems from the level of the common femoral vein and including the common femoral, femoral, profunda femoral, popliteal and calf veins including the posterior tibial, peroneal and gastrocnemius veins when visible. The superficial great saphenous vein was also interrogated. Spectral Doppler was utilized to evaluate flow at rest and with distal augmentation maneuvers in the common femoral, femoral and popliteal veins. COMPARISON:  None Available. FINDINGS: Contralateral Common Femoral Vein: Respiratory phasicity is normal and symmetric with the symptomatic side. No evidence of thrombus. Normal compressibility. Common Femoral Vein: No evidence of thrombus. Normal compressibility, respiratory phasicity and response to augmentation. Saphenofemoral Junction: No evidence of thrombus. Normal compressibility and flow on color Doppler imaging. Profunda Femoral Vein: No evidence of thrombus. Normal compressibility and flow on color Doppler imaging. Femoral Vein: No evidence of thrombus. Normal compressibility, respiratory phasicity and response to  augmentation. Popliteal Vein: No evidence of thrombus. Normal compressibility, respiratory phasicity and response to augmentation. Calf Veins: Posterior tibial vein demonstrates normal patency. There is a segment of visualized peroneal vein thrombus that appears to be occlusive. Superficial Great Saphenous Vein: No evidence of thrombus. Normal compressibility. Venous Reflux:  None. Other Findings: No evidence of superficial thrombophlebitis. Focal hypoechoic fluid collection in the right groin measures roughly 4 x 2 x 4 cm and is consistent with postoperative seroma or hematoma. IMPRESSION: 1. Segmental occlusive DVT in the right peroneal vein. 2. Focal fluid collection in the right groin measuring 4 x 2 x 4 cm is consistent with postoperative seroma or hematoma. Electronically Signed   By: Irish Lack M.D.   On: 09/22/2022 14:10   (Echo, Carotid, EGD, Colonoscopy, ERCP)    Subjective:   Discharge Exam: Vitals:   10/16/22 0520 10/16/22 0747  BP: (!) 147/53 (!) 159/49  Pulse: (!) 53   Resp: 18 16  Temp: 98.3 F (36.8 C) 98.5 F (36.9 C)  SpO2: 97% 98%   Vitals:   10/15/22 1652 10/15/22 2013 10/16/22 0520 10/16/22 0747  BP: (!) 169/49 (!) 169/55 (!) 147/53 (!) 159/49  Pulse: 71 67 (!) 53   Resp: 17 18 18 16   Temp: 98.3 F (36.8 C) 98.1 F (36.7 C) 98.3 F (36.8 C) 98.5 F (36.9 C)  TempSrc:  Oral  Oral  SpO2: 97% 97% 97% 98%  Weight:      Height:        General: Pt is alert, awake, not in acute distress Cardiovascular: S1/S2 +, no rubs, no gallops Respiratory: CTA bilaterally, no wheezing, no rhonchi Abdominal: Soft, NT, ND, bowel sounds + Extremities: no cyanosis    The results of significant diagnostics from this hospitalization (including imaging, microbiology, ancillary and laboratory) are listed below for reference.     Microbiology: Recent Results (from the past 240 hour(s))  SARS Coronavirus 2 by RT PCR (hospital order, performed in [facility] hospital lab)  *cepheid single result test* Anterior Nasal Swab     Status: None   Collection Time: 10/07/22  9:35 AM   Specimen: Anterior Nasal Swab  Result Value Ref Range Status   SARS Coronavirus 2 by RT PCR NEGATIVE NEGATIVE Final    Comment: (NOTE) SARS-CoV-2 target nucleic acids are NOT DETECTED.  The SARS-CoV-2 RNA is generally detectable in upper and lower respiratory specimens during the acute  phase of infection. The lowest concentration of SARS-CoV-2 viral copies this assay can detect is 250 copies / mL. A negative result does not preclude SARS-CoV-2 infection and should not be used as the sole basis for treatment or other patient management decisions.  A negative result may occur with improper specimen collection / handling, submission of specimen other than nasopharyngeal swab, presence of viral mutation(s) within the areas targeted by this assay, and inadequate number of viral copies (<250 copies / mL). A negative result must be combined with clinical observations, patient history, and epidemiological information.  Fact Sheet for Patients:   RoadLapTop.co.za  Fact Sheet for Healthcare Providers: http://kim-miller.com/  This test is not yet approved or  cleared by the [place] FDA and has been authorized for detection and/or diagnosis of SARS-CoV-2 by FDA under an Emergency Use Authorization (EUA).  This EUA will remain in effect (meaning this test can be used) for the duration of the COVID-19 declaration under Section 564(b)(1) of the Act, 21 U.S.C. section 360bbb-3(b)(1), unless the authorization is terminated or revoked sooner.  Performed at [facility], [address] [place], [place] [address]      Labs: BNP (last 3 results) No results for input(s): "BNP" in the last 8760 hours. Basic Metabolic Panel: No results for input(s): "NA", "K", "CL", "CO2", "GLUCOSE", "BUN", "CREATININE", "CALCIUM", "MG", "PHOS" in the  last 168 hours.  Liver Function Tests: No results for input(s): "AST", "ALT", "ALKPHOS", "BILITOT", "PROT", "ALBUMIN" in the last 168 hours.  No results for input(s): "LIPASE", "AMYLASE" in the last 168 hours. No results for input(s): "AMMONIA" in the last 168 hours. CBC: Recent Labs  Lab 10/14/22 0747 10/16/22 0445  WBC 6.1 6.1  HGB 7.8* 7.7*  HCT 24.2* 24.0*  MCV 92.0 93.0  PLT 249 263   Cardiac Enzymes: No results for input(s): "CKTOTAL", "CKMB", "CKMBINDEX", "TROPONINI" in the last 168 hours. BNP: Invalid input(s): "POCBNP" CBG: Recent Labs  Lab 10/15/22 1655 10/15/22 2014 10/15/22 2213 10/16/22 0747 10/16/22 1131  GLUCAP 172* 212* 277* 105* 130*   D-Dimer No results for input(s): "DDIMER" in the last 72 hours. Hgb A1c No results for input(s): "HGBA1C" in the last 72 hours. Lipid Profile No results for input(s): "CHOL", "HDL", "LDLCALC", "TRIG", "CHOLHDL", "LDLDIRECT" in the last 72 hours. Thyroid function studies No results for input(s): "TSH", "T4TOTAL", "T3FREE", "THYROIDAB" in the last 72 hours.  Invalid input(s): "FREET3" Anemia work up No results for input(s): "VITAMINB12", "FOLATE", "FERRITIN", "TIBC", "IRON", "RETICCTPCT" in the last 72 hours. Urinalysis    Component Value Date/Time   COLORURINE YELLOW (A) 01/27/2017 1016   APPEARANCEUR Clear 06/13/2019 1611   LABSPEC 1.009 01/27/2017 1016   PHURINE 5.0 01/27/2017 1016   GLUCOSEU Negative 06/13/2019 1611   HGBUR NEGATIVE 01/27/2017 1016   BILIRUBINUR Negative 06/13/2019 1611   KETONESUR NEGATIVE 01/27/2017 1016   PROTEINUR Negative 06/13/2019 1611   PROTEINUR NEGATIVE 01/27/2017 1016   NITRITE Negative 06/13/2019 1611   NITRITE NEGATIVE 01/27/2017 1016   LEUKOCYTESUR Negative 06/13/2019 1611   Sepsis Labs Recent Labs  Lab 10/14/22 0747 10/16/22 0445  WBC 6.1 6.1   Microbiology Recent Results (from the past 240 hour(s))  SARS Coronavirus 2 by RT PCR (hospital order, performed in [facility] hospital lab) *cepheid single result test* Anterior Nasal Swab     Status: None   Collection Time: 10/07/22  9:35 AM   Specimen: Anterior Nasal Swab  Result Value Ref Range Status   SARS Coronavirus 2 by RT  PCR NEGATIVE NEGATIVE Final    Comment: (NOTE) SARS-CoV-2 target nucleic acids are NOT DETECTED.  The SARS-CoV-2 RNA is generally detectable in upper and lower respiratory specimens during the acute phase of infection. The lowest concentration of SARS-CoV-2 viral copies this assay can detect is 250 copies / mL. A negative result does not preclude SARS-CoV-2 infection and should not be used as the sole basis for treatment or other patient management decisions.  A negative result may occur with improper specimen collection / handling, submission of specimen other than nasopharyngeal swab, presence of viral mutation(s) within the areas targeted by this assay, and inadequate number of viral copies (<250 copies / mL). A negative result must be combined with clinical observations, patient history, and epidemiological information.  Fact Sheet for Patients:   RoadLapTop.co.za  Fact Sheet for Healthcare Providers: http://kim-miller.com/  This test is not yet approved or  cleared by the [place] FDA and has been authorized for detection and/or diagnosis of SARS-CoV-2 by FDA under an Emergency Use Authorization (EUA).  This EUA will remain in effect (meaning this test can be used) for the duration of the COVID-19 declaration under Section 564(b)(1) of the Act, 21 U.S.C. section 360bbb-3(b)(1), unless the authorization is terminated or revoked sooner.  Performed at [facility], [address] [place], [place] [address]      Time coordinating discharge: Over 30 minutes  SIGNED:   Charise Killian, MD  [facility] 10/16/2022, 12:46 PM Pager   If 7PM-7AM, please contact  night-coverage [email or web address]

## 2022-10-06 NOTE — Consult Note (Signed)
PHARMACY CONSULT NOTE  Pharmacy Consult for Electrolyte Monitoring and Replacement   Recent Labs: Potassium (mmol/L)  Date Value  10/06/2022 4.1   Magnesium (mg/dL)  Date Value  13/12/6576 1.8   Calcium (mg/dL)  Date Value  46/96/2952 8.4 (L)   Albumin (g/dL)  Date Value  84/13/2440 3.8  09/26/2019 4.6   Phosphorus (mg/dL)  Date Value  03/01/2535 5.0 (H)   Sodium (mmol/L)  Date Value  10/06/2022 137  06/18/2020 134   Assessment: 77 y.o. male with PMH HTN, HLD, tobacco use, DM, PAD s/p bilateral iliac stents (09/05/2022) who presents with hypotension, weakness, and melena. Labs notable Hgb 9.8, now down to 8.0, seem to be stable.   Goal of Therapy:  Electrolytes within normal limits  Plan:  --Mg 2 g IV x 1. Overall, electrolytes and Scr have been stable. Pharmacy will sign off. Please re-consult pharmacy if needed.   Ronnald Ramp, PharmD, BCPS 10/06/2022 8:36 AM

## 2022-10-06 NOTE — Progress Notes (Signed)
Progress Note    10/06/2022 4:22 PM 3 Days Post-Op  Subjective:  [name] is a 77 year old male with a history of hypertension, hyperlipidemia, tobacco use, diabetes mellitus 2 non-insulin-dependent, PAD now status post bilateral femoral endarterectomies with bilateral iliac stent placement on 09/05/2022.  He is also down to have a right lower extremity DVT.   Patient presented to [facility] emergency department with generalized weakness and hypotension.  She was diagnosed with acute on chronic anemia and GI team was consulted.  On 09/30/22 he underwent EGD which showed gastritis/duodenitis and an esophageal ulcer without any stigmata of recent bleeding.  Patient had endorsed black tarry stools prior to coming to the emergency room.  He also endorsed that he went to see his general practitioner and the placed him on Eliquis 5 mg twice daily for DVT treatment.   On exam this afternoon patient is resting comfortably in bed.  Bilateral groins continue to have staples which will be taken out today.  Patient endorses some soreness to bilateral groins.  Patient denies any shortness of breath chest pain or stomach pain.  No complaints overnight vitals all remained stable.   Vitals:   10/06/22 0848 10/06/22 1228  BP: (!) 135/49 (!) 154/53  Pulse: (!) 54 60  Resp: 15 20  Temp: 97.9 F (36.6 C) 98.7 F (37.1 C)  SpO2: 100% 99%   Physical Exam: Cardiac:  RRR, Normal S1 and S2, No gallpos, clicks or rubs. Lungs: Normal nonlabored breathing, without rales rhonchi or wheezing.  Clear throughout on auscultation. Incisions: Left groin staples removed today.  Incision line remain this erythemic and warm to touch.  Right groin staples removed today.  No signs or symptoms of infection hematoma seroma to note. Extremities: The lower extremities with palpable pulses.  Bilateral lower extremities warm to touch. Abdomen: Positive bowel sounds, soft, nontender and nondistended  Neurologic: Alert and oriented x  3, follows all commands appropriately, answers all questions appropriately.  CBC    Component Value Date/Time   WBC 6.9 10/06/2022 0528   RBC 2.75 (L) 10/06/2022 0528   HGB 8.2 (L) 10/06/2022 0528   HGB 14.0 06/13/2019 1612   HCT 24.7 (L) 10/06/2022 0528   HCT 41.5 06/13/2019 1612   PLT 245 10/06/2022 0528   PLT 258 06/13/2019 1612   MCV 89.8 10/06/2022 0528   MCV 92 06/13/2019 1612   MCH 29.8 10/06/2022 0528   MCHC 33.2 10/06/2022 0528   RDW 13.6 10/06/2022 0528   RDW 12.8 06/13/2019 1612   LYMPHSABS 4.3 (H) 09/29/2022 1457   LYMPHSABS 2.1 06/13/2019 1612   MONOABS 1.0 09/29/2022 1457   EOSABS 1.5 (H) 09/29/2022 1457   EOSABS 0.6 (H) 06/13/2019 1612   BASOSABS 0.1 09/29/2022 1457   BASOSABS 0.1 06/13/2019 1612    BMET    Component Value Date/Time   NA 137 10/06/2022 0528   NA 134 06/18/2020 1556   K 4.1 10/06/2022 0528   CL 107 10/06/2022 0528   CO2 24 10/06/2022 0528   GLUCOSE 86 10/06/2022 0528   BUN 20 10/06/2022 0528   BUN 21 06/18/2020 1556   CREATININE 1.22 10/06/2022 0528   CALCIUM 8.4 (L) 10/06/2022 0528   GFRNONAA >60 10/06/2022 0528   GFRAA 66 06/18/2020 1556    INR    Component Value Date/Time   INR 1.3 (H) 09/29/2022 1827     Intake/Output Summary (Last 24 hours) at 10/06/2022 1622 Last data filed at 10/06/2022 1425 Gross per 24 hour  Intake  240 ml  Output 1000 ml  Net -760 ml     Assessment/Plan:  77 y.o. male is s/p bilateral femoral endarterectomy with bilateral iliac stent placement on 09/05/2022  3 Days Post-Op   Patient recovering as expected. He was placed on IV antibiotics for questionable cellulitis of the left groin.  This appears to be healing slowly with redness but without any infection.  Patient was also started on Eliquis 5 mg twice daily for known right lower extremity DVT which appears to have caused his acute on chronic anemia and possible esophageal ulcer.  Vascular surgery recommends stopping the Eliquis.  Patient will need to  be on aspirin and Plavix post his surgical procedure.  Vascular surgery okay for patient discharge to home when hospitalist team deems appropriate.  DVT prophylaxis: ASA 81 mg daily and Plavix 75 mg daily.   [facility] 10/06/2022 4:22 PM

## 2022-10-06 NOTE — Progress Notes (Signed)
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   1 area of active bleeding seen in mid small bowel .   If he is clinically having signs of bleeding with melena, drop in hb then transfer to [facility] or [facility] for balloon enteroscopy . If no evidence of active bleeding then can go home with clear instructions that if he bleeds to go to [facility] or [facility] ER since we do not have baloon enteroscopy either here or at [facility] cone . If has no bleeding to follow up with Dr Servando Snare as an outpatient . Avoid nsaids and blood thinners.   Dr Wyline Mood MD,MRCP [facility]) Gastroenterology/Hepatology Pager: [phone]

## 2022-10-07 DIAGNOSIS — K922 Gastrointestinal hemorrhage, unspecified: Secondary | ICD-10-CM | POA: Diagnosis not present

## 2022-10-07 DIAGNOSIS — D62 Acute posthemorrhagic anemia: Secondary | ICD-10-CM | POA: Diagnosis not present

## 2022-10-07 DIAGNOSIS — E1169 Type 2 diabetes mellitus with other specified complication: Secondary | ICD-10-CM | POA: Diagnosis not present

## 2022-10-07 LAB — CBC
HCT: 24.2 % — ABNORMAL LOW (ref 39.0–52.0)
Hemoglobin: 8 g/dL — ABNORMAL LOW (ref 13.0–17.0)
MCH: 29.6 pg (ref 26.0–34.0)
MCHC: 33.1 g/dL (ref 30.0–36.0)
MCV: 89.6 fL (ref 80.0–100.0)
Platelets: 266 K/uL (ref 150–400)
RBC: 2.7 MIL/uL — ABNORMAL LOW (ref 4.22–5.81)
RDW: 13.5 % (ref 11.5–15.5)
WBC: 7.4 K/uL (ref 4.0–10.5)
nRBC: 0 % (ref 0.0–0.2)

## 2022-10-07 LAB — BASIC METABOLIC PANEL WITH GFR
Anion gap: 8 (ref 5–15)
BUN: 19 mg/dL (ref 8–23)
CO2: 25 mmol/L (ref 22–32)
Calcium: 8.5 mg/dL — ABNORMAL LOW (ref 8.9–10.3)
Chloride: 105 mmol/L (ref 98–111)
Creatinine, Ser: 1.15 mg/dL (ref 0.61–1.24)
GFR, Estimated: >60 mL/min
Glucose, Bld: 134 mg/dL — ABNORMAL HIGH (ref 70–99)
Potassium: 4.5 mmol/L (ref 3.5–5.1)
Sodium: 138 mmol/L (ref 135–145)

## 2022-10-07 LAB — MAGNESIUM: Magnesium: 1.9 mg/dL (ref 1.7–2.4)

## 2022-10-07 LAB — GLUCOSE, CAPILLARY
Glucose-Capillary: 106 mg/dL — ABNORMAL HIGH (ref 70–99)
Glucose-Capillary: 148 mg/dL — ABNORMAL HIGH (ref 70–99)
Glucose-Capillary: 157 mg/dL — ABNORMAL HIGH (ref 70–99)

## 2022-10-07 LAB — SARS CORONAVIRUS 2 BY RT PCR: SARS Coronavirus 2 by RT PCR: NEGATIVE

## 2022-10-07 MED ORDER — OXYCODONE-ACETAMINOPHEN 5-325 MG PO TABS
1.0000 | ORAL_TABLET | ORAL | Status: DC | PRN
  Administered 2022-10-07 – 2022-10-16 (×22): 1 via ORAL
  Filled 2022-10-07 (×23): qty 1

## 2022-10-07 MED ORDER — TERAZOSIN HCL 2 MG PO CAPS
2.0000 mg | ORAL_CAPSULE | Freq: Every day | ORAL | Status: DC
  Administered 2022-10-07 – 2022-10-15 (×9): 2 mg via ORAL
  Filled 2022-10-07 (×10): qty 1

## 2022-10-07 NOTE — Progress Notes (Signed)
PROGRESS NOTE   HPI was taken from Dr. Sedalia Muta:  William Ball is a 77 year old male with history of hypertension, hyperlipidemia, tobacco use, non-insulin-dependent diabetes mellitus, constipation, PAD status post bilateral femoral enterectomy and bilateral iliac stents placed on 09/05/2022, history of occlusive right peroneal vein DVT, who presents emergency department for chief concerns of hypotension and weakness.   Vitals in the ED showed temperature of 97.7, respiration rate of 16, heart rate of 71, blood pressure 120/62, SpO2 98% on room air.   Serum sodium is 129, potassium 5.0, chloride 98, bicarb 20, BUN of 110, serum creatinine 1.91, EGFR 36, nonfasting blood glucose 170, WBC 17.3, hemoglobin 9.8, platelets of 414.   ED treatment: Protonix 40 mg IV, gabapentin 100 mg p.o., ceftriaxone 2 g IV, sodium chloride 500 mL bolus, vancomycin ------------------------------ At bedside, he was able to tell me his name, age, current calendar year, current locatin. He reports weakness and hypotension that he noticed today.  He reports his blood pressure normally runs in the 130s and today is was in the low 120s.  He endorses weakness in the last 2 days with poor p.o. intake in the last 2 days.   He reports right ankle, started to hurt since discharge from hsoptial. He has been trying epsom salt which did help.   He reports three days of melena stool. He denies taking iron tablets and nsaids at home. He is one aspirin, plavix, and eliquis at home   As per Dr. Stephania Fragmin: 81 year old with history of HTN, HLD, tobacco use, non-insulin-dependent DM2, constipation, PAD status post bilateral femoral endarterectomy with bilateral iliac stent placement on 09/05/2022, occlusive right lower extremity DVT comes to the ED with weakness and hypotension.  Patient was noted to have hemoglobin of 9.8, started on PPI, given antibiotics in the ED.  Patient was diagnosed with acute on chronic anemia.  GI team consulted.   EGD 5/28 showed gastritis/duodenitis and esophageal ulcer without any stigmata of recent bleeding.  GI recommended PPI.  Seen by vascular team and for now discontinue Eliquis as patient will not need DVT treatment but will need to continue aspirin and Plavix.  Hemoglobin slowly drifted down therefore received 1 unit of PRBC.  Discussed with GI who placed video capsule.     As per Dr. 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 1 area of active bleeding seen in mid small bowel. [facility] and [facility] do not have balloon enteroscopy so pt will need to be transferred to [facility] for this procedure. Pt has been accepted to [facility] for this procedure. Pt will be transferred back to [facility] likely sometime after his procedure. Please see previous progress/consult notes for more information.    Still waiting on bed at [facility]. D/c summary, orders & EMTALA done already.    William Ball  AOZ:[id number] DOB: 1946/02/19 DOA: 09/29/2022 PCP: Luciana Axe, NP    Assessment & Plan:   Principal Problem:   Upper GI bleed Active Problems:   Hyperlipidemia   BPH (benign prostatic hyperplasia)   Personal history of tobacco use, presenting hazards to health   Hypertension associated with diabetes (HCC)   PVD (peripheral vascular disease) (HCC)   Hyponatremia   Overweight (BMI 25.0-29.9)   AKI (acute kidney injury) (HCC)   Leukocytosis   Cellulitis of left groin   Deep venous thrombosis (DVT) of right peroneal vein (HCC)   Essential hypertension   Insulin dependent type 2  diabetes mellitus (HCC)   Right ankle pain   Cellulitis of drainage site, post-operative   Ulcer of esophagus without bleeding  Assessment and Plan: Upper GI bleed: s/p EGD which showed gastritis, duodenitis, & esophageal ulcer w/o any stigmata of recent bleeding. 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 No balloon enteroscopy available here or  & if clinically having signs of bleeding will need to be transferred to [facility] or [facility]. Eliquis was d/c. Pt has been accepted to transfer to [facility] for balloon enteroscopy but waiting on a bed. GI recs apprec   Acute blood loss anemia: on anticoagulation. Likely secondary to gastritis, duodenitis & esophageal ulcer. S/p 1 unit of pRBCs transfused so far. H&H are trending down today.    Right ankle pain: XR shows soft tissue swelling but no fracture. RICE.    DM2: poorly controlled, HbA1c 8.0. Continue on glargine, SSI w/ accuchecks   Peripheral neuropathy: continue on home dose of gabapentin    HTN: continue on coreg    DVT: of right peroneal vein. Hx of PAD s/p bilateral femoral endarterectomy with bilateral iliac stent placement 09/10/22. Continue on aspirin, plavix but eliquis was d/c as per vasc surg   Sciatica nerve pain of RLE: completed prednisone taper    Cellulitis of left groin: w/ possible small groin hematoma. Completed 7 days ancef    AKI: Cr is labile. Avoid nephrotoxic meds     Overweight: BMI 25.3. Would benefit from weight loss    Hyponatremia: resolved    BPH: restarted home dose of terazosin   HLD: continue on statin          DVT prophylaxis: SCDs Code Status: full Family Communication: discussed pt's care w/ pt's family at bedside  Disposition Plan: likely d/c back home   Level of care: Telemetry Cardiac  Status is: Inpatient Remains inpatient appropriate because: positive capsule study, will likely need transfer    Consultants:  GI   Procedures:   Antimicrobials:   Subjective: Pt c/o fatigue   Objective: Vitals:   10/06/22 1958 10/06/22 2353 10/07/22 0459 10/07/22 0813  BP: (!) 178/48 (!) 150/62 (!) 152/57 (!) 148/53  Pulse: (!) 49 (!) 52 (!) 55 (!) 47  Resp: 18 18 18 19   Temp: 98.3 F (36.8 C) 98.6 F (37 C) 99.1 F (37.3 C) 98.6 F (37 C)  TempSrc: Oral Oral Oral    SpO2: 98% 98% 99% 100%  Weight:      Height:        Intake/Output Summary (Last 24 hours) at 10/07/2022 0820 Last data filed at 10/07/2022 0200 Gross per 24 hour  Intake 714 ml  Output --  Net 714 ml   Filed Weights   09/29/22 1454 09/30/22 1424  Weight: 81.2 kg 77.8 kg    Examination:  General exam: Appears comfortable  Respiratory system: clear breath sounds b/l   Cardiovascular system: S1/S2+. No rubs or clicks  Gastrointestinal system: Abd is soft, NT, ND & normal bowel sounds  Central nervous system: alert & oriented. Moves all extremities  Psychiatry: judgement and insight appears poor. Flat mood and affect     Data Reviewed: I have personally reviewed following labs and imaging studies  CBC: Recent Labs  Lab 10/03/22 0506 10/04/22 0558 10/05/22 0542 10/06/22 0528 10/07/22 0548  WBC 7.0 6.5 6.6 6.9 7.4  HGB 8.3* 8.5* 7.9* 8.2*  8.0*  HCT 24.8* 25.1* 23.6* 24.7* 24.2*  MCV 88.3 89.0 88.7 89.8 89.6  PLT 284 260 229 245 266   Basic Metabolic Panel: Recent Labs  Lab 10/03/22 0506 10/04/22 0558 10/05/22 0542 10/06/22 0528 10/07/22 0548  NA 136 138 137 137 138  K 4.2 4.3 4.1 4.1 4.5  CL 107 109 107 107 105  CO2 22 23 23 24 25   GLUCOSE 70 54* 78 86 134*  BUN 34* 20 14 20 19   CREATININE 1.30* 1.26* 1.17 1.22 1.15  CALCIUM 8.6* 8.6* 8.3* 8.4* 8.5*  MG 1.8 1.7 1.9 1.8 1.9   GFR: Estimated Creatinine Clearance: 53.8 mL/min (by C-G formula based on SCr of 1.15 mg/dL). Liver Function Tests: No results for input(s): "AST", "ALT", "ALKPHOS", "BILITOT", "PROT", "ALBUMIN" in the last 168 hours.  No results for input(s): "LIPASE", "AMYLASE" in the last 168 hours. No results for input(s): "AMMONIA" in the last 168 hours. Coagulation Profile: No results for input(s): "INR", "PROTIME" in the last 168 hours.  Cardiac Enzymes: No results for input(s): "CKTOTAL", "CKMB", "CKMBINDEX", "TROPONINI" in the last 168 hours. BNP (last 3 results) No results for  input(s): "PROBNP" in the last 8760 hours. HbA1C: No results for input(s): "HGBA1C" in the last 72 hours. CBG: Recent Labs  Lab 10/06/22 0858 10/06/22 1229 10/06/22 1649 10/06/22 1959 10/07/22 0814  GLUCAP 77 152* 162* 154* 106*   Lipid Profile: No results for input(s): "CHOL", "HDL", "LDLCALC", "TRIG", "CHOLHDL", "LDLDIRECT" in the last 72 hours. Thyroid Function Tests: No results for input(s): "TSH", "T4TOTAL", "FREET4", "T3FREE", "THYROIDAB" in the last 72 hours. Anemia Panel: No results for input(s): "VITAMINB12", "FOLATE", "FERRITIN", "TIBC", "IRON", "RETICCTPCT" in the last 72 hours. Sepsis Labs: No results for input(s): "PROCALCITON", "LATICACIDVEN" in the last 168 hours.   Recent Results (from the past 240 hour(s))  Blood culture (routine x 2)     Status: None   Collection Time: 09/29/22  6:32 PM   Specimen: BLOOD  Result Value Ref Range Status   Specimen Description BLOOD LEFT ANTECUBITAL  Final   Special Requests   Final    BOTTLES DRAWN AEROBIC AND ANAEROBIC Blood Culture adequate volume   Culture   Final    NO GROWTH 5 DAYS Performed at [facility], [address] [place], [place] [address]    Report Status 10/04/2022 FINAL  Final  Blood culture (routine x 2)     Status: None   Collection Time: 09/29/22  6:32 PM   Specimen: BLOOD  Result Value Ref Range Status   Specimen Description BLOOD BLOOD LEFT FOREARM  Final   Special Requests   Final    BOTTLES DRAWN AEROBIC AND ANAEROBIC Blood Culture results may not be optimal due to an excessive volume of blood received in culture bottles   Culture   Final    NO GROWTH 5 DAYS Performed at [facility], [address], [place], [place] [address]    Report Status 10/04/2022 FINAL  Final         Radiology Studies: No results found.      Scheduled Meds:  sodium chloride   Intravenous Once   aspirin EC  81 mg Oral Daily   atorvastatin  40 mg Oral QHS   carvedilol  3.125 mg Oral  BID   clopidogrel  75 mg Oral Daily   folic acid  1 mg Oral Daily   gabapentin  100 mg Oral TID   insulin aspart  0-5 Units Subcutaneous QHS  insulin aspart  0-9 Units Subcutaneous TID WC   insulin glargine-yfgn  30 Units Subcutaneous Daily   multivitamin with minerals  1 tablet Oral Daily   pantoprazole  40 mg Oral BID   senna-docusate  2 tablet Oral BID   thiamine  100 mg Oral Daily   Continuous Infusions:     LOS: 8 days    Time spent: 25 mins     Charise Killian, MD [facility] Pager [phone]  If 7PM-7AM, please contact night-coverage [email or web address] 10/07/2022, 8:20 AM

## 2022-10-08 ENCOUNTER — Encounter: Payer: Self-pay | Admitting: Internal Medicine

## 2022-10-08 DIAGNOSIS — K922 Gastrointestinal hemorrhage, unspecified: Secondary | ICD-10-CM | POA: Diagnosis not present

## 2022-10-08 LAB — GLUCOSE, CAPILLARY
Glucose-Capillary: 194 mg/dL — ABNORMAL HIGH (ref 70–99)
Glucose-Capillary: 126 mg/dL — ABNORMAL HIGH (ref 70–99)
Glucose-Capillary: 139 mg/dL — ABNORMAL HIGH (ref 70–99)
Glucose-Capillary: 190 mg/dL — ABNORMAL HIGH (ref 70–99)
Glucose-Capillary: 202 mg/dL — ABNORMAL HIGH (ref 70–99)

## 2022-10-08 NOTE — Progress Notes (Signed)
Physical Therapy Treatment Patient Details Name: William Ball MRN: [id number] DOB: 11-25-1945 Today's Date: 10/08/2022   History of Present Illness Pt is a 77 y.o. male with history of hypertension, hyperlipidemia, tobacco abuse, diabetes mellitus 2 non-insulin-dependent, constipation, PAD.  Patient is now status post bilateral femoral endarterectomies with bilateral iliac stents placed on 09/10/2026.  He was discharged on 09/15/2022 to home doing well.  He presents to [facility] emergency department on 09/29/2022 with a chief complaint of hypotension and weakness, tarry stools.    PT Comments    Pt was sitting in recliner upon arrival. He is A and O x 4. Agrees to session and remains motivated throughout. Most of session spent ambulating with +1 HHA. Pt prefers to use RW due to R ankle pain. He tolerated session well. Acute PT will continue to follow per current POC progressing as able. DC recs remain appropriate however pt planning to transfer to [facility] prior to Dcing home.   Recommendations for follow up therapy are one component of a multi-disciplinary discharge planning process, led by the attending physician.  Recommendations may be updated based on patient status, additional functional criteria and insurance authorization.     Assistance Recommended at Discharge PRN  Patient can return home with the following A little help with bathing/dressing/bathroom;Assistance with cooking/housework;Assist for transportation;Help with stairs or ramp for entrance   Equipment Recommendations  None recommended by PT       Precautions / Restrictions Precautions Precautions: Fall Restrictions Weight Bearing Restrictions: No     Mobility  Bed Mobility  General bed mobility comments: In recliner pre/post session    Transfers Overall transfer level: Modified independent Equipment used: 1 person hand held assist, None Transfers: Sit to/from Stand Sit to Stand: Modified independent (Device/Increase time)    Ambulation/Gait Ambulation/Gait assistance: Min guard Gait Distance (Feet): 200 Feet Assistive device: None, 1 person hand held assist Gait Pattern/deviations: Step-through pattern, Decreased stance time - right, Decreased stance time - left, Decreased step length - right, Decreased step length - left, Antalgic Gait velocity: decreased  General Gait Details: Attempted gait without AD however much more antalgic. Elected to ambulate with +1 HHA however pt much prefers to use RW. less antalgic gait kinematics with BUE support on RW. Pt required +1 standing rest when ambulating with HHA +1   Stairs  General stair comments: pt requested not to perform stairs again this date     Balance Overall balance assessment: Modified Independent Sitting-balance support: Bilateral upper extremity supported, Feet supported    Standing balance support: Single extremity supported, During functional activity Standing balance-Leahy Scale: Fair Standing balance comment: recommen pt use BUE support at all times when not working with therapy      Cognition Arousal/Alertness: Awake/alert Behavior During Therapy: WFL for tasks assessed/performed Overall Cognitive Status: Within Functional Limits for tasks assessed        General Comments: Very pleasant man, agreeable to participate           General Comments General comments (skin integrity, edema, etc.): Reviewd importance of keep RLE elevated to assist with swelling and improve circulation      Pertinent Vitals/Pain Pain Assessment Pain Assessment: 0-10 Pain Score: 6  Pain Location: R ankle with weight bearing Pain Descriptors / Indicators: Guarding, Discomfort Pain Intervention(s): Limited activity within patient's tolerance, Monitored during session, Premedicated before session, Repositioned     PT Goals (current goals can now be found in the care plan section) Acute Rehab PT Goals Patient Stated Goal: get  better and go home Progress  towards PT goals: Progressing toward goals    Frequency    Min 2X/week      PT Plan Current plan remains appropriate       AM-PAC PT "6 Clicks" Mobility   Outcome Measure  Help needed turning from your back to your side while in a flat bed without using bedrails?: None Help needed moving from lying on your back to sitting on the side of a flat bed without using bedrails?: None Help needed moving to and from a bed to a chair (including a wheelchair)?: None Help needed standing up from a chair using your arms (e.g., wheelchair or bedside chair)?: None Help needed to walk in hospital room?: None Help needed climbing 3-5 steps with a railing? : A Little 6 Click Score: 23    End of Session   Activity Tolerance: Patient tolerated treatment well Patient left: in chair;with chair alarm set;with call bell/phone within reach Nurse Communication: Mobility status PT Visit Diagnosis: Other abnormalities of gait and mobility (R26.89);Muscle weakness (generalized) (M62.81);Pain;Unsteadiness on feet (R26.81) Pain - Right/Left: Right Pain - part of body: Ankle and joints of foot     Time: 4540-9811 PT Time Calculation (min) (ACUTE ONLY): 8 min  Charges:  $Gait Training: 8-22 mins                     Jetta Lout PTA 10/08/22, 11:58 AM

## 2022-10-08 NOTE — Discharge Summary (Signed)
Physician Discharge Summary  William Ball ZOX:[id number] DOB: 01-15-1946 DOA: 09/29/2022  PCP: Luciana Axe, NP  Admit date: 09/29/2022 Discharge date: 10/08/2022  Discharge disposition: Transferred to [facility]   Recommendations for Outpatient Follow-Up:   Follow up with PCP in 1-2 weeks F/u w/ GI, Dr. 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  Diet recommendation:  Diet Order             Diet - low sodium heart healthy           Diet Carb Modified           Diet Carb Modified Fluid consistency: Thin  Diet effective now                     Code Status: Full Code     Hospital Course:   [name] is a 77 year old male with history of hypertension, hyperlipidemia, tobacco use, non-insulin-dependent diabetes mellitus, constipation, PAD status post bilateral femoral enterectomy and bilateral iliac stents placed on 09/05/2022, history of occlusive right peroneal vein DVT, who presents emergency department for chief concerns of hypotension and weakness.  He also reported 3 days of melena.  He takes aspirin, Plavix and Eliquis at home.  Patient was noted to have hemoglobin of 9.8, started on PPI, given antibiotics in the ED. Patient was diagnosed with acute on chronic anemia. GI team consulted. EGD 5/28  showed gastritis/duodenitis and esophageal ulcer without any stigmata of recent bleeding. GI recommended PPI. Seen by vascular team and for now discontinue Eliquis as patient will not need DVT treatment but will need to continue aspirin and Plavix. Hemoglobin slowly drifted down therefore received 1 unit of PRBC.  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  [facility] and [facility] and not capable of performing balloon enteroscopy so transferred to [facility] for further management was recommended.  He has been accepted at [facility] for procedure to be performed and patient will likely be transferred back to [facility] after procedure is completed.    Assessment and plan   Upper GI bleed: s/p EGD which showed gastritis, duodenitis, & esophageal ulcer w/o any stigmata of recent bleeding. 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 No balloon enteroscopy available here or [facility] so patient has been transferred to [facility] for further management.  Awaiting hide assignments.     Acute blood loss anemia: on anticoagulation. Likely secondary to gastritis, duodenitis & esophageal ulcer. S/p 1 unit of pRBCs transfused so far. H&H are labile  Right ankle pain: XR shows soft tissue swelling but no fracture.    DM2: HbA1c 8.0, poorly controlled. Continue on glargine, SSI w/ accuchecks    Peripheral neuropathy: continue on home dose of gabapentin    HTN: continue on coreg    DVT: of right peroneal vein, persitent. Hx of PAD s/p bilateral femoral endarterectomy with bilateral iliac stent placement 09/10/22. Continue on aspirin, plavix & d/c eliquis as per vasc surg. No need for eliquis anymore as per vasc surg    Sciatica nerve pain of RLE: completed prednisone taper     Cellulitis of left groin: w/ possible small groin hematoma. Completed 7 days of ancef    AKI: Cr has improved.  Avoid nephrotoxic meds    Overweight: BMI 25.3. Would benefit from weight loss    Hyponatremia: resolved    BPH: can restart home dose of terazosin tomorrow if BP remains stable    HLD: continue on statin              Medical Consultants:   Gastroenterologist   Discharge Exam:    Vitals:   10/08/22 0417 10/08/22 0802 10/08/22 1222 10/08/22 1639  BP: (!) 142/47 (!) 147/49 (!) 138/51 (!) 143/51  Pulse: (!) 56 (!) 58 (!) 51 60  Resp: 20 18 18 18   Temp: 99 F (37.2 C) 97.8 F (36.6 C) 99 F (37.2 C) 98.7 F (37.1 C)  TempSrc: Oral     SpO2: 98% 99% 100% 100%  Weight:      Height:         GEN: NAD SKIN: Warm and dry EYES: No pallor or icterus ENT: MMM CV: RRR PULM: CTA B ABD: soft, ND, NT, +BS CNS: AAO x 3, non focal EXT: No edema or tenderness   The results of significant diagnostics from this hospitalization (including imaging, microbiology, ancillary and laboratory) are listed below for reference.     Procedures and Diagnostic Studies:   DG Ankle 2 Views Right  Result Date: 09/29/2022 CLINICAL DATA:  [id number] with ankle pain and purulent cellulitis. EXAM: RIGHT ANKLE - 2 VIEW COMPARISON:  None Available. FINDINGS: There is no evidence of fracture, dislocation, or joint effusion. There is no evidence of arthropathy or acute focal bone abnormality. Moderate posterior and plantar calcaneal enthesopathic spurring is noted. Mild anterior soft tissue fullness is noted with otherwise unremarkable soft tissues. IMPRESSION: 1. Mild anterior soft tissue fullness without underlying osseous abnormality. 2. Moderate posterior and plantar calcaneal enthesopathic spurring. Electronically Signed   By: Almira Bar M.D.   On: 09/29/2022 22:13   [place] Lower Ext Art Left Ltd  Result Date: 09/29/2022 CLINICAL DATA:  Recent endovascular stent placement. Redness  and drainage at incision site in left groin. EXAM: LEFT LOWER EXTREMITY ARTERIAL DUPLEX SCAN TECHNIQUE: Gray-scale sonography as well as color Doppler and duplex ultrasound was performed to evaluate the left common femoral artery, superficial femoral artery and profunda femoral artery in the area of concern COMPARISON:  None Available. FINDINGS: Left lower Extremity No pseudoaneurysm seen. No visible AV fistula. Hypoechoic material is seen in the left groin adjacent to the vessels and in the left groin, likely small hematoma. Area in the left groin measures approximately 1 cm in maximum diameter. IMPRESSION: No evidence of pseudoaneurysm or AV fistula. Probable small left groin hematoma. Electronically Signed   By: Charlett Nose M.D.   On: 09/29/2022 21:06   US Venous Img Lower Bilateral  Result Date: 09/29/2022 CLINICAL DATA:  Leg swelling.  Follow-up  DVT. EXAM: BILATERAL LOWER EXTREMITY VENOUS DOPPLER ULTRASOUND TECHNIQUE: Gray-scale sonography with graded compression, as well as color Doppler and duplex ultrasound were performed to evaluate the lower extremity deep venous systems from the level of the common femoral vein and including the common femoral, femoral, profunda femoral, popliteal and calf veins including the posterior tibial, peroneal and gastrocnemius veins when visible. The superficial great saphenous vein was also interrogated. Spectral Doppler was utilized to evaluate flow at rest and with distal augmentation maneuvers in the common femoral, femoral and popliteal veins. COMPARISON:  Right lower extremity duplex 09/22/2022. FINDINGS: RIGHT LOWER EXTREMITY Common Femoral Vein: No evidence of thrombus. Normal compressibility, respiratory phasicity and response to augmentation. Saphenofemoral Junction: No evidence of thrombus. Normal compressibility and flow on color Doppler imaging. Profunda Femoral Vein: No evidence of thrombus. Normal compressibility and flow on color Doppler imaging. Femoral  Vein: No evidence of thrombus. Normal compressibility, respiratory phasicity and response to augmentation. Popliteal Vein: No evidence of thrombus. Normal compressibility, respiratory phasicity and response to augmentation. Calf Veins: Again seen occlusion of the peroneal vein. Posterior tibial vein is patent. Superficial Great Saphenous Vein: No evidence of thrombus. Normal compressibility. Venous Reflux:  None. Other Findings:  None. LEFT LOWER EXTREMITY Common Femoral Vein: No evidence of thrombus. Normal compressibility, respiratory phasicity and response to augmentation. Saphenofemoral Junction: No evidence of thrombus. Normal compressibility and flow on color Doppler imaging. Profunda Femoral Vein: No evidence of thrombus. Normal compressibility and flow on color Doppler imaging. Femoral Vein: No evidence of thrombus. Normal compressibility, respiratory phasicity and response to augmentation. Popliteal Vein: No evidence of thrombus. Normal compressibility, respiratory phasicity and response to augmentation. Calf Veins: No evidence of thrombus. Normal compressibility and flow on color Doppler imaging. Superficial Great Saphenous Vein: No evidence of thrombus. Normal compressibility. Venous Reflux:  None. Other Findings:  None. IMPRESSION: 1. 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 2. No left lower extremity DVT. Electronically Signed   By: Narda Rutherford M.D.   On: 09/29/2022 19:28   DG Chest Port 1 View  Result Date: 09/29/2022 CLINICAL DATA:  Intractable nausea and vomiting. Weakness and hypotension. EXAM: PORTABLE CHEST 1 VIEW COMPARISON:  Chest radiographs 01/27/2017, CT abdomen and pelvis 01/27/2017 FINDINGS: Moderate atherosclerotic calcifications within the aortic arch. A benign calcified granuloma again overlies the inferolateral left lung, also seen on prior CT. The lungs are clear. No pleural effusion or pneumothorax. No acute skeletal abnormality.  IMPRESSION: No acute cardiopulmonary disease process. Electronically Signed   By: Neita Garnet M.D.   On: 09/29/2022 18:29     Labs:   Basic Metabolic Panel: Recent Labs  Lab 10/03/22 0506 10/04/22 0558 10/05/22 0542 10/06/22 0528 10/07/22 0548  NA 136 138 137 137 138  K 4.2 4.3 4.1 4.1 4.5  CL 107 109 107 107 105  CO2 22 23 23 24 25   GLUCOSE 70 54* 78 86 134*  BUN 34* 20 14 20 19   CREATININE 1.30* 1.26* 1.17 1.22 1.15  CALCIUM 8.6* 8.6* 8.3* 8.4* 8.5*  MG 1.8 1.7 1.9 1.8 1.9   GFR Estimated Creatinine Clearance: 53.8 mL/min (by C-G formula based on SCr of 1.15 mg/dL). Liver Function Tests: No results for input(s): "AST", "ALT", "ALKPHOS", "BILITOT", "PROT", "ALBUMIN" in the last 168 hours. No results for input(s): "LIPASE", "AMYLASE" in the last 168 hours. No results for input(s): "AMMONIA" in the last 168 hours. Coagulation profile No results for input(s): "INR", "PROTIME" in the last 168 hours.  CBC: Recent Labs  Lab 10/03/22 0506 10/04/22 0558 10/05/22 0542 10/06/22 0528 10/07/22 0548  WBC 7.0 6.5 6.6 6.9 7.4  HGB 8.3* 8.5* 7.9* 8.2* 8.0*  HCT 24.8* 25.1* 23.6* 24.7* 24.2*  MCV 88.3 89.0 88.7 89.8 89.6  PLT 284 260 229 245 266   Cardiac Enzymes: No results for input(s): "CKTOTAL", "CKMB", "CKMBINDEX", "TROPONINI" in the last 168 hours. BNP: Invalid input(s): "POCBNP" CBG: Recent Labs  Lab 10/07/22 1711 10/07/22 2135 10/08/22 0803 10/08/22 1224 10/08/22 1640  GLUCAP 157* 194* 126* 139* 190*   D-Dimer No results for input(s): "DDIMER" in the last 72 hours. Hgb A1c No results for input(s): "HGBA1C" in the last 72 hours. Lipid Profile No results for input(s): "CHOL", "HDL", "LDLCALC", "TRIG", "CHOLHDL", "LDLDIRECT" in the last 72 hours. Thyroid function studies No results for input(s): "TSH", "T4TOTAL", "T3FREE", "THYROIDAB" in the last 72 hours.  Invalid input(s): "FREET3" Anemia work up No results for input(s): "VITAMINB12", "FOLATE",  "FERRITIN", "TIBC", "IRON", "RETICCTPCT" in the last 72 hours. Microbiology Recent Results (from the past 240 hour(s))  Blood culture (routine x 2)     Status: None   Collection Time: 09/29/22  6:32 PM   Specimen: BLOOD  Result Value Ref Range Status   Specimen Description BLOOD LEFT ANTECUBITAL  Final   Special Requests   Final    BOTTLES DRAWN AEROBIC AND ANAEROBIC Blood Culture adequate volume   Culture   Final    NO GROWTH 5 DAYS Performed at [facility], [address] [place], [place] [address]    Report Status 10/04/2022 FINAL  Final  Blood culture (routine x 2)     Status: None   Collection Time: 09/29/22  6:32 PM   Specimen: BLOOD  Result Value Ref Range Status   Specimen Description BLOOD BLOOD LEFT FOREARM  Final   Special Requests   Final    BOTTLES DRAWN AEROBIC AND ANAEROBIC Blood Culture results may not be optimal due to an excessive volume of blood received in culture bottles   Culture   Final    NO GROWTH 5 DAYS Performed at [facility], [address] [place], [place] [address]    Report Status 10/04/2022 FINAL  Final  SARS Coronavirus 2 by RT PCR (hospital order, performed in [facility] hospital lab) *cepheid single result test* Anterior Nasal Swab     Status: None   Collection Time: 10/07/22  9:35 AM   Specimen: Anterior Nasal Swab  Result Value Ref Range Status   SARS Coronavirus 2 by RT PCR NEGATIVE NEGATIVE Final    Comment: (NOTE) SARS-CoV-2 target nucleic acids are NOT DETECTED.  The SARS-CoV-2 RNA is generally detectable in upper and lower respiratory specimens during the acute phase of infection. The lowest concentration of SARS-CoV-2 viral copies this assay can detect is 250 copies / mL. A negative result does not preclude SARS-CoV-2 infection and should not be used as the sole basis for treatment or other patient management decisions.  A negative result may occur with improper specimen collection / handling, submission of  specimen other than nasopharyngeal swab, presence of viral mutation(s) within the areas targeted by this assay, and inadequate number of viral copies (<250 copies / mL). A negative result must be combined with clinical observations, patient history, and epidemiological information.  Fact Sheet for Patients:   RoadLapTop.co.za  Fact Sheet for Healthcare Providers: http://kim-miller.com/  This test is not yet approved or  cleared by the [place] and has been authorized  for detection and/or diagnosis of SARS-CoV-2 by FDA under an Emergency Use Authorization (EUA).  This EUA will remain in effect (meaning this test can be used) for the duration of the COVID-19 declaration under Section 564(b)(1) of the Act, 21 U.S.C. section 360bbb-3(b)(1), unless the authorization is terminated or revoked sooner.  Performed at [facility], [address], [place], [place] [address]      Discharge Instructions:   Discharge Instructions     Diet - low sodium heart healthy   Complete by: As directed    Diet Carb Modified   Complete by: As directed    Discharge instructions   Complete by: As directed    F/u w/ PCP in 1-2 weeks. F/u w/ GI, Dr. Servando Snare, in 1-2 weeks.   Increase activity slowly   Complete by: As directed       Allergies as of 10/08/2022       Reactions   Amlodipine Itching        Medication List     STOP taking these medications    apixaban 5 MG Tabs tablet Commonly known as: ELIQUIS       TAKE these medications    albuterol 108 (90 Base) MCG/ACT inhaler Commonly known as: VENTOLIN HFA Inhale 1-2 puffs into the lungs every 6 (six) hours as needed for wheezing or shortness of breath.   aspirin EC 81 MG tablet Take 81 mg by mouth daily.   atorvastatin 40 MG tablet Commonly known as: LIPITOR Take 40 mg by mouth daily.   carvedilol 6.25 MG tablet Commonly known as: COREG Take 3.125 mg by mouth 2  (two) times daily.   chlorthalidone 50 MG tablet Commonly known as: HYGROTON Take 1 tablet (50 mg total) by mouth daily. MUST BE SEEN IN CLINIC FOR FURTHER REFILLS ON MEDICATION   clopidogrel 75 MG tablet Commonly known as: PLAVIX Take 1 tablet (75 mg total) by mouth daily.   empagliflozin 10 MG Tabs tablet Commonly known as: JARDIANCE Take 10 mg by mouth daily.   Fish Oil 1000 MG Caps Take 1,000 mg by mouth daily.   folic acid 1 MG tablet Commonly known as: FOLVITE Take 1 tablet (1 mg total) by mouth daily.   gabapentin 100 MG capsule Commonly known as: NEURONTIN Take 100 mg by mouth 3 (three) times daily.   glipiZIDE 10 MG 24 hr tablet Commonly known as: GLUCOTROL XL Take 20 mg by mouth daily.   insulin degludec 100 UNIT/ML FlexTouch Pen Commonly known as: TRESIBA Inject 30 Units into the skin at bedtime.   multivitamin with minerals Tabs tablet Take 1 tablet by mouth daily.   nicotine 21 mg/24hr patch Commonly known as: NICODERM CQ - dosed in mg/24 hours Place 1 patch (21 mg total) onto the skin daily as needed (nicotine craving).   oxyCODONE-acetaminophen 5-325 MG tablet Commonly known as: PERCOCET/ROXICET Take 1 tablet by mouth every 4 (four) hours as needed for severe pain.   polyethylene glycol 17 g packet Commonly known as: MIRALAX / GLYCOLAX Take 17 g by mouth 2 (two) times daily.   senna-docusate 8.6-50 MG tablet Commonly known as: Senokot-S Take 2 tablets by mouth 2 (two) times daily.   spironolactone 25 MG tablet Commonly known as: ALDACTONE Take 25 mg by mouth daily.   terazosin 2 MG capsule Commonly known as: HYTRIN Take 2 mg by mouth at bedtime.   thiamine 100 MG tablet Commonly known as: Vitamin B-1 Take 1 tablet (100 mg total) by mouth daily.  valsartan 320 MG tablet Commonly known as: DIOVAN Take 160 mg by mouth daily.           If you experience worsening of your admission symptoms, develop shortness of breath, life  threatening emergency, suicidal or homicidal thoughts you must seek medical attention immediately by calling 911 or calling your MD immediately  if symptoms less severe.   You must read complete instructions/literature along with all the possible adverse reactions/side effects for all the medicines you take and that have been prescribed to you. Take any new medicines after you have completely understood and accept all the possible adverse reactions/side effects.    Please note   You were cared for by a hospitalist during your hospital stay. If you have any questions about your discharge medications or the care you received while you were in the hospital after you are discharged, you can call the unit and asked to speak with the hospitalist on call if the hospitalist that took care of you is not available. Once you are discharged, your primary care physician will handle any further medical issues. Please note that NO REFILLS for any discharge medications will be authorized once you are discharged, as it is imperative that you return to your primary care physician (or establish a relationship with a primary care physician if you do not have one) for your aftercare needs so that they can reassess your need for medications and monitor your lab values.       Time coordinating discharge: Greater than 30 minutes  Signed:  Jenelle Drennon  [facility] 10/08/2022, 8:14 PM   Pager on [email or web address]. If 7PM-7AM, please contact night-coverage at [email or web address]

## 2022-10-09 DIAGNOSIS — K922 Gastrointestinal hemorrhage, unspecified: Secondary | ICD-10-CM | POA: Diagnosis not present

## 2022-10-09 LAB — GLUCOSE, CAPILLARY
Glucose-Capillary: 106 mg/dL — ABNORMAL HIGH (ref 70–99)
Glucose-Capillary: 177 mg/dL — ABNORMAL HIGH (ref 70–99)
Glucose-Capillary: 154 mg/dL — ABNORMAL HIGH (ref 70–99)
Glucose-Capillary: 204 mg/dL — ABNORMAL HIGH (ref 70–99)

## 2022-10-09 LAB — CBC
HCT: 25.5 % — ABNORMAL LOW (ref 39.0–52.0)
Hemoglobin: 8.3 g/dL — ABNORMAL LOW (ref 13.0–17.0)
MCH: 29.3 pg (ref 26.0–34.0)
MCHC: 32.5 g/dL (ref 30.0–36.0)
MCV: 90.1 fL (ref 80.0–100.0)
Platelets: 286 K/uL (ref 150–400)
RBC: 2.83 MIL/uL — ABNORMAL LOW (ref 4.22–5.81)
RDW: 13.9 % (ref 11.5–15.5)
WBC: 5.9 K/uL (ref 4.0–10.5)
nRBC: 0 % (ref 0.0–0.2)

## 2022-10-09 MED ORDER — CEPHALEXIN 500 MG PO CAPS
500.0000 mg | ORAL_CAPSULE | Freq: Two times a day (BID) | ORAL | Status: AC
  Administered 2022-10-09 – 2022-10-16 (×14): 500 mg via ORAL
  Filled 2022-10-09 (×14): qty 1

## 2022-10-09 MED ORDER — SILVER SULFADIAZINE 1 % EX CREA: TOPICAL_CREAM | Freq: Two times a day (BID) | CUTANEOUS | 0 refills | Status: DC

## 2022-10-09 MED ORDER — SODIUM CHLORIDE 0.9 % IV SOLN
1.0000 g | INTRAVENOUS | Status: DC
  Administered 2022-10-09: 1 g via INTRAVENOUS
  Filled 2022-10-09: qty 10

## 2022-10-09 MED ORDER — CEPHALEXIN 500 MG PO CAPS: 500.0000 mg | ORAL_CAPSULE | Freq: Two times a day (BID) | ORAL | 1 refills | Status: DC

## 2022-10-09 MED ORDER — SILVER SULFADIAZINE 1 % EX CREA
TOPICAL_CREAM | Freq: Two times a day (BID) | CUTANEOUS | Status: DC
  Filled 2022-10-09: qty 85

## 2022-10-09 NOTE — Progress Notes (Signed)
Patient transferred to 1C in wheelchair, shifted to bed, no signs of distress.

## 2022-10-09 NOTE — Progress Notes (Signed)
Progress Note    10/09/2022 11:33 PM 6 Days Post-Op  Subjective:  [name] is a 77 year old male with a history of hypertension, hyperlipidemia, tobacco use, diabetes mellitus 2 non-insulin-dependent, PAD now status post bilateral femoral endarterectomies with bilateral iliac stent placement on 09/05/2022.  He is also down to have a right lower extremity DVT.   Patient presented to [facility] emergency department with generalized weakness and hypotension.  She was diagnosed with acute on chronic anemia and GI team was consulted.  On 09/30/22 he underwent EGD which showed gastritis/duodenitis and an esophageal ulcer without any stigmata of recent bleeding.  Patient had endorsed black tarry stools prior to coming to the emergency room.  He also endorsed that he went to see his general practitioner and the placed him on Eliquis 5 mg twice daily for DVT treatment.    On exam this afternoon patient is resting comfortably in bed.  Bilateral groins continue to have staples which will be taken out today.  Patient endorses some soreness to bilateral groins.  Patient denies any shortness of breath chest pain or stomach pain.  No complaints overnight vitals all remained stable.   Vitals:   10/09/22 2220 10/09/22 2253  BP: (!) 144/51 (!) 151/56  Pulse: 72 62  Resp:  18  Temp:  98.4 F (36.9 C)  SpO2: 97% 97%   Physical Exam: Cardiac:  RRR, Normal S1 and S2, No gallpos, clicks or rubs. Lungs: Normal nonlabored breathing, without rales rhonchi or wheezing.  Clear throughout on auscultation. Incisions: Left groin staples removed today.  Incision line remain this erythemic and warm to touch.  Right groin staples removed today.  No signs or symptoms of infection hematoma seroma to note. Extremities: The lower extremities with palpable pulses.  Bilateral lower extremities warm to touch. Abdomen: Positive bowel sounds, soft, nontender and nondistended  Neurologic: Alert and oriented x 3, follows all commands  appropriately, answers all questions appropriately.  CBC    Component Value Date/Time   WBC 5.9 10/09/2022 0921   RBC 2.83 (L) 10/09/2022 0921   HGB 8.3 (L) 10/09/2022 0921   HGB 14.0 06/13/2019 1612   HCT 25.5 (L) 10/09/2022 0921   HCT 41.5 06/13/2019 1612   PLT 286 10/09/2022 0921   PLT 258 06/13/2019 1612   MCV 90.1 10/09/2022 0921   MCV 92 06/13/2019 1612   MCH 29.3 10/09/2022 0921   MCHC 32.5 10/09/2022 0921   RDW 13.9 10/09/2022 0921   RDW 12.8 06/13/2019 1612   LYMPHSABS 4.3 (H) 09/29/2022 1457   LYMPHSABS 2.1 06/13/2019 1612   MONOABS 1.0 09/29/2022 1457   EOSABS 1.5 (H) 09/29/2022 1457   EOSABS 0.6 (H) 06/13/2019 1612   BASOSABS 0.1 09/29/2022 1457   BASOSABS 0.1 06/13/2019 1612    BMET    Component Value Date/Time   NA 138 10/07/2022 0548   NA 134 06/18/2020 1556   K 4.5 10/07/2022 0548   CL 105 10/07/2022 0548   CO2 25 10/07/2022 0548   GLUCOSE 134 (H) 10/07/2022 0548   BUN 19 10/07/2022 0548   BUN 21 06/18/2020 1556   CREATININE 1.15 10/07/2022 0548   CALCIUM 8.5 (L) 10/07/2022 0548   GFRNONAA >60 10/07/2022 0548   GFRAA 66 06/18/2020 1556    INR    Component Value Date/Time   INR 1.3 (H) 09/29/2022 1827     Intake/Output Summary (Last 24 hours) at 10/09/2022 2333 Last data filed at 10/09/2022 1948 Gross per 24 hour  Intake 580 ml  Output --  Net 580 ml     Assessment/Plan:  77 y.o. male is s/p bilateral femoral endarterectomy with bilateral iliac stent placement on 09/05/2022  6 Days Post-Op   Patient is recovering as expected from prior vascular surgery. I was called today to evaluate patients left groin area today. It continues to be reddened but without drainage. Antibiotics have been ordered per pharmacy's nomogram for wound care. Those will be started today.  As for the patients GI bleeding, he had a capsule study which was positive and therefore due to the location will need a balloon enteroscopy, which is not done here at [facility]. Patient was  accepted at [facility] for the procedure to be done and then return to [facility]. Still awaiting scheduling of this procedure according to the patient and prior notes.   DVT prophylaxis:  ASA 81 mg daily and Plavix 75 mg daily   Marcie [facility] 10/09/2022 11:33 PM

## 2022-10-09 NOTE — Progress Notes (Signed)
Incision on patient's left groin was open and yellow in the lower end, also found to be draining some serous fluid while dressing change, MD made aware of, they said will have a look at it.

## 2022-10-09 NOTE — Progress Notes (Signed)
Progress Note    [name]  Ball:[id number] DOB: 17-Sep-1945  DOA: 09/29/2022 PCP: Luciana Axe, NP      Brief Narrative:    Medical records reviewed and are as summarized below:  William Ball is a 77 y.o. male  with history of hypertension, hyperlipidemia, tobacco use, non-insulin-dependent diabetes mellitus, constipation, PAD status post bilateral femoral enterectomy and bilateral iliac stents placed on 09/05/2022, history of occlusive right peroneal vein DVT, who presents emergency department for chief concerns of hypotension and weakness.  He also reported 3 days of melena.  He takes aspirin, Plavix and Eliquis at home.   Patient was noted to have hemoglobin of 9.8, started on PPI, given antibiotics in the ED. Patient was diagnosed with acute on chronic anemia. GI team consulted. EGD 5/28 showed gastritis/duodenitis and esophageal ulcer without any stigmata of recent bleeding. GI recommended PPI. Seen by vascular team and for now discontinue Eliquis as patient will not need DVT treatment but will need to continue aspirin and Plavix. Hemoglobin slowly drifted down therefore received 1 unit of PRBC.  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  [facility] and [facility] and not capable of performing balloon enteroscopy so transferred to [facility] for further management was recommended.  He has been accepted at [facility] for procedure to be performed and patient will likely be transferred back to [facility] after procedure is completed.       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   Upper GI bleed: s/p EGD which showed gastritis, duodenitis, & esophageal ulcer w/o any stigmata of recent bleeding. 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 No balloon enteroscopy available here or [facility] so patient has been transferred to [facility] for further management.  Awaiting hide assignments.     Acute blood loss anemia: on anticoagulation. Likely secondary to gastritis, duodenitis & esophageal ulcer. S/p 1 unit of pRBCs transfused so far. H&H is stable.  Hemoglobin up from 8-8.3.   Right ankle pain: XR shows soft tissue swelling but no fracture.    Type II DM: Continue insulin glargine and NovoLog as needed.  HbA1c 8.0   Peripheral neuropathy: continue on home dose of gabapentin    HTN: continue on coreg    DVT: of right peroneal vein, persitent. Hx of PAD s/p bilateral femoral endarterectomy with bilateral iliac stent placement 09/10/22. Continue on aspirin, plavix & d/c eliquis as per vasc surg. No need for eliquis anymore as per vasc surg    Sciatica nerve pain of RLE: completed  prednisone taper    Cellulitis of left groin: w/ possible small groin hematoma. Completed 7 days of IV antibiotics (initially received vancomycin followed by ceftriaxone and was eventually switched toIV cefazolin) Reengaged vascular surgeon (Dr. Gilda Crease) and Huel Cote, NP( on 10/09/2022) because of drainage from left groin surgical wound. He has been started on IV Ceftriaxone by vascular surgeon.    AKI: Cr has improved.   Avoid nephrotoxic meds    Overweight: BMI 25.3. Would benefit from weight loss    Hyponatremia: resolved    BPH: Continue Terazosin   HLD: continue on statin           Diet Order             Diet - low sodium heart healthy           Diet Carb Modified           Diet Carb Modified Fluid consistency: Thin  Diet effective now                            Consultants: Gastroenterologist Vascular surgeon  Procedures: EGD on 09/30/2022 Video capsule endoscopy on 10/08/2022    Medications:    sodium chloride   Intravenous Once   aspirin EC  81 mg Oral Daily   atorvastatin  40 mg Oral QHS   carvedilol  3.125 mg Oral BID   clopidogrel  75 mg Oral Daily   folic acid  1 mg Oral Daily   gabapentin  100 mg Oral TID   insulin aspart  0-5 Units Subcutaneous QHS   insulin aspart  0-9 Units Subcutaneous TID WC   insulin glargine-yfgn  30 Units Subcutaneous Daily   multivitamin with minerals  1 tablet Oral Daily   pantoprazole  40 mg Oral BID   senna-docusate  2 tablet Oral BID   terazosin  2 mg Oral QHS   thiamine  100 mg Oral Daily   Continuous Infusions:  cefTRIAXone (ROCEPHIN)  IV Stopped (10/09/22 1502)     Anti-infectives (From admission, onward)    Start     Dose/Rate Route Frequency Ordered Stop   10/09/22 1400  cefTRIAXone (ROCEPHIN) 1 g in sodium chloride 0.9 % 100 mL IVPB        1 g 200 mL/hr over 30 Minutes Intravenous Every 24 hours 10/09/22 1229 10/16/22 1359   09/30/22 2200  ceFAZolin (ANCEF) IVPB 2g/100 mL premix        2 g 200 mL/hr over 30 Minutes Intravenous Every 8 hours 09/30/22 1420 10/05/22 2223   09/30/22 1000  cefTRIAXone (ROCEPHIN) 1 g in sodium chloride 0.9 % 100 mL IVPB  Status:  Discontinued        1 g 200 mL/hr over 30 Minutes Intravenous Every 24 hours 09/29/22 1757 09/30/22 1417   09/29/22 2200  vancomycin (VANCOCIN) IVPB 1000 mg/200 mL premix        1,000 mg 200 mL/hr over 60 Minutes Intravenous  Once 09/29/22 2151  09/29/22 2350   09/29/22 2151  vancomycin variable dose per unstable renal function (pharmacist dosing)  Status:  Discontinued         Does not apply See admin instructions 09/29/22 2151 09/30/22 1416   09/29/22 1715  vancomycin (VANCOCIN) IVPB 1000 mg/200 mL premix        1,000 mg 200 mL/hr over 60 Minutes Intravenous  Once 09/29/22 1707 09/29/22 2003   09/29/22 1715  cefTRIAXone (ROCEPHIN) 2 g in sodium chloride  0.9 % 100 mL IVPB        2 g 200 mL/hr over 30 Minutes Intravenous  Once 09/29/22 1707 09/29/22 2003              Family Communication/Anticipated D/C date and plan/Code Status   DVT prophylaxis: Place and maintain sequential compression device Start: 10/04/22 1450 Place TED hose Start: 09/29/22 1736     Code Status: Full Code  Family Communication: None Disposition Plan: Plan to transfer to [facility]    Status is: Inpatient Remains inpatient appropriate because: Awaiting transfer to [facility]       Subjective:   Interval events noted. He complains of drainage from left groin incisional wound.   Objective:    Vitals:   10/09/22 0814 10/09/22 0820 10/09/22 1216 10/09/22 1538  BP: (!) 164/52 (!) 164/52 (!) 136/50 (!) 171/61  Pulse: (!) 52 (!) 52 (!) 58 (!) 52  Resp: 18 18 18 16   Temp: 98.6 F (37 C) 98.6 F (37 C) 98.5 F (36.9 C) 98.5 F (36.9 C)  TempSrc:  Oral    SpO2: 100% 100% 100% 100%  Weight:      Height:       No data found.   Intake/Output Summary (Last 24 hours) at 10/09/2022 1709 Last data filed at 10/09/2022 1505 Gross per 24 hour  Intake 340 ml  Output --  Net 340 ml   Filed Weights   09/29/22 1454 09/30/22 1424  Weight: 81.2 kg 77.8 kg    Exam:  GEN: NAD SKIN: Warm and dry.  Incisional wound in the right groin is clean, dry and intact.  However, incisional wound in the left groin is slight dehisced and dressing was wet. No tenderness or erythema at wound site.  EYES: No pallor or icterus ENT: MMM CV:  RRR PULM: CTA B ABD: soft, ND, NT, +BS CNS: AAO x 3, non focal EXT: No edema or tenderness        Data Reviewed:   I have personally reviewed following labs and imaging studies:  Labs: Labs show the following:   Basic Metabolic Panel: Recent Labs  Lab 10/03/22 0506 10/04/22 0558 10/05/22 0542 10/06/22 0528 10/07/22 0548  NA 136 138 137 137 138  K 4.2 4.3 4.1 4.1 4.5  CL 107 109 107 107 105  CO2 22 23 23 24 25   GLUCOSE 70 54* 78 86 134*  BUN 34* 20 14 20 19   CREATININE 1.30* 1.26* 1.17 1.22 1.15  CALCIUM 8.6* 8.6* 8.3* 8.4* 8.5*  MG 1.8 1.7 1.9 1.8 1.9   GFR Estimated Creatinine Clearance: 53.8 mL/min (by C-G formula based on SCr of 1.15 mg/dL). Liver Function Tests: No results for input(s): "AST", "ALT", "ALKPHOS", "BILITOT", "PROT", "ALBUMIN" in the last 168 hours. No results for input(s): "LIPASE", "AMYLASE" in the last 168 hours. No results for input(s): "AMMONIA" in the last 168 hours. Coagulation profile No results for input(s): "INR", "PROTIME" in the last 168 hours.  CBC: Recent Labs  Lab 10/04/22 0558 10/05/22 0542 10/06/22 0528 10/07/22 0548 10/09/22 0921  WBC 6.5 6.6 6.9 7.4 5.9  HGB 8.5* 7.9* 8.2* 8.0* 8.3*  HCT 25.1* 23.6* 24.7* 24.2* 25.5*  MCV 89.0 88.7 89.8 89.6 90.1  PLT 260 229 245 266 286   Cardiac Enzymes: No results for input(s): "CKTOTAL", "CKMB", "CKMBINDEX", "TROPONINI" in the last 168 hours. BNP (last 3 results) No results for input(s): "PROBNP" in the last 8760 hours. CBG: Recent Labs  Lab 10/08/22 1640 10/08/22  2148 10/09/22 0814 10/09/22 1215 10/09/22 1705  GLUCAP 190* 202* 106* 177* 154*   D-Dimer: No results for input(s): "DDIMER" in the last 72 hours. Hgb A1c: No results for input(s): "HGBA1C" in the last 72 hours. Lipid Profile: No results for input(s): "CHOL", "HDL", "LDLCALC", "TRIG", "CHOLHDL", "LDLDIRECT" in the last 72 hours. Thyroid function studies: No results for input(s): "TSH", "T4TOTAL",  "T3FREE", "THYROIDAB" in the last 72 hours.  Invalid input(s): "FREET3" Anemia work up: No results for input(s): "VITAMINB12", "FOLATE", "FERRITIN", "TIBC", "IRON", "RETICCTPCT" in the last 72 hours. Sepsis Labs: Recent Labs  Lab 10/05/22 0542 10/06/22 0528 10/07/22 0548 10/09/22 0921  WBC 6.6 6.9 7.4 5.9    Microbiology Recent Results (from the past 240 hour(s))  Blood culture (routine x 2)     Status: None   Collection Time: 09/29/22  6:32 PM   Specimen: BLOOD  Result Value Ref Range Status   Specimen Description BLOOD LEFT ANTECUBITAL  Final   Special Requests   Final    BOTTLES DRAWN AEROBIC AND ANAEROBIC Blood Culture adequate volume   Culture   Final    NO GROWTH 5 DAYS Performed at [facility], [address] [place], [place] [address]    Report Status 10/04/2022 FINAL  Final  Blood culture (routine x 2)     Status: None   Collection Time: 09/29/22  6:32 PM   Specimen: BLOOD  Result Value Ref Range Status   Specimen Description BLOOD BLOOD LEFT FOREARM  Final   Special Requests   Final    BOTTLES DRAWN AEROBIC AND ANAEROBIC Blood Culture results may not be optimal due to an excessive volume of blood received in culture bottles   Culture   Final    NO GROWTH 5 DAYS Performed at [facility], [address] [place], [place] [address]    Report Status 10/04/2022 FINAL  Final  SARS Coronavirus 2 by RT PCR (hospital order, performed in [facility] hospital lab) *cepheid single result test* Anterior Nasal Swab     Status: None   Collection Time: 10/07/22  9:35 AM   Specimen: Anterior Nasal Swab  Result Value Ref Range Status   SARS Coronavirus 2 by RT PCR NEGATIVE NEGATIVE Final    Comment: (NOTE) SARS-CoV-2 target nucleic acids are NOT DETECTED.  The SARS-CoV-2 RNA is generally detectable in upper and lower respiratory specimens during the acute phase of infection. The lowest concentration of SARS-CoV-2 viral copies this assay can detect is  250 copies / mL. A negative result does not preclude SARS-CoV-2 infection and should not be used as the sole basis for treatment or other patient management decisions.  A negative result may occur with improper specimen collection / handling, submission of specimen other than nasopharyngeal swab, presence of viral mutation(s) within the areas targeted by this assay, and inadequate number of viral copies (<250 copies / mL). A negative result must be combined with clinical observations, patient history, and epidemiological information.  Fact Sheet for Patients:   RoadLapTop.co.za  Fact Sheet for Healthcare Providers: http://kim-miller.com/  This test is not yet approved or  cleared by the [place] FDA and has been authorized for detection and/or diagnosis of SARS-CoV-2 by FDA under an Emergency Use Authorization (EUA).  This EUA will remain in effect (meaning this test can be used) for the duration of the COVID-19 declaration under Section 564(b)(1) of the Act, 21 U.S.C. section 360bbb-3(b)(1), unless the authorization is terminated or revoked sooner.  Performed at [facility], [address], [place], [place] [address]     Procedures and diagnostic studies:  No results found.             LOS: 10 days   Saryna Kneeland  [facility]   Pager on [email or web address]. If 7PM-7AM, please contact night-coverage at [email or web address]     10/09/2022, 5:09 PM

## 2022-10-09 NOTE — Progress Notes (Signed)
Asked to see patient regarding left groin incision.  Left groin has a small area of skin dehiscence in the inferior aspect of the incision.  There is no drainage the gauze dressing that is in place is completely dry.  There is no significant erythema or discharge.  This does not appear to be infected.  However, the patient does have a prosthetic patch that was placed and therefore I recommend oral Keflex for 7 days.  The wound can be treated topically with Silvadene cream twice a day and a light gauze dressing.  Patient is okay to shower.  No tub baths or soaking in water.  Hopefully I have correctly placed these 2 orders in for discharge.  We can move forward discharge.

## 2022-10-09 NOTE — Discharge Summary (Addendum)
Physician Discharge Summary   Patient: William Ball MRN: [id number] DOB: Mar 22, 1946  Admit date:     09/29/2022  Discharge date: 10/09/22  Discharge Physician: Lurene Shadow   PCP: Luciana Axe, NP   Recommendations at discharge:   Follow up with PCP in 1-2 weeks F/u w/ GI, Dr. Servando Snare, in 1-2 weeks Follow-up with Dr. 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 [facility] Course:  William Ball is a 77 year old male with history of hypertension, hyperlipidemia, tobacco use, non-insulin-dependent diabetes mellitus, constipation, PAD status post bilateral femoral enterectomy and bilateral iliac stents placed on 09/05/2022, history of occlusive right peroneal vein DVT, who presents emergency department for chief concerns of hypotension and weakness.  He also reported 3 days of melena.  He takes aspirin, Plavix and Eliquis at home.   Patient was noted to have hemoglobin of 9.8, started on PPI, given antibiotics in the ED. Patient was diagnosed with acute on chronic anemia. GI team consulted. EGD 5/28 showed gastritis/duodenitis and esophageal ulcer without any stigmata of recent bleeding. GI recommended PPI. Seen by vascular team and for now discontinue Eliquis as patient will not need DVT  treatment but will need to continue aspirin and Plavix. Hemoglobin slowly drifted down therefore received 1 unit of PRBC.  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  [facility] and not capable of performing balloon enteroscopy so transferred to [facility] for further management was recommended.  He has been accepted at [facility] for procedure to be performed and patient will likely be transferred back to [facility] after procedure is completed.    Assessment and Plan:   Upper GI bleed: s/p EGD which showed gastritis, duodenitis, & esophageal ulcer w/o any stigmata of recent bleeding. 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 No balloon enteroscopy available here or [facility] so patient has been transferred to [facility] for further management.  Awaiting hide assignments.     Acute blood loss anemia: on anticoagulation. Likely secondary to gastritis, duodenitis & esophageal ulcer. S/p 1 unit of pRBCs transfused so far. H&H is stable.  Hemoglobin up from 8-8.3.   Right ankle pain: XR shows soft tissue swelling but no fracture.    Type II DM: Continue insulin glargine and NovoLog as needed.  HbA1c 8.0   Peripheral neuropathy: continue on home dose of gabapentin    HTN: continue on coreg    DVT: of right peroneal vein, persitent. Hx of PAD s/p bilateral femoral endarterectomy with bilateral iliac  stent placement 09/10/22. Continue on aspirin, plavix & d/c eliquis as per vasc surg. No need for eliquis anymore as per vasc surg    Sciatica nerve pain of RLE: completed prednisone taper    Cellulitis of left groin: w/ possible small groin hematoma. Completed 7 days of IV antibiotics (initially received vancomycin followed by ceftriaxone and was  eventually switched toIV cefazolin) Reengaged vascular surgeon (Dr. Gilda Crease) and Huel Cote, NP( on 10/09/2022) because of drainage from left groin surgical wound. He has been started on IV Ceftriaxone by vascular surgeon.    AKI: Cr has improved.  Avoid nephrotoxic meds    Overweight: BMI 25.3. Would benefit from weight loss    Hyponatremia: resolved    BPH: Continue Terazosin   HLD: continue on statin                  Consultants: Gastroenterologist, vascular surgeon Procedures performed: EGD, video capsule endoscopy Disposition:  Plan to transfer to [facility] Diet recommendation:  Discharge Diet Orders (From admission, onward)     Start     Ordered   10/06/22 0000  Diet - low sodium heart healthy        10/06/22 1716   10/06/22 0000  Diet Carb Modified        10/06/22 1716           Cardiac and Carb modified diet DISCHARGE MEDICATION: Allergies as of 10/09/2022       Reactions   Amlodipine Itching        Medication List     STOP taking these medications    apixaban 5 MG Tabs tablet Commonly known as: ELIQUIS       TAKE these medications    albuterol 108 (90 Base) MCG/ACT inhaler Commonly known as: VENTOLIN HFA Inhale 1-2 puffs into the lungs every 6 (six) hours as needed for wheezing or shortness of breath.   aspirin EC 81 MG tablet Take 81 mg by mouth daily.   atorvastatin 40 MG tablet Commonly known as: LIPITOR Take 40 mg by mouth daily.   carvedilol 6.25 MG tablet Commonly known as: COREG Take 3.125 mg by mouth 2 (two) times daily.   chlorthalidone 50 MG tablet Commonly known as: HYGROTON Take 1 tablet (50 mg total) by mouth daily. MUST BE SEEN IN CLINIC FOR FURTHER REFILLS ON MEDICATION   clopidogrel 75 MG tablet Commonly known as: PLAVIX Take 1 tablet (75 mg total) by mouth daily.   empagliflozin 10 MG Tabs tablet Commonly known as: JARDIANCE Take 10 mg by mouth daily.   Fish Oil 1000 MG Caps Take 1,000 mg by mouth daily.    folic acid 1 MG tablet Commonly known as: FOLVITE Take 1 tablet (1 mg total) by mouth daily.   gabapentin 100 MG capsule Commonly known as: NEURONTIN Take 100 mg by mouth 3 (three) times daily.   glipiZIDE 10 MG 24 hr tablet Commonly known as: GLUCOTROL XL Take 20 mg by mouth daily.   insulin degludec 100 UNIT/ML FlexTouch Pen Commonly known as: TRESIBA Inject 30 Units into the skin at bedtime.   multivitamin with minerals Tabs tablet Take 1 tablet by mouth daily.   nicotine 21 mg/24hr patch Commonly known as: NICODERM CQ - dosed in mg/24 hours Place 1 patch (21 mg total) onto the skin daily as needed (nicotine craving).   oxyCODONE-acetaminophen 5-325 MG tablet Commonly known as: PERCOCET/ROXICET Take 1 tablet by mouth every 4 (four) hours as needed for severe pain.   polyethylene  glycol 17 g packet Commonly known as: MIRALAX / GLYCOLAX Take 17 g by mouth 2 (two) times daily.   senna-docusate 8.6-50 MG tablet Commonly known as: Senokot-S Take 2 tablets by mouth 2 (two) times daily.   spironolactone 25 MG tablet Commonly known as: ALDACTONE Take 25 mg by mouth daily.   terazosin 2 MG capsule Commonly known as: HYTRIN Take 2 mg by mouth at bedtime.   thiamine 100 MG tablet Commonly known as: Vitamin B-1 Take 1 tablet (100 mg total) by mouth daily.   valsartan 320 MG tablet Commonly known as: DIOVAN Take 160 mg by mouth daily.        Discharge Exam: Filed Weights   09/29/22 1454 09/30/22 1424  Weight: 81.2 kg 77.8 kg   GEN: NAD SKIN: Warm and dry.  Incisional wound in the right groin is clean, dry and intact.  However, incisional wound in the left groin is slight dehisced and dressing was wet. No tenderness or erythema at wound site.  EYES: No pallor or icterus ENT: MMM CV: RRR PULM: CTA B ABD: soft, ND, NT, +BS CNS: AAO x 3, non focal EXT: No edema or tenderness    Condition at discharge: good  The results of significant diagnostics from this  hospitalization (including imaging, microbiology, ancillary and laboratory) are listed below for reference.   Imaging Studies: DG Ankle 2 Views Right  Result Date: 09/29/2022 CLINICAL DATA:  [id number] with ankle pain and purulent cellulitis. EXAM: RIGHT ANKLE - 2 VIEW COMPARISON:  None Available. FINDINGS: There is no evidence of fracture, dislocation, or joint effusion. There is no evidence of arthropathy or acute focal bone abnormality. Moderate posterior and plantar calcaneal enthesopathic spurring is noted. Mild anterior soft tissue fullness is noted with otherwise unremarkable soft tissues. IMPRESSION: 1. Mild anterior soft tissue fullness without underlying osseous abnormality. 2. Moderate posterior and plantar calcaneal enthesopathic spurring. Electronically Signed   By: Almira Bar M.D.   On: 09/29/2022 22:13   [place] Lower Ext Art Left Ltd  Result Date: 09/29/2022 CLINICAL DATA:  Recent endovascular stent placement. Redness and drainage at incision site in left groin. EXAM: LEFT LOWER EXTREMITY ARTERIAL DUPLEX SCAN TECHNIQUE: Gray-scale sonography as well as color Doppler and duplex ultrasound was performed to evaluate the left common femoral artery, superficial femoral artery and profunda femoral artery in the area of concern COMPARISON:  None Available. FINDINGS: Left lower Extremity No pseudoaneurysm seen. No visible AV fistula. Hypoechoic material is seen in the left groin adjacent to the vessels and in the left groin, likely small hematoma. Area in the left groin measures approximately 1 cm in maximum diameter. IMPRESSION: No evidence of pseudoaneurysm or AV fistula. Probable small left groin hematoma. Electronically Signed   By: Charlett Nose M.D.   On: 09/29/2022 21:06   US Venous Img Lower Bilateral  Result Date: 09/29/2022 CLINICAL DATA:  Leg swelling.  Follow-up DVT. EXAM: BILATERAL LOWER EXTREMITY VENOUS DOPPLER ULTRASOUND TECHNIQUE: Gray-scale sonography with graded compression, as well  as color Doppler and duplex ultrasound were performed to evaluate the lower extremity deep venous systems from the level of the common femoral vein and including the common femoral, femoral, profunda femoral, popliteal and calf veins including the posterior tibial, peroneal and gastrocnemius veins when visible. The superficial great saphenous vein was also interrogated. Spectral Doppler was utilized to evaluate flow at rest and with distal augmentation maneuvers in the common femoral, femoral and popliteal veins. COMPARISON:  Right lower extremity duplex 09/22/2022. FINDINGS: RIGHT LOWER  EXTREMITY Common Femoral Vein: No evidence of thrombus. Normal compressibility, respiratory phasicity and response to augmentation. Saphenofemoral Junction: No evidence of thrombus. Normal compressibility and flow on color Doppler imaging. Profunda Femoral Vein: No evidence of thrombus. Normal compressibility and flow on color Doppler imaging. Femoral Vein: No evidence of thrombus. Normal compressibility, respiratory phasicity and response to augmentation. Popliteal Vein: No evidence of thrombus. Normal compressibility, respiratory phasicity and response to augmentation. Calf Veins: Again seen occlusion of the peroneal vein. Posterior tibial vein is patent. Superficial Great Saphenous Vein: No evidence of thrombus. Normal compressibility. Venous Reflux:  None. Other Findings:  None. LEFT LOWER EXTREMITY Common Femoral Vein: No evidence of thrombus. Normal compressibility, respiratory phasicity and response to augmentation. Saphenofemoral Junction: No evidence of thrombus. Normal compressibility and flow on color Doppler imaging. Profunda Femoral Vein: No evidence of thrombus. Normal compressibility and flow on color Doppler imaging. Femoral Vein: No evidence of thrombus. Normal compressibility, respiratory phasicity and response to augmentation. Popliteal Vein: No evidence of thrombus. Normal compressibility, respiratory phasicity  and response to augmentation. Calf Veins: No evidence of thrombus. Normal compressibility and flow on color Doppler imaging. Superficial Great Saphenous Vein: No evidence of thrombus. Normal compressibility. Venous Reflux:  None. Other Findings:  None. IMPRESSION: 1. 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 2. No left lower extremity DVT. Electronically Signed   By: Narda Rutherford M.D.   On: 09/29/2022 19:28   DG Chest Port 1 View  Result Date: 09/29/2022 CLINICAL DATA:  Intractable nausea and vomiting. Weakness and hypotension. EXAM: PORTABLE CHEST 1 VIEW COMPARISON:  Chest radiographs 01/27/2017, CT abdomen and pelvis 01/27/2017 FINDINGS: Moderate atherosclerotic calcifications within the aortic arch. A benign calcified granuloma again overlies the inferolateral left lung, also seen on prior CT. The lungs are clear. No pleural effusion or pneumothorax. No acute skeletal abnormality. IMPRESSION: No acute cardiopulmonary disease process. Electronically Signed   By: Neita Garnet M.D.   On: 09/29/2022 18:29   US Venous Img Lower Unilateral Right (DVT)  Result Date: 09/22/2022 CLINICAL DATA:  Right lower extremity edema and erythema. Status post bilateral femoral surgical endarterectomy and iliac stent placement on 09/10/2022 EXAM: RIGHT LOWER EXTREMITY VENOUS DOPPLER ULTRASOUND TECHNIQUE: Gray-scale sonography with graded compression, as well as color Doppler and duplex ultrasound were performed to evaluate the lower extremity deep venous systems from the level of the common femoral vein and including the common femoral, femoral, profunda femoral, popliteal and calf veins including the posterior tibial, peroneal and gastrocnemius veins when visible. The superficial great saphenous vein was also interrogated. Spectral Doppler was utilized to evaluate flow at rest and with distal augmentation maneuvers in the common femoral, femoral and popliteal veins.  COMPARISON:  None Available. FINDINGS: Contralateral Common Femoral Vein: Respiratory phasicity is normal and symmetric with the symptomatic side. No evidence of thrombus. Normal compressibility. Common Femoral Vein: No evidence of thrombus. Normal compressibility, respiratory phasicity and response to augmentation. Saphenofemoral Junction: No evidence of thrombus. Normal compressibility and flow on color Doppler imaging. Profunda Femoral Vein: No evidence of thrombus. Normal compressibility and flow on color Doppler imaging. Femoral Vein: No evidence of thrombus. Normal compressibility, respiratory phasicity and response to augmentation. Popliteal Vein: No evidence of thrombus. Normal compressibility, respiratory phasicity and response to augmentation. Calf Veins: Posterior tibial vein demonstrates normal patency. There is a segment of visualized peroneal vein thrombus that appears to be occlusive. Superficial Great Saphenous Vein: No evidence of thrombus. Normal compressibility. Venous Reflux:  None.  Other Findings: No evidence of superficial thrombophlebitis. Focal hypoechoic fluid collection in the right groin measures roughly 4 x 2 x 4 cm and is consistent with postoperative seroma or hematoma. IMPRESSION: 1. Segmental occlusive DVT in the right peroneal vein. 2. Focal fluid collection in the right groin measuring 4 x 2 x 4 cm is consistent with postoperative seroma or hematoma. Electronically Signed   By: Irish Lack M.D.   On: 09/22/2022 14:10   DG C-Arm 1-60 Min-No Report  Result Date: 09/10/2022 Fluoroscopy was utilized by the requesting physician.  No radiographic interpretation.    Microbiology: Results for orders placed or performed during the hospital encounter of 09/29/22  Blood culture (routine x 2)     Status: None   Collection Time: 09/29/22  6:32 PM   Specimen: BLOOD  Result Value Ref Range Status   Specimen Description BLOOD LEFT ANTECUBITAL  Final   Special Requests   Final     BOTTLES DRAWN AEROBIC AND ANAEROBIC Blood Culture adequate volume   Culture   Final    NO GROWTH 5 DAYS Performed at [facility], [address] [place], [place] [address]    Report Status 10/04/2022 FINAL  Final  Blood culture (routine x 2)     Status: None   Collection Time: 09/29/22  6:32 PM   Specimen: BLOOD  Result Value Ref Range Status   Specimen Description BLOOD BLOOD LEFT FOREARM  Final   Special Requests   Final    BOTTLES DRAWN AEROBIC AND ANAEROBIC Blood Culture results may not be optimal due to an excessive volume of blood received in culture bottles   Culture   Final    NO GROWTH 5 DAYS Performed at [facility], [address] [place], [place] [address]    Report Status 10/04/2022 FINAL  Final  SARS Coronavirus 2 by RT PCR (hospital order, performed in [facility] hospital lab) *cepheid single result test* Anterior Nasal Swab     Status: None   Collection Time: 10/07/22  9:35 AM   Specimen: Anterior Nasal Swab  Result Value Ref Range Status   SARS Coronavirus 2 by RT PCR NEGATIVE NEGATIVE Final    Comment: (NOTE) SARS-CoV-2 target nucleic acids are NOT DETECTED.  The SARS-CoV-2 RNA is generally detectable in upper and lower respiratory specimens during the acute phase of infection. The lowest concentration of SARS-CoV-2 viral copies this assay can detect is 250 copies / mL. A negative result does not preclude SARS-CoV-2 infection and should not be used as the sole basis for treatment or other patient management decisions.  A negative result may occur with improper specimen collection / handling, submission of specimen other than nasopharyngeal swab, presence of viral mutation(s) within the areas targeted by this assay, and inadequate number of viral copies (<250 copies / mL). A negative result must be combined with clinical observations, patient history, and epidemiological information.  Fact Sheet for Patients:    RoadLapTop.co.za  Fact Sheet for Healthcare Providers: http://kim-miller.com/  This test is not yet approved or  cleared by the [place] FDA and has been authorized for detection and/or diagnosis of SARS-CoV-2 by FDA under an Emergency Use Authorization (EUA).  This EUA will remain in effect (meaning this test can be used) for the duration of the COVID-19 declaration under Section 564(b)(1) of the Act, 21 U.S.C. section 360bbb-3(b)(1), unless the authorization is terminated or revoked sooner.  Performed at [facility], [address] [place], [place] [address]  Labs: CBC: Recent Labs  Lab 10/04/22 0558 10/05/22 0542 10/06/22 0528 10/07/22 0548 10/09/22 0921  WBC 6.5 6.6 6.9 7.4 5.9  HGB 8.5* 7.9* 8.2* 8.0* 8.3*  HCT 25.1* 23.6* 24.7* 24.2* 25.5*  MCV 89.0 88.7 89.8 89.6 90.1  PLT 260 229 245 266 286   Basic Metabolic Panel: Recent Labs  Lab 10/03/22 0506 10/04/22 0558 10/05/22 0542 10/06/22 0528 10/07/22 0548  NA 136 138 137 137 138  K 4.2 4.3 4.1 4.1 4.5  CL 107 109 107 107 105  CO2 22 23 23 24 25   GLUCOSE 70 54* 78 86 134*  BUN 34* 20 14 20 19   CREATININE 1.30* 1.26* 1.17 1.22 1.15  CALCIUM 8.6* 8.6* 8.3* 8.4* 8.5*  MG 1.8 1.7 1.9 1.8 1.9   Liver Function Tests: No results for input(s): "AST", "ALT", "ALKPHOS", "BILITOT", "PROT", "ALBUMIN" in the last 168 hours. CBG: Recent Labs  Lab 10/08/22 1640 10/08/22 2148 10/09/22 0814 10/09/22 1215 10/09/22 1705  GLUCAP 190* 202* 106* 177* 154*    Discharge time spent: greater than 30 minutes.  Signed: Lurene Shadow, MD [facility] 10/09/2022

## 2022-10-10 DIAGNOSIS — K922 Gastrointestinal hemorrhage, unspecified: Secondary | ICD-10-CM | POA: Diagnosis not present

## 2022-10-10 LAB — GLUCOSE, CAPILLARY
Glucose-Capillary: 86 mg/dL (ref 70–99)
Glucose-Capillary: 158 mg/dL — ABNORMAL HIGH (ref 70–99)
Glucose-Capillary: 153 mg/dL — ABNORMAL HIGH (ref 70–99)
Glucose-Capillary: 193 mg/dL — ABNORMAL HIGH (ref 70–99)

## 2022-10-10 NOTE — Progress Notes (Addendum)
Progress Note    William Ball  ZOX:[id number] DOB: 1945-05-21  DOA: 09/29/2022 PCP: Luciana Axe, NP      Brief Narrative:    Medical records reviewed and are as summarized below:  William Ball is a 77 y.o. male  with history of hypertension, hyperlipidemia, tobacco use, non-insulin-dependent diabetes mellitus, constipation, PAD status post bilateral femoral enterectomy and bilateral iliac stents placed on 09/05/2022, history of occlusive right peroneal vein DVT, who presents emergency department for chief concerns of hypotension and weakness.  He also reported 3 days of melena.  He takes aspirin, Plavix and Eliquis at home.   Patient was noted to have hemoglobin of 9.8, started on PPI, given antibiotics in the ED. Patient was diagnosed with acute on chronic anemia. GI team consulted. EGD 5/28 showed gastritis/duodenitis and esophageal ulcer without any stigmata of recent bleeding. GI recommended PPI. Seen by vascular team and for now discontinue Eliquis as patient will not need DVT treatment but will need to continue aspirin and Plavix. Hemoglobin slowly drifted down therefore received 1 unit of PRBC.  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  [facility] and [facility] and not capable of performing balloon enteroscopy so transferred to [facility] for further management was recommended.  He has been accepted at [facility] for procedure to be performed and patient will likely be transferred back to [facility] after procedure is completed.       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   Upper GI bleed: s/p EGD which showed gastritis, duodenitis, & esophageal ulcer w/o any stigmata of recent bleeding. 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 No balloon enteroscopy available here or [facility] so patient has been transferred to [facility] for further management.  Awaiting hide assignments.     Acute blood loss anemia: on anticoagulation. Likely secondary to gastritis, duodenitis & esophageal ulcer. S/p 1 unit of pRBCs transfused so far. H&H is stable.  Hemoglobin up from 8-8.3.   Right ankle pain: XR shows soft tissue swelling but no fracture.    Type II DM: Continue insulin glargine and NovoLog as needed.  HbA1c 8.0   Peripheral neuropathy: continue on home dose of gabapentin    HTN: continue on coreg    DVT: of right peroneal vein, persitent. Hx of PAD s/p bilateral femoral endarterectomy with bilateral iliac stent placement 09/10/22. Continue on aspirin, plavix & d/c eliquis as per vasc surg. No need for eliquis anymore as per vasc surg    Sciatica nerve pain of RLE: completed prednisone  taper    Cellulitis of left groin: w/ possible small groin hematoma. Completed 7 days of IV antibiotics (initially received vancomycin followed by ceftriaxone and was eventually switched toIV cefazolin) Dr. Gilda Crease, vascular surgeon, recommended 7 days of Keflex (through 10/16/2022)   AKI: Cr has improved.  Avoid nephrotoxic meds    Overweight: BMI 25.3. Would benefit from weight loss    Hyponatremia: resolved     BPH: Continue Terazosin   HLD: continue on statin        I called [facility] line to follow up on transfer. I spoke with Sam( with [facility]). He confirmed that patient is on the list for transfer and they are still waiting for a bed to open up   Diet Order             Diet - low sodium heart healthy           Diet Carb Modified           Diet Carb Modified Fluid consistency: Thin  Diet effective now                            Consultants: Gastroenterologist Vascular surgeon  Procedures: EGD on 09/30/2022 Video capsule endoscopy on 10/08/2022    Medications:    sodium chloride   Intravenous Once   aspirin EC  81 mg Oral Daily   atorvastatin  40 mg Oral QHS   carvedilol  3.125 mg Oral BID   cephALEXin  500 mg Oral Q12H   clopidogrel  75 mg Oral Daily   folic acid  1 mg Oral Daily   gabapentin  100 mg Oral TID   insulin aspart  0-5 Units Subcutaneous QHS   insulin aspart  0-9 Units Subcutaneous TID WC   insulin glargine-yfgn  30 Units Subcutaneous Daily   multivitamin with minerals  1 tablet Oral Daily   pantoprazole  40 mg Oral BID   senna-docusate  2 tablet Oral BID   silver sulfADIAZINE   Topical BID   terazosin  2 mg Oral QHS   thiamine  100 mg Oral Daily   Continuous Infusions:     Anti-infectives (From admission, onward)    Start     Dose/Rate Route Frequency Ordered Stop   10/09/22 2200  cephALEXin (KEFLEX) capsule 500 mg        500 mg Oral Every 12 hours 10/09/22 1940 10/16/22 2159   10/09/22 1400  cefTRIAXone (ROCEPHIN) 1 g in sodium chloride 0.9 % 100 mL IVPB  Status:  Discontinued        1 g 200 mL/hr over 30 Minutes Intravenous Every 24 hours 10/09/22 1229 10/09/22 1940   10/09/22 0000  cephALEXin (KEFLEX) 500 MG capsule        500 mg Oral Every 12 hours 10/09/22 1941     09/30/22 2200  ceFAZolin (ANCEF) IVPB 2g/100 mL premix        2 g 200 mL/hr over 30 Minutes Intravenous Every 8 hours 09/30/22 1420 10/05/22 2223    09/30/22 1000  cefTRIAXone (ROCEPHIN) 1 g in sodium chloride 0.9 % 100 mL IVPB  Status:  Discontinued        1 g 200 mL/hr over 30 Minutes Intravenous Every 24 hours 09/29/22 1757 09/30/22 1417   09/29/22 2200  vancomycin (VANCOCIN) IVPB 1000 mg/200 mL premix        1,000 mg 200 mL/hr over 60 Minutes Intravenous  Once  09/29/22 2151 09/29/22 2350   09/29/22 2151  vancomycin variable dose per unstable renal function (pharmacist dosing)  Status:  Discontinued         Does not apply See admin instructions 09/29/22 2151 09/30/22 1416   09/29/22 1715  vancomycin (VANCOCIN) IVPB 1000 mg/200 mL premix        1,000 mg 200 mL/hr over 60 Minutes Intravenous  Once 09/29/22 1707 09/29/22 2003   09/29/22 1715  cefTRIAXone (ROCEPHIN) 2 g in sodium chloride 0.9 % 100 mL IVPB        2 g 200 mL/hr over 30 Minutes Intravenous  Once 09/29/22 1707 09/29/22 2003              Family Communication/Anticipated D/C date and plan/Code Status   DVT prophylaxis: Place and maintain sequential compression device Start: 10/04/22 1450 Place TED hose Start: 09/29/22 1736     Code Status: Full Code  Family Communication: None Disposition Plan: Plan to transfer to [facility]    Status is: Inpatient Remains inpatient appropriate because: Awaiting transfer to [facility]       Subjective:   Interval events noted.  No melena.  No fever or chills.  Objective:    Vitals:   10/09/22 2253 10/10/22 0445 10/10/22 0733 10/10/22 1628  BP: (!) 151/56 (!) 156/56 (!) 157/59 (!) 154/58  Pulse: 62 (!) 55 (!) 52 (!) 53  Resp: 18 18 16 16   Temp: 98.4 F (36.9 C) 98.4 F (36.9 C) 98.4 F (36.9 C) 98 F (36.7 C)  TempSrc:  Oral    SpO2: 97% 98% 100% 99%  Weight:      Height:       No data found.   Intake/Output Summary (Last 24 hours) at 10/10/2022 1708 Last data filed at 10/09/2022 1948 Gross per 24 hour  Intake 240 ml  Output --  Net 240 ml   Filed Weights   09/29/22 1454 09/30/22 1424   Weight: 81.2 kg 77.8 kg    Exam:  GEN: NAD SKIN: Warm and dry.  No drainage from incisional wound on the left groin dehisced surgical wound EYES: EOMI ENT: MMM CV: RRR PULM: CTA B ABD: soft, ND, NT, +BS CNS: AAO x 3, non focal EXT: No edema or tenderness       Data Reviewed:   I have personally reviewed following labs and imaging studies:  Labs: Labs show the following:   Basic Metabolic Panel: Recent Labs  Lab 10/04/22 0558 10/05/22 0542 10/06/22 0528 10/07/22 0548  NA 138 137 137 138  K 4.3 4.1 4.1 4.5  CL 109 107 107 105  CO2 23 23 24 25   GLUCOSE 54* 78 86 134*  BUN 20 14 20 19   CREATININE 1.26* 1.17 1.22 1.15  CALCIUM 8.6* 8.3* 8.4* 8.5*  MG 1.7 1.9 1.8 1.9   GFR Estimated Creatinine Clearance: 53.8 mL/min (by C-G formula based on SCr of 1.15 mg/dL). Liver Function Tests: No results for input(s): "AST", "ALT", "ALKPHOS", "BILITOT", "PROT", "ALBUMIN" in the last 168 hours. No results for input(s): "LIPASE", "AMYLASE" in the last 168 hours. No results for input(s): "AMMONIA" in the last 168 hours. Coagulation profile No results for input(s): "INR", "PROTIME" in the last 168 hours.  CBC: Recent Labs  Lab 10/04/22 0558 10/05/22 0542 10/06/22 0528 10/07/22 0548 10/09/22 0921  WBC 6.5 6.6 6.9 7.4 5.9  HGB 8.5* 7.9* 8.2* 8.0* 8.3*  HCT 25.1* 23.6* 24.7* 24.2* 25.5*  MCV 89.0 88.7 89.8 89.6 90.1  PLT 260 229 245 266 286   Cardiac Enzymes: No results for input(s): "CKTOTAL", "CKMB", "CKMBINDEX", "TROPONINI" in the last 168 hours. BNP (last 3 results) No results for input(s): "PROBNP" in the last 8760 hours. CBG: Recent Labs  Lab 10/09/22 1705 10/09/22 2056 10/10/22 0730 10/10/22 1320 10/10/22 1625  GLUCAP 154* 204* 86 158* 153*   D-Dimer: No results for input(s): "DDIMER" in the last 72 hours. Hgb A1c: No results for input(s): "HGBA1C" in the last 72 hours. Lipid Profile: No results for input(s): "CHOL", "HDL", "LDLCALC", "TRIG",  "CHOLHDL", "LDLDIRECT" in the last 72 hours. Thyroid function studies: No results for input(s): "TSH", "T4TOTAL", "T3FREE", "THYROIDAB" in the last 72 hours.  Invalid input(s): "FREET3" Anemia work up: No results for input(s): "VITAMINB12", "FOLATE", "FERRITIN", "TIBC", "IRON", "RETICCTPCT" in the last 72 hours. Sepsis Labs: Recent Labs  Lab 10/05/22 0542 10/06/22 0528 10/07/22 0548 10/09/22 0921  WBC 6.6 6.9 7.4 5.9    Microbiology Recent Results (from the past 240 hour(s))  SARS Coronavirus 2 by RT PCR (hospital order, performed in [facility] hospital lab) *cepheid single result test* Anterior Nasal Swab     Status: None   Collection Time: 10/07/22  9:35 AM   Specimen: Anterior Nasal Swab  Result Value Ref Range Status   SARS Coronavirus 2 by RT PCR NEGATIVE NEGATIVE Final    Comment: (NOTE) SARS-CoV-2 target nucleic acids are NOT DETECTED.  The SARS-CoV-2 RNA is generally detectable in upper and lower respiratory specimens during the acute phase of infection. The lowest concentration of SARS-CoV-2 viral copies this assay can detect is 250 copies / mL. A negative result does not preclude SARS-CoV-2 infection and should not be used as the sole basis for treatment or other patient management decisions.  A negative result may occur with improper specimen collection / handling, submission of specimen other than nasopharyngeal swab, presence of viral mutation(s) within the areas targeted by this assay, and inadequate number of viral copies (<250 copies / mL). A negative result must be combined with clinical observations, patient history, and epidemiological information.  Fact Sheet for Patients:   RoadLapTop.co.za  Fact Sheet for Healthcare Providers: http://kim-miller.com/  This test is not yet approved or  cleared by the [place] FDA and has been authorized for detection and/or diagnosis of SARS-CoV-2 by FDA under an  Emergency Use Authorization (EUA).  This EUA will remain in effect (meaning this test can be used) for the duration of the COVID-19 declaration under Section 564(b)(1) of the Act, 21 U.S.C. section 360bbb-3(b)(1), unless the authorization is terminated or revoked sooner.  Performed at [facility], [address] [place], [place] [address]     Procedures and diagnostic studies:  No results found.             LOS: 11 days   Lilliemae Fruge  [employer] loss adjuster on [email or web address]. If 7PM-7AM, please contact night-coverage at [email or web address]     10/10/2022, 5:08 PM

## 2022-10-10 NOTE — Care Management Important Message (Signed)
Important Message  Patient Details  Name: William Ball MRN: [id number] Date of Birth: August 05, 1945   Medicare Important Message Given:  Yes     [name] 10/10/2022, 10:12 AM

## 2022-10-10 NOTE — Progress Notes (Signed)
Physical Therapy Treatment Patient Details Name: William Ball MRN: [id number] DOB: 1945-10-11 Today's Date: 10/10/2022   History of Present Illness Pt is a 77 y.o. male with history of hypertension, hyperlipidemia, tobacco abuse, diabetes mellitus 2 non-insulin-dependent, constipation, PAD.  Patient is now status post bilateral femoral endarterectomies with bilateral iliac stents placed on 09/10/2026.  He was discharged on 09/15/2022 to home doing well.  He presents to [facility] emergency department on 09/29/2022 with a chief complaint of hypotension and weakness, tarry stools.    PT Comments    Pt seen for PT tx with pt agreeable to tx. Pt performs STS without BUE support with less fatigue on this date. Pt attempts gait without AD but is still limited by R ankle pain - medical team notified. Provided pt with RW & pt ambulates with RW. Pt has a ramp he can use to access home upon d/c. At this time, pt is appropriate for d/c from PT caseload, encouraged pt to continue mobilizing with mobility specialists or on his own. PT to complete current orders, please re-consult if new needs arise.    Recommendations for follow up therapy are one component of a multi-disciplinary discharge planning process, led by the attending physician.  Recommendations may be updated based on patient status, additional functional criteria and insurance authorization.  Follow Up Recommendations  Can patient physically be transported by private vehicle: Yes    Assistance Recommended at Discharge PRN  Patient can return home with the following Assistance with cooking/housework   Equipment Recommendations  None recommended by PT (pt already has RW)    Recommendations for Other Services       Precautions / Restrictions Precautions Precautions: Fall Restrictions Weight Bearing Restrictions: No     Mobility  Bed Mobility Overal bed mobility: Modified Independent Bed Mobility: Supine to Sit     Supine to sit: Modified  independent (Device/Increase time)          Transfers Overall transfer level: Independent Equipment used: None                    Ambulation/Gait Ambulation/Gait assistance: Min guard, Modified independent (Device/Increase time) Gait Distance (Feet): 12 Feet (+ 150 ft) Assistive device: None, Rolling walker (2 wheels) Gait Pattern/deviations: Decreased dorsiflexion - left, Decreased dorsiflexion - right, Decreased step length - left, Decreased step length - right, Decreased stride length Gait velocity: decreased     General Gait Details: Pt ambulates without AD with CGA with improved balance but decreased foot clearance. Pt c/o R ankle pain & notes he prefers the walker. Provided pt with RW & pt ambulates with mod I, slightly pushing RW out in front of him despite education to ambulate within base of AD. Still slightly decreased foot clearance BLE but no overt LOB.   Stairs             Wheelchair Mobility    Modified Rankin (Stroke Patients Only)       Balance Overall balance assessment: Modified Independent Sitting-balance support: Bilateral upper extremity supported, Feet supported Sitting balance-Leahy Scale: Good     Standing balance support: Bilateral upper extremity supported, During functional activity Standing balance-Leahy Scale: Good                              Cognition Arousal/Alertness: Awake/alert Behavior During Therapy: WFL for tasks assessed/performed Overall Cognitive Status: Within Functional Limits for tasks assessed  Exercises      General Comments General comments (skin integrity, edema, etc.): Pt performs 10x STS from EOB without BUE support for BLE strengthening & endurance training with less fatigue on this date.      Pertinent Vitals/Pain Pain Assessment Pain Assessment: Faces Faces Pain Scale: Hurts even more Pain Location: R ankle Pain Descriptors  / Indicators: Throbbing, Numbness, Discomfort Pain Intervention(s): Premedicated before session, Limited activity within patient's tolerance (notified MD)    Home Living                          Prior Function            PT Goals (current goals can now be found in the care plan section) Acute Rehab PT Goals Patient Stated Goal: get better and go home PT Goal Formulation: With patient Time For Goal Achievement: 10/15/22 Potential to Achieve Goals: Good Progress towards PT goals: Goals met/education completed, patient discharged from PT    Frequency    Min 2X/week      PT Plan Discharge plan needs to be updated    Co-evaluation              AM-PAC PT "6 Clicks" Mobility   Outcome Measure  Help needed turning from your back to your side while in a flat bed without using bedrails?: None Help needed moving from lying on your back to sitting on the side of a flat bed without using bedrails?: None Help needed moving to and from a bed to a chair (including a wheelchair)?: None Help needed standing up from a chair using your arms (e.g., wheelchair or bedside chair)?: None Help needed to walk in hospital room?: None Help needed climbing 3-5 steps with a railing? : A Little 6 Click Score: 23    End of Session   Activity Tolerance: Patient tolerated treatment well Patient left: in bed;with call bell/phone within reach   PT Visit Diagnosis: Other abnormalities of gait and mobility (R26.89);Muscle weakness (generalized) (M62.81);Pain;Unsteadiness on feet (R26.81) Pain - Right/Left: Right Pain - part of body: Ankle and joints of foot     Time: 1204-1216 PT Time Calculation (min) (ACUTE ONLY): 12 min  Charges:  $Therapeutic Activity: 8-22 mins                     Aleda Grana, PT, DPT 10/10/22, 12:30 PM   Sandi Mariscal 10/10/2022, 12:29 PM

## 2022-10-11 DIAGNOSIS — K922 Gastrointestinal hemorrhage, unspecified: Secondary | ICD-10-CM | POA: Diagnosis not present

## 2022-10-11 LAB — GLUCOSE, CAPILLARY
Glucose-Capillary: 104 mg/dL — ABNORMAL HIGH (ref 70–99)
Glucose-Capillary: 170 mg/dL — ABNORMAL HIGH (ref 70–99)
Glucose-Capillary: 144 mg/dL — ABNORMAL HIGH (ref 70–99)
Glucose-Capillary: 155 mg/dL — ABNORMAL HIGH (ref 70–99)

## 2022-10-11 NOTE — Discharge Summary (Addendum)
Physician Discharge Summary   Patient: William Ball MRN: [id number] DOB: 1945-05-20  Admit date:     09/29/2022  Discharge date: 10/11/22  Discharge Physician: Lurene Shadow   PCP: Luciana Axe, NP   Recommendations at discharge:    Follow up with PCP in 1-2 weeks F/u w/ GI, Dr. Servando Snare, in 1-2 weeks Follow-up with Dr. 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 [facility] Course:  William Ball is a 77 year old male with history of hypertension, hyperlipidemia, tobacco use, non-insulin-dependent diabetes mellitus, constipation, PAD status post bilateral femoral enterectomy and bilateral iliac stents placed on 09/05/2022, history of occlusive right peroneal vein DVT, who presents emergency department for chief concerns of hypotension and weakness.  He also reported 3 days of melena.  He takes aspirin, Plavix and Eliquis at home.   Patient was noted to have hemoglobin of 9.8, started on PPI, given antibiotics in the ED. Patient was diagnosed with acute on chronic anemia. GI team consulted. EGD 5/28 showed gastritis/duodenitis and esophageal ulcer without any stigmata of recent bleeding. GI recommended PPI. Seen by vascular team and for now discontinue Eliquis as patient will not need DVT  treatment but will need to continue aspirin and Plavix. Hemoglobin slowly drifted down therefore received 1 unit of PRBC.  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  [facility] and [facility] and not capable of performing balloon enteroscopy so transferred to [facility] for further management was recommended.  He has been accepted at [facility] for procedure to be performed and patient will likely be transferred back to [facility] after procedure is completed.   Assessment and Plan: Upper GI bleed: s/p EGD which showed gastritis, duodenitis, & esophageal ulcer w/o any stigmata of recent bleeding. 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 No balloon enteroscopy available here or [facility] so patient has been transferred to [facility] for further management.  Awaiting bed assignment.     Acute blood loss anemia: on anticoagulation. Likely secondary to gastritis, duodenitis & esophageal ulcer. S/p 1 unit of pRBCs transfused so far. H&H is stable.  Hemoglobin up from 8-8.3.   Right ankle pain: XR shows soft tissue swelling but no fracture.    Type II DM: Continue insulin glargine and NovoLog as needed.  HbA1c 8.0   Peripheral neuropathy: continue on home dose of gabapentin    DVT: of right peroneal vein, persitent. Hx of PAD s/p bilateral femoral endarterectomy with bilateral iliac stent placement 09/10/22. Continue on aspirin, plavix & d/c  eliquis as per vasc surg. No need for eliquis anymore as per vasc surg    Sciatica nerve pain of RLE: completed prednisone taper    Cellulitis of left groin: w/ possible small groin hematoma. Completed 7 days of IV antibiotics (initially received vancomycin followed by ceftriaxone and was eventually switched toIV cefazolin) Dr.  Gilda Crease, vascular surgeon, recommended 7 days of Keflex (through 10/16/2022)   AKI: Cr has improved.  Avoid nephrotoxic meds     Hyponatremia: resolved     Other comorbidities include hypertension BPH, hyperlipidemia          Consultants: Gastroenterologist, vascular surgeon  Procedures performed: EGD, video capsule endoscopy   Disposition:  [facility] Diet recommendation:  Discharge Diet Orders (From admission, onward)     Start     Ordered   10/06/22 0000  Diet - low sodium heart healthy        10/06/22 1716   10/06/22 0000  Diet Carb Modified        10/06/22 1716           Cardiac and Carb modified diet DISCHARGE MEDICATION: Allergies as of 10/11/2022       Reactions   Amlodipine Itching        Medication List     STOP taking these medications    apixaban 5 MG Tabs tablet Commonly known as: ELIQUIS       TAKE these medications    albuterol 108 (90 Base) MCG/ACT inhaler Commonly known as: VENTOLIN HFA Inhale 1-2 puffs into the lungs every 6 (six) hours as needed for wheezing or shortness of breath.   aspirin EC 81 MG tablet Take 81 mg by mouth daily.   atorvastatin 40 MG tablet Commonly known as: LIPITOR Take 40 mg by mouth daily.   carvedilol 6.25 MG tablet Commonly known as: COREG Take 3.125 mg by mouth 2 (two) times daily.   cephALEXin 500 MG capsule Commonly known as: KEFLEX Take 1 capsule (500 mg total) by mouth every 12 (twelve) hours.   chlorthalidone 50 MG tablet Commonly known as: HYGROTON Take 1 tablet (50 mg total) by mouth daily. MUST BE SEEN IN CLINIC FOR FURTHER REFILLS ON MEDICATION   clopidogrel 75 MG tablet Commonly known as: PLAVIX Take 1 tablet (75 mg total) by mouth daily.   empagliflozin 10 MG Tabs tablet Commonly known as: JARDIANCE Take 10 mg by mouth daily.   Fish Oil 1000 MG Caps Take 1,000 mg by mouth daily.   folic acid 1 MG tablet Commonly known as: FOLVITE Take 1 tablet (1 mg total) by mouth  daily.   gabapentin 100 MG capsule Commonly known as: NEURONTIN Take 100 mg by mouth 3 (three) times daily.   glipiZIDE 10 MG 24 hr tablet Commonly known as: GLUCOTROL XL Take 20 mg by mouth daily.   insulin degludec 100 UNIT/ML FlexTouch Pen Commonly known as: TRESIBA Inject 30 Units into the skin at bedtime.   multivitamin with minerals Tabs tablet Take 1 tablet by mouth daily.   nicotine 21 mg/24hr patch Commonly known as: NICODERM CQ - dosed in mg/24 hours Place 1 patch (21 mg total) onto the skin daily as needed (nicotine craving).   oxyCODONE-acetaminophen 5-325 MG tablet Commonly known as: PERCOCET/ROXICET Take 1 tablet by mouth every 4 (four) hours as needed for severe pain.   polyethylene glycol 17 g packet Commonly known as: MIRALAX / GLYCOLAX Take 17 g by mouth 2 (two) times daily.   senna-docusate 8.6-50 MG tablet Commonly  known as: Senokot-S Take 2 tablets by mouth 2 (two) times daily.   silver sulfADIAZINE 1 % cream Commonly known as: SILVADENE Apply topically 2 (two) times daily.   spironolactone 25 MG tablet Commonly known as: ALDACTONE Take 25 mg by mouth daily.   terazosin 2 MG capsule Commonly known as: HYTRIN Take 2 mg by mouth at bedtime.   thiamine 100 MG tablet Commonly known as: Vitamin B-1 Take 1 tablet (100 mg total) by mouth daily.   valsartan 320 MG tablet Commonly known as: DIOVAN Take 160 mg by mouth daily.        Discharge Exam: Filed Weights   09/29/22 1454 09/30/22 1424  Weight: 81.2 kg 77.8 kg   GEN: NAD SKIN: Warm and dry. No drainage from incisional wound on left groin EYES: EOMI ENT: MMM CV: RRR PULM: CTA B ABD: soft, ND, NT, +BS CNS: AAO x 3, non focal EXT: No edema or tenderness  Condition at discharge: good  The results of significant diagnostics from this hospitalization (including imaging, microbiology, ancillary and laboratory) are listed below for reference.   Imaging Studies: DG Ankle 2 Views  Right  Result Date: 09/29/2022 CLINICAL DATA:  [id number] with ankle pain and purulent cellulitis. EXAM: RIGHT ANKLE - 2 VIEW COMPARISON:  None Available. FINDINGS: There is no evidence of fracture, dislocation, or joint effusion. There is no evidence of arthropathy or acute focal bone abnormality. Moderate posterior and plantar calcaneal enthesopathic spurring is noted. Mild anterior soft tissue fullness is noted with otherwise unremarkable soft tissues. IMPRESSION: 1. Mild anterior soft tissue fullness without underlying osseous abnormality. 2. Moderate posterior and plantar calcaneal enthesopathic spurring. Electronically Signed   By: Almira Bar M.D.   On: 09/29/2022 22:13   [place] Lower Ext Art Left Ltd  Result Date: 09/29/2022 CLINICAL DATA:  Recent endovascular stent placement. Redness and drainage at incision site in left groin. EXAM: LEFT LOWER EXTREMITY ARTERIAL DUPLEX SCAN TECHNIQUE: Gray-scale sonography as well as color Doppler and duplex ultrasound was performed to evaluate the left common femoral artery, superficial femoral artery and profunda femoral artery in the area of concern COMPARISON:  None Available. FINDINGS: Left lower Extremity No pseudoaneurysm seen. No visible AV fistula. Hypoechoic material is seen in the left groin adjacent to the vessels and in the left groin, likely small hematoma. Area in the left groin measures approximately 1 cm in maximum diameter. IMPRESSION: No evidence of pseudoaneurysm or AV fistula. Probable small left groin hematoma. Electronically Signed   By: Charlett Nose M.D.   On: 09/29/2022 21:06   US Venous Img Lower Bilateral  Result Date: 09/29/2022 CLINICAL DATA:  Leg swelling.  Follow-up DVT. EXAM: BILATERAL LOWER EXTREMITY VENOUS DOPPLER ULTRASOUND TECHNIQUE: Gray-scale sonography with graded compression, as well as color Doppler and duplex ultrasound were performed to evaluate the lower extremity deep venous systems from the level of the common femoral vein  and including the common femoral, femoral, profunda femoral, popliteal and calf veins including the posterior tibial, peroneal and gastrocnemius veins when visible. The superficial great saphenous vein was also interrogated. Spectral Doppler was utilized to evaluate flow at rest and with distal augmentation maneuvers in the common femoral, femoral and popliteal veins. COMPARISON:  Right lower extremity duplex 09/22/2022. FINDINGS: RIGHT LOWER EXTREMITY Common Femoral Vein: No evidence of thrombus. Normal compressibility, respiratory phasicity and response to augmentation. Saphenofemoral Junction: No evidence of thrombus. Normal compressibility and flow on color Doppler imaging. Profunda Femoral Vein: No evidence of thrombus. Normal compressibility and flow  on color Doppler imaging. Femoral Vein: No evidence of thrombus. Normal compressibility, respiratory phasicity and response to augmentation. Popliteal Vein: No evidence of thrombus. Normal compressibility, respiratory phasicity and response to augmentation. Calf Veins: Again seen occlusion of the peroneal vein. Posterior tibial vein is patent. Superficial Great Saphenous Vein: No evidence of thrombus. Normal compressibility. Venous Reflux:  None. Other Findings:  None. LEFT LOWER EXTREMITY Common Femoral Vein: No evidence of thrombus. Normal compressibility, respiratory phasicity and response to augmentation. Saphenofemoral Junction: No evidence of thrombus. Normal compressibility and flow on color Doppler imaging. Profunda Femoral Vein: No evidence of thrombus. Normal compressibility and flow on color Doppler imaging. Femoral Vein: No evidence of thrombus. Normal compressibility, respiratory phasicity and response to augmentation. Popliteal Vein: No evidence of thrombus. Normal compressibility, respiratory phasicity and response to augmentation. Calf Veins: No evidence of thrombus. Normal compressibility and flow on color Doppler imaging. Superficial Great  Saphenous Vein: No evidence of thrombus. Normal compressibility. Venous Reflux:  None. Other Findings:  None. IMPRESSION: 1. 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 2. No left lower extremity DVT. Electronically Signed   By: Narda Rutherford M.D.   On: 09/29/2022 19:28   DG Chest Port 1 View  Result Date: 09/29/2022 CLINICAL DATA:  Intractable nausea and vomiting. Weakness and hypotension. EXAM: PORTABLE CHEST 1 VIEW COMPARISON:  Chest radiographs 01/27/2017, CT abdomen and pelvis 01/27/2017 FINDINGS: Moderate atherosclerotic calcifications within the aortic arch. A benign calcified granuloma again overlies the inferolateral left lung, also seen on prior CT. The lungs are clear. No pleural effusion or pneumothorax. No acute skeletal abnormality. IMPRESSION: No acute cardiopulmonary disease process. Electronically Signed   By: Neita Garnet M.D.   On: 09/29/2022 18:29   US Venous Img Lower Unilateral Right (DVT)  Result Date: 09/22/2022 CLINICAL DATA:  Right lower extremity edema and erythema. Status post bilateral femoral surgical endarterectomy and iliac stent placement on 09/10/2022 EXAM: RIGHT LOWER EXTREMITY VENOUS DOPPLER ULTRASOUND TECHNIQUE: Gray-scale sonography with graded compression, as well as color Doppler and duplex ultrasound were performed to evaluate the lower extremity deep venous systems from the level of the common femoral vein and including the common femoral, femoral, profunda femoral, popliteal and calf veins including the posterior tibial, peroneal and gastrocnemius veins when visible. The superficial great saphenous vein was also interrogated. Spectral Doppler was utilized to evaluate flow at rest and with distal augmentation maneuvers in the common femoral, femoral and popliteal veins. COMPARISON:  None Available. FINDINGS: Contralateral Common Femoral Vein: Respiratory phasicity is normal and symmetric with the symptomatic side. No  evidence of thrombus. Normal compressibility. Common Femoral Vein: No evidence of thrombus. Normal compressibility, respiratory phasicity and response to augmentation. Saphenofemoral Junction: No evidence of thrombus. Normal compressibility and flow on color Doppler imaging. Profunda Femoral Vein: No evidence of thrombus. Normal compressibility and flow on color Doppler imaging. Femoral Vein: No evidence of thrombus. Normal compressibility, respiratory phasicity and response to augmentation. Popliteal Vein: No evidence of thrombus. Normal compressibility, respiratory phasicity and response to augmentation. Calf Veins: Posterior tibial vein demonstrates normal patency. There is a segment of visualized peroneal vein thrombus that appears to be occlusive. Superficial Great Saphenous Vein: No evidence of thrombus. Normal compressibility. Venous Reflux:  None. Other Findings: No evidence of superficial thrombophlebitis. Focal hypoechoic fluid collection in the right groin measures roughly 4 x 2 x 4 cm and is consistent with postoperative seroma or hematoma. IMPRESSION: 1. Segmental occlusive DVT in the right peroneal vein.  2. Focal fluid collection in the right groin measuring 4 x 2 x 4 cm is consistent with postoperative seroma or hematoma. Electronically Signed   By: Irish Lack M.D.   On: 09/22/2022 14:10    Microbiology: Results for orders placed or performed during the hospital encounter of 09/29/22  Blood culture (routine x 2)     Status: None   Collection Time: 09/29/22  6:32 PM   Specimen: BLOOD  Result Value Ref Range Status   Specimen Description BLOOD LEFT ANTECUBITAL  Final   Special Requests   Final    BOTTLES DRAWN AEROBIC AND ANAEROBIC Blood Culture adequate volume   Culture   Final    NO GROWTH 5 DAYS Performed at [facility], [address] [place], [place] [address]    Report Status 10/04/2022 FINAL  Final  Blood culture (routine x 2)     Status: None   Collection Time:  09/29/22  6:32 PM   Specimen: BLOOD  Result Value Ref Range Status   Specimen Description BLOOD BLOOD LEFT FOREARM  Final   Special Requests   Final    BOTTLES DRAWN AEROBIC AND ANAEROBIC Blood Culture results may not be optimal due to an excessive volume of blood received in culture bottles   Culture   Final    NO GROWTH 5 DAYS Performed at [facility], [address] [place], [place] [address]    Report Status 10/04/2022 FINAL  Final  SARS Coronavirus 2 by RT PCR (hospital order, performed in [facility] hospital lab) *cepheid single result test* Anterior Nasal Swab     Status: None   Collection Time: 10/07/22  9:35 AM   Specimen: Anterior Nasal Swab  Result Value Ref Range Status   SARS Coronavirus 2 by RT PCR NEGATIVE NEGATIVE Final    Comment: (NOTE) SARS-CoV-2 target nucleic acids are NOT DETECTED.  The SARS-CoV-2 RNA is generally detectable in upper and lower respiratory specimens during the acute phase of infection. The lowest concentration of SARS-CoV-2 viral copies this assay can detect is 250 copies / mL. A negative result does not preclude SARS-CoV-2 infection and should not be used as the sole basis for treatment or other patient management decisions.  A negative result may occur with improper specimen collection / handling, submission of specimen other than nasopharyngeal swab, presence of viral mutation(s) within the areas targeted by this assay, and inadequate number of viral copies (<250 copies / mL). A negative result must be combined with clinical observations, patient history, and epidemiological information.  Fact Sheet for Patients:   RoadLapTop.co.za  Fact Sheet for Healthcare Providers: http://kim-miller.com/  This test is not yet approved or  cleared by the [place] FDA and has been authorized for detection and/or diagnosis of SARS-CoV-2 by FDA under an Emergency Use Authorization (EUA).  This  EUA will remain in effect (meaning this test can be used) for the duration of the COVID-19 declaration under Section 564(b)(1) of the Act, 21 U.S.C. section 360bbb-3(b)(1), unless the authorization is terminated or revoked sooner.  Performed at [facility], [address] [place], [place] [address]     Labs: CBC: Recent Labs  Lab 10/05/22 0542 10/06/22 0528 10/07/22 0548 10/09/22 0921  WBC 6.6 6.9 7.4 5.9  HGB 7.9* 8.2* 8.0* 8.3*  HCT 23.6* 24.7* 24.2* 25.5*  MCV 88.7 89.8 89.6 90.1  PLT 229 245 266 286   Basic Metabolic Panel: Recent Labs  Lab 10/05/22 0542 10/06/22 0528 10/07/22 0548  NA 137 137 138  K 4.1 4.1 4.5  CL 107 107 105  CO2 23 24 25   GLUCOSE 78 86 134*  BUN 14 20 19   CREATININE 1.17 1.22 1.15  CALCIUM 8.3* 8.4* 8.5*  MG 1.9 1.8 1.9   Liver Function Tests: No results for input(s): "AST", "ALT", "ALKPHOS", "BILITOT", "PROT", "ALBUMIN" in the last 168 hours. CBG: Recent Labs  Lab 10/10/22 1320 10/10/22 1625 10/10/22 2132 10/11/22 0905 10/11/22 1140  GLUCAP 158* 153* 193* 104* 170*    Discharge time spent: less than 30 minutes.  Signed: Lurene Shadow, MD [facility] 10/11/2022

## 2022-10-11 NOTE — TOC Progression Note (Signed)
Transition of Care [facility]) - Progression Note    Patient Details  Name: William Ball MRN: [id number] Date of Birth: Oct 25, 1945  Transition of [facility]) CM/SW Contact  Bing Quarry, RN Phone Number: 10/11/2022, 8:51 AM  Clinical Narrative: 10/11/22: Patient pending transfer to [facility] system for balloon enteroscopy procedure unavailable at [facility]. Patient does have a bed available per provider progress notes dated 10/10/22. Transfer to be arranged between provider and accepting facility. Gabriel Cirri RN CM [phone] Weekends only).          Expected Discharge Plan and Services         Expected Discharge Date: 10/06/22                                     Social Determinants of Health (SDOH) Interventions SDOH Screenings   Food Insecurity: No Food Insecurity (09/30/2022)  Housing: Low Risk  (09/30/2022)  Transportation Needs: No Transportation Needs (09/30/2022)  Utilities: Not At Risk (09/30/2022)  Depression (PHQ2-9): Low Risk  (06/13/2019)  Financial Resource Strain: Low Risk  (08/06/2017)  Physical Activity: Inactive (08/06/2017)  Social Connections: Moderately Isolated (08/06/2017)  Stress: No Stress Concern Present (08/06/2017)  Tobacco Use: High Risk (10/08/2022)    Readmission Risk Interventions     No data to display

## 2022-10-11 NOTE — Progress Notes (Addendum)
Progress Note    William Ball  VHQ:[id number] DOB: 1945/11/09  DOA: 09/29/2022 PCP: Luciana Axe, NP      Brief Narrative:    Medical records reviewed and are as summarized below:  William Ball is a 77 y.o. male  with history of hypertension, hyperlipidemia, tobacco use, non-insulin-dependent diabetes mellitus, constipation, PAD status post bilateral femoral enterectomy and bilateral iliac stents placed on 09/05/2022, history of occlusive right peroneal vein DVT, who presents emergency department for chief concerns of hypotension and weakness.  He also reported 3 days of melena.  He takes aspirin, Plavix and Eliquis at home.   Patient was noted to have hemoglobin of 9.8, started on PPI, given antibiotics in the ED. Patient was diagnosed with acute on chronic anemia. GI team consulted. EGD 5/28 showed gastritis/duodenitis and esophageal ulcer without any stigmata of recent bleeding. GI recommended PPI. Seen by vascular team and for now discontinue Eliquis as patient will not need DVT treatment but will need to continue aspirin and Plavix. Hemoglobin slowly drifted down therefore received 1 unit of PRBC.  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  [facility] and [facility] and not capable of performing balloon enteroscopy so transferred to [facility] for further management was recommended.  He has been accepted at [facility] for procedure to be performed and patient will likely be transferred back to [facility] after procedure is completed.       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   Upper GI bleed: s/p EGD which showed gastritis, duodenitis, & esophageal ulcer w/o any stigmata of recent bleeding. 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 No balloon enteroscopy available here or [facility] so patient has been transferred to [facility] for further management.  Awaiting bed assignment.     Acute blood loss anemia: on anticoagulation. Likely secondary to gastritis, duodenitis & esophageal ulcer. S/p 1 unit of pRBCs transfused so far. H&H is stable.  Hemoglobin up from 8-8.3.   Right ankle pain: XR shows soft tissue swelling but no fracture.    Type II DM: Continue insulin glargine and NovoLog as needed.  HbA1c 8.0   Peripheral neuropathy: continue on home dose of gabapentin    DVT: of right peroneal vein, persitent. Hx of PAD s/p bilateral femoral endarterectomy with bilateral iliac stent placement 09/10/22. Continue on aspirin, plavix & d/c eliquis as per vasc surg. No need for eliquis anymore as per vasc surg    Sciatica nerve pain of RLE: completed prednisone taper    Cellulitis of left  groin: w/ possible small groin hematoma. Completed 7 days of IV antibiotics (initially received vancomycin followed by ceftriaxone and was eventually switched toIV cefazolin) Dr. Gilda Crease, vascular surgeon, recommended 7 days of Keflex (through 10/16/2022)   AKI: Cr has improved.  Avoid nephrotoxic meds      Hyponatremia: resolved    Other comorbidities include hypertension BPH, hyperlipidemia   [facility]  called the unit this morning and informed the nurse that they still don't have beds   Diet Order             Diet - low sodium heart healthy           Diet Carb Modified           Diet Carb Modified Fluid consistency: Thin  Diet effective now                            Consultants: Gastroenterologist Vascular surgeon  Procedures: EGD on 09/30/2022 Video capsule endoscopy on 10/08/2022    Medications:    sodium chloride   Intravenous Once   aspirin EC  81 mg Oral Daily   atorvastatin  40 mg Oral QHS   carvedilol  3.125 mg Oral BID   cephALEXin  500 mg Oral Q12H   clopidogrel  75 mg Oral Daily   folic acid  1 mg Oral Daily   gabapentin  100 mg Oral TID   insulin aspart  0-5 Units Subcutaneous QHS   insulin aspart  0-9 Units Subcutaneous TID WC   insulin glargine-yfgn  30 Units Subcutaneous Daily   multivitamin with minerals  1 tablet Oral Daily   pantoprazole  40 mg Oral BID   senna-docusate  2 tablet Oral BID   silver sulfADIAZINE   Topical BID   terazosin  2 mg Oral QHS   thiamine  100 mg Oral Daily   Continuous Infusions:     Anti-infectives (From admission, onward)    Start     Dose/Rate Route Frequency Ordered Stop   10/09/22 2200  cephALEXin (KEFLEX) capsule 500 mg        500 mg Oral Every 12 hours 10/09/22 1940 10/16/22 2159   10/09/22 1400  cefTRIAXone (ROCEPHIN) 1 g in sodium chloride 0.9 % 100 mL IVPB  Status:  Discontinued        1 g 200 mL/hr over 30 Minutes Intravenous Every 24 hours 10/09/22 1229 10/09/22 1940   10/09/22 0000  cephALEXin (KEFLEX) 500 MG capsule        500 mg Oral Every 12 hours 10/09/22 1941     09/30/22 2200  ceFAZolin (ANCEF) IVPB 2g/100 mL premix        2 g 200 mL/hr over 30 Minutes Intravenous Every 8 hours 09/30/22 1420 10/05/22 2223   09/30/22 1000  cefTRIAXone (ROCEPHIN) 1 g in sodium chloride 0.9 % 100 mL IVPB  Status:  Discontinued        1 g 200 mL/hr over 30 Minutes Intravenous Every 24 hours 09/29/22  1757 09/30/22 1417   09/29/22 2200  vancomycin (VANCOCIN) IVPB 1000 mg/200 mL premix        1,000 mg 200 mL/hr over 60 Minutes Intravenous  Once 09/29/22 2151 09/29/22 2350   09/29/22 2151  vancomycin variable dose per unstable renal function (pharmacist dosing)  Status:  Discontinued         Does not apply See admin instructions 09/29/22 2151 09/30/22 1416   09/29/22 1715  vancomycin (  VANCOCIN) IVPB 1000 mg/200 mL premix        1,000 mg 200 mL/hr over 60 Minutes Intravenous  Once 09/29/22 1707 09/29/22 2003   09/29/22 1715  cefTRIAXone (ROCEPHIN) 2 g in sodium chloride 0.9 % 100 mL IVPB        2 g 200 mL/hr over 30 Minutes Intravenous  Once 09/29/22 1707 09/29/22 2003              Family Communication/Anticipated D/C date and plan/Code Status   DVT prophylaxis: Place and maintain sequential compression device Start: 10/04/22 1450 Place TED hose Start: 09/29/22 1736     Code Status: Full Code  Family Communication: None Disposition Plan: Plan to transfer to [facility]    Status is: Inpatient Remains inpatient appropriate because: Awaiting transfer to [facility]       Subjective:   Interval events noted. No melena, abdominal pain, vomiting, fever  Objective:    Vitals:   10/10/22 2136 10/10/22 2140 10/11/22 0418 10/11/22 0907  BP: (!) 162/51 (!) 162/51 (!) 164/52 (!) 155/74  Pulse:  65  (!) 54  Resp:   20 16  Temp:   98.4 F (36.9 C) 98 F (36.7 C)  TempSrc:   Oral Oral  SpO2:  100% 98% 99%  Weight:      Height:       No data found.   Intake/Output Summary (Last 24 hours) at 10/11/2022 1525 Last data filed at 10/10/2022 1903 Gross per 24 hour  Intake 240 ml  Output --  Net 240 ml   Filed Weights   09/29/22 1454 09/30/22 1424  Weight: 81.2 kg 77.8 kg    Exam:  GEN: NAD SKIN: Warm and dry. No drainage from incisional wound on left groin EYES: EOMI ENT: MMM CV: RRR PULM: CTA B ABD: soft, ND, NT, +BS CNS: AAO x 3, non focal EXT: Right  pedal edema with mild tenderness, no erythema    Data Reviewed:   I have personally reviewed following labs and imaging studies:  Labs: Labs show the following:   Basic Metabolic Panel: Recent Labs  Lab 10/05/22 0542 10/06/22 0528 10/07/22 0548  NA 137 137 138  K 4.1 4.1 4.5  CL 107 107 105  CO2 23 24 25   GLUCOSE 78 86 134*  BUN 14 20 19   CREATININE 1.17 1.22 1.15  CALCIUM 8.3* 8.4* 8.5*  MG 1.9 1.8 1.9   GFR Estimated Creatinine Clearance: 53.8 mL/min (by C-G formula based on SCr of 1.15 mg/dL). Liver Function Tests: No results for input(s): "AST", "ALT", "ALKPHOS", "BILITOT", "PROT", "ALBUMIN" in the last 168 hours. No results for input(s): "LIPASE", "AMYLASE" in the last 168 hours. No results for input(s): "AMMONIA" in the last 168 hours. Coagulation profile No results for input(s): "INR", "PROTIME" in the last 168 hours.  CBC: Recent Labs  Lab 10/05/22 0542 10/06/22 0528 10/07/22 0548 10/09/22 0921  WBC 6.6 6.9 7.4 5.9  HGB 7.9* 8.2* 8.0* 8.3*  HCT 23.6* 24.7* 24.2* 25.5*  MCV 88.7 89.8 89.6 90.1  PLT 229 245 266 286   Cardiac Enzymes: No results for input(s): "CKTOTAL", "CKMB", "CKMBINDEX", "TROPONINI" in the last 168 hours. BNP (last 3 results) No results for input(s): "PROBNP" in the last 8760 hours. CBG: Recent Labs  Lab 10/10/22 1320 10/10/22 1625 10/10/22 2132 10/11/22 0905 10/11/22 1140  GLUCAP 158* 153* 193* 104* 170*   D-Dimer: No results for input(s): "DDIMER" in the last 72 hours. Hgb A1c: No results for input(s): "  HGBA1C" in the last 72 hours. Lipid Profile: No results for input(s): "CHOL", "HDL", "LDLCALC", "TRIG", "CHOLHDL", "LDLDIRECT" in the last 72 hours. Thyroid function studies: No results for input(s): "TSH", "T4TOTAL", "T3FREE", "THYROIDAB" in the last 72 hours.  Invalid input(s): "FREET3" Anemia work up: No results for input(s): "VITAMINB12", "FOLATE", "FERRITIN", "TIBC", "IRON", "RETICCTPCT" in the last 72  hours. Sepsis Labs: Recent Labs  Lab 10/05/22 0542 10/06/22 0528 10/07/22 0548 10/09/22 0921  WBC 6.6 6.9 7.4 5.9    Microbiology Recent Results (from the past 240 hour(s))  SARS Coronavirus 2 by RT PCR (hospital order, performed in [facility] hospital lab) *cepheid single result test* Anterior Nasal Swab     Status: None   Collection Time: 10/07/22  9:35 AM   Specimen: Anterior Nasal Swab  Result Value Ref Range Status   SARS Coronavirus 2 by RT PCR NEGATIVE NEGATIVE Final    Comment: (NOTE) SARS-CoV-2 target nucleic acids are NOT DETECTED.  The SARS-CoV-2 RNA is generally detectable in upper and lower respiratory specimens during the acute phase of infection. The lowest concentration of SARS-CoV-2 viral copies this assay can detect is 250 copies / mL. A negative result does not preclude SARS-CoV-2 infection and should not be used as the sole basis for treatment or other patient management decisions.  A negative result may occur with improper specimen collection / handling, submission of specimen other than nasopharyngeal swab, presence of viral mutation(s) within the areas targeted by this assay, and inadequate number of viral copies (<250 copies / mL). A negative result must be combined with clinical observations, patient history, and epidemiological information.  Fact Sheet for Patients:   RoadLapTop.co.za  Fact Sheet for Healthcare Providers: http://kim-miller.com/  This test is not yet approved or  cleared by the [place] FDA and has been authorized for detection and/or diagnosis of SARS-CoV-2 by FDA under an Emergency Use Authorization (EUA).  This EUA will remain in effect (meaning this test can be used) for the duration of the COVID-19 declaration under Section 564(b)(1) of the Act, 21 U.S.C. section 360bbb-3(b)(1), unless the authorization is terminated or revoked sooner.  Performed at [facility],  [address] [place], [place] [address]     Procedures and diagnostic studies:  No results found.             LOS: 12 days   Alison Breeding  [employer] loss adjuster on [email or web address]. If 7PM-7AM, please contact night-coverage at [email or web address]     10/11/2022, 3:25 PM

## 2022-10-12 DIAGNOSIS — K922 Gastrointestinal hemorrhage, unspecified: Secondary | ICD-10-CM | POA: Diagnosis not present

## 2022-10-12 LAB — GLUCOSE, CAPILLARY
Glucose-Capillary: 101 mg/dL — ABNORMAL HIGH (ref 70–99)
Glucose-Capillary: 213 mg/dL — ABNORMAL HIGH (ref 70–99)
Glucose-Capillary: 192 mg/dL — ABNORMAL HIGH (ref 70–99)
Glucose-Capillary: 205 mg/dL — ABNORMAL HIGH (ref 70–99)

## 2022-10-12 NOTE — Progress Notes (Incomplete)
Patient RT leg/ankle swelling, red and tender to touch. No compression device on at this time. Pt states he takes Plavix and ASA. Ambulates in room independently with limp. Standby assist preferred and recommended to patient. Informed pt to rest until provider can get around to look at leg/ankle again. Notified night Hospitaist Lindajo Royal, MD

## 2022-10-12 NOTE — Progress Notes (Addendum)
Progress Note    William Ball  UJW:[id number] DOB: [birth year]  DOA: 09/29/2022 PCP: Luciana Axe, NP      Brief Narrative:    Medical records reviewed and are as summarized below:  William Ball is a 77 y.o. male  with history of hypertension, hyperlipidemia, tobacco use, non-insulin-dependent diabetes mellitus, constipation, PAD status post bilateral femoral enterectomy and bilateral iliac stents placed on 09/05/2022, history of occlusive right peroneal vein DVT, who presents emergency department for chief concerns of hypotension and weakness.  He also reported 3 days of melena.  He takes aspirin, Plavix and Eliquis at home.   Patient was noted to have hemoglobin of 9.8, started on PPI, given antibiotics in the ED. Patient was diagnosed with acute on chronic anemia. GI team consulted. EGD 5/28 showed gastritis/duodenitis and esophageal ulcer without any stigmata of recent bleeding. GI recommended PPI. Seen by vascular team and for now discontinue Eliquis as patient will not need DVT treatment but will need to continue aspirin and Plavix. Hemoglobin slowly drifted down therefore received 1 unit of PRBC.  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  [facility] and [facility] and not capable of performing balloon enteroscopy so transferred to [facility] for further management was recommended.  He has been accepted at [facility] for procedure to be performed and patient will likely be transferred back to [facility] after procedure is completed.       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   Upper GI bleed: s/p EGD which showed gastritis, duodenitis, & esophageal ulcer w/o any stigmata of recent bleeding. 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 No balloon enteroscopy available here or [facility] so patient has been transferred to [facility] for further management.  Awaiting bed assignment.     Acute blood loss anemia: on anticoagulation. Likely secondary to gastritis, duodenitis & esophageal ulcer. S/p 1 unit of pRBCs transfused so far. H&H is stable.  Hemoglobin up from 8-8.3.   Right ankle pain: XR shows soft tissue swelling but no fracture.    Type II DM: Continue insulin glargine and NovoLog as needed.  HbA1c 8.0   Peripheral neuropathy: continue on home dose of gabapentin    DVT: of right peroneal vein, persitent. Hx of PAD s/p bilateral femoral endarterectomy with bilateral iliac stent placement 09/10/22. Continue on aspirin, plavix & d/c eliquis as per vasc surg. No need for eliquis anymore as per vasc surg    Sciatica nerve pain of RLE: Improved   Cellulitis of left groin: w/ possible  small groin hematoma. Completed 7 days of IV antibiotics (initially received vancomycin followed by ceftriaxone and was eventually switched toIV cefazolin) Continue Keflex for total 7 days (through 10/16/2022)    AKI: Cr has improved.  Avoid nephrotoxic meds      Hyponatremia: resolved    Other comorbidities include hypertension BPH, hyperlipidemia   [facility] called the unit this morning and informed the  nurse that they still don't have beds   Diet Order             Diet - low sodium heart healthy           Diet Carb Modified           Diet Carb Modified Fluid consistency: Thin  Diet effective now                            Consultants: Gastroenterologist Vascular surgeon  Procedures: EGD on 09/30/2022 Video capsule endoscopy on 10/08/2022    Medications:    sodium chloride   Intravenous Once   aspirin EC  81 mg Oral Daily   atorvastatin  40 mg Oral QHS   carvedilol  3.125 mg Oral BID   cephALEXin  500 mg Oral Q12H   clopidogrel  75 mg Oral Daily   folic acid  1 mg Oral Daily   gabapentin  100 mg Oral TID   insulin aspart  0-5 Units Subcutaneous QHS   insulin aspart  0-9 Units Subcutaneous TID WC   insulin glargine-yfgn  30 Units Subcutaneous Daily   multivitamin with minerals  1 tablet Oral Daily   pantoprazole  40 mg Oral BID   senna-docusate  2 tablet Oral BID   silver sulfADIAZINE   Topical BID   terazosin  2 mg Oral QHS   thiamine  100 mg Oral Daily   Continuous Infusions:     Anti-infectives (From admission, onward)    Start     Dose/Rate Route Frequency Ordered Stop   10/09/22 2200  cephALEXin (KEFLEX) capsule 500 mg        500 mg Oral Every 12 hours 10/09/22 1940 10/16/22 2159   10/09/22 1400  cefTRIAXone (ROCEPHIN) 1 g in sodium chloride 0.9 % 100 mL IVPB  Status:  Discontinued        1 g 200 mL/hr over 30 Minutes Intravenous Every 24 hours 10/09/22 1229 10/09/22 1940   10/09/22 0000  cephALEXin (KEFLEX) 500 MG capsule        500 mg Oral Every 12 hours 10/09/22 1941     09/30/22 2200  ceFAZolin (ANCEF) IVPB 2g/100 mL premix        2 g 200 mL/hr over 30 Minutes Intravenous Every 8 hours 09/30/22 1420 10/05/22 2223   09/30/22 1000  cefTRIAXone (ROCEPHIN) 1 g in sodium chloride 0.9 % 100 mL IVPB  Status:  Discontinued        1 g 200 mL/hr over 30 Minutes Intravenous Every 24 hours 09/29/22 1757 09/30/22 1417   09/29/22 2200  vancomycin  (VANCOCIN) IVPB 1000 mg/200 mL premix        1,000 mg 200 mL/hr over 60 Minutes Intravenous  Once 09/29/22 2151 09/29/22 2350   09/29/22 2151  vancomycin variable dose per unstable renal function (pharmacist dosing)  Status:  Discontinued         Does not apply See admin instructions 09/29/22 2151 09/30/22 1416   09/29/22 1715  vancomycin (VANCOCIN) IVPB 1000 mg/200 mL  premix        1,000 mg 200 mL/hr over 60 Minutes Intravenous  Once 09/29/22 1707 09/29/22 2003   09/29/22 1715  cefTRIAXone (ROCEPHIN) 2 g in sodium chloride 0.9 % 100 mL IVPB        2 g 200 mL/hr over 30 Minutes Intravenous  Once 09/29/22 1707 09/29/22 2003              Family Communication/Anticipated D/C date and plan/Code Status   DVT prophylaxis: Place and maintain sequential compression device Start: 10/04/22 1450 Place TED hose Start: 09/29/22 1736     Code Status: Full Code  Family Communication: None Disposition Plan: Plan to transfer to [facility]    Status is: Inpatient Remains inpatient appropriate because: Awaiting transfer to [facility]       Subjective:   No complaints.  No abdominal pain, melena, fever or chills.  No drainage from left groin wound.  Objective:    Vitals:   10/11/22 1658 10/11/22 2015 10/12/22 0501 10/12/22 0749  BP: (!) 166/49 (!) 160/52 (!) 151/60 (!) 162/54  Pulse: (!) 54 (!) 56 (!) 50 (!) 49  Resp: 16 18 16 16   Temp: 98.7 F (37.1 C) 98.6 F (37 C) 98.2 F (36.8 C) 98.2 F (36.8 C)  TempSrc: Oral   Oral  SpO2: 99% 98% 99% 99%  Weight:      Height:       No data found.  No intake or output data in the 24 hours ending 10/12/22 1513  Filed Weights   09/29/22 1454 09/30/22 1424  Weight: 81.2 kg 77.8 kg    Exam:   GEN: NAD SKIN: No drainage from incisional wound on left groin.  Dressing is clean, dry and intact. EYES: EOMI ENT: MMM CV: RRR PULM: CTA B ABD: soft, ND, NT, +BS CNS: AAO x 3, non focal EXT: Right pedal edema with mild  tenderness but no erythema.     Data Reviewed:   I have personally reviewed following labs and imaging studies:  Labs: Labs show the following:   Basic Metabolic Panel: Recent Labs  Lab 10/06/22 0528 10/07/22 0548  NA 137 138  K 4.1 4.5  CL 107 105  CO2 24 25  GLUCOSE 86 134*  BUN 20 19  CREATININE 1.22 1.15  CALCIUM 8.4* 8.5*  MG 1.8 1.9   GFR Estimated Creatinine Clearance: 53.8 mL/min (by C-G formula based on SCr of 1.15 mg/dL). Liver Function Tests: No results for input(s): "AST", "ALT", "ALKPHOS", "BILITOT", "PROT", "ALBUMIN" in the last 168 hours. No results for input(s): "LIPASE", "AMYLASE" in the last 168 hours. No results for input(s): "AMMONIA" in the last 168 hours. Coagulation profile No results for input(s): "INR", "PROTIME" in the last 168 hours.  CBC: Recent Labs  Lab 10/06/22 0528 10/07/22 0548 10/09/22 0921  WBC 6.9 7.4 5.9  HGB 8.2* 8.0* 8.3*  HCT 24.7* 24.2* 25.5*  MCV 89.8 89.6 90.1  PLT 245 266 286   Cardiac Enzymes: No results for input(s): "CKTOTAL", "CKMB", "CKMBINDEX", "TROPONINI" in the last 168 hours. BNP (last 3 results) No results for input(s): "PROBNP" in the last 8760 hours. CBG: Recent Labs  Lab 10/11/22 1140 10/11/22 1717 10/11/22 2138 10/12/22 0751 10/12/22 1209  GLUCAP 170* 144* 155* 101* 213*   D-Dimer: No results for input(s): "DDIMER" in the last 72 hours. Hgb A1c: No results for input(s): "HGBA1C" in the last 72 hours. Lipid Profile: No results for input(s): "CHOL", "HDL", "LDLCALC", "TRIG", "CHOLHDL", "LDLDIRECT"  in the last 72 hours. Thyroid function studies: No results for input(s): "TSH", "T4TOTAL", "T3FREE", "THYROIDAB" in the last 72 hours.  Invalid input(s): "FREET3" Anemia work up: No results for input(s): "VITAMINB12", "FOLATE", "FERRITIN", "TIBC", "IRON", "RETICCTPCT" in the last 72 hours. Sepsis Labs: Recent Labs  Lab 10/06/22 0528 10/07/22 0548 10/09/22 0921  WBC 6.9 7.4 5.9     Microbiology Recent Results (from the past 240 hour(s))  SARS Coronavirus 2 by RT PCR (hospital order, performed in [facility] hospital lab) *cepheid single result test* Anterior Nasal Swab     Status: None   Collection Time: 10/07/22  9:35 AM   Specimen: Anterior Nasal Swab  Result Value Ref Range Status   SARS Coronavirus 2 by RT PCR NEGATIVE NEGATIVE Final    Comment: (NOTE) SARS-CoV-2 target nucleic acids are NOT DETECTED.  The SARS-CoV-2 RNA is generally detectable in upper and lower respiratory specimens during the acute phase of infection. The lowest concentration of SARS-CoV-2 viral copies this assay can detect is 250 copies / mL. A negative result does not preclude SARS-CoV-2 infection and should not be used as the sole basis for treatment or other patient management decisions.  A negative result may occur with improper specimen collection / handling, submission of specimen other than nasopharyngeal swab, presence of viral mutation(s) within the areas targeted by this assay, and inadequate number of viral copies (<250 copies / mL). A negative result must be combined with clinical observations, patient history, and epidemiological information.  Fact Sheet for Patients:   RoadLapTop.co.za  Fact Sheet for Healthcare Providers: http://kim-miller.com/  This test is not yet approved or  cleared by the [place] FDA and has been authorized for detection and/or diagnosis of SARS-CoV-2 by FDA under an Emergency Use Authorization (EUA).  This EUA will remain in effect (meaning this test can be used) for the duration of the COVID-19 declaration under Section 564(b)(1) of the Act, 21 U.S.C. section 360bbb-3(b)(1), unless the authorization is terminated or revoked sooner.  Performed at [facility], [address] [place], [place] [address]     Procedures and diagnostic studies:  No results  found.             LOS: 13 days   Balen Woolum  [employer] loss adjuster on [email or web address]. If 7PM-7AM, please contact night-coverage at [email or web address]     10/12/2022, 3:13 PM

## 2022-10-12 NOTE — Progress Notes (Signed)
Patient RT leg/ankle swelling, red and tender to touch. No compression device on at this time. Pt states he takes Plavix and ASA. Ambulates in room independently with limp. Standby assist preferred and recommended to patient. Informed pt to rest until provider can get around to look at leg/ankle again. Notified night Hospitaist Lindajo Royal, MD

## 2022-10-13 DIAGNOSIS — K922 Gastrointestinal hemorrhage, unspecified: Secondary | ICD-10-CM | POA: Diagnosis not present

## 2022-10-13 LAB — GLUCOSE, CAPILLARY
Glucose-Capillary: 79 mg/dL (ref 70–99)
Glucose-Capillary: 87 mg/dL (ref 70–99)
Glucose-Capillary: 200 mg/dL — ABNORMAL HIGH (ref 70–99)
Glucose-Capillary: 221 mg/dL — ABNORMAL HIGH (ref 70–99)
Glucose-Capillary: 222 mg/dL — ABNORMAL HIGH (ref 70–99)

## 2022-10-13 MED ORDER — SPIRONOLACTONE 25 MG PO TABS
25.0000 mg | ORAL_TABLET | Freq: Every day | ORAL | Status: DC
  Administered 2022-10-13 – 2022-10-16 (×4): 25 mg via ORAL
  Filled 2022-10-13 (×4): qty 1

## 2022-10-13 MED ORDER — IRBESARTAN 150 MG PO TABS
150.0000 mg | ORAL_TABLET | Freq: Every day | ORAL | Status: DC
  Administered 2022-10-13 – 2022-10-16 (×4): 150 mg via ORAL
  Filled 2022-10-13 (×4): qty 1

## 2022-10-13 NOTE — Progress Notes (Addendum)
Progress Note    William Ball  ZOX:[id number] DOB: Sep 19, 1945  DOA: 09/29/2022 PCP: Luciana Axe, NP      Brief Narrative:    Medical records reviewed and are as summarized below:  William Ball is a 77 y.o. male  with history of hypertension, hyperlipidemia, tobacco use, non-insulin-dependent diabetes mellitus, constipation, PAD status post bilateral femoral enterectomy and bilateral iliac stents placed on 09/05/2022, history of occlusive right peroneal vein DVT, who presents emergency department for chief concerns of hypotension and weakness.  He also reported 3 days of melena.  He takes aspirin, Plavix and Eliquis at home.   Patient was noted to have hemoglobin of 9.8, started on PPI, given antibiotics in the ED. Patient was diagnosed with acute on chronic anemia. GI team consulted. EGD 5/28 showed gastritis/duodenitis and esophageal ulcer without any stigmata of recent bleeding. GI recommended PPI. Seen by vascular team and for now discontinue Eliquis as patient will not need DVT treatment but will need to continue aspirin and Plavix. Hemoglobin slowly drifted down therefore received 1 unit of PRBC.  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  [facility] and [facility] and not capable of performing balloon enteroscopy so transferred to [facility] for further management was recommended.  He has been accepted at [facility] for procedure to be performed and patient will likely be transferred back to [facility] after procedure is completed.       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   Upper GI bleed: s/p EGD which showed gastritis, duodenitis, & esophageal ulcer w/o any stigmata of recent bleeding. 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 No balloon enteroscopy available here or [facility] so patient has been transferred to [facility] for further management.  Awaiting bed assignment.     Acute blood loss anemia: on anticoagulation. Likely secondary to gastritis, duodenitis & esophageal ulcer. S/p 1 unit of pRBCs transfused so far. H&H is stable.  Hemoglobin up from 8-8.3.   Right ankle pain: XR shows soft tissue swelling but no fracture.    Type II DM: Continue insulin glargine and NovoLog as needed.  HbA1c 8.0   Peripheral neuropathy: continue on home dose of gabapentin    DVT of right peroneal vein, PAD s/p bilateral femoral endarterectomy with bilateral iliac stent placement 09/10/22. Continue on aspirin, plavix & d/c eliquis as per vasc surg. No need for eliquis anymore as per vasc surg    Sciatica nerve pain of RLE: Improved   Cellulitis of left groin: w/ possible small groin hematoma.  Completed 7 days of IV antibiotics (initially received vancomycin followed by ceftriaxone and was eventually switched toIV cefazolin) Continue Keflex for total 7 days (through 10/16/2022)    AKI: Cr has improved.  Avoid nephrotoxic meds      Hyponatremia: resolved    Other comorbidities include hypertension BPH, hyperlipidemia    [facility] called for updates. Case discussed with Dr. Veverly Fells, hospitalist  at [facility], on 10/13/2022 at 2:15 pm.  Still awaiting bed assignment.     Diet Order             Diet - low sodium heart healthy           Diet Carb Modified           Diet Carb Modified Fluid consistency: Thin  Diet effective now                            Consultants: Gastroenterologist Vascular surgeon  Procedures: EGD on 09/30/2022 Video capsule endoscopy on 10/08/2022    Medications:    sodium chloride   Intravenous Once   aspirin EC  81 mg Oral Daily   atorvastatin  40 mg Oral QHS   carvedilol  3.125 mg Oral BID   cephALEXin  500 mg Oral Q12H   clopidogrel  75 mg Oral Daily   folic acid  1 mg Oral Daily   gabapentin  100 mg Oral TID   insulin aspart  0-5 Units Subcutaneous QHS   insulin aspart  0-9 Units Subcutaneous TID WC   insulin glargine-yfgn  30 Units Subcutaneous Daily   irbesartan  150 mg Oral Daily   multivitamin with minerals  1 tablet Oral Daily   pantoprazole  40 mg Oral BID   senna-docusate  2 tablet Oral BID   silver sulfADIAZINE   Topical BID   spironolactone  25 mg Oral Daily   terazosin  2 mg Oral QHS   thiamine  100 mg Oral Daily   Continuous Infusions:     Anti-infectives (From admission, onward)    Start     Dose/Rate Route Frequency Ordered Stop   10/09/22 2200  cephALEXin (KEFLEX) capsule 500 mg        500 mg Oral Every 12 hours 10/09/22 1940 10/16/22 2159   10/09/22 1400  cefTRIAXone (ROCEPHIN) 1 g in sodium chloride 0.9 % 100 mL IVPB  Status:  Discontinued        1 g 200 mL/hr over 30 Minutes Intravenous Every 24 hours 10/09/22 1229 10/09/22 1940   10/09/22 0000  cephALEXin (KEFLEX) 500 MG capsule        500 mg Oral Every 12 hours 10/09/22 1941     09/30/22 2200  ceFAZolin (ANCEF) IVPB 2g/100 mL premix        2 g 200 mL/hr over 30 Minutes Intravenous Every 8 hours 09/30/22 1420 10/05/22 2223   09/30/22 1000  cefTRIAXone (ROCEPHIN) 1 g in sodium chloride 0.9 % 100 mL IVPB  Status:  Discontinued        1  g 200 mL/hr over 30 Minutes Intravenous Every 24 hours 09/29/22 1757 09/30/22 1417   09/29/22 2200  vancomycin (VANCOCIN) IVPB 1000 mg/200 mL premix        1,000 mg 200 mL/hr over 60 Minutes Intravenous  Once 09/29/22 2151 09/29/22 2350   09/29/22 2151  vancomycin variable dose per unstable renal function (pharmacist dosing)  Status:  Discontinued  Does not apply See admin instructions 09/29/22 2151 09/30/22 1416   09/29/22 1715  vancomycin (VANCOCIN) IVPB 1000 mg/200 mL premix        1,000 mg 200 mL/hr over 60 Minutes Intravenous  Once 09/29/22 1707 09/29/22 2003   09/29/22 1715  cefTRIAXone (ROCEPHIN) 2 g in sodium chloride 0.9 % 100 mL IVPB        2 g 200 mL/hr over 30 Minutes Intravenous  Once 09/29/22 1707 09/29/22 2003              Family Communication/Anticipated D/C date and plan/Code Status   DVT prophylaxis: Place and maintain sequential compression device Start: 10/04/22 1450 Place TED hose Start: 09/29/22 1736     Code Status: Full Code  Family Communication: None Disposition Plan: Plan to transfer to [facility]    Status is: Inpatient Remains inpatient appropriate because: Awaiting transfer to [facility]       Subjective:   Interval events noted.  No complaints.  No melena.  He still has some pain in the right foot but it's manageable.   Objective:    Vitals:   10/13/22 0208 10/13/22 0532 10/13/22 0737 10/13/22 1606  BP: (!) 148/53 (!) 150/55 (!) 142/68 (!) 154/49  Pulse: (!) 53 (!) 50 (!) 51 (!) 54  Resp: 19 16 16 17   Temp:  98.7 F (37.1 C) 98.1 F (36.7 C) 98.2 F (36.8 C)  TempSrc:   Oral Oral  SpO2: 100% 99% 99% 99%  Weight:      Height:       No data found.   Intake/Output Summary (Last 24 hours) at 10/13/2022 1709 Last data filed at 10/13/2022 1044 Gross per 24 hour  Intake 790 ml  Output --  Net 790 ml    Filed Weights   09/29/22 1454 09/30/22 1424  Weight: 81.2 kg 77.8 kg    Exam:   GEN: NAD SKIN:  Warm and dry.  Dressing on right groin surgical wound is clean, dry and intact. EYES: EOMI ENT: MMM CV: RRR PULM: CTA B ABD: soft, ND, NT, +BS CNS: AAO x 3, non focal EXT: Right pedal edema, no erythema or tenderness    Data Reviewed:   I have personally reviewed following labs and imaging studies:  Labs: Labs show the following:   Basic Metabolic Panel: Recent Labs  Lab 10/07/22 0548  NA 138  K 4.5  CL 105  CO2 25  GLUCOSE 134*  BUN 19  CREATININE 1.15  CALCIUM 8.5*  MG 1.9   GFR Estimated Creatinine Clearance: 53.8 mL/min (by C-G formula based on SCr of 1.15 mg/dL). Liver Function Tests: No results for input(s): "AST", "ALT", "ALKPHOS", "BILITOT", "PROT", "ALBUMIN" in the last 168 hours. No results for input(s): "LIPASE", "AMYLASE" in the last 168 hours. No results for input(s): "AMMONIA" in the last 168 hours. Coagulation profile No results for input(s): "INR", "PROTIME" in the last 168 hours.  CBC: Recent Labs  Lab 10/07/22 0548 10/09/22 0921  WBC 7.4 5.9  HGB 8.0* 8.3*  HCT 24.2* 25.5*  MCV 89.6 90.1  PLT 266 286   Cardiac Enzymes: No results for input(s): "CKTOTAL", "CKMB", "CKMBINDEX", "TROPONINI" in the last 168 hours. BNP (last 3 results) No results for input(s): "PROBNP" in the last 8760 hours. CBG: Recent Labs  Lab 10/12/22 2021 10/13/22 0535 10/13/22 0748 10/13/22 1121 10/13/22 1614  GLUCAP 205* 79 87 200* 221*   D-Dimer: No results for input(s): "DDIMER" in the last 72 hours. Hgb  A1c: No results for input(s): "HGBA1C" in the last 72 hours. Lipid Profile: No results for input(s): "CHOL", "HDL", "LDLCALC", "TRIG", "CHOLHDL", "LDLDIRECT" in the last 72 hours. Thyroid function studies: No results for input(s): "TSH", "T4TOTAL", "T3FREE", "THYROIDAB" in the last 72 hours.  Invalid input(s): "FREET3" Anemia work up: No results for input(s): "VITAMINB12", "FOLATE", "FERRITIN", "TIBC", "IRON", "RETICCTPCT" in the last 72 hours. Sepsis  Labs: Recent Labs  Lab 10/07/22 0548 10/09/22 0921  WBC 7.4 5.9    Microbiology Recent Results (from the past 240 hour(s))  SARS Coronavirus 2 by RT PCR (hospital order, performed in [facility] hospital lab) *cepheid single result test* Anterior Nasal Swab     Status: None   Collection Time: 10/07/22  9:35 AM   Specimen: Anterior Nasal Swab  Result Value Ref Range Status   SARS Coronavirus 2 by RT PCR NEGATIVE NEGATIVE Final    Comment: (NOTE) SARS-CoV-2 target nucleic acids are NOT DETECTED.  The SARS-CoV-2 RNA is generally detectable in upper and lower respiratory specimens during the acute phase of infection. The lowest concentration of SARS-CoV-2 viral copies this assay can detect is 250 copies / mL. A negative result does not preclude SARS-CoV-2 infection and should not be used as the sole basis for treatment or other patient management decisions.  A negative result may occur with improper specimen collection / handling, submission of specimen other than nasopharyngeal swab, presence of viral mutation(s) within the areas targeted by this assay, and inadequate number of viral copies (<250 copies / mL). A negative result must be combined with clinical observations, patient history, and epidemiological information.  Fact Sheet for Patients:   RoadLapTop.co.za  Fact Sheet for Healthcare Providers: http://kim-miller.com/  This test is not yet approved or  cleared by the [place] FDA and has been authorized for detection and/or diagnosis of SARS-CoV-2 by FDA under an Emergency Use Authorization (EUA).  This EUA will remain in effect (meaning this test can be used) for the duration of the COVID-19 declaration under Section 564(b)(1) of the Act, 21 U.S.C. section 360bbb-3(b)(1), unless the authorization is terminated or revoked sooner.  Performed at [facility], [address] [place], [place] [address]      Procedures and diagnostic studies:  No results found.             LOS: 14 days   Darcus Edds  [employer] loss adjuster on [email or web address]. If 7PM-7AM, please contact night-coverage at [email or web address]     10/13/2022, 5:09 PM

## 2022-10-13 NOTE — Care Management Important Message (Signed)
Important Message  Patient Details  Name: William Ball MRN: [id number] Date of Birth: 1945/08/23   Medicare Important Message Given:  Yes     Olegario Messier A Aveline Daus 10/13/2022, 11:41 AM

## 2022-10-14 DIAGNOSIS — K922 Gastrointestinal hemorrhage, unspecified: Secondary | ICD-10-CM | POA: Diagnosis not present

## 2022-10-14 LAB — CBC
HCT: 24.2 % — ABNORMAL LOW (ref 39.0–52.0)
Hemoglobin: 7.8 g/dL — ABNORMAL LOW (ref 13.0–17.0)
MCH: 29.7 pg (ref 26.0–34.0)
MCHC: 32.2 g/dL (ref 30.0–36.0)
MCV: 92 fL (ref 80.0–100.0)
Platelets: 249 K/uL (ref 150–400)
RBC: 2.63 MIL/uL — ABNORMAL LOW (ref 4.22–5.81)
RDW: 14.2 % (ref 11.5–15.5)
WBC: 6.1 K/uL (ref 4.0–10.5)
nRBC: 0 % (ref 0.0–0.2)

## 2022-10-14 LAB — GLUCOSE, CAPILLARY
Glucose-Capillary: 100 mg/dL — ABNORMAL HIGH (ref 70–99)
Glucose-Capillary: 192 mg/dL — ABNORMAL HIGH (ref 70–99)
Glucose-Capillary: 148 mg/dL — ABNORMAL HIGH (ref 70–99)
Glucose-Capillary: 208 mg/dL — ABNORMAL HIGH (ref 70–99)

## 2022-10-14 NOTE — Plan of Care (Signed)
  Problem: Education: Goal: Ability to describe self-care measures that may prevent or decrease complications (Diabetes Survival Skills Education) will improve Outcome: Progressing Goal: Individualized Educational Video(s) Outcome: Progressing   Problem: Coping: Goal: Ability to adjust to condition or change in health will improve Outcome: Progressing   Problem: Fluid Volume: Goal: Ability to maintain a balanced intake and output will improve Outcome: Progressing   Problem: Health Behavior/Discharge Planning: Goal: Ability to identify and utilize available resources and services will improve Outcome: Progressing Goal: Ability to manage health-related needs will improve Outcome: Progressing   Problem: Metabolic: Goal: Ability to maintain appropriate glucose levels will improve Outcome: Progressing   Problem: Nutritional: Goal: Maintenance of adequate nutrition will improve Outcome: Progressing Goal: Progress toward achieving an optimal weight will improve Outcome: Progressing   Problem: Skin Integrity: Goal: Risk for impaired skin integrity will decrease Outcome: Progressing   Problem: Tissue Perfusion: Goal: Adequacy of tissue perfusion will improve Outcome: Progressing   Problem: Education: Goal: Knowledge of General Education information will improve Description: Including pain rating scale, medication(s)/side effects and non-pharmacologic comfort measures Outcome: Progressing   Problem: Health Behavior/Discharge Planning: Goal: Ability to manage health-related needs will improve Outcome: Progressing   Problem: Clinical Measurements: Goal: Ability to maintain clinical measurements within normal limits will improve Outcome: Progressing Goal: Will remain free from infection Outcome: Progressing Goal: Diagnostic test results will improve Outcome: Progressing Goal: Respiratory complications will improve Outcome: Progressing Goal: Cardiovascular complication will  be avoided Outcome: Progressing   Problem: Activity: Goal: Risk for activity intolerance will decrease Outcome: Progressing   Problem: Nutrition: Goal: Adequate nutrition will be maintained Outcome: Progressing   Problem: Coping: Goal: Level of anxiety will decrease Outcome: Progressing   Problem: Elimination: Goal: Will not experience complications related to bowel motility Outcome: Progressing Goal: Will not experience complications related to urinary retention Outcome: Progressing   Problem: Pain Managment: Goal: General experience of comfort will improve Outcome: Progressing   Problem: Safety: Goal: Ability to remain free from injury will improve Outcome: Progressing   Problem: Skin Integrity: Goal: Risk for impaired skin integrity will decrease Outcome: Progressing   Problem: Education: Goal: Ability to identify signs and symptoms of gastrointestinal bleeding will improve Outcome: Progressing   Problem: Bowel/Gastric: Goal: Will show no signs and symptoms of gastrointestinal bleeding Outcome: Progressing   Problem: Fluid Volume: Goal: Will show no signs and symptoms of excessive bleeding Outcome: Progressing   Problem: Clinical Measurements: Goal: Complications related to the disease process, condition or treatment will be avoided or minimized Outcome: Progressing   

## 2022-10-14 NOTE — Progress Notes (Signed)
Progress Note    William Ball  ZOX:[id number] DOB: 04/25/1946  DOA: 09/29/2022 PCP: Luciana Axe, NP      Brief Narrative:    Medical records reviewed and are as summarized below:  William Ball is a 77 y.o. male  with history of hypertension, hyperlipidemia, tobacco use, non-insulin-dependent diabetes mellitus, constipation, PAD status post bilateral femoral enterectomy and bilateral iliac stents placed on 09/05/2022, history of occlusive right peroneal vein DVT, who presents emergency department for chief concerns of hypotension and weakness.  He also reported 3 days of melena.  He takes aspirin, Plavix and Eliquis at home.   Patient was noted to have hemoglobin of 9.8, started on PPI, given antibiotics in the ED. Patient was diagnosed with acute on chronic anemia. GI team consulted. EGD 5/28 showed gastritis/duodenitis and esophageal ulcer without any stigmata of recent bleeding. GI recommended PPI. Seen by vascular team and for now discontinue Eliquis as patient will not need DVT treatment but will need to continue aspirin and Plavix. Hemoglobin slowly drifted down therefore received 1 unit of PRBC.  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  [facility] and [facility] and not capable of performing balloon enteroscopy so transferred to [facility] for further management was recommended.  He has been accepted at [facility] for procedure to be performed and patient will likely be transferred back to [facility] after procedure is completed.       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   Upper GI bleed: s/p EGD which showed gastritis, duodenitis, & esophageal ulcer w/o any stigmata of recent bleeding. 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 No balloon enteroscopy available here or [facility] so patient has been transferred to [facility] for further management.  Awaiting bed assignment.     Acute blood loss anemia: on anticoagulation. Likely secondary to gastritis, duodenitis & esophageal ulcer. S/p 1 unit of pRBCs transfused so far. H&H is stable.  Hemoglobin is down from 8.3-7.8.  He is asymptomatic.    Right ankle pain: XR shows soft tissue swelling but no fracture.    Type II DM: Glucose levels are stable.  Continue insulin glargine and NovoLog as needed.  HbA1c 8.0   Peripheral neuropathy: continue on home dose of gabapentin    DVT of right peroneal vein, PAD s/p bilateral femoral endarterectomy with bilateral iliac stent placement 09/10/22. Continue on aspirin, plavix & d/c eliquis as per vasc surg. No need for eliquis anymore as per vasc surg    Sciatica nerve pain of RLE: Improved  Cellulitis of left groin: w/ possible small groin hematoma. Completed 7 days of IV antibiotics (initially received vancomycin followed by ceftriaxone and was eventually switched toIV cefazolin) Continue Keflex for total 7 days (through 10/16/2022)    AKI: Cr has improved.  Avoid nephrotoxic meds      Hyponatremia: resolved    Other comorbidities include hypertension BPH, hyperlipidemia    [facility] called  for updates. Case discussed with Dr. Veverly Fells, hospitalist at [facility], on 10/13/2022 at 2:15 pm.  Still awaiting bed assignment.     Diet Order             Diet - low sodium heart healthy           Diet Carb Modified           Diet Carb Modified Fluid consistency: Thin  Diet effective now                            Consultants: Gastroenterologist Vascular surgeon  Procedures: EGD on 09/30/2022 Video capsule endoscopy on 10/08/2022    Medications:    sodium chloride   Intravenous Once   aspirin EC  81 mg Oral Daily   atorvastatin  40 mg Oral QHS   carvedilol  3.125 mg Oral BID   cephALEXin  500 mg Oral Q12H   clopidogrel  75 mg Oral Daily   folic acid  1 mg Oral Daily   gabapentin  100 mg Oral TID   insulin aspart  0-5 Units Subcutaneous QHS   insulin aspart  0-9 Units Subcutaneous TID WC   insulin glargine-yfgn  30 Units Subcutaneous Daily   irbesartan  150 mg Oral Daily   multivitamin with minerals  1 tablet Oral Daily   pantoprazole  40 mg Oral BID   senna-docusate  2 tablet Oral BID   silver sulfADIAZINE   Topical BID   spironolactone  25 mg Oral Daily   terazosin  2 mg Oral QHS   thiamine  100 mg Oral Daily   Continuous Infusions:     Anti-infectives (From admission, onward)    Start     Dose/Rate Route Frequency Ordered Stop   10/09/22 2200  cephALEXin (KEFLEX) capsule 500 mg        500 mg Oral Every 12 hours 10/09/22 1940 10/16/22 2159   10/09/22 1400  cefTRIAXone (ROCEPHIN) 1 g in sodium chloride 0.9 % 100 mL IVPB  Status:  Discontinued        1 g 200 mL/hr over 30 Minutes Intravenous Every 24 hours 10/09/22 1229 10/09/22 1940   10/09/22 0000  cephALEXin (KEFLEX) 500 MG capsule        500 mg Oral Every 12 hours 10/09/22 1941     09/30/22 2200  ceFAZolin (ANCEF) IVPB 2g/100 mL premix        2 g 200 mL/hr over 30 Minutes Intravenous Every 8 hours 09/30/22 1420 10/05/22 2223   09/30/22 1000  cefTRIAXone (ROCEPHIN) 1 g in sodium  chloride 0.9 % 100 mL IVPB  Status:  Discontinued        1 g 200 mL/hr over 30 Minutes Intravenous Every 24 hours 09/29/22 1757 09/30/22 1417   09/29/22 2200  vancomycin (VANCOCIN) IVPB 1000 mg/200 mL premix        1,000 mg 200 mL/hr over 60 Minutes Intravenous  Once 09/29/22 2151 09/29/22 2350   09/29/22 2151  vancomycin variable dose per unstable renal function (pharmacist dosing)  Status:  Discontinued         Does not apply See admin instructions 09/29/22 2151 09/30/22 1416   09/29/22 1715  vancomycin (VANCOCIN) IVPB 1000 mg/200 mL premix        1,000 mg 200 mL/hr over 60 Minutes Intravenous  Once 09/29/22 1707 09/29/22 2003   09/29/22 1715  cefTRIAXone (ROCEPHIN) 2 g in sodium chloride 0.9 % 100 mL IVPB        2 g 200 mL/hr over 30 Minutes Intravenous  Once 09/29/22 1707 09/29/22 2003              Family Communication/Anticipated D/C date and plan/Code Status   DVT prophylaxis: Place and maintain sequential compression device Start: 10/04/22 1450 Place TED hose Start: 09/29/22 1736     Code Status: Full Code  Family Communication: None Disposition Plan: Plan to transfer to [facility]    Status is: Inpatient Remains inpatient appropriate because: Awaiting transfer to [facility]       Subjective:   Interval events noted.  No bloody stools or vomiting.  Objective:    Vitals:   10/14/22 0522 10/14/22 0708 10/14/22 0851 10/14/22 1531  BP: (!) 146/46 (!) 142/62 (!) 160/52 (!) 163/54  Pulse: (!) 51 (!) 53  (!) 59  Resp: 18 18  16   Temp: 98.2 F (36.8 C) 98 F (36.7 C)  98.3 F (36.8 C)  TempSrc: Oral Oral  Oral  SpO2: 96% 98%  98%  Weight:      Height:       No data found.   Intake/Output Summary (Last 24 hours) at 10/14/2022 1754 Last data filed at 10/14/2022 1435 Gross per 24 hour  Intake 480 ml  Output --  Net 480 ml    Filed Weights   09/29/22 1454 09/30/22 1424  Weight: 81.2 kg 77.8 kg    Exam:  GEN: NAD SKIN: Warm and dry.  No  drainage seen from left groin surgical wound at this time.  Dressing is clean, dry and intact. EYES: No pallor or icterus ENT: MMM CV: RRR PULM: CTA B ABD: soft, ND, NT, +BS CNS: AAO x 3, non focal EXT: Right pedal edema with mild tenderness     Data Reviewed:   I have personally reviewed following labs and imaging studies:  Labs: Labs show the following:   Basic Metabolic Panel: No results for input(s): "NA", "K", "CL", "CO2", "GLUCOSE", "BUN", "CREATININE", "CALCIUM", "MG", "PHOS" in the last 168 hours.  GFR Estimated Creatinine Clearance: 53.8 mL/min (by C-G formula based on SCr of 1.15 mg/dL). Liver Function Tests: No results for input(s): "AST", "ALT", "ALKPHOS", "BILITOT", "PROT", "ALBUMIN" in the last 168 hours. No results for input(s): "LIPASE", "AMYLASE" in the last 168 hours. No results for input(s): "AMMONIA" in the last 168 hours. Coagulation profile No results for input(s): "INR", "PROTIME" in the last 168 hours.  CBC: Recent Labs  Lab 10/09/22 0921 10/14/22 0747  WBC 5.9 6.1  HGB 8.3* 7.8*  HCT 25.5* 24.2*  MCV 90.1 92.0  PLT 286 249   Cardiac Enzymes: No results for input(s): "CKTOTAL", "CKMB", "CKMBINDEX", "TROPONINI" in the last 168 hours. BNP (last 3 results) No results for input(s): "PROBNP" in the last 8760 hours. CBG: Recent Labs  Lab 10/13/22 1614 10/13/22 1949 10/14/22 0830 10/14/22 1145 10/14/22 1618  GLUCAP 221* 222* 100* 192* 148*   D-Dimer: No results for input(s): "DDIMER" in the last 72 hours. Hgb A1c: No results for input(s): "HGBA1C" in the last 72  hours. Lipid Profile: No results for input(s): "CHOL", "HDL", "LDLCALC", "TRIG", "CHOLHDL", "LDLDIRECT" in the last 72 hours. Thyroid function studies: No results for input(s): "TSH", "T4TOTAL", "T3FREE", "THYROIDAB" in the last 72 hours.  Invalid input(s): "FREET3" Anemia work up: No results for input(s): "VITAMINB12", "FOLATE", "FERRITIN", "TIBC", "IRON", "RETICCTPCT" in  the last 72 hours. Sepsis Labs: Recent Labs  Lab 10/09/22 0921 10/14/22 0747  WBC 5.9 6.1    Microbiology Recent Results (from the past 240 hour(s))  SARS Coronavirus 2 by RT PCR (hospital order, performed in [facility] hospital lab) *cepheid single result test* Anterior Nasal Swab     Status: None   Collection Time: 10/07/22  9:35 AM   Specimen: Anterior Nasal Swab  Result Value Ref Range Status   SARS Coronavirus 2 by RT PCR NEGATIVE NEGATIVE Final    Comment: (NOTE) SARS-CoV-2 target nucleic acids are NOT DETECTED.  The SARS-CoV-2 RNA is generally detectable in upper and lower respiratory specimens during the acute phase of infection. The lowest concentration of SARS-CoV-2 viral copies this assay can detect is 250 copies / mL. A negative result does not preclude SARS-CoV-2 infection and should not be used as the sole basis for treatment or other patient management decisions.  A negative result may occur with improper specimen collection / handling, submission of specimen other than nasopharyngeal swab, presence of viral mutation(s) within the areas targeted by this assay, and inadequate number of viral copies (<250 copies / mL). A negative result must be combined with clinical observations, patient history, and epidemiological information.  Fact Sheet for Patients:   RoadLapTop.co.za  Fact Sheet for Healthcare Providers: http://kim-miller.com/  This test is not yet approved or  cleared by the [place] FDA and has been authorized for detection and/or diagnosis of SARS-CoV-2 by FDA under an Emergency Use Authorization (EUA).  This EUA will remain in effect (meaning this test can be used) for the duration of the COVID-19 declaration under Section 564(b)(1) of the Act, 21 U.S.C. section 360bbb-3(b)(1), unless the authorization is terminated or revoked sooner.  Performed at [facility], [address]  [place], [place] [address]     Procedures and diagnostic studies:  No results found.             LOS: 15 days   Herold Salguero  [employer] loss adjuster on [email or web address]. If 7PM-7AM, please contact night-coverage at [email or web address]     10/14/2022, 5:54 PM

## 2022-10-14 NOTE — Progress Notes (Signed)
Occupational Therapy Treatment Patient Details Name: William Ball MRN: [id number] DOB: 1945-07-30 Today's Date: 10/14/2022   History of present illness Pt is a 77 y.o. male with history of hypertension, hyperlipidemia, tobacco abuse, diabetes mellitus 2 non-insulin-dependent, constipation, PAD.  Patient is now status post bilateral femoral endarterectomies with bilateral iliac stents placed on 09/10/2026.  He was discharged on 09/15/2022 to home doing well.  He presents to [facility] emergency department on 09/29/2022 with a chief complaint of hypotension and weakness, tarry stools.   OT comments  Upon entering the room, pt supine in bed and agreeable to OT intervention. Pt performs bed mobility without assistance and dons B shoes himself. Pt ambulating in room without use of AD to bathroom for toileting needs and grooming needs while standing independently. RN has also confirmed that pt has been managing needs in room without nursing assistance. Pt does endorse R ankle pain and reports that if he were ambulating a longer distance he would utilize RW secondary to pain. Pt does not need skilled OT intervention at this time and he agrees. All OT goals have been met at this time. OT to sign off.    Recommendations for follow up therapy are one component of a multi-disciplinary discharge planning process, led by the attending physician.  Recommendations may be updated based on patient status, additional functional criteria and insurance authorization.    Assistance Recommended at Discharge PRN  Patient can return home with the following  Assist for transportation;Assistance with cooking/housework   Equipment Recommendations  None recommended by OT       Precautions / Restrictions Precautions Precautions: Fall Restrictions Weight Bearing Restrictions: No       Mobility Bed Mobility Overal bed mobility: Independent                  Transfers Overall transfer level: Independent Equipment  used: None                     Balance Overall balance assessment: Modified Independent                                         ADL either performed or assessed with clinical judgement   ADL Overall ADL's : Independent                                            Extremity/Trunk Assessment Upper Extremity Assessment Upper Extremity Assessment: Overall WFL for tasks assessed   Lower Extremity Assessment Lower Extremity Assessment: Overall WFL for tasks assessed        Vision Patient Visual Report: No change from baseline            Cognition Arousal/Alertness: Awake/alert Behavior During Therapy: WFL for tasks assessed/performed Overall Cognitive Status: Within Functional Limits for tasks assessed                                 General Comments: Very pleasant man, agreeable to participate                   Pertinent Vitals/ Pain       Pain Assessment Pain Assessment: Faces Faces Pain Scale: Hurts a little bit Pain Location: R ankle  Pain Descriptors / Indicators: Discomfort Pain Intervention(s): Monitored during session   Progress Toward Goals  OT Goals(current goals can now be found in the care plan section)  Progress towards OT goals: Progressing toward goals;Goals met/education completed, patient discharged from OT     Plan All goals met and education completed, patient discharged from OT services       AM-PAC OT "6 Clicks" Daily Activity     Outcome Measure   Help from another person eating meals?: None Help from another person taking care of personal grooming?: None Help from another person toileting, which includes using toliet, bedpan, or urinal?: None Help from another person bathing (including washing, rinsing, drying)?: None Help from another person to put on and taking off regular upper body clothing?: None Help from another person to put on and taking off regular lower body clothing?:  None 6 Click Score: 24    End of Session    OT Visit Diagnosis: Other abnormalities of gait and mobility (R26.89)   Activity Tolerance Patient tolerated treatment well   Patient Left in bed;with call bell/phone within reach   Nurse Communication Mobility status        Time: 1610-9604 OT Time Calculation (min): 14 min  Charges: OT General Charges $OT Visit: 1 Visit OT Treatments $Self Care/Home Management : 8-22 mins  Jackquline Denmark, MS, OTR/L , CBIS ascom [phone]  10/14/22, 12:24 PM

## 2022-10-15 DIAGNOSIS — K922 Gastrointestinal hemorrhage, unspecified: Secondary | ICD-10-CM | POA: Diagnosis not present

## 2022-10-15 LAB — GLUCOSE, CAPILLARY
Glucose-Capillary: 102 mg/dL — ABNORMAL HIGH (ref 70–99)
Glucose-Capillary: 155 mg/dL — ABNORMAL HIGH (ref 70–99)
Glucose-Capillary: 172 mg/dL — ABNORMAL HIGH (ref 70–99)
Glucose-Capillary: 212 mg/dL — ABNORMAL HIGH (ref 70–99)
Glucose-Capillary: 277 mg/dL — ABNORMAL HIGH (ref 70–99)

## 2022-10-15 NOTE — Progress Notes (Signed)
PROGRESS NOTE    William TRIPPLETT  ZOX:[id number] DOB: [birth year] DOA: 09/29/2022 PCP: Luciana Axe, NP   Assessment & Plan:   Principal Problem:   Upper GI bleed Active Problems:   Hyperlipidemia   BPH (benign prostatic hyperplasia)   Personal history of tobacco use, presenting hazards to health   Hypertension associated with diabetes (HCC)   PVD (peripheral vascular disease) (HCC)   Hyponatremia   Overweight (BMI 25.0-29.9)   AKI (acute kidney injury) (HCC)   Leukocytosis   Cellulitis of left groin   Deep venous thrombosis (DVT) of right peroneal vein (HCC)   Essential hypertension   Insulin dependent type 2 diabetes mellitus (HCC)   Right ankle pain   Cellulitis of drainage site, post-operative   Ulcer of esophagus without bleeding  Assessment and Plan:  Upper GI bleed: s/p EGD which showed gastritis, duodenitis, & esophageal ulcer w/o any stigmata of recent bleeding. 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 No balloon enteroscopy available here or  & if clinically having signs of bleeding will need to be transferred to [facility] or [facility]. Eliquis was d/c. Pt has been accepted to transfer to [facility] for balloon enteroscopy but waiting on a bed still and its been over a week. [facility] called this morning and states still no bed    Acute blood loss anemia: on anticoagulation. Likely secondary to gastritis, duodenitis & esophageal ulcer. S/p 1 unit of pRBCs transfused so far. H&H are labile. Will transfuse if Hb <7.0     Right ankle pain: XR shows soft tissue swelling but no fracture. RICE.    DM2: HbA2c 8.0, poorly controlled. Continue on glargine, SSI w/ accuchecks   Peripheral neuropathy: continue on home dose of gabapentin     HTN: continue on coreg   DVT: of right peroneal vein. Hx of PAD s/p bilateral femoral endarterectomy with bilateral iliac stent placement 09/10/22.  Continue on aspirin, plavix but eliquis was d/c as per vasc surg    Sciatica nerve pain of RLE: completed prednisone taper    Cellulitis of left groin: w/ possible small groin hematoma. Completed ancef x 7 days   AKI: Cr is labile. Avoid nephrotoxic meds    Overweight: BMI 25.3. Would benefit from weight loss    Hyponatremia: resolved    BPH: continue on terazosin   HLD: continue on statin        DVT prophylaxis: SCDs Code Status: full  Family Communication: Disposition Plan: waiting on bed at [facility]  Level of care: Med-Surg  Status is: Inpatient Remains inpatient appropriate because: waiting on a bed [facility] for balloon enteroscopy     Consultants:  GI   Procedures:   Antimicrobials:   Subjective: Pt c/o being frustrated  Objective: Vitals:   10/14/22 1531 10/14/22 2235 10/15/22 0417 10/15/22 0751  BP: (!) 163/54 (!) 153/40 (!) 140/56 (!) 155/52  Pulse: (!) 59 72 (!) 54 (!) 56  Resp: 16 16 16 17   Temp: 98.3 F (36.8 C) 99.2 F (37.3 C) 98 F (36.7 C) 98.3 F (36.8 C)  TempSrc: Oral   Oral  SpO2: 98% 100% 99% 97%  Weight:      Height:        Intake/Output Summary (Last 24 hours) at 10/15/2022 1352 Last data filed at 10/15/2022 1045 Gross per 24 hour  Intake 480 ml  Output --  Net 480 ml   Filed Weights   09/29/22 1454 09/30/22 1424  Weight: 81.2 kg 77.8 kg    Examination:  General exam: Appears calm and comfortable  Respiratory system: Clear to auscultation. Respiratory effort normal. Cardiovascular system: S1 & S2 +. No rubs, gallops or clicks.  Gastrointestinal system: Abdomen is nondistended, soft and nontender. Normal bowel sounds heard. Central nervous system: Alert and oriented. Moves all extremities  Psychiatry: Judgement and insight appear normal. Mood & affect appropriate.     Data Reviewed: I have personally reviewed following labs and imaging studies  CBC: Recent Labs  Lab 10/09/22 0921 10/14/22 0747  WBC 5.9 6.1  HGB 8.3*  7.8*  HCT 25.5* 24.2*  MCV 90.1 92.0  PLT 286 249   Basic Metabolic Panel: No results for input(s): "NA", "K", "CL", "CO2", "GLUCOSE", "BUN", "CREATININE", "CALCIUM", "MG", "PHOS" in the last 168 hours. GFR: Estimated Creatinine Clearance: 53.8 mL/min (by C-G formula based on SCr of 1.15 mg/dL). Liver Function Tests: No results for input(s): "AST", "ALT", "ALKPHOS", "BILITOT", "PROT", "ALBUMIN" in the last 168 hours. No results for input(s): "LIPASE", "AMYLASE" in the last 168 hours. No results for input(s): "AMMONIA" in the last 168 hours. Coagulation Profile: No results for input(s): "INR", "PROTIME" in the last 168 hours. Cardiac Enzymes: No results for input(s): "CKTOTAL", "CKMB", "CKMBINDEX", "TROPONINI" in the last 168 hours. BNP (last 3 results) No results for input(s): "PROBNP" in the last 8760 hours. HbA1C: No results for input(s): "HGBA1C" in the last 72 hours. CBG: Recent Labs  Lab 10/14/22 1145 10/14/22 1618 10/14/22 2226 10/15/22 0752 10/15/22 1158  GLUCAP 192* 148* 208* 102* 155*   Lipid Profile: No results for input(s): "CHOL", "HDL", "LDLCALC", "TRIG", "CHOLHDL", "LDLDIRECT" in the last 72 hours. Thyroid Function Tests: No results for input(s): "TSH", "T4TOTAL", "FREET4", "T3FREE", "THYROIDAB" in the last 72 hours. Anemia Panel: No results for input(s): "VITAMINB12", "FOLATE", "FERRITIN", "TIBC", "IRON", "RETICCTPCT" in the last 72 hours. Sepsis Labs: No results for input(s): "PROCALCITON", "LATICACIDVEN" in the last 168 hours.  Recent Results (from the past 240 hour(s))  SARS Coronavirus 2 by RT PCR (hospital order, performed in [facility] hospital lab) *cepheid single result test* Anterior Nasal Swab     Status: None   Collection Time: 10/07/22  9:35 AM   Specimen: Anterior Nasal Swab  Result Value Ref Range Status   SARS Coronavirus 2 by RT PCR NEGATIVE NEGATIVE Final    Comment: (NOTE) SARS-CoV-2 target nucleic acids are NOT DETECTED.  The  SARS-CoV-2 RNA is generally detectable in upper and lower respiratory specimens during the acute phase of infection. The lowest concentration of SARS-CoV-2 viral copies this assay can detect is 250 copies / mL. A negative result does not preclude SARS-CoV-2 infection and should not be used as the sole basis for treatment or other patient management decisions.  A negative result may occur with improper specimen collection / handling, submission of specimen other than nasopharyngeal swab, presence of viral mutation(s) within the areas targeted by this assay, and inadequate number of viral copies (<250 copies / mL). A negative result must be combined with clinical observations, patient history, and epidemiological information.  Fact Sheet for Patients:   RoadLapTop.co.za  Fact Sheet for Healthcare Providers: http://kim-miller.com/  This test is not yet approved or  cleared by the [place] FDA and has been authorized for detection and/or diagnosis of SARS-CoV-2 by FDA under an Emergency Use Authorization (EUA).  This EUA will remain in effect (meaning this test can  be used) for the duration of the COVID-19 declaration under Section 564(b)(1) of the Act, 21 U.S.C. section 360bbb-3(b)(1), unless the authorization is terminated or revoked sooner.  Performed at [facility], [address], [place], [place] [address]          Radiology Studies: No results found.      Scheduled Meds:  sodium chloride   Intravenous Once   aspirin EC  81 mg Oral Daily   atorvastatin  40 mg Oral QHS   carvedilol  3.125 mg Oral BID   cephALEXin  500 mg Oral Q12H   clopidogrel  75 mg Oral Daily   folic acid  1 mg Oral Daily   gabapentin  100 mg Oral TID   insulin aspart  0-5 Units Subcutaneous QHS   insulin aspart  0-9 Units Subcutaneous TID WC   insulin glargine-yfgn  30 Units Subcutaneous Daily   irbesartan  150 mg Oral Daily    multivitamin with minerals  1 tablet Oral Daily   pantoprazole  40 mg Oral BID   senna-docusate  2 tablet Oral BID   silver sulfADIAZINE   Topical BID   spironolactone  25 mg Oral Daily   terazosin  2 mg Oral QHS   thiamine  100 mg Oral Daily   Continuous Infusions:   LOS: 16 days    Time spent:25 mins     Charise Killian, MD [facility] Pager [phone]  If 7PM-7AM, please contact night-coverage [email or web address] 10/15/2022, 1:52 PM

## 2022-10-16 LAB — GLUCOSE, CAPILLARY
Glucose-Capillary: 105 mg/dL — ABNORMAL HIGH (ref 70–99)
Glucose-Capillary: 130 mg/dL — ABNORMAL HIGH (ref 70–99)

## 2022-10-16 LAB — CBC
HCT: 24 % — ABNORMAL LOW (ref 39.0–52.0)
Hemoglobin: 7.7 g/dL — ABNORMAL LOW (ref 13.0–17.0)
MCH: 29.8 pg (ref 26.0–34.0)
MCHC: 32.1 g/dL (ref 30.0–36.0)
MCV: 93 fL (ref 80.0–100.0)
Platelets: 263 K/uL (ref 150–400)
RBC: 2.58 MIL/uL — ABNORMAL LOW (ref 4.22–5.81)
RDW: 14.4 % (ref 11.5–15.5)
WBC: 6.1 K/uL (ref 4.0–10.5)
nRBC: 0 % (ref 0.0–0.2)

## 2022-10-16 NOTE — Care Management Important Message (Signed)
Important Message  Patient Details  Name: William Ball MRN: [id number] Date of Birth: 1945-07-13   Medicare Important Message Given:  Yes     Olegario Messier A Adriyana Greenbaum 10/16/2022, 10:54 AM

## 2022-10-16 NOTE — Progress Notes (Deleted)
VOID

## 2022-12-22 ENCOUNTER — Other Ambulatory Visit (INDEPENDENT_AMBULATORY_CARE_PROVIDER_SITE_OTHER): Payer: Self-pay | Admitting: Nurse Practitioner

## 2022-12-22 ENCOUNTER — Ambulatory Visit (INDEPENDENT_AMBULATORY_CARE_PROVIDER_SITE_OTHER): Payer: Medicare HMO

## 2022-12-22 DIAGNOSIS — I82451 Acute embolism and thrombosis of right peroneal vein: Secondary | ICD-10-CM

## 2022-12-22 DIAGNOSIS — I739 Peripheral vascular disease, unspecified: Secondary | ICD-10-CM | POA: Diagnosis not present

## 2022-12-22 DIAGNOSIS — Z9889 Other specified postprocedural states: Secondary | ICD-10-CM

## 2022-12-23 LAB — VAS US ABI WITH/WO TBI
Left ABI: 1
Right ABI: 0.58

## 2022-12-29 ENCOUNTER — Encounter (INDEPENDENT_AMBULATORY_CARE_PROVIDER_SITE_OTHER): Payer: Self-pay | Admitting: Nurse Practitioner

## 2022-12-29 ENCOUNTER — Ambulatory Visit (INDEPENDENT_AMBULATORY_CARE_PROVIDER_SITE_OTHER): Payer: Medicare HMO | Admitting: Nurse Practitioner

## 2022-12-29 VITALS — BP 197/78 | HR 60 | Resp 18 | Ht 69.0 in | Wt 185.4 lb

## 2022-12-29 DIAGNOSIS — K922 Gastrointestinal hemorrhage, unspecified: Secondary | ICD-10-CM | POA: Diagnosis not present

## 2022-12-29 DIAGNOSIS — E785 Hyperlipidemia, unspecified: Secondary | ICD-10-CM

## 2022-12-29 DIAGNOSIS — I70221 Atherosclerosis of native arteries of extremities with rest pain, right leg: Secondary | ICD-10-CM

## 2022-12-29 DIAGNOSIS — I82451 Acute embolism and thrombosis of right peroneal vein: Secondary | ICD-10-CM | POA: Diagnosis not present

## 2022-12-29 MED ORDER — CLOPIDOGREL BISULFATE 75 MG PO TABS: 75.0000 mg | ORAL_TABLET | Freq: Every day | ORAL | 3 refills | Status: DC

## 2022-12-29 NOTE — H&P (View-Only) (Signed)
Subjective:    Patient ID: William Ball, male    DOB: [birth year], 77 y.o.   MRN: [id number] Chief Complaint  Patient presents with   Follow-up    pt having issues with right leg that surgery was done on wanting to come and be seen to check it out    William Ball is a 77 year old male who presents today for initial follow-up post intervention following bilateral femoral endarterectomy and leg.  The patient had a complicated postoperative wound course with a GI bleed.  He was originally supposed to have intervention done at [facility] however they remain full and the patient was discharged with the recommendation to follow-up outpatient.  The patient attempted to contact in order to schedule his procedure but he was told he was unable to do so.  He tried to contact them once again but received a phone number for since that time he has not called back.  He does note however that he has not had any significant melena or bright red bleeding per rectum.  Additionally the patient's anticoagulation has some question.  He notes that he is not taking Eliquis or Plavix.  The patient was placed on Eliquis due to the finding of DVT in the right peroneal vein.  He notes that he has pain in his right leg that has actually been ongoing since he left the hospital.  He notes it is difficult for him to walk.  He notes that his leg feels colder time.  He has also been dealing with swelling.  Today noninvasive studies show an ABI 0.58 on the right and 1.00 on the left.  There is dampened monophasic waveforms in the right tibial vessels on the left.  Additionally previous DVT noted in the right lower extremity is resolved.      Review of Systems  Skin:  Positive for color change.  All other systems reviewed and are negative.      Objective:   Physical Exam Vitals reviewed.  HENT:     Head: Normocephalic.  Cardiovascular:     Rate and Rhythm: Normal rate.  Pulmonary:     Effort: Pulmonary effort is  normal.  Musculoskeletal:     Right lower leg: Edema present.  Skin:    General: Skin is warm and dry.  Neurological:     Mental Status: He is alert and oriented to person, place, and time.  Psychiatric:        Mood and Affect: Mood normal.        Behavior: Behavior normal.        Thought Content: Thought content normal.        Judgment: Judgment normal.     BP (!) 197/78 (BP Location: Left Arm)   Pulse 60   Resp 18   Ht 5\' 9"  (1.753 m)   Wt 185 lb 6.4 oz (84.1 kg)   BMI 27.38 kg/m   Past Medical History:  Diagnosis Date   Diabetes mellitus without complication (HCC)    borderline   Hepatomegaly    Hyperlipidemia    Hypertension    Substance abuse (HCC)     Social History   Socioeconomic History   Marital status: Married    Spouse name: Not on file   Number of children: Not on file   Years of education: Not on file   Highest education level: 9th grade  Occupational History   Occupation: working full time   Tobacco Use   Smoking status:  Every Day    Current packs/day: 1.00    Average packs/day: 1 pack/day for 55.0 years (55.0 ttl pk-yrs)    Types: Cigarettes   Smokeless tobacco: Never  Vaping Use   Vaping status: Never Used  Substance and Sexual Activity   Alcohol use: Yes    Alcohol/week: 28.0 standard drinks of alcohol    Types: 28 Cans of beer per week    Comment: 4-6 daily beer   Drug use: No   Sexual activity: Not on file  Other Topics Concern   Not on file  Social History Narrative   Retired   International aid/development worker of Health   Financial Resource Strain: Patient Declined (10/21/2022)   Received from [facility], [facility]   Overall Financial Resource Strain (CARDIA)    Difficulty of Paying Living Expenses: Patient declined  Food Insecurity: Patient Declined (10/21/2022)   Received from [facility], [facility]   Hunger Vital Sign    Worried About Running Out of Food in the  Last Year: Patient declined    Ran Out of Food in the Last Year: Patient declined  Transportation Needs: Patient Declined (10/21/2022)   Received from [facility], [facility]   [facility] - Transportation    In the past 12 months, has lack of transportation kept you from medical appointments or from getting medications?: Patient declined    Lack of Transportation (Non-Medical): Patient declined  Physical Activity: Inactive (08/06/2017)   Exercise Vital Sign    Days of Exercise per Week: 0 days    Minutes of Exercise per Session: 0 min  Stress: No Stress Concern Present (08/06/2017)   [employer] of Occupational Health - Occupational Stress Questionnaire    Feeling of Stress : Not at all  Social Connections: Moderately Isolated (08/06/2017)   Social Connection and Isolation Panel [NHANES]    Frequency of Communication with Friends and Family: Never    Frequency of Social Gatherings with Friends and Family: Once a week    Attends Religious Services: Never    Database administrator or Organizations: No    Attends Banker Meetings: Never    Marital Status: Married  Catering manager Violence: Not At Risk (09/30/2022)   Humiliation, Afraid, Rape, and Kick questionnaire    Fear of Current or Ex-Partner: No    Emotionally Abused: No    Physically Abused: No    Sexually Abused: No    Past Surgical History:  Procedure Laterality Date   ANGIOPLASTY     APPENDECTOMY     BONE MARROW TRANSPLANT Left    ankle.  patient unsure of this but thinks this is what happened   CARDIAC SURGERY     CATARACT EXTRACTION W/PHACO Left 11/13/2015   Procedure: CATARACT EXTRACTION PHACO AND INTRAOCULAR LENS PLACEMENT (IOC);  Surgeon: Galen Manila, MD;  Location: [facility] ORS;  Service: Ophthalmology;  Laterality: Left;  [place] 1.06 AP% 23.9 CDE 15.81 Fluid pack lot # [id number]   CATARACT EXTRACTION W/PHACO Right 11/27/2015   Procedure: CATARACT EXTRACTION PHACO AND  INTRAOCULAR LENS PLACEMENT (IOC);  Surgeon: Galen Manila, MD;  Location: [facility] OR[facility];  Service: Ophthalmology;  Laterality: Right;  [place] 1.06 AP% 23.8 CDE 15.80 Fluid pack lot # [id number]   COLONOSCOPY  2012   polyps, diverticulosis , 23yr repeat   COLONOSCOPY WITH PROPOFOL N/A 07/17/2016   Procedure: COLONOSCOPY WITH PROPOFOL;  Surgeon: Wyline Mood, MD;  Location: [facility] ENDOSCOPY;  Service:  Endoscopy;  Laterality: N/A;   ENDARTERECTOMY FEMORAL Bilateral 09/10/2022   Procedure: ENDARTERECTOMY FEMORAL;  Surgeon: Annice Needy, MD;  Location: [facility] OR[facility];  Service: Vascular;  Laterality: Bilateral;   ESOPHAGOGASTRODUODENOSCOPY (EGD) WITH PROPOFOL N/A 09/30/2022   Procedure: ESOPHAGOGASTRODUODENOSCOPY (EGD) WITH PROPOFOL;  Surgeon: [name], MD;  Location: [facility] ENDOSCOPY;  Service: Endoscopy;  Laterality: N/A;   FRACTURE SURGERY Left 1982   foot, leg.unsure if metal in these parts.possibly replaced ankle   GIVENS CAPSULE STUDY N/A 10/03/2022   Procedure: GIVENS CAPSULE STUDY;  Surgeon: Wyline Mood, MD;  [facility];  Service: Gastroenterology;  Laterality: N/A;   INSERTION OF ILIAC STENT Bilateral 09/10/2022   Procedure: INSERTION OF ILIAC STENT;  Surgeon: Annice Needy, MD;  Location: [facility] OR[facility];  Service: Vascular;  Laterality: Bilateral;   LOWER EXTREMITY ANGIOGRAPHY Right 09/04/2022   Procedure: Lower Extremity Angiography;  Surgeon: Annice Needy, MD;  Location: [facility] INVASIVE CV LAB;  Service: Cardiovascular;  Laterality: Right;   PERIPHERAL VASCULAR CATHETERIZATION Left 12/27/2015   Procedure: Lower Extremity Angiography;  Surgeon: Annice Needy, MD;  Location: [facility] INVASIVE CV LAB;  Service: Cardiovascular;  Laterality: Left;    Family History  Problem Relation Age of Onset   Cancer Mother    Heart disease Mother    Hyperlipidemia Mother    Heart disease Father    Hyperlipidemia Father     Allergies  Allergen Reactions   Amlodipine Itching       Latest Ref Rng & Units 10/16/2022     4:45 AM 10/14/2022    7:47 AM 10/09/2022    9:21 AM  CBC  WBC 4.0 - 10.5 K/uL 6.1  6.1  5.9   Hemoglobin 13.0 - 17.0 g/dL 7.7  7.8  8.3   Hematocrit 39.0 - 52.0 % 24.0  24.2  25.5   Platelets 150 - 400 K/uL 263  249  286       CMP     Component Value Date/Time   NA 138 10/07/2022 0548   NA 134 06/18/2020 1556   K 4.5 10/07/2022 0548   CL 105 10/07/2022 0548   CO2 25 10/07/2022 0548   GLUCOSE 134 (H) 10/07/2022 0548   BUN 19 10/07/2022 0548   BUN 21 06/18/2020 1556   CREATININE 1.15 10/07/2022 0548   CALCIUM 8.5 (L) 10/07/2022 0548   PROT 7.1 09/29/2022 1827   PROT 7.5 09/26/2019 1603   ALBUMIN 3.8 09/29/2022 1827   ALBUMIN 4.6 09/26/2019 1603   AST 46 (H) 09/29/2022 1827   AST 36 02/18/2018 1528   ALT 97 (H) 09/29/2022 1827   ALT 44 02/18/2018 1528   ALKPHOS 91 09/29/2022 1827   BILITOT 0.7 09/29/2022 1827   BILITOT <0.2 09/26/2019 1603   GFRNONAA >60 10/07/2022 0548     VAS [place] ABI WITH/WO TBI  Result Date: 12/23/2022  LOWER EXTREMITY DOPPLER STUDY Patient Name:  William Ball  Date of Exam:   12/22/2022 Medical Rec #: [id number]      Accession #:    [id number] Date of Birth: 04/30/1946       Patient Gender: M Patient Age:   37 years Exam [facility] Procedure:      VAS [place] ABI WITH/WO TBI Referring Phys: Festus Barren --------------------------------------------------------------------------------  Indications: Peripheral artery disease, and C/O bilateral ankle pain (R>L).  Vascular Interventions: 12/27/15-Right CIA/EIA PTA & Left CIA/EIA stent. Comparison Study: 11/01/2019 Performing Technologist: Debbe Bales RVS  Examination Guidelines: A complete evaluation  includes at minimum, Doppler waveform signals and systolic blood pressure reading at the level of bilateral brachial, anterior tibial, and posterior tibial arteries, when vessel segments are accessible. Bilateral testing is considered an integral part of a complete examination. Photoelectric  Plethysmograph (PPG) waveforms and toe systolic pressure readings are included as required and additional duplex testing as needed. Limited examinations for reoccurring indications may be performed as noted.  ABI Findings: +---------+------------------+-----+--------+--------+ Right    Rt Pressure (mmHg)IndexWaveformComment  +---------+------------------+-----+--------+--------+ Brachial 164                                     +---------+------------------+-----+--------+--------+ ATA      94                0.57 biphasic         +---------+------------------+-----+--------+--------+ PTA      96                0.58 biphasic         +---------+------------------+-----+--------+--------+ Great Toe162               0.98 Normal           +---------+------------------+-----+--------+--------+ +---------+------------------+-----+---------+-------+ Left     Lt Pressure (mmHg)IndexWaveform Comment +---------+------------------+-----+---------+-------+ Brachial 165                                     +---------+------------------+-----+---------+-------+ ATA      156               0.95 triphasic        +---------+------------------+-----+---------+-------+ PTA      165               1.00 triphasic        +---------+------------------+-----+---------+-------+ Great Toe176               1.07 Normal           +---------+------------------+-----+---------+-------+ +-------+-----------+-----------+------------+------------+ ABI/TBIToday's ABIToday's TBIPrevious ABIPrevious TBI +-------+-----------+-----------+------------+------------+ Right  .58        .98        .82         .55          +-------+-----------+-----------+------------+------------+ Left   1.00       1.07       .85         .65          +-------+-----------+-----------+------------+------------+  Left ABIs and TBIs appear increased compared to prior study on 11/01/2019. Right ABIs appear  decreased compared to prior study on 11/01/2019. RT TBIs appear to be increased compared to prior study on 11/01/2019  Summary: Right: Resting right ankle-brachial index indicates moderate right lower extremity arterial disease. The right toe-brachial index is normal. Left: Resting left ankle-brachial index is within normal range. The left toe-brachial index is normal. *See table(s) above for measurements and observations.  Electronically signed by Festus Barren MD on 12/23/2022 at 10:35:56 AM.    Final        Assessment & Plan:   1. Atherosclerosis of native artery of right lower extremity with rest pain (HCC) Recommend:  The patient has evidence of severe atherosclerotic changes of both lower extremities with rest pain that is associated with preulcerative changes and impending tissue loss of the right foot.  This represents a limb threatening ischemia and places the patient  at the risk for right lower extremity limb loss.  Patient should undergo angiography of the right lower extremity with the hope for intervention for limb salvage.  The risks and benefits as well as the alternative therapies was discussed in detail with the patient.  All questions were answered.  Patient agrees to proceed with right lower extremity angiography.  The patient will follow up with me in the office after the procedure.      2. Deep venous thrombosis (DVT) of right peroneal vein, unspecified chronicity (HCC) This is resolved.  Given history apparently Eliquis will be held  3. Hyperlipidemia, unspecified hyperlipidemia type Continue statin as ordered and reviewed, no changes at this time  4. Upper GI bleed Currently the patient is not any evidence of GI bleed but given the fact that we will be restarting his Plavix due to his upcoming intervention I feel like it would be to his GI in case he does begin to bleed so there may be a plan in place.   Current Outpatient Medications on File Prior to Visit  Medication  Sig Dispense Refill   albuterol (VENTOLIN HFA) 108 (90 Base) MCG/ACT inhaler Inhale 1-2 puffs into the lungs every 6 (six) hours as needed for wheezing or shortness of breath.     aspirin EC 81 MG tablet Take 81 mg by mouth daily.     atorvastatin (LIPITOR) 40 MG tablet Take 40 mg by mouth daily.     carvedilol (COREG) 6.25 MG tablet Take 3.125 mg by mouth 2 (two) times daily.     chlorthalidone (HYGROTON) 50 MG tablet Take 1 tablet (50 mg total) by mouth daily. MUST BE SEEN IN CLINIC FOR FURTHER REFILLS ON MEDICATION 30 tablet 0   empagliflozin (JARDIANCE) 10 MG TABS tablet Take 10 mg by mouth daily.     folic acid (FOLVITE) 1 MG tablet Take 1 tablet (1 mg total) by mouth daily.     gabapentin (NEURONTIN) 100 MG capsule Take 100 mg by mouth 3 (three) times daily.     glipiZIDE (GLUCOTROL XL) 10 MG 24 hr tablet Take 20 mg by mouth daily.     insulin degludec (TRESIBA) 100 UNIT/ML FlexTouch Pen Inject 30 Units into the skin at bedtime.     Multiple Vitamin (MULTIVITAMIN WITH MINERALS) TABS tablet Take 1 tablet by mouth daily.     nicotine (NICODERM CQ - DOSED IN MG/24 HOURS) 21 mg/24hr patch Place 1 patch (21 mg total) onto the skin daily as needed (nicotine craving). 28 patch 0   Omega-3 Fatty Acids (FISH OIL) 1000 MG CAPS Take 1,000 mg by mouth daily.     oxyCODONE-acetaminophen (PERCOCET/ROXICET) 5-325 MG tablet Take 1 tablet by mouth every 4 (four) hours as needed for severe pain.     polyethylene glycol (MIRALAX / GLYCOLAX) 17 g packet Take 17 g by mouth 2 (two) times daily. 14 each 0   senna-docusate (SENOKOT-S) 8.6-50 MG tablet Take 2 tablets by mouth 2 (two) times daily.     silver sulfADIAZINE (SILVADENE) 1 % cream Apply topically 2 (two) times daily. 50 g 0   spironolactone (ALDACTONE) 25 MG tablet Take 25 mg by mouth daily.     terazosin (HYTRIN) 2 MG capsule Take 2 mg by mouth at bedtime.     thiamine (VITAMIN B-1) 100 MG tablet Take 1 tablet (100 mg total) by mouth daily.      valsartan (DIOVAN) 320 MG tablet Take 160 mg by mouth daily.  No current facility-administered medications on file prior to visit.    There are no Patient Instructions on file for this visit. No follow-ups on file.   Georgiana Spinner, NP

## 2022-12-29 NOTE — Progress Notes (Signed)
Subjective:    Patient ID: William Ball, male    DOB: [birth year], 77 y.o.   MRN: [id number] Chief Complaint  Patient presents with   Follow-up    pt having issues with right leg that surgery was done on wanting to come and be seen to check it out    William Ball is a 77 year old male who presents today for initial follow-up post intervention following bilateral femoral endarterectomy and leg.  The patient had a complicated postoperative wound course with a GI bleed.  He was originally supposed to have intervention done at [facility] however they remain full and the patient was discharged with the recommendation to follow-up outpatient.  The patient attempted to contact in order to schedule his procedure but he was told he was unable to do so.  He tried to contact them once again but received a phone number for since that time he has not called back.  He does note however that he has not had any significant melena or bright red bleeding per rectum.  Additionally the patient's anticoagulation has some question.  He notes that he is not taking Eliquis or Plavix.  The patient was placed on Eliquis due to the finding of DVT in the right peroneal vein.  He notes that he has pain in his right leg that has actually been ongoing since he left the hospital.  He notes it is difficult for him to walk.  He notes that his leg feels colder time.  He has also been dealing with swelling.  Today noninvasive studies show an ABI 0.58 on the right and 1.00 on the left.  There is dampened monophasic waveforms in the right tibial vessels on the left.  Additionally previous DVT noted in the right lower extremity is resolved.      Review of Systems  Skin:  Positive for color change.  All other systems reviewed and are negative.      Objective:   Physical Exam Vitals reviewed.  HENT:     Head: Normocephalic.  Cardiovascular:     Rate and Rhythm: Normal rate.  Pulmonary:     Effort: Pulmonary effort is  normal.  Musculoskeletal:     Right lower leg: Edema present.  Skin:    General: Skin is warm and dry.  Neurological:     Mental Status: He is alert and oriented to person, place, and time.  Psychiatric:        Mood and Affect: Mood normal.        Behavior: Behavior normal.        Thought Content: Thought content normal.        Judgment: Judgment normal.     BP (!) 197/78 (BP Location: Left Arm)   Pulse 60   Resp 18   Ht 5\' 9"  (1.753 m)   Wt 185 lb 6.4 oz (84.1 kg)   BMI 27.38 kg/m   Past Medical History:  Diagnosis Date   Diabetes mellitus without complication (HCC)    borderline   Hepatomegaly    Hyperlipidemia    Hypertension    Substance abuse (HCC)     Social History   Socioeconomic History   Marital status: Married    Spouse name: Not on file   Number of children: Not on file   Years of education: Not on file   Highest education level: 9th grade  Occupational History   Occupation: working full time   Tobacco Use   Smoking status:  Every Day    Current packs/day: 1.00    Average packs/day: 1 pack/day for 55.0 years (55.0 ttl pk-yrs)    Types: Cigarettes   Smokeless tobacco: Never  Vaping Use   Vaping status: Never Used  Substance and Sexual Activity   Alcohol use: Yes    Alcohol/week: 28.0 standard drinks of alcohol    Types: 28 Cans of beer per week    Comment: 4-6 daily beer   Drug use: No   Sexual activity: Not on file  Other Topics Concern   Not on file  Social History Narrative   Retired   International aid/development worker of Health   Financial Resource Strain: Patient Declined (10/21/2022)   Received from [facility], [facility]   Overall Financial Resource Strain (CARDIA)    Difficulty of Paying Living Expenses: Patient declined  Food Insecurity: Patient Declined (10/21/2022)   Received from [facility], [facility]   Hunger Vital Sign    Worried About Running Out of Food in the  Last Year: Patient declined    Ran Out of Food in the Last Year: Patient declined  Transportation Needs: Patient Declined (10/21/2022)   Received from [facility], [facility]   [facility] - Transportation    In the past 12 months, has lack of transportation kept you from medical appointments or from getting medications?: Patient declined    Lack of Transportation (Non-Medical): Patient declined  Physical Activity: Inactive (08/06/2017)   Exercise Vital Sign    Days of Exercise per Week: 0 days    Minutes of Exercise per Session: 0 min  Stress: No Stress Concern Present (08/06/2017)   [employer] of Occupational Health - Occupational Stress Questionnaire    Feeling of Stress : Not at all  Social Connections: Moderately Isolated (08/06/2017)   Social Connection and Isolation Panel [NHANES]    Frequency of Communication with Friends and Family: Never    Frequency of Social Gatherings with Friends and Family: Once a week    Attends Religious Services: Never    Database administrator or Organizations: No    Attends Banker Meetings: Never    Marital Status: Married  Catering manager Violence: Not At Risk (09/30/2022)   Humiliation, Afraid, Rape, and Kick questionnaire    Fear of Current or Ex-Partner: No    Emotionally Abused: No    Physically Abused: No    Sexually Abused: No    Past Surgical History:  Procedure Laterality Date   ANGIOPLASTY     APPENDECTOMY     BONE MARROW TRANSPLANT Left    ankle.  patient unsure of this but thinks this is what happened   CARDIAC SURGERY     CATARACT EXTRACTION W/PHACO Left 11/13/2015   Procedure: CATARACT EXTRACTION PHACO AND INTRAOCULAR LENS PLACEMENT (IOC);  Surgeon: Galen Manila, MD;  Location: [facility] ORS;  Service: Ophthalmology;  Laterality: Left;  [place] 1.06 AP% 23.9 CDE 15.81 Fluid pack lot # [id number]   CATARACT EXTRACTION W/PHACO Right 11/27/2015   Procedure: CATARACT EXTRACTION PHACO AND  INTRAOCULAR LENS PLACEMENT (IOC);  Surgeon: Galen Manila, MD;  Location: [facility] ORS;  Service: Ophthalmology;  Laterality: Right;  [place] 1.06 AP% 23.8 CDE 15.80 Fluid pack lot # [id number]   COLONOSCOPY  2012   polyps, diverticulosis , 23yr repeat   COLONOSCOPY WITH PROPOFOL N/A 07/17/2016   Procedure: COLONOSCOPY WITH PROPOFOL;  Surgeon: Wyline Mood, MD;  Location: [facility] ENDOSCOPY;  Service:  Endoscopy;  Laterality: N/A;   ENDARTERECTOMY FEMORAL Bilateral 09/10/2022   Procedure: ENDARTERECTOMY FEMORAL;  Surgeon: Annice Needy, MD;  Location: [facility] ORS;  Service: Vascular;  Laterality: Bilateral;   ESOPHAGOGASTRODUODENOSCOPY (EGD) WITH PROPOFOL N/A 09/30/2022   Procedure: ESOPHAGOGASTRODUODENOSCOPY (EGD) WITH PROPOFOL;  Surgeon: [name], MD;  Location: [facility] ENDOSCOPY;  Service: Endoscopy;  Laterality: N/A;   FRACTURE SURGERY Left 1982   foot, leg.unsure if metal in these parts.possibly replaced ankle   GIVENS CAPSULE STUDY N/A 10/03/2022   Procedure: GIVENS CAPSULE STUDY;  Surgeon: Wyline Mood, MD;  Location[facility];  Service: Gastroenterology;  Laterality: N/A;   INSERTION OF ILIAC STENT Bilateral 09/10/2022   Procedure: INSERTION OF ILIAC STENT;  Surgeon: Annice Needy, MD;  Location: [facility] ORS;  Service: Vascular;  Laterality: Bilateral;   LOWER EXTREMITY ANGIOGRAPHY Right 09/04/2022   Procedure: Lower Extremity Angiography;  Surgeon: Annice Needy, MD;  Location: [facility] INVASIVE CV LAB;  Service: Cardiovascular;  Laterality: Right;   PERIPHERAL VASCULAR CATHETERIZATION Left 12/27/2015   Procedure: Lower Extremity Angiography;  Surgeon: Annice Needy, MD;  Location: [facility] INVASIVE CV LAB;  Service: Cardiovascular;  Laterality: Left;    Family History  Problem Relation Age of Onset   Cancer Mother    Heart disease Mother    Hyperlipidemia Mother    Heart disease Father    Hyperlipidemia Father     Allergies  Allergen Reactions   Amlodipine Itching       Latest Ref Rng & Units 10/16/2022     4:45 AM 10/14/2022    7:47 AM 10/09/2022    9:21 AM  CBC  WBC 4.0 - 10.5 K/uL 6.1  6.1  5.9   Hemoglobin 13.0 - 17.0 g/dL 7.7  7.8  8.3   Hematocrit 39.0 - 52.0 % 24.0  24.2  25.5   Platelets 150 - 400 K/uL 263  249  286       CMP     Component Value Date/Time   NA 138 10/07/2022 0548   NA 134 06/18/2020 1556   K 4.5 10/07/2022 0548   CL 105 10/07/2022 0548   CO2 25 10/07/2022 0548   GLUCOSE 134 (H) 10/07/2022 0548   BUN 19 10/07/2022 0548   BUN 21 06/18/2020 1556   CREATININE 1.15 10/07/2022 0548   CALCIUM 8.5 (L) 10/07/2022 0548   PROT 7.1 09/29/2022 1827   PROT 7.5 09/26/2019 1603   ALBUMIN 3.8 09/29/2022 1827   ALBUMIN 4.6 09/26/2019 1603   AST 46 (H) 09/29/2022 1827   AST 36 02/18/2018 1528   ALT 97 (H) 09/29/2022 1827   ALT 44 02/18/2018 1528   ALKPHOS 91 09/29/2022 1827   BILITOT 0.7 09/29/2022 1827   BILITOT <0.2 09/26/2019 1603   GFRNONAA >60 10/07/2022 0548     VAS [place] ABI WITH/WO TBI  Result Date: 12/23/2022  LOWER EXTREMITY DOPPLER STUDY Patient Name:  CORDARA BAGBY  Date of Exam:   12/22/2022 Medical Rec #: [id number]      Accession #:    [id number] Date of Birth: 04/30/1946       Patient Gender: M Patient Age:   37 years Exam [facility] Procedure:      VAS [place] ABI WITH/WO TBI Referring Phys: Festus Barren --------------------------------------------------------------------------------  Indications: Peripheral artery disease, and C/O bilateral ankle pain (R>L).  Vascular Interventions: 12/27/15-Right CIA/EIA PTA & Left CIA/EIA stent. Comparison Study: 11/01/2019 Performing Technologist: Debbe Bales RVS  Examination Guidelines: A complete evaluation  includes at minimum, Doppler waveform signals and systolic blood pressure reading at the level of bilateral brachial, anterior tibial, and posterior tibial arteries, when vessel segments are accessible. Bilateral testing is considered an integral part of a complete examination. Photoelectric  Plethysmograph (PPG) waveforms and toe systolic pressure readings are included as required and additional duplex testing as needed. Limited examinations for reoccurring indications may be performed as noted.  ABI Findings: +---------+------------------+-----+--------+--------+ Right    Rt Pressure (mmHg)IndexWaveformComment  +---------+------------------+-----+--------+--------+ Brachial 164                                     +---------+------------------+-----+--------+--------+ ATA      94                0.57 biphasic         +---------+------------------+-----+--------+--------+ PTA      96                0.58 biphasic         +---------+------------------+-----+--------+--------+ Great Toe162               0.98 Normal           +---------+------------------+-----+--------+--------+ +---------+------------------+-----+---------+-------+ Left     Lt Pressure (mmHg)IndexWaveform Comment +---------+------------------+-----+---------+-------+ Brachial 165                                     +---------+------------------+-----+---------+-------+ ATA      156               0.95 triphasic        +---------+------------------+-----+---------+-------+ PTA      165               1.00 triphasic        +---------+------------------+-----+---------+-------+ Great Toe176               1.07 Normal           +---------+------------------+-----+---------+-------+ +-------+-----------+-----------+------------+------------+ ABI/TBIToday's ABIToday's TBIPrevious ABIPrevious TBI +-------+-----------+-----------+------------+------------+ Right  .58        .98        .82         .55          +-------+-----------+-----------+------------+------------+ Left   1.00       1.07       .85         .65          +-------+-----------+-----------+------------+------------+  Left ABIs and TBIs appear increased compared to prior study on 11/01/2019. Right ABIs appear  decreased compared to prior study on 11/01/2019. RT TBIs appear to be increased compared to prior study on 11/01/2019  Summary: Right: Resting right ankle-brachial index indicates moderate right lower extremity arterial disease. The right toe-brachial index is normal. Left: Resting left ankle-brachial index is within normal range. The left toe-brachial index is normal. *See table(s) above for measurements and observations.  Electronically signed by Festus Barren MD on 12/23/2022 at 10:35:56 AM.    Final        Assessment & Plan:   1. Atherosclerosis of native artery of right lower extremity with rest pain (HCC) Recommend:  The patient has evidence of severe atherosclerotic changes of both lower extremities with rest pain that is associated with preulcerative changes and impending tissue loss of the right foot.  This represents a limb threatening ischemia and places the patient  at the risk for right lower extremity limb loss.  Patient should undergo angiography of the right lower extremity with the hope for intervention for limb salvage.  The risks and benefits as well as the alternative therapies was discussed in detail with the patient.  All questions were answered.  Patient agrees to proceed with right lower extremity angiography.  The patient will follow up with me in the office after the procedure.      2. Deep venous thrombosis (DVT) of right peroneal vein, unspecified chronicity (HCC) This is resolved.  Given history apparently Eliquis will be held  3. Hyperlipidemia, unspecified hyperlipidemia type Continue statin as ordered and reviewed, no changes at this time  4. Upper GI bleed Currently the patient is not any evidence of GI bleed but given the fact that we will be restarting his Plavix due to his upcoming intervention I feel like it would be to his GI in case he does begin to bleed so there may be a plan in place.   Current Outpatient Medications on File Prior to Visit  Medication  Sig Dispense Refill   albuterol (VENTOLIN HFA) 108 (90 Base) MCG/ACT inhaler Inhale 1-2 puffs into the lungs every 6 (six) hours as needed for wheezing or shortness of breath.     aspirin EC 81 MG tablet Take 81 mg by mouth daily.     atorvastatin (LIPITOR) 40 MG tablet Take 40 mg by mouth daily.     carvedilol (COREG) 6.25 MG tablet Take 3.125 mg by mouth 2 (two) times daily.     chlorthalidone (HYGROTON) 50 MG tablet Take 1 tablet (50 mg total) by mouth daily. MUST BE SEEN IN CLINIC FOR FURTHER REFILLS ON MEDICATION 30 tablet 0   empagliflozin (JARDIANCE) 10 MG TABS tablet Take 10 mg by mouth daily.     folic acid (FOLVITE) 1 MG tablet Take 1 tablet (1 mg total) by mouth daily.     gabapentin (NEURONTIN) 100 MG capsule Take 100 mg by mouth 3 (three) times daily.     glipiZIDE (GLUCOTROL XL) 10 MG 24 hr tablet Take 20 mg by mouth daily.     insulin degludec (TRESIBA) 100 UNIT/ML FlexTouch Pen Inject 30 Units into the skin at bedtime.     Multiple Vitamin (MULTIVITAMIN WITH MINERALS) TABS tablet Take 1 tablet by mouth daily.     nicotine (NICODERM CQ - DOSED IN MG/24 HOURS) 21 mg/24hr patch Place 1 patch (21 mg total) onto the skin daily as needed (nicotine craving). 28 patch 0   Omega-3 Fatty Acids (FISH OIL) 1000 MG CAPS Take 1,000 mg by mouth daily.     oxyCODONE-acetaminophen (PERCOCET/ROXICET) 5-325 MG tablet Take 1 tablet by mouth every 4 (four) hours as needed for severe pain.     polyethylene glycol (MIRALAX / GLYCOLAX) 17 g packet Take 17 g by mouth 2 (two) times daily. 14 each 0   senna-docusate (SENOKOT-S) 8.6-50 MG tablet Take 2 tablets by mouth 2 (two) times daily.     silver sulfADIAZINE (SILVADENE) 1 % cream Apply topically 2 (two) times daily. 50 g 0   spironolactone (ALDACTONE) 25 MG tablet Take 25 mg by mouth daily.     terazosin (HYTRIN) 2 MG capsule Take 2 mg by mouth at bedtime.     thiamine (VITAMIN B-1) 100 MG tablet Take 1 tablet (100 mg total) by mouth daily.      valsartan (DIOVAN) 320 MG tablet Take 160 mg by mouth daily.  No current facility-administered medications on file prior to visit.    There are no Patient Instructions on file for this visit. No follow-ups on file.   Georgiana Spinner, NP

## 2023-01-02 ENCOUNTER — Encounter (INDEPENDENT_AMBULATORY_CARE_PROVIDER_SITE_OTHER): Payer: Self-pay | Admitting: Nurse Practitioner

## 2023-01-02 ENCOUNTER — Telehealth (INDEPENDENT_AMBULATORY_CARE_PROVIDER_SITE_OTHER): Payer: Self-pay

## 2023-01-02 MED ORDER — CLOPIDOGREL BISULFATE 75 MG PO TABS: 75.0000 mg | ORAL_TABLET | Freq: Every day | ORAL | 3 refills | Status: AC

## 2023-01-02 NOTE — Telephone Encounter (Signed)
Spoke with the patient and he is scheduled with Dr. Wyn Quaker on 01/12/23 with a 12:15 pm arrival time to the [facility] for a RLE angio. Pre-procedure instructions were discussed and will be sent to Mychart and mailed.

## 2023-01-12 ENCOUNTER — Ambulatory Visit
Admission: RE | Admit: 2023-01-12 | Discharge: 2023-01-12 | Disposition: A | Discharge status: Home / Self Care | Payer: Medicare HMO | Source: Ambulatory Visit | Attending: Vascular Surgery | Admitting: Vascular Surgery

## 2023-01-12 ENCOUNTER — Encounter
Admission: RE | Disposition: A | Discharge status: Home / Self Care | Payer: Self-pay | Source: Ambulatory Visit | Attending: Vascular Surgery

## 2023-01-12 DIAGNOSIS — Z7901 Long term (current) use of anticoagulants: Secondary | ICD-10-CM | POA: Diagnosis not present

## 2023-01-12 DIAGNOSIS — I10 Essential (primary) hypertension: Secondary | ICD-10-CM | POA: Insufficient documentation

## 2023-01-12 DIAGNOSIS — Z794 Long term (current) use of insulin: Secondary | ICD-10-CM | POA: Insufficient documentation

## 2023-01-12 DIAGNOSIS — Z86718 Personal history of other venous thrombosis and embolism: Secondary | ICD-10-CM | POA: Insufficient documentation

## 2023-01-12 DIAGNOSIS — E785 Hyperlipidemia, unspecified: Secondary | ICD-10-CM | POA: Diagnosis not present

## 2023-01-12 DIAGNOSIS — Z9889 Other specified postprocedural states: Secondary | ICD-10-CM | POA: Diagnosis not present

## 2023-01-12 DIAGNOSIS — Z8719 Personal history of other diseases of the digestive system: Secondary | ICD-10-CM | POA: Insufficient documentation

## 2023-01-12 DIAGNOSIS — Z8249 Family history of ischemic heart disease and other diseases of the circulatory system: Secondary | ICD-10-CM | POA: Diagnosis not present

## 2023-01-12 DIAGNOSIS — F17210 Nicotine dependence, cigarettes, uncomplicated: Secondary | ICD-10-CM | POA: Insufficient documentation

## 2023-01-12 DIAGNOSIS — Z7984 Long term (current) use of oral hypoglycemic drugs: Secondary | ICD-10-CM | POA: Insufficient documentation

## 2023-01-12 DIAGNOSIS — I70221 Atherosclerosis of native arteries of extremities with rest pain, right leg: Secondary | ICD-10-CM | POA: Insufficient documentation

## 2023-01-12 DIAGNOSIS — E1151 Type 2 diabetes mellitus with diabetic peripheral angiopathy without gangrene: Secondary | ICD-10-CM | POA: Diagnosis not present

## 2023-01-12 DIAGNOSIS — Z95828 Presence of other vascular implants and grafts: Secondary | ICD-10-CM

## 2023-01-12 DIAGNOSIS — I70229 Atherosclerosis of native arteries of extremities with rest pain, unspecified extremity: Secondary | ICD-10-CM

## 2023-01-12 HISTORY — PX: LOWER EXTREMITY ANGIOGRAPHY: CATH118251

## 2023-01-12 LAB — CBC WITH DIFFERENTIAL/PLATELET
Abs Immature Granulocytes: 0.02 K/uL (ref 0.00–0.07)
Basophils Absolute: 0.1 K/uL (ref 0.0–0.1)
Basophils Relative: 1 %
Eosinophils Absolute: 0.5 K/uL (ref 0.0–0.5)
Eosinophils Relative: 6 %
HCT: 34 % — ABNORMAL LOW (ref 39.0–52.0)
Hemoglobin: 10.8 g/dL — ABNORMAL LOW (ref 13.0–17.0)
Immature Granulocytes: 0 %
Lymphocytes Relative: 30 %
Lymphs Abs: 2.3 K/uL (ref 0.7–4.0)
MCH: 26.1 pg (ref 26.0–34.0)
MCHC: 31.8 g/dL (ref 30.0–36.0)
MCV: 82.1 fL (ref 80.0–100.0)
Monocytes Absolute: 0.6 K/uL (ref 0.1–1.0)
Monocytes Relative: 7 %
Neutro Abs: 4.3 K/uL (ref 1.7–7.7)
Neutrophils Relative %: 56 %
Platelets: 362 K/uL (ref 150–400)
RBC: 4.14 MIL/uL — ABNORMAL LOW (ref 4.22–5.81)
RDW: 14.6 % (ref 11.5–15.5)
WBC: 7.8 K/uL (ref 4.0–10.5)
nRBC: 0 % (ref 0.0–0.2)

## 2023-01-12 LAB — GLUCOSE, CAPILLARY: Glucose-Capillary: 107 mg/dL — ABNORMAL HIGH (ref 70–99)

## 2023-01-12 LAB — CREATININE, SERUM
Creatinine, Ser: 1.19 mg/dL (ref 0.61–1.24)
GFR, Estimated: >60 mL/min

## 2023-01-12 LAB — BUN: BUN: 28 mg/dL — ABNORMAL HIGH (ref 8–23)

## 2023-01-12 SURGERY — LOWER EXTREMITY ANGIOGRAPHY
Anesthesia: Moderate Sedation | Site: Leg Lower | Laterality: Right

## 2023-01-12 MED ORDER — HEPARIN SODIUM (PORCINE) 1000 UNIT/ML IJ SOLN
INTRAMUSCULAR | Status: AC
  Filled 2023-01-12: qty 10

## 2023-01-12 MED ORDER — DIPHENHYDRAMINE HCL 50 MG/ML IJ SOLN
INTRAMUSCULAR | Status: AC
  Filled 2023-01-12: qty 1

## 2023-01-12 MED ORDER — ONDANSETRON HCL 4 MG/2ML IJ SOLN: 4.0000 mg | Freq: Four times a day (QID) | INTRAMUSCULAR | Status: DC | PRN

## 2023-01-12 MED ORDER — MIDAZOLAM HCL 5 MG/5ML IJ SOLN
INTRAMUSCULAR | Status: AC
  Filled 2023-01-12: qty 5

## 2023-01-12 MED ORDER — SODIUM CHLORIDE 0.9% FLUSH: 3.0000 mL | INTRAVENOUS | Status: DC | PRN

## 2023-01-12 MED ORDER — SODIUM CHLORIDE 0.9% FLUSH: 3.0000 mL | Freq: Two times a day (BID) | INTRAVENOUS | Status: DC

## 2023-01-12 MED ORDER — CLOPIDOGREL BISULFATE 75 MG PO TABS
75.0000 mg | ORAL_TABLET | Freq: Every day | ORAL | Status: DC
  Administered 2023-01-12: 75 mg via ORAL

## 2023-01-12 MED ORDER — LABETALOL HCL 5 MG/ML IV SOLN: 10.0000 mg | INTRAVENOUS | Status: DC | PRN

## 2023-01-12 MED ORDER — ACETAMINOPHEN 325 MG PO TABS: 650.0000 mg | ORAL_TABLET | ORAL | Status: DC | PRN

## 2023-01-12 MED ORDER — SODIUM CHLORIDE 0.9 % IV SOLN: 250.0000 mL | INTRAVENOUS | Status: DC | PRN

## 2023-01-12 MED ORDER — MIDAZOLAM HCL 2 MG/2ML IJ SOLN
INTRAMUSCULAR | Status: DC | PRN
  Administered 2023-01-12: 2 mg via INTRAVENOUS
  Administered 2023-01-12: 1 mg via INTRAVENOUS
  Administered 2023-01-12: .5 mg via INTRAVENOUS

## 2023-01-12 MED ORDER — FAMOTIDINE 20 MG PO TABS: 40.0000 mg | ORAL_TABLET | Freq: Once | ORAL | Status: DC | PRN

## 2023-01-12 MED ORDER — DIPHENHYDRAMINE HCL 50 MG/ML IJ SOLN: 50.0000 mg | Freq: Once | INTRAMUSCULAR | Status: DC | PRN

## 2023-01-12 MED ORDER — HYDRALAZINE HCL 20 MG/ML IJ SOLN: 5.0000 mg | INTRAMUSCULAR | Status: DC | PRN

## 2023-01-12 MED ORDER — FENTANYL CITRATE (PF) 100 MCG/2ML IJ SOLN
INTRAMUSCULAR | Status: AC
  Filled 2023-01-12: qty 2

## 2023-01-12 MED ORDER — SODIUM CHLORIDE 0.9 % IV SOLN: INTRAVENOUS | Status: DC

## 2023-01-12 MED ORDER — CLOPIDOGREL BISULFATE 75 MG PO TABS: 75.0000 mg | ORAL_TABLET | Freq: Every day | ORAL | 11 refills | Status: AC

## 2023-01-12 MED ORDER — HYDROMORPHONE HCL 1 MG/ML IJ SOLN: 1.0000 mg | Freq: Once | INTRAMUSCULAR | Status: DC | PRN

## 2023-01-12 MED ORDER — METHYLPREDNISOLONE SODIUM SUCC 125 MG IJ SOLR: 125.0000 mg | Freq: Once | INTRAMUSCULAR | Status: DC | PRN

## 2023-01-12 MED ORDER — DIPHENHYDRAMINE HCL 50 MG/ML IJ SOLN
INTRAMUSCULAR | Status: DC | PRN
  Administered 2023-01-12: 50 mg via INTRAVENOUS

## 2023-01-12 MED ORDER — CEFAZOLIN SODIUM-DEXTROSE 2-4 GM/100ML-% IV SOLN
2.0000 g | INTRAVENOUS | Status: AC
  Administered 2023-01-12: 2 g via INTRAVENOUS

## 2023-01-12 MED ORDER — HEPARIN (PORCINE) IN NACL 1000-0.9 UT/500ML-% IV SOLN
INTRAVENOUS | Status: DC | PRN
  Administered 2023-01-12: 1000 mL

## 2023-01-12 MED ORDER — LIDOCAINE-EPINEPHRINE (PF) 1 %-1:200000 IJ SOLN
INTRAMUSCULAR | Status: DC | PRN
  Administered 2023-01-12: 10 mL via INTRADERMAL

## 2023-01-12 MED ORDER — HEPARIN SODIUM (PORCINE) 1000 UNIT/ML IJ SOLN
INTRAMUSCULAR | Status: DC | PRN
  Administered 2023-01-12: 5000 [IU] via INTRAVENOUS

## 2023-01-12 MED ORDER — CEFAZOLIN SODIUM-DEXTROSE 2-4 GM/100ML-% IV SOLN
INTRAVENOUS | Status: AC
  Filled 2023-01-12: qty 100

## 2023-01-12 MED ORDER — CLOPIDOGREL BISULFATE 75 MG PO TABS
ORAL_TABLET | ORAL | Status: AC
  Filled 2023-01-12: qty 1

## 2023-01-12 MED ORDER — FENTANYL CITRATE (PF) 100 MCG/2ML IJ SOLN
INTRAMUSCULAR | Status: DC | PRN
  Administered 2023-01-12 (×2): 25 ug via INTRAVENOUS
  Administered 2023-01-12: 50 ug via INTRAVENOUS

## 2023-01-12 MED ORDER — MIDAZOLAM HCL 2 MG/ML PO SYRP: 8.0000 mg | ORAL_SOLUTION | Freq: Once | ORAL | Status: DC | PRN

## 2023-01-12 SURGICAL SUPPLY — 17 items
BALLN LUTONIX 018 5X300X130 (BALLOONS) ×1
BALLON DORADO 5X100X135 (BALLOONS) ×1
CANNULA 5F STIFF (CANNULA) ×1
CATH ANGIO 5F PIGTAIL 65CM (CATHETERS) ×1
CATH BEACON 5 .038 100 VERT TP (CATHETERS) ×1
COVER PROBE ULTRASOUND 5X96 (MISCELLANEOUS) ×1
DEVICE STARCLOSE SE CLOSURE (Vascular Products) ×1 IMPLANT
GLIDEWIRE ADV .035X260CM (WIRE) ×1
KIT ENCORE 26 ADVANTAGE (KITS) ×1
PACK ANGIOGRAPHY (CUSTOM PROCEDURE TRAY) ×1
SHEATH ANL2 6FRX45 HC (SHEATH) ×1
SHEATH BRITE TIP 5FRX11 (SHEATH) ×1
STENT VIABAHN 6X100X120 (Permanent Stent) ×1 IMPLANT
SYR MEDRAD MARK 7 150ML (SYRINGE) ×1
TUBING CONTRAST HIGH PRESS 72 (TUBING) ×1
WIRE G V18X300CM (WIRE) ×1
WIRE GUIDERIGHT .035X150 (WIRE) IMPLANT

## 2023-01-12 NOTE — Op Note (Signed)
William Ball  Percutaneous Study/Intervention Procedural Note   Date of Surgery: 01/12/2023  Surgeon(s):Kashayla Ungerer    Assistants:none  Pre-operative Diagnosis: PAD with rest pain right lower extremity  Post-operative diagnosis:  Same  Procedure(s) Performed:             1.  Ultrasound guidance for vascular access left femoral artery             2.  Catheter placement into right common femoral artery from left femoral approach             3.  Aortogram and selective right lower extremity angiogram             4.  Percutaneous transluminal angioplasty of right SFA and proximal popliteal artery with 5 mm diameter by 30 cm length Lutonix drug-coated angioplasty balloon             5.  Stent placement to the right SFA with 6 mm diameter by 10 cm length Viabahn stent for 2 areas of greater than 50% residual stenosis after angioplasty  6.  StarClose closure device left femoral artery  EBL: 5 cc  Contrast: 45 cc  Fluoro Time: 4.8 minutes  Moderate Conscious Sedation Time: approximately 41 minutes using 3.5 mg of Versed and 100 mcg of Fentanyl              Indications:  Patient is a 77 y.o.male with a known history of severe peripheral arterial disease status post bilateral femoral endarterectomies and bilateral iliac revascularizations. The patient has noninvasive study showing significantly reduced right ABI with a relatively normal left ABI and symptoms still consistent with rest pain on the right. The patient is brought in for angiography for further evaluation and potential treatment.  Due to the limb threatening nature of the situation, angiogram was performed for attempted limb salvage. The patient is aware that if the procedure fails, amputation would be expected.  The patient also understands that even with successful revascularization, amputation may still be required due to the severity of the situation.  Risks and benefits are discussed and informed consent is  obtained.   Procedure:  The patient was identified and appropriate procedural time out was performed.  The patient was then placed supine on the table and prepped and draped in the usual sterile fashion. Moderate conscious sedation was administered during a face to face encounter with the patient throughout the procedure with my supervision of the RN administering medicines and monitoring the patient's vital signs, pulse oximetry, telemetry and mental status throughout from the start of the procedure until the patient was taken to the recovery room. Ultrasound was used to evaluate the left common femoral artery.  It was patent .  A digital ultrasound image was acquired.  A Seldinger needle was used to access the left common femoral artery under direct ultrasound guidance and a permanent image was performed.  A 0.035 J wire was advanced without resistance and a 5Fr sheath was placed.  Pigtail catheter was placed into the aorta and an AP aortogram was performed. This demonstrated normal renal arteries and normal aorta and iliac segments without significant stenosis after previous iliac stenting.  The stents were all widely patent. I then crossed the aortic bifurcation and advanced to the right femoral head. Selective right lower extremity angiogram was then performed. This demonstrated normal common femoral artery, profunda femoris artery, and no significant stenosis at the origin of the superficial femoral artery after previous endarterectomy.  The SFA  had near occlusive stenosis in the proximal to mid segment, the mid segment, and then a moderate stenosis at Hunter's canal in the 60 to 70% range.  The mid and distal popliteal artery were fairly normal.  There was then normal tibial trifurcation with normal tibials distally without focal stenosis. It was felt that it was in the patient's best interest to proceed with intervention after these images to avoid a second procedure and a larger amount of contrast and  fluoroscopy based off of the findings from the initial angiogram. The patient was systemically heparinized and a 6 [place] Ansell sheath was then placed over the [employer] wire. I then used a Kumpe catheter and the advantage wire to navigate through the SFA and most proximal popliteal lesions and parked the wire distally.  I then exchanged for a V18 wire over the Kumpe catheter.  A 5 mm diameter by 30 cm length Lutonix drug-coated angioplasty balloon was inflated in the SFA and most proximal popliteal artery and taken up to 8 atm for 1 minute.  The more distal lesion only had about a 20% residual stenosis but the 2 stenoses in the proximal to mid and mid segment still remained greater than 50%.  I elected to stent the area.  A 6 mm diameter by 10 cm length Viabahn stent was deployed and postdilated with a 5 mm diameter high-pressure angioplasty balloon with less than 10% residual stenosis after stent placement. I elected to terminate the procedure. The sheath was removed and StarClose closure device was deployed in the left femoral artery with excellent hemostatic result. The patient was taken to the recovery room in stable condition having tolerated the procedure well.  Findings:               Aortogram:  This demonstrated normal renal arteries and normal aorta and iliac segments without significant stenosis after previous iliac stenting.  The stents were all widely patent.             Right lower Extremity:  This demonstrated normal common femoral artery, profunda femoris artery, and no significant stenosis at the origin of the superficial femoral artery after previous endarterectomy.  The SFA had near occlusive stenosis in the proximal to mid segment, the mid segment, and then a moderate stenosis at Hunter's canal in the 60 to 70% range.  The mid and distal popliteal artery were fairly normal.  There was then normal tibial trifurcation with normal tibials distally without focal  stenosis.   Disposition: Patient was taken to the recovery room in stable condition having tolerated the procedure well.  Complications: None  William Ball 01/12/2023 2:30 PM   This note was created with Dragon Medical transcription system. Any errors in dictation are purely unintentional.

## 2023-01-12 NOTE — Interval H&P Note (Signed)
History and Physical Interval Note:  01/12/2023 1:00 PM  William Ball  has presented today for surgery, with the diagnosis of RLE Angio   ASO w rest pain.  The various methods of treatment have been discussed with the patient and family. After consideration of risks, benefits and other options for treatment, the patient has consented to  Procedure(s): Lower Extremity Angiography (Right) as a surgical intervention.  The patient's history has been reviewed, patient examined, no change in status, stable for surgery.  I have reviewed the patient's chart and labs.  Questions were answered to the patient's satisfaction.     Festus Barren

## 2023-01-12 NOTE — Progress Notes (Signed)
Dr. Lucky Cowboy at bedside, speaking with pt. And his wife re: procedural results. Both verbalized understanding of conversation.

## 2023-01-13 ENCOUNTER — Encounter: Payer: Self-pay | Admitting: Vascular Surgery

## 2023-01-13 MED ORDER — IODIXANOL 320 MG/ML IV SOLN
INTRAVENOUS | Status: AC | PRN
  Administered 2023-01-12: 45 mL

## 2023-01-15 ENCOUNTER — Telehealth (INDEPENDENT_AMBULATORY_CARE_PROVIDER_SITE_OTHER): Payer: Self-pay

## 2023-01-15 NOTE — Telephone Encounter (Signed)
I suspect he's having some reperfusion swelling from his angiogram.  This occurs when we restore blood flow to an area that didn't have great blood flow.  It swells and can be painful with redness and discomfort.   We can being him in for a quick arterial duplex to make sure everything is patent.  He should elevate his leg and wear compression if he has some available.

## 2023-01-15 NOTE — Telephone Encounter (Signed)
Patient called stating Monday after his Right leg angio he was fine and slept well, but the next day. He had numbness in his foot with redness on the top, no heat or swelling. Now his foot is sore, still red, numb and he has started back using his walker to go the bathroom. He stated all he been doing is laying in bed to stay off of it.   Please advise.

## 2023-02-18 ENCOUNTER — Ambulatory Visit (INDEPENDENT_AMBULATORY_CARE_PROVIDER_SITE_OTHER): Payer: Medicare HMO | Admitting: Nurse Practitioner

## 2023-02-18 ENCOUNTER — Encounter (INDEPENDENT_AMBULATORY_CARE_PROVIDER_SITE_OTHER): Payer: Medicare HMO

## 2023-09-22 ENCOUNTER — Encounter (INDEPENDENT_AMBULATORY_CARE_PROVIDER_SITE_OTHER): Payer: Self-pay

## 2023-11-16 NOTE — Progress Notes (Signed)
 New Patient Visit    Chief Complaint: Coronary artery disease Chief Complaint  Patient presents with   [facility] Follow Up   Date of Service: 11/16/2023 Date of Birth: 12-15-1945 PCP: Steva Clotilda Conn, NP  History of Present Illness: [name] is a 78 y.o.male patient who presented for coronary artery disease.  Past medical history significant for moderate nonobstructive CAD-CTA coronaries 08/2019 which showed mid LAD moderate stenosis, RCA/left circumflex mild stenosis with negative FFR.  Other history include hypertension, peripheral arterial disease with prior SFA stent, diabetes, tobacco abuse-continues to smoke, COPD, GI bleed issues.  Today patient details that he is overall stable from cardiac standpoint.  No complaints of chest pain/pressure, increased shortness of breath.  Ambulation limited due to pain in lower extremities for which she is going to follow-up with vascular.  No palpitation, dizziness or syncope.  EKG today showed sinus rhythm with right bundle branch block, left anterior fascicular block  Past Medical and Surgical History  Past Medical History Past Medical History:  Diagnosis Date   Acute lower limb ischemia 09/03/2022   Last Assessment & Plan:   Formatting of this note might be different from the original.  Right lower extremity  Continue heparin  gtt per pharmacy  EDP has consulted vascular  Symptomatic support: Morphine  4 mg IV every 4 hours as needed for moderate pain, 18 hours of coverage ordered; fentanyl  25 mcg IV every 3 hours as needed for severe pain, 15 hours ordered; AM team to reevaluate patient a   AKI (acute kidney injury) () 09/29/2022   Last Assessment & Plan:   Formatting of this note might be different from the original.  I suspect this is multifactorial in setting of upper GI bleed concerns  Status post sodium chloride  500 mL bolus, I have ordered sodium chloride  infusion at 100 mL/h, 5 hours ordered  On CKD 3A  Serum  creatinine range has been 1.35-1.6, over the last 3 weeks     Alcohol use 09/03/2022   Last Assessment & Plan:   Formatting of this note might be different from the original.  Patient drinks 50 ounces of beer per day, he denies history of tremors and/or seizure however does not remember the last time he has been without alcohol for more than 2 days  Ativan  2 mg IV every 4 hours as needed for anxiety ordered with instructions to administer as appropriate and then let provider know   Blood clot in vein    Cellulitis of drainage site, post-operative 09/30/2022   Cellulitis of left groin 09/29/2022   Last Assessment & Plan:   Formatting of this note might be different from the original.  Status post ceftriaxone  2 g IV one-time dose and vancomycin  per EDP  Continue with ceftriaxone  1 g IV daily  Vascular has been consulted     Diabetes mellitus without complication (CMS/HHS-HCC)    Diarrhea 04/28/2023   Gastroesophageal reflux 01/22/2015   Last Assessment & Plan:  Formatting of this note might be different from the original. Discuss with relief of symptoms with Prilosec and reflux is his diagnosis. 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 The current medical regimen is effective;  continue present plan and medications.   Hyponatremia 09/05/2022   Right leg pain 09/03/2022   Slow transit constipation 02/02/2020    Past Surgical History He has a past surgical history that includes Appendectomy (1984); Cataract extraction; LEFT ANKLE PAIN (Left); and LEFT FOOT SURGERY (Left).   Medications and Allergies  Current Medications Current Outpatient Medications  Medication Sig  Dispense Refill   ascorbic acid, vitamin C, (VITAMIN C) 500 MG tablet Take 1 tablet (500 mg total) by mouth 2 (two) times daily Take at the same time as the Ferrous Sulfate to aid in absorption. 180 tablet 3   aspirin  81 mg Cap Take 81 mg by mouth every other day 360 capsule 11   atorvastatin  (LIPITOR) 40 MG tablet TAKE 1 TABLET BY MOUTH EVERY DAY 90 tablet 3   carvediloL  (COREG ) 6.25 MG tablet TAKE 1 TABLET BY MOUTH TWICE DAILY WITH MEALS 180 tablet 3   chlorthalidone  50 MG tablet TAKE 1 TABLET BY MOUTH EVERY DAY 90 tablet 3   cholecalciferol (VITAMIN D3) 5,000 unit capsule Take 1 capsule (5,000 Units total) by mouth once daily 360 capsule 11   cilostazoL (PLETAL) 100 MG tablet Take 1 tablet (100 mg total) by mouth 2 (two) times daily 180 tablet 3   clopidogreL  (PLAVIX ) 75 mg tablet Take 75 mg by mouth once daily     dapagliflozin propanediol (FARXIGA) 5 mg tablet Take 1 tablet (5 mg total) by mouth once daily 30 tablet 3   DOCOSAHEXAENOIC ACID ORAL Take 1 g by mouth     ferrous sulfate 325 (65 FE) MG tablet Take 1 tablet (325 mg total) by mouth 2 (two) times daily with meals Take at the same time at the Ascorbic Acid (Vitamin C) to aid in absorption 180 tablet 3   folic acid  (FOLVITE ) 1 MG tablet Take 1 mg by mouth once daily     gabapentin  (NEURONTIN ) 400 MG capsule Take 2 capsules (800 mg total) by mouth 3 (three) times daily 540 capsule 3   glipiZIDE  (GLUCOTROL  XL) 10 MG XL tablet Take 1 tablet (10 mg total) by mouth once daily 90 tablet 3   loperamide (IMODIUM) 2 mg capsule Take 2 capsules (4 mg total) by mouth 2 (two) times daily 120 capsule 2   metOLazone (ZAROXOLYN) 2.5 MG tablet Take 1 tablet (2.5 mg total) by mouth 3 (three) times a week 36 tablet 3   omega 3-dha-epa-fish oil (FISH OIL) 1,000 mg (120 mg-180 mg) Cap Take 1,000 mg by mouth once daily 260 capsule 11   pantoprazole  (PROTONIX ) 40 MG DR tablet Take 1 tablet (40 mg total) by mouth once daily 90 tablet 3    pen needle, diabetic 32 gauge x 1/4 needle Use once daily with Lantus  pen 100 each 1   spironolactone  (ALDACTONE ) 25 MG tablet Take 0.5 tablets (12.5 mg total) by mouth once daily 90 tablet 1   SSD 1 % cream Apply topically 2 (two) times daily     terazosin  (HYTRIN ) 2 MG capsule Take 2 capsules (4 mg total) by mouth at bedtime 180 capsule 2   thiamine  (VITAMIN B-1) 100 MG tablet Take 100 mg by mouth once daily     TRESIBA  FLEXTOUCH U-100 pen injector (concentration 100 units/mL) INJECT 30 UNITS SUBCUTANEOUSLY ONCE DAILY 15 mL 3   valsartan  (DIOVAN ) 320 MG tablet Take 1 tablet by mouth once daily 90 tablet 0   albuterol  90 mcg/actuation inhaler Inhale 2 inhalations into the lungs every 4 (four) hours as  needed for Wheezing or Shortness of Breath 1 each 5   hydrALAZINE  (APRESOLINE ) 25 MG tablet Take 1 tablet (25 mg total) by mouth 3 (three) times daily 90 tablet 11   No current facility-administered medications for this visit.    Allergies Amlodipine   Social and Family History  Social History  reports that he has been smoking cigarettes. He has been exposed to tobacco smoke. He has never used smokeless tobacco. He reports current alcohol use. He reports that he does not use drugs.  Family History family history includes Breast cancer in his mother; Cancer in his mother; Heart disease in his brother, father, and mother; Myocardial Infarction (Heart attack) in his father and mother; No Known Problems in his sister; Sudden cardiac death in his father and mother.   Review of Systems   Review of Systems: The patient denies chest pain, shortness of breath, orthopnea, paroxysmal nocturnal dyspnea, pedal edema, palpitations, heart racing, presyncope, syncope.    Physical Examination   Vitals:BP (!) 140/78   Pulse 89   Resp 16   Ht 175.3 cm (5' 9)   Wt 88.5 kg (195 lb)   SpO2 98%   BMI 28.80 kg/m  Ht:175.3 cm (5' 9) Wt:88.5 kg (195 lb) ADJ:Anib surface area is 2.08 meters  squared. Body mass index is 28.8 kg/m.  HEENT: Pupils equally reactive to light and accomodation  Neck: Supple, no significant JVD Lungs: clear to auscultation bilaterally; no wheezes, rales, rhonchi Heart: Regular rate and rhythm. No murmur Extremities: no pedal edema  Assessment and Plan   78 y.o. male with  Moderate nonobstructive CAD, CTA coronaries 08/2019 Hypertension Hyperlipidemia PAD with prior stent Tobacco abuse-continues to smoke, COPD RBBB/LAFB GI bleed history  Stable from cardiac standpoint without any anginal symptoms.   Blood pressure elevated here and at home. Will increase hydralazine  to 25 mg TID.  Continue valsartan , Coreg , spironolactone . Continue antiplatelet therapy Continue statin. Echo before next visit to follow-up on LVEF/valve He has follow-up with vascular for PAD issues  Orders Placed This Encounter  Procedures   ECG 12-lead   Echo complete    Return in about 6 months (around 05/18/2024).  KRISHNA CHAITANYA ALLURI, MD  This dictation was prepared with dragon dictation. Any transcription errors that result from this process are unintentional.

## 2023-12-09 ENCOUNTER — Other Ambulatory Visit (INDEPENDENT_AMBULATORY_CARE_PROVIDER_SITE_OTHER): Payer: Self-pay | Admitting: Vascular Surgery

## 2023-12-09 DIAGNOSIS — I739 Peripheral vascular disease, unspecified: Secondary | ICD-10-CM

## 2023-12-10 ENCOUNTER — Encounter (INDEPENDENT_AMBULATORY_CARE_PROVIDER_SITE_OTHER)

## 2023-12-10 ENCOUNTER — Ambulatory Visit (INDEPENDENT_AMBULATORY_CARE_PROVIDER_SITE_OTHER): Admitting: Nurse Practitioner

## 2024-02-01 ENCOUNTER — Ambulatory Visit (INDEPENDENT_AMBULATORY_CARE_PROVIDER_SITE_OTHER)

## 2024-02-01 ENCOUNTER — Ambulatory Visit (INDEPENDENT_AMBULATORY_CARE_PROVIDER_SITE_OTHER): Admitting: Vascular Surgery

## 2024-02-01 ENCOUNTER — Encounter (INDEPENDENT_AMBULATORY_CARE_PROVIDER_SITE_OTHER): Payer: Self-pay | Admitting: Vascular Surgery

## 2024-02-01 VITALS — BP 197/63 | HR 60 | Resp 18 | Wt 205.6 lb

## 2024-02-01 DIAGNOSIS — I739 Peripheral vascular disease, unspecified: Secondary | ICD-10-CM

## 2024-02-01 DIAGNOSIS — I70221 Atherosclerosis of native arteries of extremities with rest pain, right leg: Secondary | ICD-10-CM

## 2024-02-01 DIAGNOSIS — E1151 Type 2 diabetes mellitus with diabetic peripheral angiopathy without gangrene: Secondary | ICD-10-CM | POA: Diagnosis not present

## 2024-02-01 DIAGNOSIS — I152 Hypertension secondary to endocrine disorders: Secondary | ICD-10-CM

## 2024-02-01 DIAGNOSIS — Z9889 Other specified postprocedural states: Secondary | ICD-10-CM

## 2024-02-01 DIAGNOSIS — E785 Hyperlipidemia, unspecified: Secondary | ICD-10-CM | POA: Diagnosis not present

## 2024-02-01 DIAGNOSIS — E1159 Type 2 diabetes mellitus with other circulatory complications: Secondary | ICD-10-CM | POA: Diagnosis not present

## 2024-02-01 NOTE — H&P (View-Only) (Signed)
 Subjective:    Patient ID: William Ball, male    DOB: 1946/01/24, 78 y.o.   MRN: [id number] Chief Complaint  Patient presents with   Follow-up    William Ball follow up    William Ball is a 78 yo male who presents to clinic today for follow up from bilateral femoral endarterectomies with stent placement with stability of the left common and right common femoral arteries as well as left SFA and right profunda femoris arteries.  At the time the patient had bilateral iliac stents placed as well.  Patient was last seen by vascular surgery on 01/12/2023 for right lower extremity angiogram with stent placement extension to the right SFA again.  Patient presents to clinic today with right lower extremity pain at rest with swelling to his right lower extremity and to his bilateral thighs today.  Patient also notes that if he sits for a few minutes he gets sharp shooting pains down his right leg to his foot.  Patient endorses he has not been using any type of compression to his lower extremity when he does not elevate his legs properly.  He is also continues to smoke.  Patient underwent bilateral lower extremity arterial duplex ultrasounds with ABIs.  Right ABI today is 0.78    previous right ABI was 0.58 patient noted to have biphasic waveforms in his right lower extremity. Left ABI today is 0.95      previous left ABI was 1.00 patient noted to have triphasic waveforms in his left lower extremity.    Review of Systems  Constitutional: Negative.   Cardiovascular:  Positive for leg swelling.       Patient endorses bilateral lower extremity swelling from feet to thighs since postoperative femoral endarterectomies.  Musculoskeletal:  Positive for back pain and myalgias.  Skin:  Positive for color change.       Bilateral lower extremities pink in color.  Neurological:  Positive for numbness.       Patient notes bilateral thigh numbness postsurgery and procedures.  All other systems reviewed and are negative.       Objective:   Physical Exam Constitutional:      Appearance: Normal appearance. He is obese.  HENT:     Head: Normocephalic.  Eyes:     Pupils: Pupils are equal, round, and reactive to light.  Cardiovascular:     Rate and Rhythm: Normal rate and regular rhythm.     Pulses: Normal pulses.     Heart sounds: Normal heart sounds.  Pulmonary:     Effort: Pulmonary effort is normal.     Breath sounds: Normal breath sounds.  Abdominal:     General: Bowel sounds are normal.     Palpations: Abdomen is soft.  Musculoskeletal:        General: Swelling and tenderness present.     Right lower leg: Edema present.     Left lower leg: Edema present.  Skin:    General: Skin is warm and dry.     Capillary Refill: Capillary refill takes less than 2 seconds. Capillary refill delayed in his right lower extremity Neurological:     General: No focal deficit present.     Mental Status: He is alert and oriented to person, place, and time. Mental status is at baseline.  Psychiatric:        Mood and Affect: Mood normal.        Behavior: Behavior normal.        Thought Content: Thought  content normal.        Judgment: Judgment normal.     BP (!) 197/63   Pulse 60   Resp 18   Wt 205 lb 9.6 oz (93.3 kg)   BMI 30.36 kg/m   Past Medical History:  Diagnosis Date   Diabetes mellitus without complication (HCC)    borderline   Hepatomegaly    Hyperlipidemia    Hypertension    Substance abuse (HCC)     Social History   Socioeconomic History   Marital status: Married    Spouse name: Not on file   Number of children: Not on file   Years of education: Not on file   Highest education level: 9th grade  Occupational History   Occupation: working full time   Tobacco Use   Smoking status: Every Day    Current packs/day: 1.00    Average packs/day: 1 pack/day for 55.0 years (55.0 ttl pk-yrs)    Types: Cigarettes   Smokeless tobacco: Never  Vaping Use   Vaping status: Never Used  Substance  and Sexual Activity   Alcohol use: Yes    Alcohol/week: 28.0 standard drinks of alcohol    Types: 28 Cans of beer per week    Comment: 4-6 daily beer   Drug use: No   Sexual activity: Not on file  Other Topics Concern   Not on file  Social History Narrative   Retired   Chief Executive Officer Drivers of Corporate investment banker Strain: Low Risk  (10/27/2023)   Received from [facility]   Overall Financial Resource Strain (CARDIA)    Difficulty of Paying Living Expenses: Not very hard  Food Insecurity: No Food Insecurity (10/27/2023)   Received from [facility]   Hunger Vital Sign    Within the past 12 months, you worried that your food would run out before you got the money to buy more.: Never true    Within the past 12 months, the food you bought just didn't last and you didn't have money to get more.: Never true  Transportation Needs: No Transportation Needs (10/27/2023)   Received from [facility] - Transportation    In the past 12 months, has lack of transportation kept you from medical appointments or from getting medications?: No    Lack of Transportation (Non-Medical): No  Physical Activity: Inactive (10/27/2023)   Received from [facility]   Exercise Vital Sign    On average, how many days per week do you engage in moderate to strenuous exercise (like a brisk walk)?: 0 days    On average, how many minutes do you engage in exercise at this level?: 0 min  Stress: No Stress Concern Present (10/27/2023)   Received from [facility] - Occupational Stress Questionnaire    Feeling of Stress : Not at all  Social Connections: Moderately Isolated (10/27/2023)   Received from [facility]   Social Connection and Isolation Panel    In a typical week, how many times do you talk on the phone with family, friends, or neighbors?: More than three  times a week    How often do you get together with friends or relatives?: Once a week    How often do you attend church or religious services?: Never    Do you belong to any clubs or organizations such as church groups, unions, fraternal or athletic  groups, or school groups?: No    How often do you attend meetings of the clubs or organizations you belong to?: Never    Are you married, widowed, divorced, separated, never married, or living with a partner?: Married  Intimate Partner Violence: Not At Risk (10/12/2023)   Received from [facility]   Humiliation, Afraid, Rape, and Kick questionnaire    Within the last year, have you been afraid of your partner or ex-partner?: No    Within the last year, have you been humiliated or emotionally abused in other ways by your partner or ex-partner?: No    Within the last year, have you been kicked, hit, slapped, or otherwise physically hurt by your partner or ex-partner?: No    Within the last year, have you been raped or forced to have any kind of sexual activity by your partner or ex-partner?: No    Past Surgical History:  Procedure Laterality Date   ANGIOPLASTY     APPENDECTOMY     BONE MARROW TRANSPLANT Left    ankle.  patient unsure of this but thinks this is what happened   CARDIAC SURGERY     CATARACT EXTRACTION W/PHACO Left 11/13/2015   Procedure: CATARACT EXTRACTION PHACO AND INTRAOCULAR LENS PLACEMENT (IOC);  Surgeon: Elsie Carmine, MD;  Location: [facility] ORS;  Service: Ophthalmology;  Laterality: Left;  US  1.06 AP% 23.9 CDE 15.81 Fluid pack lot # [id number]   CATARACT EXTRACTION W/PHACO Right 11/27/2015   Procedure: CATARACT EXTRACTION PHACO AND INTRAOCULAR LENS PLACEMENT (IOC);  Surgeon: Elsie Carmine, MD;  Location: [facility] OR[facility];  Service: Ophthalmology;  Laterality: Right;  US  1.06 AP% 23.8 CDE 15.80 Fluid pack lot # [id number]   COLONOSCOPY  2012   polyps, diverticulosis , 77yr repeat   COLONOSCOPY WITH PROPOFOL  N/A 07/17/2016    Procedure: COLONOSCOPY WITH PROPOFOL ;  Surgeon: Ruel Kung, MD;  Location: [facility] ENDOSCOPY;  Service: Endoscopy;  Laterality: N/A;   ENDARTERECTOMY FEMORAL Bilateral 09/10/2022   Procedure: ENDARTERECTOMY FEMORAL;  Surgeon: Marea Selinda RAMAN, MD;  Location: [facility] ORS;  Service: Vascular;  Laterality: Bilateral;   ESOPHAGOGASTRODUODENOSCOPY (EGD) WITH PROPOFOL  N/A 09/30/2022   Procedure: ESOPHAGOGASTRODUODENOSCOPY (EGD) WITH PROPOFOL ;  Surgeon: Jinny Carmine, MD;  Location: [facility] ENDOSCOPY;  Service: Endoscopy;  Laterality: N/A;   FRACTURE SURGERY Left 1982   foot, leg.unsure if metal in these parts.possibly replaced ankle   GIVENS CAPSULE STUDY N/A 10/03/2022   Procedure: GIVENS CAPSULE STUDY;  Surgeon: Kung Ruel, MD;  Location: [facility] ENDOSCOPY;  Service: Gastroenterology;  Laterality: N/A;   INSERTION OF ILIAC STENT Bilateral 09/10/2022   Procedure: INSERTION OF ILIAC STENT;  Surgeon: Marea Selinda RAMAN, MD;  Location: [facility] ORS;  Service: Vascular;  Laterality: Bilateral;   LOWER EXTREMITY ANGIOGRAPHY Right 09/04/2022   Procedure: Lower Extremity Angiography;  Surgeon: Marea Selinda RAMAN, MD;  Location: [facility];  Service: Cardiovascular;  Laterality: Right;   LOWER EXTREMITY ANGIOGRAPHY Right 01/12/2023   Procedure: Lower Extremity Angiography;  Surgeon: Marea Selinda RAMAN, MD;  Location: [facility];  Service: Cardiovascular;  Laterality: Right;   PERIPHERAL VASCULAR CATHETERIZATION Left 12/27/2015   Procedure: Lower Extremity Angiography;  Surgeon: Selinda RAMAN Marea, MD;  Location: [facility];  Service: Cardiovascular;  Laterality: Left;    Family History  Problem Relation Age of Onset   Cancer Mother    Heart disease Mother    Hyperlipidemia Mother    Heart disease Father    Hyperlipidemia Father     Allergies  Allergen  Reactions   Amlodipine  Itching       Latest Ref Rng & Units 01/12/2023    1:42 PM 10/16/2022    4:45 AM 10/14/2022    7:47 AM  CBC  WBC 4.0 - 10.5 K/uL 7.8  6.1  6.1    Hemoglobin 13.0 - 17.0 g/dL 89.1  7.7  7.8   Hematocrit 39.0 - 52.0 % 34.0  24.0  24.2   Platelets 150 - 400 K/uL 362  263  249       CMP     Component Value Date/Time   NA 138 10/07/2022 0548   NA 134 06/18/2020 1556   K 4.5 10/07/2022 0548   CL 105 10/07/2022 0548   CO2 25 10/07/2022 0548   GLUCOSE 134 (H) 10/07/2022 0548   BUN 28 (H) 01/12/2023 1303   BUN 21 06/18/2020 1556   CREATININE 1.19 01/12/2023 1303   CALCIUM  8.5 (L) 10/07/2022 0548   PROT 7.1 09/29/2022 1827   PROT 7.5 09/26/2019 1603   ALBUMIN 3.8 09/29/2022 1827   ALBUMIN 4.6 09/26/2019 1603   AST 46 (H) 09/29/2022 1827   AST 36 02/18/2018 1528   ALT 97 (H) 09/29/2022 1827   ALT 44 02/18/2018 1528   ALKPHOS 91 09/29/2022 1827   BILITOT 0.7 09/29/2022 1827   BILITOT <0.2 09/26/2019 1603   GFRNONAA >60 01/12/2023 1303     No results found.     Assessment & Plan:   1. Peripheral arterial disease with history of revascularization (Primary) Patient is now postoperative from 09/10/2022 from bilateral femoral endarterectomies.  Patient had a left common femoral artery and SFA occlusions as well as right common femoral artery with profunda femoris occlusions that were revascularized.  He also had bilateral iliac stents placed at same time.  Patient had difficulty healing both of those incisions left greater than right.  They are well-healed today without any complications to note.  No hematoma seroma or infections to note today on follow-up.  Patient also underwent on 01/12/2023 right lower extremity angiogram which she had an extension of previous stent placement to the right SFA completed.  Patient was last seen by us  on 01/12/2023.  He returns today for follow-up and bilateral lower extremity evaluation.  2. Atherosclerosis of native artery of right lower extremity with rest pain (HCC) Recommend:  The patient has experienced increased claudication symptoms and is now describing lifestyle limiting claudication and  appears to be having continuous rest pain symptroms. Patient also noted to have bilateral lower extremity swelling from his thighs to his feet with numbness to his inner thighs bilaterally.    Given the severity of the patient's severe right lower extremity symptoms the patient should undergo angiography with the hope for intervention.  Risk and benefits were reviewed the patient.  Indications for the procedure were reviewed.  All questions were answered, the patient agrees to proceed with right lower extremity angiography and possible intervention.   The patient should continue walking and begin a more formal exercise program.  The patient should continue antiplatelet therapy and aggressive treatment of the lipid abnormalities  The patient will follow up with me after the angiogram.   3. Hyperlipidemia, unspecified hyperlipidemia type Continue statin as ordered and reviewed, no changes at this time  4. Hypertension associated with diabetes (HCC) Continue antihypertensive medications as already ordered, these medications have been reviewed and there are no changes at this time.  5. Type 2 diabetes mellitus with diabetic peripheral angiopathy without gangrene, unspecified whether  long term insulin  use (HCC) Continue hypoglycemic medications as already ordered, these medications have been reviewed and there are no changes at this time.  Hgb A1C to be monitored as already arranged by primary service   Current Outpatient Medications on File Prior to Visit  Medication Sig Dispense Refill   aspirin  EC 81 MG tablet Take 81 mg by mouth every other day.     atorvastatin  (LIPITOR) 40 MG tablet Take 40 mg by mouth daily.     carvedilol  (COREG ) 6.25 MG tablet Take 3.125 mg by mouth 2 (two) times daily.     chlorthalidone  (HYGROTON ) 50 MG tablet Take 1 tablet (50 mg total) by mouth daily. MUST BE SEEN IN CLINIC FOR FURTHER REFILLS ON MEDICATION 30 tablet 0   clopidogrel  (PLAVIX ) 75 MG tablet Take 1  tablet (75 mg total) by mouth daily. 90 tablet 3   clopidogrel  (PLAVIX ) 75 MG tablet Take 1 tablet (75 mg total) by mouth daily. 30 tablet 11   empagliflozin  (JARDIANCE ) 10 MG TABS tablet Take 10 mg by mouth daily.     folic acid  (FOLVITE ) 1 MG tablet Take 1 tablet (1 mg total) by mouth daily. (Patient taking differently: Take 0.5 mg by mouth daily.)     gabapentin  (NEURONTIN ) 400 MG capsule Take 800 mg by mouth at bedtime.     glipiZIDE  (GLUCOTROL  XL) 10 MG 24 hr tablet Take 20 mg by mouth daily.     insulin  degludec (TRESIBA ) 100 UNIT/ML FlexTouch Pen Inject 30 Units into the skin at bedtime.     Omega-3 Fatty Acids (FISH OIL) 1000 MG CAPS Take 1,000 mg by mouth daily.     spironolactone  (ALDACTONE ) 25 MG tablet Take 25 mg by mouth daily.     terazosin  (HYTRIN ) 2 MG capsule Take 2 mg by mouth at bedtime.     thiamine  (VITAMIN B-1) 100 MG tablet Take 1 tablet (100 mg total) by mouth daily.     valsartan  (DIOVAN ) 320 MG tablet Take 160 mg by mouth daily.     albuterol  (VENTOLIN  HFA) 108 (90 Base) MCG/ACT inhaler Inhale 1-2 puffs into the lungs every 6 (six) hours as needed for wheezing or shortness of breath. (Patient not taking: Reported on 02/01/2024)     gabapentin  (NEURONTIN ) 100 MG capsule Take 100 mg by mouth 3 (three) times daily. (Patient not taking: Reported on 02/01/2024)     Multiple Vitamin (MULTIVITAMIN WITH MINERALS) TABS tablet Take 1 tablet by mouth daily. (Patient not taking: Reported on 02/01/2024)     nicotine  (NICODERM CQ  - DOSED IN MG/24 HOURS) 21 mg/24hr patch Place 1 patch (21 mg total) onto the skin daily as needed (nicotine  craving). (Patient not taking: Reported on 02/01/2024) 28 patch 0   oxyCODONE -acetaminophen  (PERCOCET/ROXICET) 5-325 MG tablet Take 1 tablet by mouth every 4 (four) hours as needed for severe pain. (Patient not taking: Reported on 02/01/2024)     polyethylene glycol (MIRALAX  / GLYCOLAX ) 17 g packet Take 17 g by mouth 2 (two) times daily. (Patient not taking:  Reported on 02/01/2024) 14 each 0   senna-docusate (SENOKOT-S) 8.6-50 MG tablet Take 2 tablets by mouth 2 (two) times daily. (Patient not taking: Reported on 02/01/2024)     silver  sulfADIAZINE  (SILVADENE ) 1 % cream Apply topically 2 (two) times daily. (Patient not taking: Reported on 02/01/2024) 50 g 0   Current Facility-Administered Medications on File Prior to Visit  Medication Dose Route Frequency Provider Last Rate Last Admin   iodixanol  (VISIPAQUE ) 320 MG/ML injection  PRN Dew, Jason S, MD   45 mL at 01/12/23 1416    There are no Patient Instructions on file for this visit. No follow-ups on file.   Gwendlyn JONELLE Shank, NP

## 2024-02-01 NOTE — Progress Notes (Signed)
 Subjective:    Patient ID: William Ball, male    DOB: 1946/01/24, 78 y.o.   MRN: [id number] Chief Complaint  Patient presents with   Follow-up    William Ball follow up    William Ball is a 78 yo male who presents to clinic today for follow up from bilateral femoral endarterectomies with stent placement with stability of the left common and right common femoral arteries as well as left SFA and right profunda femoris arteries.  At the time the patient had bilateral iliac stents placed as well.  Patient was last seen by vascular surgery on 01/12/2023 for right lower extremity angiogram with stent placement extension to the right SFA again.  Patient presents to clinic today with right lower extremity pain at rest with swelling to his right lower extremity and to his bilateral thighs today.  Patient also notes that if he sits for a few minutes he gets sharp shooting pains down his right leg to his foot.  Patient endorses he has not been using any type of compression to his lower extremity when he does not elevate his legs properly.  He is also continues to smoke.  Patient underwent bilateral lower extremity arterial duplex ultrasounds with ABIs.  Right ABI today is 0.78    previous right ABI was 0.58 patient noted to have biphasic waveforms in his right lower extremity. Left ABI today is 0.95      previous left ABI was 1.00 patient noted to have triphasic waveforms in his left lower extremity.    Review of Systems  Constitutional: Negative.   Cardiovascular:  Positive for leg swelling.       Patient endorses bilateral lower extremity swelling from feet to thighs since postoperative femoral endarterectomies.  Musculoskeletal:  Positive for back pain and myalgias.  Skin:  Positive for color change.       Bilateral lower extremities pink in color.  Neurological:  Positive for numbness.       Patient notes bilateral thigh numbness postsurgery and procedures.  All other systems reviewed and are negative.       Objective:   Physical Exam Constitutional:      Appearance: Normal appearance. He is obese.  HENT:     Head: Normocephalic.  Eyes:     Pupils: Pupils are equal, round, and reactive to light.  Cardiovascular:     Rate and Rhythm: Normal rate and regular rhythm.     Pulses: Normal pulses.     Heart sounds: Normal heart sounds.  Pulmonary:     Effort: Pulmonary effort is normal.     Breath sounds: Normal breath sounds.  Abdominal:     General: Bowel sounds are normal.     Palpations: Abdomen is soft.  Musculoskeletal:        General: Swelling and tenderness present.     Right lower leg: Edema present.     Left lower leg: Edema present.  Skin:    General: Skin is warm and dry.     Capillary Refill: Capillary refill takes less than 2 seconds. Capillary refill delayed in his right lower extremity Neurological:     General: No focal deficit present.     Mental Status: He is alert and oriented to person, place, and time. Mental status is at baseline.  Psychiatric:        Mood and Affect: Mood normal.        Behavior: Behavior normal.        Thought Content: Thought  content normal.        Judgment: Judgment normal.     BP (!) 197/63   Pulse 60   Resp 18   Wt 205 lb 9.6 oz (93.3 kg)   BMI 30.36 kg/m   Past Medical History:  Diagnosis Date   Diabetes mellitus without complication (HCC)    borderline   Hepatomegaly    Hyperlipidemia    Hypertension    Substance abuse (HCC)     Social History   Socioeconomic History   Marital status: Married    Spouse name: Not on file   Number of children: Not on file   Years of education: Not on file   Highest education level: 9th grade  Occupational History   Occupation: working full time   Tobacco Use   Smoking status: Every Day    Current packs/day: 1.00    Average packs/day: 1 pack/day for 55.0 years (55.0 ttl pk-yrs)    Types: Cigarettes   Smokeless tobacco: Never  Vaping Use   Vaping status: Never Used  Substance  and Sexual Activity   Alcohol use: Yes    Alcohol/week: 28.0 standard drinks of alcohol    Types: 28 Cans of beer per week    Comment: 4-6 daily beer   Drug use: No   Sexual activity: Not on file  Other Topics Concern   Not on file  Social History Narrative   Retired   Chief Executive Officer Drivers of Corporate investment banker Strain: Low Risk  (10/27/2023)   Received from [facility]   Overall Financial Resource Strain (CARDIA)    Difficulty of Paying Living Expenses: Not very hard  Food Insecurity: No Food Insecurity (10/27/2023)   Received from [facility]   Hunger Vital Sign    Within the past 12 months, you worried that your food would run out before you got the money to buy more.: Never true    Within the past 12 months, the food you bought just didn't last and you didn't have money to get more.: Never true  Transportation Needs: No Transportation Needs (10/27/2023)   Received from [facility] - Transportation    In the past 12 months, has lack of transportation kept you from medical appointments or from getting medications?: No    Lack of Transportation (Non-Medical): No  Physical Activity: Inactive (10/27/2023)   Received from [facility]   Exercise Vital Sign    On average, how many days per week do you engage in moderate to strenuous exercise (like a brisk walk)?: 0 days    On average, how many minutes do you engage in exercise at this level?: 0 min  Stress: No Stress Concern Present (10/27/2023)   Received from [facility] - Occupational Stress Questionnaire    Feeling of Stress : Not at all  Social Connections: Moderately Isolated (10/27/2023)   Received from [facility]   Social Connection and Isolation Panel    In a typical week, how many times do you talk on the phone with family, friends, or neighbors?: More than three  times a week    How often do you get together with friends or relatives?: Once a week    How often do you attend church or religious services?: Never    Do you belong to any clubs or organizations such as church groups, unions, fraternal or athletic  groups, or school groups?: No    How often do you attend meetings of the clubs or organizations you belong to?: Never    Are you married, widowed, divorced, separated, never married, or living with a partner?: Married  Intimate Partner Violence: Not At Risk (10/12/2023)   Received from [facility]   Humiliation, Afraid, Rape, and Kick questionnaire    Within the last year, have you been afraid of your partner or ex-partner?: No    Within the last year, have you been humiliated or emotionally abused in other ways by your partner or ex-partner?: No    Within the last year, have you been kicked, hit, slapped, or otherwise physically hurt by your partner or ex-partner?: No    Within the last year, have you been raped or forced to have any kind of sexual activity by your partner or ex-partner?: No    Past Surgical History:  Procedure Laterality Date   ANGIOPLASTY     APPENDECTOMY     BONE MARROW TRANSPLANT Left    ankle.  patient unsure of this but thinks this is what happened   CARDIAC SURGERY     CATARACT EXTRACTION W/PHACO Left 11/13/2015   Procedure: CATARACT EXTRACTION PHACO AND INTRAOCULAR LENS PLACEMENT (IOC);  Surgeon: Elsie Carmine, MD;  Location: [facility] ORS;  Service: Ophthalmology;  Laterality: Left;  US  1.06 AP% 23.9 CDE 15.81 Fluid pack lot # [id number]   CATARACT EXTRACTION W/PHACO Right 11/27/2015   Procedure: CATARACT EXTRACTION PHACO AND INTRAOCULAR LENS PLACEMENT (IOC);  Surgeon: Elsie Carmine, MD;  Location: [facility] ORS;  Service: Ophthalmology;  Laterality: Right;  US  1.06 AP% 23.8 CDE 15.80 Fluid pack lot # [id number]   COLONOSCOPY  2012   polyps, diverticulosis , 77yr repeat   COLONOSCOPY WITH PROPOFOL  N/A 07/17/2016    Procedure: COLONOSCOPY WITH PROPOFOL ;  Surgeon: Ruel Kung, MD;  Location: [facility] ENDOSCOPY;  Service: Endoscopy;  Laterality: N/A;   ENDARTERECTOMY FEMORAL Bilateral 09/10/2022   Procedure: ENDARTERECTOMY FEMORAL;  Surgeon: Marea Selinda RAMAN, MD;  Location: [facility] ORS;  Service: Vascular;  Laterality: Bilateral;   ESOPHAGOGASTRODUODENOSCOPY (EGD) WITH PROPOFOL  N/A 09/30/2022   Procedure: ESOPHAGOGASTRODUODENOSCOPY (EGD) WITH PROPOFOL ;  Surgeon: Jinny Carmine, MD;  Location: [facility] ENDOSCOPY;  Service: Endoscopy;  Laterality: N/A;   FRACTURE SURGERY Left 1982   foot, leg.unsure if metal in these parts.possibly replaced ankle   GIVENS CAPSULE STUDY N/A 10/03/2022   Procedure: GIVENS CAPSULE STUDY;  Surgeon: Kung Ruel, MD;  Location: [facility] ENDOSCOPY;  Service: Gastroenterology;  Laterality: N/A;   INSERTION OF ILIAC STENT Bilateral 09/10/2022   Procedure: INSERTION OF ILIAC STENT;  Surgeon: Marea Selinda RAMAN, MD;  Location: [facility] ORS;  Service: Vascular;  Laterality: Bilateral;   LOWER EXTREMITY ANGIOGRAPHY Right 09/04/2022   Procedure: Lower Extremity Angiography;  Surgeon: Marea Selinda RAMAN, MD;  Location: [facility];  Service: Cardiovascular;  Laterality: Right;   LOWER EXTREMITY ANGIOGRAPHY Right 01/12/2023   Procedure: Lower Extremity Angiography;  Surgeon: Marea Selinda RAMAN, MD;  Location: [facility];  Service: Cardiovascular;  Laterality: Right;   PERIPHERAL VASCULAR CATHETERIZATION Left 12/27/2015   Procedure: Lower Extremity Angiography;  Surgeon: Selinda RAMAN Marea, MD;  Location: [facility] CV [facility];  Service: Cardiovascular;  Laterality: Left;    Family History  Problem Relation Age of Onset   Cancer Mother    Heart disease Mother    Hyperlipidemia Mother    Heart disease Father    Hyperlipidemia Father     Allergies  Allergen  Reactions   Amlodipine  Itching       Latest Ref Rng & Units 01/12/2023    1:42 PM 10/16/2022    4:45 AM 10/14/2022    7:47 AM  CBC  WBC 4.0 - 10.5 K/uL 7.8  6.1  6.1    Hemoglobin 13.0 - 17.0 g/dL 89.1  7.7  7.8   Hematocrit 39.0 - 52.0 % 34.0  24.0  24.2   Platelets 150 - 400 K/uL 362  263  249       CMP     Component Value Date/Time   NA 138 10/07/2022 0548   NA 134 06/18/2020 1556   K 4.5 10/07/2022 0548   CL 105 10/07/2022 0548   CO2 25 10/07/2022 0548   GLUCOSE 134 (H) 10/07/2022 0548   BUN 28 (H) 01/12/2023 1303   BUN 21 06/18/2020 1556   CREATININE 1.19 01/12/2023 1303   CALCIUM  8.5 (L) 10/07/2022 0548   PROT 7.1 09/29/2022 1827   PROT 7.5 09/26/2019 1603   ALBUMIN 3.8 09/29/2022 1827   ALBUMIN 4.6 09/26/2019 1603   AST 46 (H) 09/29/2022 1827   AST 36 02/18/2018 1528   ALT 97 (H) 09/29/2022 1827   ALT 44 02/18/2018 1528   ALKPHOS 91 09/29/2022 1827   BILITOT 0.7 09/29/2022 1827   BILITOT <0.2 09/26/2019 1603   GFRNONAA >60 01/12/2023 1303     No results found.     Assessment & Plan:   1. Peripheral arterial disease with history of revascularization (Primary) Patient is now postoperative from 09/10/2022 from bilateral femoral endarterectomies.  Patient had a left common femoral artery and SFA occlusions as well as right common femoral artery with profunda femoris occlusions that were revascularized.  He also had bilateral iliac stents placed at same time.  Patient had difficulty healing both of those incisions left greater than right.  They are well-healed today without any complications to note.  No hematoma seroma or infections to note today on follow-up.  Patient also underwent on 01/12/2023 right lower extremity angiogram which she had an extension of previous stent placement to the right SFA completed.  Patient was last seen by us  on 01/12/2023.  He returns today for follow-up and bilateral lower extremity evaluation.  2. Atherosclerosis of native artery of right lower extremity with rest pain (HCC) Recommend:  The patient has experienced increased claudication symptoms and is now describing lifestyle limiting claudication and  appears to be having continuous rest pain symptroms. Patient also noted to have bilateral lower extremity swelling from his thighs to his feet with numbness to his inner thighs bilaterally.    Given the severity of the patient's severe right lower extremity symptoms the patient should undergo angiography with the hope for intervention.  Risk and benefits were reviewed the patient.  Indications for the procedure were reviewed.  All questions were answered, the patient agrees to proceed with right lower extremity angiography and possible intervention.   The patient should continue walking and begin a more formal exercise program.  The patient should continue antiplatelet therapy and aggressive treatment of the lipid abnormalities  The patient will follow up with me after the angiogram.   3. Hyperlipidemia, unspecified hyperlipidemia type Continue statin as ordered and reviewed, no changes at this time  4. Hypertension associated with diabetes (HCC) Continue antihypertensive medications as already ordered, these medications have been reviewed and there are no changes at this time.  5. Type 2 diabetes mellitus with diabetic peripheral angiopathy without gangrene, unspecified whether  long term insulin  use (HCC) Continue hypoglycemic medications as already ordered, these medications have been reviewed and there are no changes at this time.  Hgb A1C to be monitored as already arranged by primary service   Current Outpatient Medications on File Prior to Visit  Medication Sig Dispense Refill   aspirin  EC 81 MG tablet Take 81 mg by mouth every other day.     atorvastatin  (LIPITOR) 40 MG tablet Take 40 mg by mouth daily.     carvedilol  (COREG ) 6.25 MG tablet Take 3.125 mg by mouth 2 (two) times daily.     chlorthalidone  (HYGROTON ) 50 MG tablet Take 1 tablet (50 mg total) by mouth daily. MUST BE SEEN IN CLINIC FOR FURTHER REFILLS ON MEDICATION 30 tablet 0   clopidogrel  (PLAVIX ) 75 MG tablet Take 1  tablet (75 mg total) by mouth daily. 90 tablet 3   clopidogrel  (PLAVIX ) 75 MG tablet Take 1 tablet (75 mg total) by mouth daily. 30 tablet 11   empagliflozin  (JARDIANCE ) 10 MG TABS tablet Take 10 mg by mouth daily.     folic acid  (FOLVITE ) 1 MG tablet Take 1 tablet (1 mg total) by mouth daily. (Patient taking differently: Take 0.5 mg by mouth daily.)     gabapentin  (NEURONTIN ) 400 MG capsule Take 800 mg by mouth at bedtime.     glipiZIDE  (GLUCOTROL  XL) 10 MG 24 hr tablet Take 20 mg by mouth daily.     insulin  degludec (TRESIBA ) 100 UNIT/ML FlexTouch Pen Inject 30 Units into the skin at bedtime.     Omega-3 Fatty Acids (FISH OIL) 1000 MG CAPS Take 1,000 mg by mouth daily.     spironolactone  (ALDACTONE ) 25 MG tablet Take 25 mg by mouth daily.     terazosin  (HYTRIN ) 2 MG capsule Take 2 mg by mouth at bedtime.     thiamine  (VITAMIN B-1) 100 MG tablet Take 1 tablet (100 mg total) by mouth daily.     valsartan  (DIOVAN ) 320 MG tablet Take 160 mg by mouth daily.     albuterol  (VENTOLIN  HFA) 108 (90 Base) MCG/ACT inhaler Inhale 1-2 puffs into the lungs every 6 (six) hours as needed for wheezing or shortness of breath. (Patient not taking: Reported on 02/01/2024)     gabapentin  (NEURONTIN ) 100 MG capsule Take 100 mg by mouth 3 (three) times daily. (Patient not taking: Reported on 02/01/2024)     Multiple Vitamin (MULTIVITAMIN WITH MINERALS) TABS tablet Take 1 tablet by mouth daily. (Patient not taking: Reported on 02/01/2024)     nicotine  (NICODERM CQ  - DOSED IN MG/24 HOURS) 21 mg/24hr patch Place 1 patch (21 mg total) onto the skin daily as needed (nicotine  craving). (Patient not taking: Reported on 02/01/2024) 28 patch 0   oxyCODONE -acetaminophen  (PERCOCET/ROXICET) 5-325 MG tablet Take 1 tablet by mouth every 4 (four) hours as needed for severe pain. (Patient not taking: Reported on 02/01/2024)     polyethylene glycol (MIRALAX  / GLYCOLAX ) 17 g packet Take 17 g by mouth 2 (two) times daily. (Patient not taking:  Reported on 02/01/2024) 14 each 0   senna-docusate (SENOKOT-S) 8.6-50 MG tablet Take 2 tablets by mouth 2 (two) times daily. (Patient not taking: Reported on 02/01/2024)     silver  sulfADIAZINE  (SILVADENE ) 1 % cream Apply topically 2 (two) times daily. (Patient not taking: Reported on 02/01/2024) 50 g 0   Current Facility-Administered Medications on File Prior to Visit  Medication Dose Route Frequency Provider Last Rate Last Admin   iodixanol  (VISIPAQUE ) 320 MG/ML injection  PRN Dew, Jason S, MD   45 mL at 01/12/23 1416    There are no Patient Instructions on file for this visit. No follow-ups on file.   Gwendlyn JONELLE Shank, NP

## 2024-02-02 ENCOUNTER — Telehealth (INDEPENDENT_AMBULATORY_CARE_PROVIDER_SITE_OTHER): Payer: Self-pay

## 2024-02-02 LAB — VAS US ABI WITH/WO TBI
Left ABI: 0.95
Right ABI: 0.78

## 2024-02-02 NOTE — Telephone Encounter (Signed)
 Spoke with the patient's spouse and he is scheduled with Dr. Marea for a RLE angio on 02/04/24 with a 6:45 am arrival time to the [facility]. Pre-procedure instructions were discussed and will be sent to Mychart and mailed

## 2024-02-04 ENCOUNTER — Encounter
Admission: RE | Disposition: A | Discharge status: Home / Self Care | Payer: Self-pay | Source: Home / Self Care | Attending: Vascular Surgery

## 2024-02-04 ENCOUNTER — Other Ambulatory Visit: Payer: Self-pay

## 2024-02-04 ENCOUNTER — Other Ambulatory Visit (INDEPENDENT_AMBULATORY_CARE_PROVIDER_SITE_OTHER): Payer: Self-pay | Admitting: Nurse Practitioner

## 2024-02-04 ENCOUNTER — Encounter: Payer: Self-pay | Admitting: Vascular Surgery

## 2024-02-04 ENCOUNTER — Ambulatory Visit
Admission: RE | Admit: 2024-02-04 | Discharge: 2024-02-04 | Disposition: A | Discharge status: Home / Self Care | Attending: Vascular Surgery | Admitting: Vascular Surgery

## 2024-02-04 DIAGNOSIS — E785 Hyperlipidemia, unspecified: Secondary | ICD-10-CM | POA: Diagnosis not present

## 2024-02-04 DIAGNOSIS — E1151 Type 2 diabetes mellitus with diabetic peripheral angiopathy without gangrene: Secondary | ICD-10-CM | POA: Insufficient documentation

## 2024-02-04 DIAGNOSIS — F17210 Nicotine dependence, cigarettes, uncomplicated: Secondary | ICD-10-CM | POA: Diagnosis not present

## 2024-02-04 DIAGNOSIS — Z7984 Long term (current) use of oral hypoglycemic drugs: Secondary | ICD-10-CM | POA: Diagnosis not present

## 2024-02-04 DIAGNOSIS — Z555 Less than a high school diploma: Secondary | ICD-10-CM | POA: Diagnosis not present

## 2024-02-04 DIAGNOSIS — I152 Hypertension secondary to endocrine disorders: Secondary | ICD-10-CM | POA: Diagnosis not present

## 2024-02-04 DIAGNOSIS — I70229 Atherosclerosis of native arteries of extremities with rest pain, unspecified extremity: Secondary | ICD-10-CM | POA: Diagnosis present

## 2024-02-04 DIAGNOSIS — Z794 Long term (current) use of insulin: Secondary | ICD-10-CM | POA: Diagnosis not present

## 2024-02-04 DIAGNOSIS — Z95828 Presence of other vascular implants and grafts: Secondary | ICD-10-CM

## 2024-02-04 DIAGNOSIS — I70221 Atherosclerosis of native arteries of extremities with rest pain, right leg: Secondary | ICD-10-CM | POA: Insufficient documentation

## 2024-02-04 DIAGNOSIS — Z9889 Other specified postprocedural states: Secondary | ICD-10-CM

## 2024-02-04 LAB — CREATININE, SERUM
Creatinine, Ser: 1.59 mg/dL — ABNORMAL HIGH (ref 0.61–1.24)
GFR, Estimated: 44 mL/min — ABNORMAL LOW

## 2024-02-04 LAB — GLUCOSE, CAPILLARY
Glucose-Capillary: 80 mg/dL (ref 70–99)
Glucose-Capillary: 94 mg/dL (ref 70–99)

## 2024-02-04 LAB — BUN: BUN: 24 mg/dL — ABNORMAL HIGH (ref 8–23)

## 2024-02-04 MED ORDER — ACETAMINOPHEN 325 MG PO TABS: 650.0000 mg | ORAL_TABLET | ORAL | Status: DC | PRN

## 2024-02-04 MED ORDER — HEPARIN SODIUM (PORCINE) 1000 UNIT/ML IJ SOLN
INTRAMUSCULAR | Status: DC | PRN
  Administered 2024-02-04: 5000 [IU] via INTRAVENOUS

## 2024-02-04 MED ORDER — SODIUM CHLORIDE 0.9 % IV SOLN: INTRAVENOUS | Status: DC

## 2024-02-04 MED ORDER — SODIUM CHLORIDE 0.9% FLUSH: 3.0000 mL | INTRAVENOUS | Status: DC | PRN

## 2024-02-04 MED ORDER — HYDRALAZINE HCL 20 MG/ML IJ SOLN
INTRAMUSCULAR | Status: DC | PRN
  Administered 2024-02-04 (×2): 10 mg via INTRAVENOUS

## 2024-02-04 MED ORDER — HEPARIN SODIUM (PORCINE) 1000 UNIT/ML IJ SOLN
INTRAMUSCULAR | Status: AC
  Filled 2024-02-04: qty 10

## 2024-02-04 MED ORDER — HYDRALAZINE HCL 20 MG/ML IJ SOLN
INTRAMUSCULAR | Status: AC
  Filled 2024-02-04: qty 1

## 2024-02-04 MED ORDER — FENTANYL CITRATE (PF) 100 MCG/2ML IJ SOLN
INTRAMUSCULAR | Status: AC
  Filled 2024-02-04: qty 2

## 2024-02-04 MED ORDER — METHYLPREDNISOLONE SODIUM SUCC 125 MG IJ SOLR: 125.0000 mg | Freq: Once | INTRAMUSCULAR | Status: DC | PRN

## 2024-02-04 MED ORDER — SODIUM CHLORIDE 0.9% FLUSH: 3.0000 mL | Freq: Two times a day (BID) | INTRAVENOUS | Status: DC

## 2024-02-04 MED ORDER — LABETALOL HCL 5 MG/ML IV SOLN
INTRAVENOUS | Status: AC
  Filled 2024-02-04: qty 4

## 2024-02-04 MED ORDER — MIDAZOLAM HCL 2 MG/2ML IJ SOLN
INTRAMUSCULAR | Status: DC | PRN
  Administered 2024-02-04: 1 mg via INTRAVENOUS
  Administered 2024-02-04: 2 mg via INTRAVENOUS

## 2024-02-04 MED ORDER — LABETALOL HCL 5 MG/ML IV SOLN: 10.0000 mg | INTRAVENOUS | Status: DC | PRN

## 2024-02-04 MED ORDER — FENTANYL CITRATE PF 50 MCG/ML IJ SOSY
PREFILLED_SYRINGE | INTRAMUSCULAR | Status: AC
  Filled 2024-02-04: qty 1

## 2024-02-04 MED ORDER — LABETALOL HCL 5 MG/ML IV SOLN
INTRAVENOUS | Status: DC | PRN
  Administered 2024-02-04: 10 mg via INTRAVENOUS

## 2024-02-04 MED ORDER — SODIUM CHLORIDE 0.9 % IV SOLN: 250.0000 mL | INTRAVENOUS | Status: DC | PRN

## 2024-02-04 MED ORDER — HYDROMORPHONE HCL 1 MG/ML IJ SOLN
1.0000 mg | Freq: Once | INTRAMUSCULAR | Status: AC | PRN
  Administered 2024-02-04: 0.5 mg via INTRAVENOUS

## 2024-02-04 MED ORDER — SODIUM CHLORIDE 0.9 % IV BOLUS: 250.0000 mL | Freq: Once | INTRAVENOUS | Status: DC

## 2024-02-04 MED ORDER — IODIXANOL 320 MG/ML IV SOLN
INTRAVENOUS | Status: DC | PRN
  Administered 2024-02-04: 45 mL

## 2024-02-04 MED ORDER — LIDOCAINE-EPINEPHRINE (PF) 1 %-1:200000 IJ SOLN
INTRAMUSCULAR | Status: DC | PRN
  Administered 2024-02-04: 10 mL

## 2024-02-04 MED ORDER — HYDRALAZINE HCL 20 MG/ML IJ SOLN
5.0000 mg | INTRAMUSCULAR | Status: AC | PRN
  Administered 2024-02-04 (×2): 5 mg via INTRAVENOUS

## 2024-02-04 MED ORDER — HEPARIN (PORCINE) IN NACL 1000-0.9 UT/500ML-% IV SOLN
INTRAVENOUS | Status: DC | PRN
  Administered 2024-02-04: 1000 mL

## 2024-02-04 MED ORDER — CEFAZOLIN SODIUM-DEXTROSE 2-4 GM/100ML-% IV SOLN
INTRAVENOUS | Status: AC
  Filled 2024-02-04: qty 100

## 2024-02-04 MED ORDER — CEFAZOLIN SODIUM-DEXTROSE 2-4 GM/100ML-% IV SOLN: 2.0000 g | INTRAVENOUS | Status: DC

## 2024-02-04 MED ORDER — ONDANSETRON HCL 4 MG/2ML IJ SOLN: 4.0000 mg | Freq: Four times a day (QID) | INTRAMUSCULAR | Status: DC | PRN

## 2024-02-04 MED ORDER — FENTANYL CITRATE (PF) 100 MCG/2ML IJ SOLN
INTRAMUSCULAR | Status: DC | PRN
  Administered 2024-02-04: 50 ug via INTRAVENOUS
  Administered 2024-02-04: 25 ug via INTRAVENOUS
  Administered 2024-02-04: 50 ug via INTRAVENOUS

## 2024-02-04 MED ORDER — DIPHENHYDRAMINE HCL 50 MG/ML IJ SOLN: 50.0000 mg | Freq: Once | INTRAMUSCULAR | Status: DC | PRN

## 2024-02-04 MED ORDER — MIDAZOLAM HCL 2 MG/2ML IJ SOLN
INTRAMUSCULAR | Status: AC
  Filled 2024-02-04: qty 4

## 2024-02-04 MED ORDER — HYDROMORPHONE HCL 1 MG/ML IJ SOLN
INTRAMUSCULAR | Status: AC
  Filled 2024-02-04: qty 0.5

## 2024-02-04 MED ORDER — FAMOTIDINE 20 MG PO TABS: 40.0000 mg | ORAL_TABLET | Freq: Once | ORAL | Status: DC | PRN

## 2024-02-04 MED ORDER — MIDAZOLAM HCL 2 MG/ML PO SYRP: 8.0000 mg | ORAL_SOLUTION | Freq: Once | ORAL | Status: DC | PRN

## 2024-02-04 MED ORDER — SODIUM CHLORIDE 0.9 % IV SOLN: INTRAVENOUS | Status: AC

## 2024-02-04 SURGICAL SUPPLY — 1 items: NDL ENTRY 21GA 7CM ECHOTIP (NEEDLE) IMPLANT

## 2024-02-04 NOTE — Progress Notes (Signed)
 Patient with family members present with discharge instructions given/questions answered. No bleeding nor hematoma at left groin, small amount of bruising noted from earlier post procedure. Denies complaints at this time.

## 2024-02-04 NOTE — Op Note (Signed)
 [facility]  Percutaneous Study/Intervention Procedural Note   Date of Surgery: 02/04/2024  Surgeon(s):Damoni Causby    Assistants:none  Pre-operative Diagnosis: PAD with rest pain RLE  Post-operative diagnosis:  Same  Procedure(s) Performed:             1.  Ultrasound guidance for vascular access left femoral artery             2.  Catheter placement into right SFA from left femoral approach             3.  Aortogram and selective right lower extremity angiogram             4.  Percutaneous transluminal angioplasty of right popliteal artery with 5 mm diameter by 10 cm length Lutonix drug-coated angioplasty balloon             5.  StarClose closure device left femoral artery  EBL: 10 cc  Contrast: 45 cc  Fluoro Time: 2.6 minutes  Moderate Conscious Sedation Time: approximately 24 minutes using 3 mg of Versed  and 125 mcg of Fentanyl               Indications:  Patient is a 78 y.o.male with a long history of peripheral arterial disease status post previous extensive surgery and endovascular revascularization last year. The patient has noninvasive study showing reduced flow and he is complaining of symptoms concerning for ischemic rest pain. The patient is brought in for angiography for further evaluation and potential treatment.  Due to the limb threatening nature of the situation, angiogram was performed for attempted limb salvage. The patient is aware that if the procedure fails, amputation would be expected.  The patient also understands that even with successful revascularization, amputation may still be required due to the severity of the situation.  Risks and benefits are discussed and informed consent is obtained.   Procedure:  The patient was identified and appropriate procedural time out was performed.  The patient was then placed supine on the table and prepped and draped in the usual sterile fashion. Moderate conscious sedation was administered during a face  to face encounter with the patient throughout the procedure with my supervision of the RN administering medicines and monitoring the patient's vital signs, pulse oximetry, telemetry and mental status throughout from the start of the procedure until the patient was taken to the recovery room. Ultrasound was used to evaluate the left common femoral artery.  It was patent .  A digital ultrasound image was acquired.  A Seldinger needle was used to access the left common femoral artery under direct ultrasound guidance and a permanent image was performed.  A 0.035 J wire was advanced without resistance and a 5Fr sheath was placed.  Pigtail catheter was placed into the aorta and an AP aortogram was performed. This demonstrated normal renal arteries and normal aorta and iliac segments without significant stenosis in the previous iliac stents. I then crossed the aortic bifurcation and advanced to the right femoral head and then into the proximal right SFA. Selective right lower extremity angiogram was then performed. This demonstrated the previous femoral endarterectomy was widely patent.  The SFA including the previously placed stent was patent without significant stenosis.  There was a fairly focal stenosis in the above-knee popliteal artery in the 80 to 90% range.  The mid to distal popliteal artery normalized.  There was then three-vessel runoff distally with only mild tibial disease. It was felt that it was in the patient's best  interest to proceed with intervention after these images to avoid a second procedure and a larger amount of contrast and fluoroscopy based off of the findings from the initial angiogram. The patient was systemically heparinized and a 6 [place] Ansell sheath was then placed over the [employer] wire. I then used a Kumpe catheter and the advantage wire to easily cross the popliteal stenosis.  I then proceeded with angioplasty and a 5 mm diameter by 10 cm length Lutonix drug-coated angioplasty  balloon was inflated to 12 atm for 1 minute.  Completion imaging showed marked improvement with only about a 10 to 15% residual stenosis after angioplasty. I elected to terminate the procedure. The sheath was removed and StarClose closure device was deployed in the left femoral artery with excellent hemostatic result. The patient was taken to the recovery room in stable condition having tolerated the procedure well.  Findings:               Aortogram:  This demonstrated normal renal arteries and normal aorta and iliac segments without significant stenosis in the previous iliac stents             Right Lower Extremity:  This demonstrated the previous femoral endarterectomy was widely patent.  The SFA including the previously placed stent was patent without significant stenosis.  There was a fairly focal stenosis in the above-knee popliteal artery in the 80 to 90% range.  The mid to distal popliteal artery normalized.  There was then three-vessel runoff distally with only mild tibial disease.   Disposition: Patient was taken to the recovery room in stable condition having tolerated the procedure well.  Complications: None  Selinda Gu 02/04/2024 9:18 AM   This note was created with Dragon Medical transcription system. Any errors in dictation are purely unintentional.

## 2024-02-04 NOTE — Interval H&P Note (Signed)
 History and Physical Interval Note:  02/04/2024 8:37 AM  William Ball  has presented today for surgery, with the diagnosis of RLE Angio   ASO w rest pain.  The various methods of treatment have been discussed with the patient and family. After consideration of risks, benefits and other options for treatment, the patient has consented to  Procedure(s): Lower Extremity Angiography (Right) as a surgical intervention.  The patient's history has been reviewed, patient examined, no change in status, stable for surgery.  I have reviewed the patient's chart and labs.  Questions were answered to the patient's satisfaction.     [name]

## 2024-02-04 NOTE — Discharge Instructions (Signed)

## 2024-02-05 ENCOUNTER — Encounter: Payer: Self-pay | Admitting: Vascular Surgery

## 2024-03-03 ENCOUNTER — Other Ambulatory Visit (INDEPENDENT_AMBULATORY_CARE_PROVIDER_SITE_OTHER): Payer: Self-pay | Admitting: Vascular Surgery

## 2024-03-03 DIAGNOSIS — Z9889 Other specified postprocedural states: Secondary | ICD-10-CM

## 2024-03-03 DIAGNOSIS — I739 Peripheral vascular disease, unspecified: Secondary | ICD-10-CM

## 2024-03-04 ENCOUNTER — Ambulatory Visit (INDEPENDENT_AMBULATORY_CARE_PROVIDER_SITE_OTHER): Admitting: Nurse Practitioner

## 2024-03-04 ENCOUNTER — Encounter (INDEPENDENT_AMBULATORY_CARE_PROVIDER_SITE_OTHER)

## 2024-05-03 ENCOUNTER — Other Ambulatory Visit (INDEPENDENT_AMBULATORY_CARE_PROVIDER_SITE_OTHER): Payer: Self-pay | Admitting: Nurse Practitioner

## 2024-05-26 ENCOUNTER — Other Ambulatory Visit: Payer: Self-pay

## 2024-05-26 DIAGNOSIS — K769 Liver disease, unspecified: Secondary | ICD-10-CM

## 2024-06-03 ENCOUNTER — Ambulatory Visit: Admission: RE | Admit: 2024-06-03 | Discharge: 2024-06-03 | Disposition: A | Source: Ambulatory Visit

## 2024-06-03 DIAGNOSIS — K769 Liver disease, unspecified: Secondary | ICD-10-CM | POA: Insufficient documentation

## 2024-06-03 MED ORDER — GADOBUTROL 1 MMOL/ML IV SOLN
9.0000 mL | Freq: Once | INTRAVENOUS | Status: AC | PRN
  Administered 2024-06-03: 9 mL via INTRAVENOUS
